# Patient Record
Sex: Female | Born: 1940
Health system: Southern US, Community
[De-identification: ages and names within clinical notes are randomized; demographics above are authoritative.]

## PROBLEM LIST (undated history)

## (undated) DIAGNOSIS — I499 Cardiac arrhythmia, unspecified: Secondary | ICD-10-CM

## (undated) DIAGNOSIS — I5043 Acute on chronic combined systolic (congestive) and diastolic (congestive) heart failure: Secondary | ICD-10-CM

## (undated) DIAGNOSIS — I420 Dilated cardiomyopathy: Secondary | ICD-10-CM

## (undated) DIAGNOSIS — I4891 Unspecified atrial fibrillation: Secondary | ICD-10-CM

## (undated) DIAGNOSIS — I6529 Occlusion and stenosis of unspecified carotid artery: Secondary | ICD-10-CM

## (undated) DIAGNOSIS — E785 Hyperlipidemia, unspecified: Secondary | ICD-10-CM

## (undated) HISTORY — DX: Cardiac arrhythmia, unspecified: I49.9

## (undated) HISTORY — PX: ABDOMINAL HERNIA REPAIR: SHX539

## (undated) HISTORY — DX: Unspecified atrial fibrillation: I48.91

## (undated) HISTORY — PX: CATARACT EXTRACTION: SUR2

## (undated) HISTORY — DX: Dilated cardiomyopathy: I42.0

## (undated) HISTORY — DX: Acute on chronic combined systolic (congestive) and diastolic (congestive) heart failure: I50.43

## (undated) HISTORY — DX: Hyperlipidemia, unspecified: E78.5

## (undated) HISTORY — DX: Occlusion and stenosis of unspecified carotid artery: I65.29

---

## 1999-05-15 ENCOUNTER — Emergency Department (HOSPITAL_COMMUNITY): Admission: EM | Admit: 1999-05-15 | Discharge: 1999-05-16 | Payer: Self-pay

## 2006-03-23 ENCOUNTER — Encounter (INDEPENDENT_AMBULATORY_CARE_PROVIDER_SITE_OTHER): Payer: Self-pay | Admitting: Interventional Cardiology

## 2006-03-23 ENCOUNTER — Ambulatory Visit (HOSPITAL_COMMUNITY): Admission: RE | Admit: 2006-03-23 | Discharge: 2006-03-23 | Payer: Self-pay | Admitting: Interventional Cardiology

## 2006-05-31 ENCOUNTER — Encounter: Admission: RE | Admit: 2006-05-31 | Discharge: 2006-05-31 | Payer: Self-pay | Admitting: *Deleted

## 2007-03-07 ENCOUNTER — Ambulatory Visit: Payer: Self-pay | Admitting: *Deleted

## 2007-04-15 ENCOUNTER — Ambulatory Visit: Admission: RE | Admit: 2007-04-15 | Discharge: 2007-04-15 | Payer: Self-pay | Admitting: *Deleted

## 2007-05-08 ENCOUNTER — Inpatient Hospital Stay (HOSPITAL_COMMUNITY): Admission: RE | Admit: 2007-05-08 | Discharge: 2007-05-09 | Payer: Self-pay | Admitting: *Deleted

## 2007-05-08 ENCOUNTER — Encounter (INDEPENDENT_AMBULATORY_CARE_PROVIDER_SITE_OTHER): Payer: Self-pay | Admitting: *Deleted

## 2007-05-08 ENCOUNTER — Ambulatory Visit: Payer: Self-pay | Admitting: *Deleted

## 2007-05-08 HISTORY — PX: CAROTID ENDARTERECTOMY: SUR193

## 2007-05-23 ENCOUNTER — Ambulatory Visit: Payer: Self-pay | Admitting: *Deleted

## 2007-12-26 ENCOUNTER — Ambulatory Visit: Payer: Self-pay | Admitting: *Deleted

## 2008-06-11 ENCOUNTER — Ambulatory Visit: Payer: Self-pay | Admitting: *Deleted

## 2009-06-13 IMAGING — CR DG CHEST 2V
2 series · 2 of 2 positions shown · non-contrast
Comparison: No prior.

CLINICAL DATA: Preop for left carotid endarterectomy.  Hypertension.  Diabetes.
 CHEST ? 2 VIEW:

[view not recorded (1 of 2)]
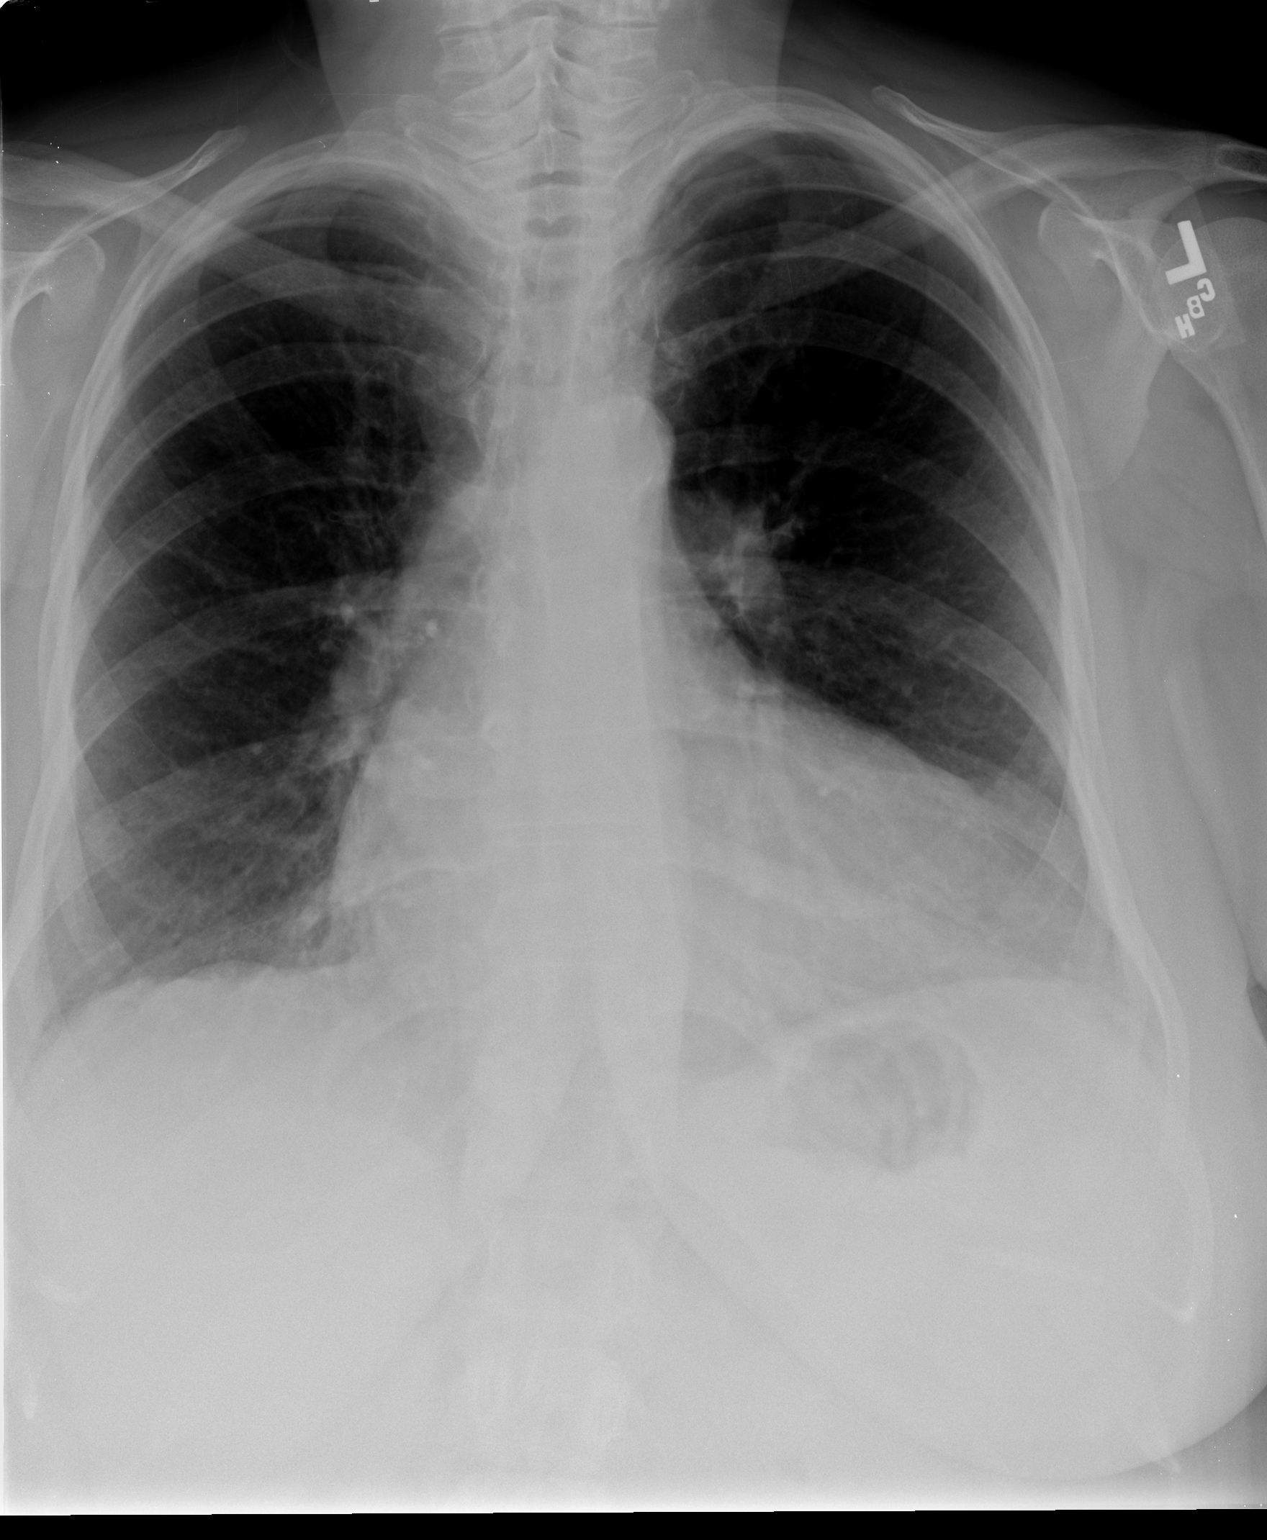

[view not recorded (2 of 2)]
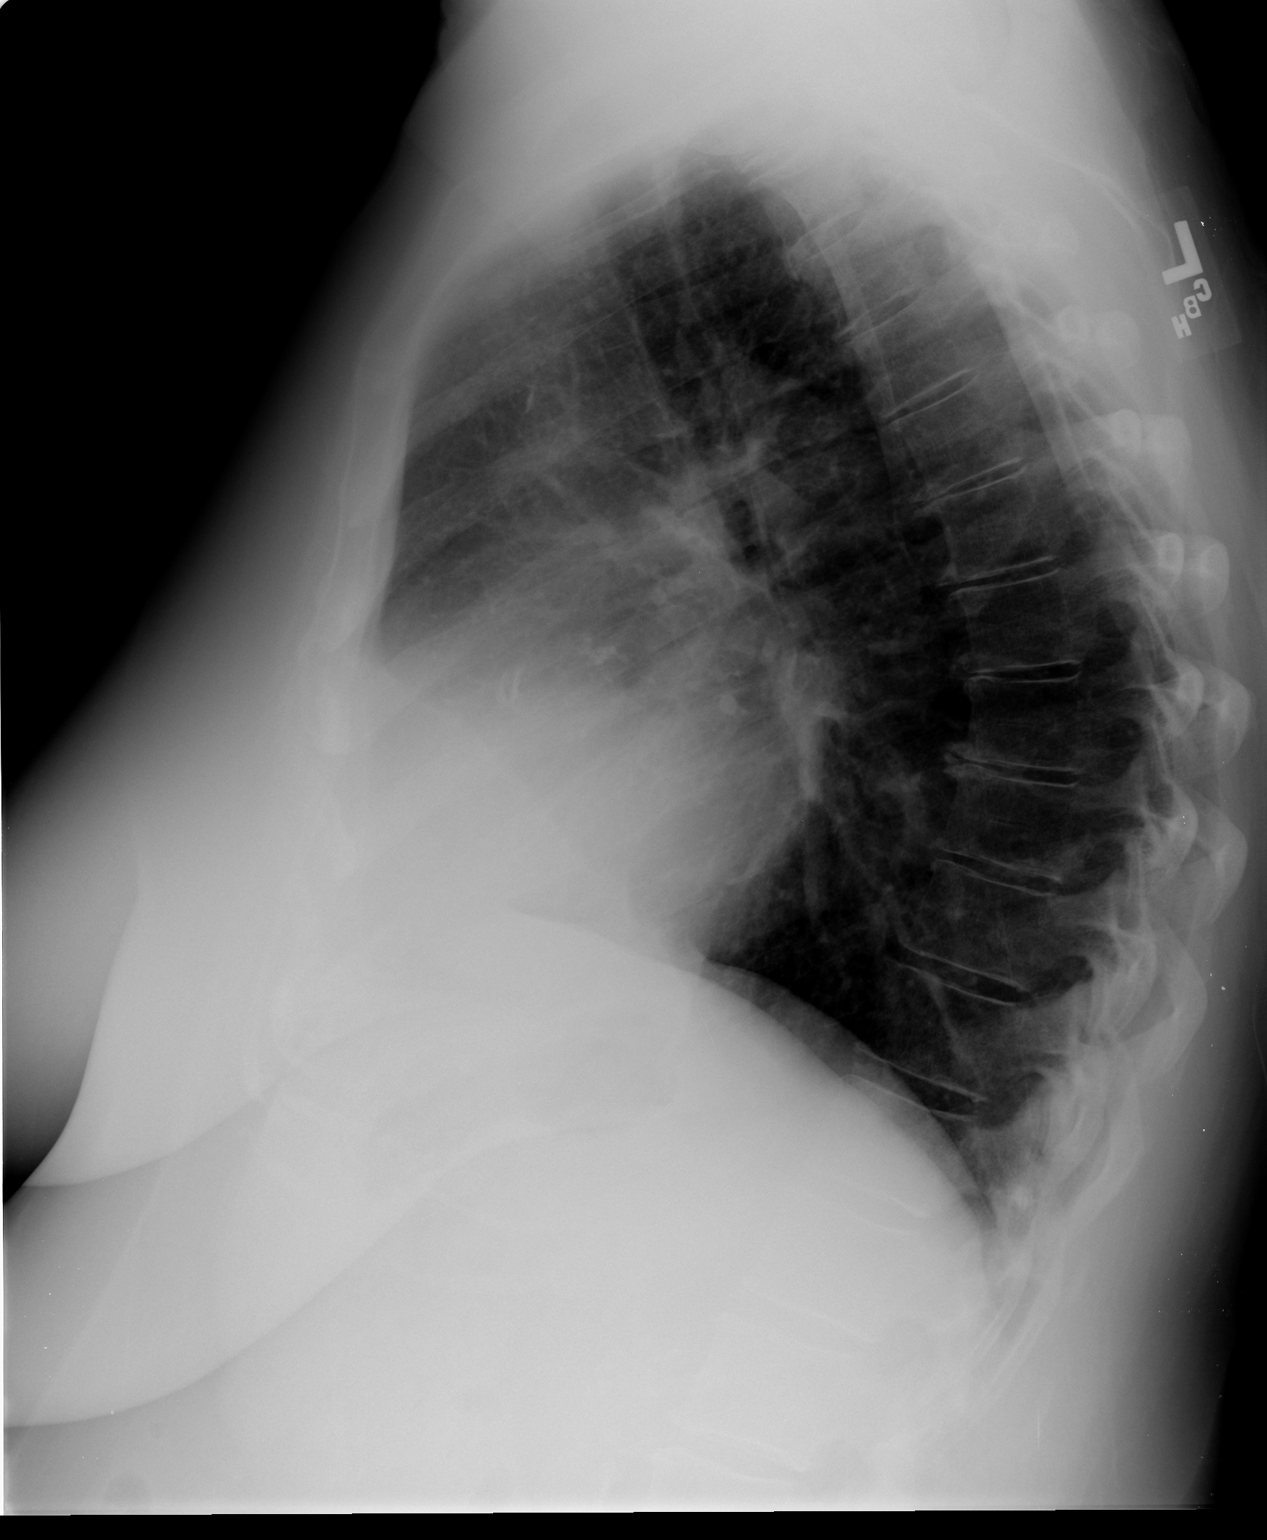

[2 of 2 positions shown; findings below may reference images not displayed]

FINDINGS: Two views of the chest show the lungs to be clear.  Moderate cardiomegaly is present.  No acute bony abnormality is seen.
IMPRESSION: 1. Moderate cardiomegaly.
 2. No active lung disease.

## 2009-07-02 ENCOUNTER — Ambulatory Visit: Payer: Self-pay | Admitting: Vascular Surgery

## 2009-12-26 ENCOUNTER — Emergency Department (HOSPITAL_COMMUNITY)
Admission: EM | Admit: 2009-12-26 | Discharge: 2009-12-26 | Payer: Self-pay | Source: Home / Self Care | Admitting: Emergency Medicine

## 2010-02-03 ENCOUNTER — Encounter: Admission: RE | Admit: 2010-02-03 | Discharge: 2010-02-03 | Payer: Self-pay | Admitting: Orthopedic Surgery

## 2010-07-01 ENCOUNTER — Ambulatory Visit: Admit: 2010-07-01 | Payer: Self-pay

## 2010-07-01 ENCOUNTER — Ambulatory Visit: Admit: 2010-07-01 | Payer: Self-pay | Admitting: Vascular Surgery

## 2010-07-28 ENCOUNTER — Other Ambulatory Visit (INDEPENDENT_AMBULATORY_CARE_PROVIDER_SITE_OTHER): Payer: PRIVATE HEALTH INSURANCE

## 2010-07-28 ENCOUNTER — Ambulatory Visit (INDEPENDENT_AMBULATORY_CARE_PROVIDER_SITE_OTHER): Payer: PRIVATE HEALTH INSURANCE

## 2010-07-28 DIAGNOSIS — I6529 Occlusion and stenosis of unspecified carotid artery: Secondary | ICD-10-CM

## 2010-07-28 DIAGNOSIS — Z48812 Encounter for surgical aftercare following surgery on the circulatory system: Secondary | ICD-10-CM
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
## 2010-08-02 NOTE — Procedures (Unsigned)
CAROTID DUPLEX EXAM  INDICATION:  Carotid disease.  HISTORY: Diabetes:  Yes. Cardiac:  No. Hypertension:  Yes. Smoking:  Yes. Previous Surgery:  Left carotid endarterectomy, 05/08/2007. CV History:  Asymptomatic. Amaurosis Fugax No, Paresthesias No, Hemiparesis No.                                      RIGHT             LEFT Brachial systolic pressure:         140               143 Brachial Doppler waveforms:         Normal            Normal Vertebral direction of flow:        Antegrade         Antegrade DUPLEX VELOCITIES (cm/sec) CCA peak systolic                   67                69 ECA peak systolic                   110               96 ICA peak systolic                   161               53 ICA end diastolic                   41                24 PLAQUE MORPHOLOGY:                  Calcific          Heterogenous PLAQUE AMOUNT:                      Moderate          Mild PLAQUE LOCATION:                    ICA, ECA, CCA     ICA, CCA  IMPRESSION: 1. Right internal carotid artery velocities suggest 40% to 59%     stenosis. 2. Patent left carotid endarterectomy site with no internal carotid     artery stenosis. 3. No significant change from the previous examination.  ___________________________________________ Rosetta Posner, M.D.  EM/MEDQ  D:  07/28/2010  T:  07/28/2010  Job:  [id number]

## 2010-08-20 LAB — APTT: aPTT: 42 seconds — ABNORMAL HIGH (ref 24–37)

## 2010-08-20 LAB — POCT I-STAT, CHEM 8
BUN: 9 mg/dL (ref 6–23)
Calcium, Ion: 1.12 mmol/L (ref 1.12–1.32)
TCO2: 28 mmol/L (ref 0–100)

## 2010-08-20 LAB — PROTIME-INR: INR: 1.94 — ABNORMAL HIGH (ref 0.00–1.49)

## 2010-10-18 NOTE — Assessment & Plan Note (Signed)
OFFICE VISIT   Cynthia Hansen, Cynthia Hansen  DOB:  1940/08/23                                       05/23/2007  [id number]   The patient is post-op left carotid endarterectomy carried out  05/08/2007 [facility].  Doing well without major complaints.  Mild numbness in the left side of her neck around her incision.  Occasional left-sided headache.  No visual disturbance.  No sensory  motor deficit.  Swallowing fine.   BP 139/72, pulse 91 per minute and irregular.   Cranial nerves intact.  Strength equal bilaterally.  No carotid bruits.   Moderate disease noted on the right side by Doppler.  Plan to return in  6 months for a repeat Doppler.   Dorothea Glassman, M.D.  Electronically Signed   PGH/MEDQ  D:  05/23/2007  T:  05/24/2007  Job:  [id number]   cc:   Jettie Booze, MD  Shaune Pollack, NP

## 2010-10-18 NOTE — Procedures (Signed)
CAROTID DUPLEX EXAM   INDICATION:  Followup evaluation of known carotid artery disease.   HISTORY:  Diabetes:  Yes.  Cardiac:  Yes.  Hypertension:  Yes.  Smoking:  The patient rarely smokes.  Previous Surgery:  Left carotid endarterectomy with Dacron patch  angioplasty on 05/08/2007 by Dr. Amedeo Plenty.  CV History:  The patient claims to have trigeminal neuralgia and also  reports some residual left facial numbness.  Amaurosis Fugax No, Paresthesias Yes, Hemiparesis No                                       RIGHT             LEFT  Brachial systolic pressure:         138               140  Brachial Doppler waveforms:         Triphasic         Triphasic  Vertebral direction of flow:        Antegrade         Inaudible  DUPLEX VELOCITIES (cm/sec)  CCA peak systolic                   88                69  ECA peak systolic                   121               [id number]  ICA peak systolic                   146               59  ICA end diastolic                   45                16  PLAQUE MORPHOLOGY:                  Calcified         None  PLAQUE AMOUNT:                      Mild to moderate  None  PLAQUE LOCATION:                    Proximal ICA      None   IMPRESSION:  1. 40-59% right ICA stenosis.  2. No left ICA stenosis status post endarterectomy.  3. Occluded left vertebral artery.   ___________________________________________  P. Drucie Opitz, M.D.   MC/MEDQ  D:  12/26/2007  T:  12/26/2007  Job:  [id number]

## 2010-10-18 NOTE — Op Note (Signed)
NAMEMELANIA, Hansen NO.:  [id number]   MEDICAL RECORD NO.:  [id number]          PATIENT TYPE:  INP   LOCATION:  3302                         FACILITY:  [place]   PHYSICIAN:  Dorothea Glassman, M.D.    DATE OF BIRTH:  14-Aug-1940   DATE OF PROCEDURE:  05/08/2007  DATE OF DISCHARGE:                               OPERATIVE REPORT   SURGEON:  Dorothea Glassman, MD   ASSISTANT:  Jacinta Shoe, P.A.   ANESTHETIC:  General endotracheal.   PREOPERATIVE DIAGNOSIS:  Severe left internal carotid artery stenosis.   POSTOPERATIVE DIAGNOSIS:  Severe left internal carotid artery stenosis.   PROCEDURE:  Left carotid endarterectomy and Dacron patch angioplasty.   CLINICAL NOTE:  Cynthia Hansen is a 70 year old female with a history of  type 2 diabetes and tobacco use.  Known left carotid disease followed in  the [facility] office.  A recent Doppler revealed marked elevation of  velocities, the circuit system with greater than 80% left ICA stenosis.  The patient seen and evaluated, consented to left carotid endarterectomy  for reduction of stroke risk.  Risks and benefits of the operative  procedure explained in detail with a major morbidity and mortality of 1-  2% to include but not limited MI, CVA, cranial nerve injury and death.   OPERATIVE PROCEDURE:  The patient brought to the operating room in  stable hemodynamic condition.  Placed supine position.  General  endotracheal anesthesia induced.  A Foley catheter and arterial line in  place.  Left neck prepped and draped in a sterile fashion.   A left neck incision was made along the anterior border of the  sternomastoid muscle.  Dissection carried down through subcutaneous  tissue.  Platysma divided with electrocautery.  Deep dissection carried  along the anterior border of the sternomastoid down to the carotid  sheath.  The facial vein ligated with 2-0 silk and divided.  The carotid  bifurcation exposed.  Common carotid artery  mobilized down to the  omohyoid muscle and encircled with a vessel loop.  The vagus nerve  reflected posteriorly and preserved.  The superior thyroid and external  carotid were freed and encircled with vessel loops.  The internal  carotid artery followed distally.  There was extensive plaque in the  internal carotid artery, this extended well up into the neck.  This  extended above the level of the posterior belly of the digastric muscle  and also above the hypoglossal nerve, which was preserved and mobilized.  The distal internal carotid artery encircled with a vessel loop.   The patient administered 7000 units of heparin intravenously.  Adequate  circulation time permitted.  The carotid vessels were controlled with  clamps.  A longitudinal arteriotomy made in the distal common carotid  artery.  The arteriotomy extended across the carotid bulb and up into  the internal carotid artery.  A long internal carotid artery arteriotomy  was required to extend beyond the plaque.  There was a long tongue of  plaque extending posteriorly in the internal carotid artery.  A shunt  was inserted.  An endarterectomy elevator used to raise the plaque and the plaque  raised down into the common carotid artery, where it was divided  transversely with Potts scissors.  The plaque then raised up into the  bulb and the superior thyroid and external carotid were  endarterectomized using an eversion technique.  The distal internal  carotid artery plaque feathered out well.  Fragments of plaque were  removed with fine forceps.  The site irrigated with heparin saline and  dextran solution.  A patch angioplasty of the endarterectomy site was  carried out with a Finesse Dacron patch using running 6-0 Prolene  suture.  At completion of the patch angioplasty the shunt was removed,  all vessels well-flushed.  Clamps were removed, directing the initial  antegrade flow up the external carotid artery, following this  the  internal carotid was released.   Excellent pulse and Doppler signal in the distal internal carotid  artery.  The patient administered 50 mg of protamine intravenously.  Adequate hemostasis obtained.  Sponges and instrument counts correct.   Sternomastoid fascia closed with a running 2-0 Vicryl suture.  Platysma  closed with running 3-0 Vicryl suture.  Skin closed with 4-0 Monocryl.  Dermabond applied.   Anesthesia reversed the operating room.  The patient awakened readily.  Moved all extremities to command.  No apparent complications.  Transferred to the recovery room in stable condition.      Dorothea Glassman, M.D.  Electronically Signed     PGH/MEDQ  D:  05/08/2007  T:  05/09/2007  Job:  [id number]   cc:   Jettie Booze, MD

## 2010-10-18 NOTE — H&P (Signed)
NAMEODELL, BROCCOLI NO.:  [id number]   MEDICAL RECORD NO.:  [id number]          PATIENT TYPE:  INP   LOCATION:  2899                         FACILITY:  [place]   PHYSICIAN:  Dorothea Glassman, M.D.    DATE OF BIRTH:  1940-10-01   DATE OF ADMISSION:  05/08/2007  DATE OF DISCHARGE:                              HISTORY & PHYSICAL   CARDIOLOGIST:  Jettie Booze, MD.   PRIMARY CARE:  Shaune Pollack, nurse practitioner at [facility].   ADMISSION DIAGNOSIS:  Severe left internal carotid artery stenosis.   HISTORY OF PRESENT ILLNESS:  Cynthia Hansen is a 70 year old female with  known carotid artery disease, initially seen in consultation  approximately one year ago.  She has been followed in the office on a  six-month basis with carotid Doppler evaluation.  Most recent visit  revealed severe elevation of left internal carotid artery velocities at  413/128 cm/sec.  These were consistent with a greater-than-80% left ICA  stenosis.  Right internal carotid artery reveals a 40% to 59% stenosis.   The patient denies any symptoms of neurologic origin.  No sensorimotor  or visual deficit.  No speech problems.  No gait abnormality.  No  syncope or presyncope.   PAST MEDICAL HISTORY:  1. Paroxysmal atrial fibrillation with recent recurrence following      cardioversion in October 2007.  2. Type 2 diabetes.  3. Hypertension.  4. Hyperlipidemia.  5. Tobacco abuse.   MEDICATIONS:  1. Coumadin 1.25 mg on Monday, Wednesday, Friday, Saturday; 2.5 mg on      Tuesday, Thursday, Sunday.  2. Metformin 1 g b.i.d.  3. Cartia XT 180 mg daily.  4. Zetia 10 mg daily.  5. Crestor 10 mg daily.   ALLERGIES:  NONE KNOWN   SOCIAL HISTORY:  The patient is married with one child.  She works as a  Research scientist (physical sciences).  She smokes one pack cigarettes daily.  Does not consume  alcohol on a regular basis.   FAMILY HISTORY:  Father died at age 18 of an MI with a history  of  hyperlipidemia.   REVIEW OF SYSTEMS:  The patient denies recent chest pain or shortness of  breath.  She has had palpitations and irregular heartbeat.  Notes no  cough or sputum production.  No recent weight loss.  No anorexia,  nausea, vomiting.  No diarrhea or constipation.  No dysuria or  frequency.  No joint abnormalities.  No skin rashes or ulcers.   PHYSICAL EXAMINATION:  GENERAL:  Moderately obese, 70 year old female.  Alert and oriented.  No acute distress.  VITAL SIGNS:  BP 150/80, pulse 70 per minute and irregular, respirations  rate 10 per minute, temperature 98.7.  HEENT:  Mouth and throat clear.  Normocephalic.  NECK:  Supple.  No thyromegaly or adenopathy.  CHEST:  Clear with equal air entry bilaterally.  CARDIOVASCULAR:  Left carotid bruit.  Normal heart sounds without  murmurs.  Irregular rhythm.  ABDOMEN:  Soft and nontender.  No mass or  organomegaly.  Normal bowel sounds, no bruits.  No tenderness.  NEUROLOGIC:  Cranial nerves intact.  Strength equal bilaterally.  There  are 2+ reflexes.  SKIN:  No rashes or ulceration.   IMPRESSION:  1. Severe asymptomatic left internal carotid artery stenosis with      recent progression.  2. Hypertension.  3. Hyperlipidemia  4. Type 2 diabetes.  5. Paroxysmal atrial fibrillation.  6. Tobacco abuse.   PLAN:  The patient will be admitted to the hospital electively on  May 08, 2007, for left carotid endarterectomy.  The risks and  benefits of the operative procedure were explained to the patient in  detail, as well as that major morbidity and mortality associated with  this procedure was 1% to 2%, to include but not limited to MI, CVA,  cranial nerve injury, and death.      Dorothea Glassman, M.D.  Electronically Signed     PGH/MEDQ  D:  05/08/2007  T:  05/08/2007  Job:  [id number]   cc:   Jettie Booze, MD  Shaune Pollack, nurse practitioner

## 2010-10-18 NOTE — Discharge Summary (Signed)
[name], [name]              ACCOUNT NO.:  [id number]   MEDICAL RECORD NO.:  [id number]          PATIENT TYPE:  INP   LOCATION:  3302                         FACILITY:  [facility]   PHYSICIAN:  Dorothea Glassman, M.D.    DATE OF BIRTH:  Mar 20, 1941   DATE OF ADMISSION:  05/08/2007  DATE OF DISCHARGE:  05/09/2007                               DISCHARGE SUMMARY   ADMISSION DIAGNOSIS:  Severe left internal carotid arterial stenosis.   DISCHARGE DIAGNOSIS:  1. Severe left internal carotid arterial stenosis, asymptomatic.  2. Paroxysmal atrial fibrillation with recent recurrence following      cardioversion in October 2007.  3. Type 2 diabetes.  4. Hypertension.  5. Hyperlipidemia.  6. Tobacco use.   CONSENT:  None.   PROCEDURES:  Left carotid endarterectomy with Dacron patch done by Dr.  Amedeo Plenty on May 08, 2007.   BRIEF HISTORY:  Cynthia Hansen is a 70 year old female with known  coronary artery disease, initially seen in consultation approximately 1  year ago.  She has been followed in the office on a 75-month basis with  carotid Doppler evaluation.  Most recent visit revealed severe elevation  of left internal carotid artery velocities of at 413/128 cm/sec.  These  were consistent with a greater than 80% left internal carotid stenosis.  Right internal carotid artery reveals a 40 to 59% stenosis.  The patient  denied any symptoms in neurological origin.  No sensory, motor or visual  defect.  No speech problems.  No gait abnormality.  No syncope or  presyncope. The plan was for the patient to be admitted to the hospital  electively on May 08, 2007, for left carotid endarterectomy. The  risks and benefits of the operative procedure was explained to the  patient in detail as well as the majority morbidity and mortality  associated with the procedure was 1 to 2% to include but not limited to  MI, CVA, cranial nerve injury and death.  The patient agreed and  understood these risks  and wanted to proceed with the procedure. The  patient signed consent and went through with the procedure.  The patient  tolerated the procedure.   HOSPITAL COURSE:  On May 08, 2007, the patient was taken to the  operating room to repair her left carotid stenosis.  The patient  tolerated the procedure well and was transferred to PACU in stable  condition.  After the patient remained in PACU for several hours, the  patient as transferred to 3300 where she continued to remain stable  overnight.  Postoperative day 2 on May 09, 2007, the patient  continued to remain stable.  Her vitals were 157/69, heart rate was 94,  respirations 17, temperature 98.3, all labs within normal range.  The  patient had no complaints.  On physical examination per Dr. Amedeo Plenty, the  patient was alert and oriented.  There was no left-sided weakness.  Neurologically was intact and the plan to have the patient discharged  home later today. The patient agreed with this plan.   DISCHARGE PLAN:  Plan is indicated for discharge of May 10, 2007, as  long as the patient continues to remain afebrile and currently vitals  stable.  The patient is in agreement with this plan to go home today.   DISCHARGE MEDICATIONS:  1. Coumadin as directed.  2. Aspirin 81 mg daily.  3. Zetia 10 mg daily.  4. Metformin 1000 mg daily times 2,  5. Actos 15 mg daily.  6. Metoprolol 50 mg daily.  7. Crestor 10 mg daily.  8. Pravastatin 20 mg daily.  9. Oxycodone 5 gm 1 to 2 tablets every 4 hours as needed for pain.   DISCHARGE FOLLOWUP:  The patient is to be discharged on May 09, 2007, and follow up in office with Dr. Amedeo Plenty in 2 weeks.  The patient is  to continue to follow a diabetic diet.  The patient is to clean  incisions gently with soap and water. The patient is instructed to  increase activity slowly.  May shower or bathe on May 10, 2007.  The  patient is also encouraged to do no lifting or driving for 2 weeks.   The  patient is to call office or ER if fever greater than 101 any redness,  drainage from incision site, severe headache, visual or speech change.  The patient has been instructed to continue medications per home  medication list including Coumadin.  The patient is also directed that  have oxycodone 5 mg 1 to 2 tablets every 4 hours as needed for pain.  The patient agreed to these follow-up care instructions and will be  discharged to home on May 10, 2007, as long as she continued to  remain stable.   Discharge on May 10, 2007, indicated as long as the patient  continues to remain stable and afebrile.      Darlin Coco, PA      P. Drucie Opitz, M.D.  Electronically Signed    ALW/MEDQ  D:  05/09/2007  T:  05/09/2007  Job:  [id number]

## 2010-10-18 NOTE — Procedures (Signed)
CAROTID DUPLEX EXAM   INDICATION:  Follow up known carotid artery disease, per CT.   HISTORY:  Diabetes:  Yes.  Cardiac:  Yes.  Hypertension:  Yes.  Smoking:  Yes.  Previous Surgery:  CV History:  Amaurosis Fugax Yes No, Paresthesias Yes No, Hemiparesis Yes No                                       RIGHT             LEFT  Brachial systolic pressure:         150               148  Brachial Doppler waveforms:         Biphasic          Biphasic  Vertebral direction of flow:        Antegrade         Not well  visualized  DUPLEX VELOCITIES (cm/sec)  CCA peak systolic                   114               58  ECA peak systolic                   131               [id number]  ICA peak systolic                   182               [id number]  ICA end diastolic                   49                128  PLAQUE MORPHOLOGY:                  Calcified         Calcified  PLAQUE AMOUNT:                      Moderate          Severe  PLAQUE LOCATION:                    ICA, ECA          ICA, ECA   IMPRESSION:  1. An 80-99% stenosis noted in the left internal carotid artery.  2. A 40-59% stenosis noted in the right internal carotid artery.  3. Antegrade right vertebral artery.   ___________________________________________  P. Drucie Opitz, M.D.   MG/MEDQ  D:  03/07/2007  T:  03/08/2007  Job:  [id number]

## 2010-10-18 NOTE — Procedures (Signed)
CAROTID DUPLEX EXAM   INDICATION:  Follow-up left carotid endarterectomy and known right  carotid disease.   HISTORY:  Diabetes:  Yes.  Cardiac:  Yes.  Hypertension:  Yes.  Smoking:  Yes.  Previous Surgery:  Left carotid endarterectomy in 05/08/2007.  CV History:  Amaurosis Fugax No, Paresthesias No, Hemiparesis No.                                       RIGHT             LEFT  Brachial systolic pressure:         140               140  Brachial Doppler waveforms:         Biphasic          Biphasic  Vertebral direction of flow:        Antegrade         Not well  visualized  DUPLEX VELOCITIES (cm/sec)  CCA peak systolic                   76                79  ECA peak systolic                   150               [id number]  ICA peak systolic                   146               65  ICA end diastolic                   27                15  PLAQUE MORPHOLOGY:                  Heterogeneous     None  PLAQUE AMOUNT:                      Moderate          None  PLAQUE LOCATION:                    ICA and ECA       ECA   IMPRESSION:  1. A 40-59% stenosis noted in the right ICA.  2. Normal carotid duplex noted in the left ICA.  Status post left      carotid endarterectomy.  3. Antegrade right vertebral artery.   ___________________________________________  P. Drucie Opitz, M.D.   MG/MEDQ  D:  06/11/2008  T:  06/11/2008  Job:  [id number]   cc:   Dorothea Glassman, M.D.

## 2010-10-18 NOTE — Procedures (Signed)
CAROTID DUPLEX EXAM   INDICATION:  Carotid disease.   HISTORY:  Diabetes:  Yes.  Cardiac:  Cath.  Hypertension:  Yes.  Smoking:  Yes.  Previous Surgery:  Left carotid endarterectomy on 05/08/07.  CV History:  Currently asymptomatic.  Amaurosis Fugax No, Paresthesias No, Hemiparesis No.                                       RIGHT             LEFT  Brachial systolic pressure:         150               148  Brachial Doppler waveforms:         Normal            Normal  Vertebral direction of flow:        Antegrade         Not visualized  DUPLEX VELOCITIES (cm/sec)  CCA peak systolic                   92                90  ECA peak systolic                   156               [id number]  ICA peak systolic                   173               98  ICA end diastolic                   45                26  PLAQUE MORPHOLOGY:                  Mixed             Heterogenous  PLAQUE AMOUNT:                      Moderate          Mild  PLAQUE LOCATION:                    ICA/ECA/CCA       ICA/CCA   IMPRESSION:  1. 40-59% stenosis of the right internal carotid artery.  2. Patent carotid endarterectomy site with no left internal carotid      artery stenosis.  3. No significant change noted when compared to the previous      examination on 06/11/08.   ___________________________________________  Rosetta Posner, M.D.   CH/MEDQ  D:  07/02/2009  T:  07/02/2009  Job:  [id number]

## 2010-10-18 NOTE — Assessment & Plan Note (Signed)
OFFICE VISIT   Cynthia Hansen, Cynthia Hansen  DOB:  [birth year]                                       03/07/2007  [id number]   The patient returned to the office today for surveillance of her carotid  artery disease.  Carotid Doppler evaluation carried out today.  This  reveals significant progression of her left internal carotid artery  stenosis.  Velocities are 413/128 cm per second, consistent with a  greater than 80% left ICA stenosis.  The right ICA reveals a 40-59%  stenosis.   The patient denies any symptoms.  No motor, visual, or sensory deficit.  No speech problems.   BP is 150/90 bilaterally, pulse 95 per minute and regular, temperature  97.9.   The patient appears well.  No cranial nerve abnormalities.  Strength  equal bilaterally.  2+ reflexes.   Will need a left carotid endarterectomy for reduction of stroke risk due  to progressive left internal carotid artery stenosis.  Recent recurrence  of atrial fibrillation with irregular left heartbeat evident.  I have  asked her to contact Dr. Irish Lack about this.   The patient would like to wait until the new year to have her surgery.  Provided she remains asymptomatic, I think this is reasonable.  I have  instructed her regarding the onset of any symptoms, including motor,  sensory, visual, or speech abnormalities, to contact me immediately.  Will plan on revisit to the office in January with planned potential  surgery, left carotid endarterectomy.   Dorothea Glassman, M.D.  Electronically Signed   PGH/MEDQ  D:  03/07/2007  T:  03/08/2007  Job:  [id number]   cc:   Jettie Booze, MD  Shaune Pollack, NP

## 2010-10-21 NOTE — Op Note (Signed)
NAMEKYLINN, DILULLO NO.:  [id number]   MEDICAL RECORD NO.:  [id number]          PATIENT TYPE:  AMB   LOCATION:  ENDO                         FACILITY:  [facility]   PHYSICIAN:  Jettie Booze, MDDATE OF BIRTH:  [birth year]   DATE OF PROCEDURE:  03/23/2006  DATE OF DISCHARGE:                                 OPERATIVE REPORT   REFERRING PHYSICIAN:  Dr. Shaune Pollack, [place], at [facility].   PROCEDURES PERFORMED:  1. Transesophageal echocardiogram.  2. Direct current cardioversion.   INDICATIONS FOR PROCEDURE:  Atrial fibrillation.   PROCEDURE NARRATIVE:  The risks and benefits of TEE and cardioversion were  explained to the patient and informed consent was obtained.  A  transesophageal echo revealed no left atrial thrombus.  Defibrillation pads  were then placed on her anterior chest wall and back.  Then 200 mg of  Pentothal was administered by Dr. Maryjean Morn of anesthesia.  A 100-joule biphasic  shock was administered without successful restoration of normal sinus  rhythm.  A 120-joule shock was then administered, which was successful at  restoring normal sinus rhythm.  The patient tolerated the procedure well.   ANESTHESIA:  Dr. Al Corpus, 200 mg of Pentothal.   RECOMMENDATIONS:  The patient is currently in normal sinus rhythm.  I would  continue Coumadin for anticoagulation.  Will also continue Cartia 180 mg XT.  I will follow up with the patient in the office.      Jettie Booze, MD  Electronically Signed     JSV/MEDQ  D:  03/23/2006  T:  03/24/2006  Job:  [id number]

## 2010-11-09 ENCOUNTER — Other Ambulatory Visit: Payer: Self-pay | Admitting: Oncology

## 2010-11-09 ENCOUNTER — Encounter (HOSPITAL_BASED_OUTPATIENT_CLINIC_OR_DEPARTMENT_OTHER): Payer: PRIVATE HEALTH INSURANCE | Admitting: Hematology and Oncology

## 2010-11-09 DIAGNOSIS — I4891 Unspecified atrial fibrillation: Secondary | ICD-10-CM

## 2010-11-09 DIAGNOSIS — E119 Type 2 diabetes mellitus without complications: Secondary | ICD-10-CM

## 2010-11-09 DIAGNOSIS — D509 Iron deficiency anemia, unspecified: Secondary | ICD-10-CM

## 2010-11-09 DIAGNOSIS — E785 Hyperlipidemia, unspecified: Secondary | ICD-10-CM

## 2010-11-09 LAB — CBC & DIFF AND RETIC
BASO%: 0.2 % (ref 0.0–2.0)
EOS%: 0.9 % (ref 0.0–7.0)
HCT: 29.4 % — ABNORMAL LOW (ref 34.8–46.6)
LYMPH%: 20.8 % (ref 14.0–49.7)
MCH: 24.6 pg — ABNORMAL LOW (ref 25.1–34.0)
MCHC: 28.9 g/dL — ABNORMAL LOW (ref 31.5–36.0)
MCV: 85 fL (ref 79.5–101.0)
MONO%: 8.9 % (ref 0.0–14.0)
NEUT%: 69.2 % (ref 38.4–76.8)
lymph#: 1.8 10*3/uL (ref 0.9–3.3)

## 2010-11-09 LAB — COMPREHENSIVE METABOLIC PANEL
ALT: 9 U/L (ref 0–35)
AST: 8 U/L (ref 0–37)
Albumin: 4.1 g/dL (ref 3.5–5.2)
Calcium: 9.2 mg/dL (ref 8.4–10.5)
Chloride: 104 mEq/L (ref 96–112)
Potassium: 3.4 mEq/L — ABNORMAL LOW (ref 3.5–5.3)
Sodium: 139 mEq/L (ref 135–145)
Total Protein: 6.5 g/dL (ref 6.0–8.3)

## 2010-11-09 LAB — RETICULOCYTES (CHCC)
ABS Retic: 78.7 10*3/uL (ref 19.0–186.0)
RBC.: 3.42 MIL/uL — ABNORMAL LOW (ref 3.87–5.11)

## 2010-11-09 LAB — MORPHOLOGY

## 2010-11-09 LAB — CHCC SMEAR

## 2010-11-11 ENCOUNTER — Encounter (HOSPITAL_BASED_OUTPATIENT_CLINIC_OR_DEPARTMENT_OTHER): Payer: PRIVATE HEALTH INSURANCE | Admitting: Oncology

## 2010-11-11 DIAGNOSIS — D509 Iron deficiency anemia, unspecified: Secondary | ICD-10-CM

## 2011-02-10 ENCOUNTER — Encounter (HOSPITAL_BASED_OUTPATIENT_CLINIC_OR_DEPARTMENT_OTHER): Payer: PRIVATE HEALTH INSURANCE | Admitting: Oncology

## 2011-02-10 DIAGNOSIS — D509 Iron deficiency anemia, unspecified: Secondary | ICD-10-CM

## 2011-02-10 DIAGNOSIS — I4891 Unspecified atrial fibrillation: Secondary | ICD-10-CM

## 2011-02-10 DIAGNOSIS — E785 Hyperlipidemia, unspecified: Secondary | ICD-10-CM

## 2011-02-10 DIAGNOSIS — E119 Type 2 diabetes mellitus without complications: Secondary | ICD-10-CM

## 2011-03-13 LAB — URINE MICROSCOPIC-ADD ON

## 2011-03-13 LAB — CBC
HCT: 43.4
Hemoglobin: 14.9
MCV: 89.2
Platelets: 227
RBC: 4.3
WBC: 11.3 — ABNORMAL HIGH
WBC: 7.5

## 2011-03-13 LAB — COMPREHENSIVE METABOLIC PANEL
ALT: 18
Albumin: 3.9
Alkaline Phosphatase: 70
BUN: 9
Chloride: 104
Glucose, Bld: 144 — ABNORMAL HIGH
Potassium: 4.2
Sodium: 137
Total Bilirubin: 0.8

## 2011-03-13 LAB — BASIC METABOLIC PANEL
BUN: 5 — ABNORMAL LOW
Calcium: 8.5
Chloride: 102
Creatinine, Ser: 0.74
GFR calc Af Amer: >60
GFR calc non Af Amer: >60

## 2011-03-13 LAB — URINALYSIS, ROUTINE W REFLEX MICROSCOPIC
Bilirubin Urine: NEGATIVE
Glucose, UA: NEGATIVE
Specific Gravity, Urine: 1.019
Urobilinogen, UA: 0.2

## 2011-03-13 LAB — TYPE AND SCREEN: Antibody Screen: NEGATIVE

## 2011-03-13 LAB — PROTIME-INR: INR: 1

## 2011-03-14 LAB — COMPREHENSIVE METABOLIC PANEL
ALT: 17
AST: 18
CO2: 30
Chloride: 100
Creatinine, Ser: 0.75
GFR calc Af Amer: >60
GFR calc non Af Amer: >60
Sodium: 137
Total Bilirubin: 0.4

## 2011-03-14 LAB — URINALYSIS, ROUTINE W REFLEX MICROSCOPIC
Bilirubin Urine: NEGATIVE
Glucose, UA: NEGATIVE
Ketones, ur: NEGATIVE
pH: 6

## 2011-03-14 LAB — CBC
Hemoglobin: 15
MCV: 88.4
RBC: 5.03
WBC: 9.5

## 2011-03-14 LAB — TYPE AND SCREEN: Antibody Screen: NEGATIVE

## 2011-03-14 LAB — URINE MICROSCOPIC-ADD ON

## 2011-05-11 ENCOUNTER — Encounter: Payer: Self-pay | Admitting: *Deleted

## 2011-05-12 ENCOUNTER — Encounter: Payer: Self-pay | Admitting: Oncology

## 2011-05-12 DIAGNOSIS — I4891 Unspecified atrial fibrillation: Secondary | ICD-10-CM | POA: Insufficient documentation

## 2011-05-12 DIAGNOSIS — E785 Hyperlipidemia, unspecified: Secondary | ICD-10-CM | POA: Insufficient documentation

## 2011-05-12 DIAGNOSIS — D509 Iron deficiency anemia, unspecified: Secondary | ICD-10-CM | POA: Insufficient documentation

## 2011-05-12 DIAGNOSIS — E559 Vitamin D deficiency, unspecified: Secondary | ICD-10-CM | POA: Insufficient documentation

## 2011-05-12 DIAGNOSIS — E119 Type 2 diabetes mellitus without complications: Secondary | ICD-10-CM | POA: Insufficient documentation

## 2011-05-18 ENCOUNTER — Other Ambulatory Visit: Payer: Self-pay | Admitting: Oncology

## 2011-05-19 ENCOUNTER — Ambulatory Visit (HOSPITAL_BASED_OUTPATIENT_CLINIC_OR_DEPARTMENT_OTHER): Payer: PRIVATE HEALTH INSURANCE | Admitting: Oncology

## 2011-05-19 DIAGNOSIS — E785 Hyperlipidemia, unspecified: Secondary | ICD-10-CM

## 2011-05-19 DIAGNOSIS — D509 Iron deficiency anemia, unspecified: Secondary | ICD-10-CM

## 2011-05-19 DIAGNOSIS — D649 Anemia, unspecified: Secondary | ICD-10-CM

## 2011-05-19 DIAGNOSIS — E119 Type 2 diabetes mellitus without complications: Secondary | ICD-10-CM

## 2011-05-19 DIAGNOSIS — I4891 Unspecified atrial fibrillation: Secondary | ICD-10-CM

## 2011-05-19 NOTE — Progress Notes (Signed)
[facility] NOTE  Gerrit Heck, MD  DIAGNOSIS: Iron deficiency anemia from unclear etiology; negative GI work up.   CURRENT THERAPY:  Oral iron and IV Feraheme as needed  INTERVAL HISTORY: Cynthia Hansen 70 y.o. female returns for regular follow up.  She reported feeling much better after the infusions of intravenous Feraheme infusion.  She denies any symptoms of anemia such as syncope, fatigue, vertigo, chest pain, dyspnea exertion. She denies all visible source of bleeding.  Patient denies fatigue, headache, visual changes, confusion, drenching night sweats, palpable lymph node swelling, mucositis, odynophagia, dysphagia, nausea vomiting, jaundice, chest pain, palpitation, shortness of breath, dyspnea on exertion, productive cough, gum bleeding, epistaxis, hematemesis, hemoptysis, abdominal pain, abdominal swelling, early satiety, melena, hematochezia, hematuria, skin rash, spontaneous bleeding, joint swelling, joint pain, heat or cold intolerance, bowel bladder incontinence, back pain, focal motor weakness, paresthesia, depression, suicidal or homocidal ideation, feeling hopelessness.   MEDICAL HISTORY: Past Medical History  Diagnosis Date  . Iron deficiency anemia   . Diabetes mellitus   . Hyperlipidemia   . A-fib   . Vitamin D deficiency     SURGICAL HISTORY: No past surgical history on file.  MEDICATIONS: Current Outpatient Prescriptions  Medication Sig Dispense Refill  . diltiazem (CARDIZEM CD) 180 MG 24 hr capsule Take 180 mg by mouth 2 (two) times daily.        . ferrous sulfate 325 (65 FE) MG tablet Take 325 mg by mouth 2 (two) times daily.        . furosemide (LASIX) 40 MG tablet Take 40 mg by mouth daily.        . MetFORMIN HCl (GLUCOPHAGE XR PO) Take 1,000 mg by mouth 2 (two) times daily.       . Misc Natural Products (OSTEO BI-FLEX ADV TRIPLE ST PO) Take 1 tablet by mouth 2 (two) times daily.        . Multiple Vitamin  (MULTIVITAMIN) tablet Take 1 tablet by mouth daily.        . potassium chloride (KLOR-CON) 20 MEQ packet Take 20 mEq by mouth every Monday, Wednesday, and Friday.        . pravastatin (PRAVACHOL) 40 MG tablet Take 20 mg by mouth daily.       Marland Kitchen warfarin (COUMADIN) 5 MG tablet Take 5 mg by mouth daily. As directed        . KLOR-CON M20 20 MEQ tablet         ALLERGIES:   has no known allergies.  REVIEW OF SYSTEMS:  The rest of the 14-point review of system was negative.   Filed Vitals:   05/19/11 1450  BP: 156/85  Pulse: 64  Temp: 98.1 F (36.7 C)   Wt Readings from Last 3 Encounters:  05/19/11 212 lb 12.8 oz (96.525 kg)  02/10/11 209 lb 14.4 oz (95.21 kg)   ECOG Performance status: 0  PHYSICAL EXAMINATION:   General:  well-nourished in no acute distress.  Eyes:  no scleral icterus.  ENT:  There were no oropharyngeal lesions.  Neck was without thyromegaly.  Lymphatics:  Negative cervical, supraclavicular or axillary adenopathy.  Respiratory: lungs were clear bilaterally without wheezing or crackles.  Cardiovascular:  Irregularly irregular, S1/S2, without murmur, rub or gallop.  There was no pedal edema.  GI:  abdomen was soft, flat, nontender, nondistended, without organomegaly.  Muscoloskeletal:  no spinal tenderness of palpation of vertebral spine.  Skin exam was without echymosis, petichae.  Neuro exam was nonfocal.  Patient was able to get on and off exam table without assistance.  Gait was normal.  Patient was alerted and oriented.  Attention was good.   Language was appropriate.  Mood was normal without depression.  Speech was not pressured.  Thought content was not tangential.     LABORATORY/RADIOLOGY DATA: from PCP office 05/16/2011 WBC 10.2; Hgb 15.6; Plt 309. Cr 0.68; LFT within normal limit. Iron 83; TIBC 431.     ASSESSMENT AND PLAN:   1. Chronic iron deficiency anemia since 2011 with unclear etiology:  This is now resolved. She has normal hemoglobin and she does not  tolerate oral iron very well I recommend that she goes on for at this time.   2. Atrial fibrillation.  She is on rate control diltiazem and stroke prophylaxis with Coumadin per cardiologist. 3. Hyperlipidemia.  She is on pravastatin per PCP. 4. Diabetes type 2.  She is on metformin.  5. Followup:  CBC in 3 and 6 months.  She prefers to get this drawn with her PCP.  I'll see her myself in 9 months.  6. Primary care: She has already had a flu shot this year. She had negative colonoscopy last year. Her last Pap smear has been years ago however it had been negative and she was told that she does not need Pap smear anymore. She has never had a routine screen mammogram. She acknowledges that she perform self breast exam routinely and hasn't found any other 90. Despite discussion of the pros and cons of routine screen mammogram she declined.

## 2011-05-22 ENCOUNTER — Telehealth: Payer: Self-pay | Admitting: Oncology

## 2011-05-22 NOTE — Telephone Encounter (Signed)
S/w the pt and she is aware of her sept 2013 appts

## 2011-06-19 ENCOUNTER — Telehealth: Payer: Self-pay | Admitting: Oncology

## 2011-06-19 NOTE — Telephone Encounter (Signed)
called pt with lab appts for3 and 6 of 2013,mailed to pt as well  aom (per sch flowsheet data)

## 2011-06-26 ENCOUNTER — Telehealth: Payer: Self-pay | Admitting: Oncology

## 2011-06-26 NOTE — Telephone Encounter (Signed)
pt called to cx 9/23 lab as dr ha had given her an order to have it drawn at [facility] in 02/2012 when she goes there for lab/aom

## 2011-08-02 ENCOUNTER — Other Ambulatory Visit (INDEPENDENT_AMBULATORY_CARE_PROVIDER_SITE_OTHER): Payer: PRIVATE HEALTH INSURANCE | Admitting: *Deleted

## 2011-08-02 DIAGNOSIS — I6529 Occlusion and stenosis of unspecified carotid artery: Secondary | ICD-10-CM

## 2011-08-02 DIAGNOSIS — Z48812 Encounter for surgical aftercare following surgery on the circulatory system: Secondary | ICD-10-CM

## 2011-08-03 ENCOUNTER — Other Ambulatory Visit: Payer: PRIVATE HEALTH INSURANCE

## 2011-08-14 ENCOUNTER — Encounter: Payer: Self-pay | Admitting: Vascular Surgery

## 2011-08-14 ENCOUNTER — Other Ambulatory Visit: Payer: Self-pay | Admitting: *Deleted

## 2011-08-14 DIAGNOSIS — I6529 Occlusion and stenosis of unspecified carotid artery: Secondary | ICD-10-CM

## 2011-08-14 DIAGNOSIS — Z48812 Encounter for surgical aftercare following surgery on the circulatory system: Secondary | ICD-10-CM

## 2011-08-14 NOTE — Procedures (Unsigned)
CAROTID DUPLEX EXAM  INDICATION:  Carotid disease.  HISTORY: Diabetes:  Yes. Cardiac:  No. Hypertension:  Yes. Smoking:  Yes. Previous Surgery:  Left carotid endarterectomy on 05/08/2007. CV History:  Currently asymptomatic. Amaurosis Fugax No, Paresthesias No, Hemiparesis No                                      RIGHT             LEFT Brachial systolic pressure:         138               142 Brachial Doppler waveforms:         Normal            Normal Vertebral direction of flow:        Antegrade         Antegrade DUPLEX VELOCITIES (cm/sec) CCA peak systolic                   74                72 ECA peak systolic                   157               [id number] ICA peak systolic                   173               63 ICA end diastolic                   44                17 PLAQUE MORPHOLOGY:                  Heterogeneous     Heterogeneous PLAQUE AMOUNT:                      Moderate          Mild PLAQUE LOCATION:                    ICA/ECA/CCA       CCA/ECA  IMPRESSION: 1. Doppler velocities suggest a 40-59% stenosis of the right internal     carotid artery. 2. Patent left carotid endarterectomy site with no left internal     carotid artery stenosis. 3. No significant change noted when compared to previous exam on     07/28/2010.  ___________________________________________ Jessy Oto. Fields, MD  CH/MEDQ  D:  08/04/2011  T:  08/04/2011  Job:  [id number]

## 2011-08-16 ENCOUNTER — Telehealth: Payer: Self-pay | Admitting: *Deleted

## 2011-08-16 ENCOUNTER — Other Ambulatory Visit: Payer: Self-pay | Admitting: *Deleted

## 2011-08-16 ENCOUNTER — Telehealth: Payer: Self-pay | Admitting: Oncology

## 2011-08-16 NOTE — Telephone Encounter (Signed)
She will need CBC, iron, ferritin.  Thanks.

## 2011-08-16 NOTE — Telephone Encounter (Signed)
Faxed Rx for CBC, Iron and Ferritin to Dr. Irish Lack at [facility] fax [phone].  Attempted to notify pt of Rx for labwork sent, but unable to reach pt at either number.

## 2011-08-16 NOTE — Telephone Encounter (Signed)
Pt left message to cancel her lab appt here this week.  She is having labs done at [facility] next Tuesday and would like [place] to send them Rx for whatever labs Dr. Lamonte Sakai ordered.

## 2011-08-16 NOTE — Telephone Encounter (Signed)
cancelled per pt.  Pt will have labs drawn @ [facility] with Dr. Lacie Draft on 03/19. gv info to [place] to call office to added lab orders

## 2011-08-17 ENCOUNTER — Other Ambulatory Visit: Payer: PRIVATE HEALTH INSURANCE | Admitting: Lab

## 2011-08-25 ENCOUNTER — Encounter: Payer: Self-pay | Admitting: *Deleted

## 2011-08-25 NOTE — Progress Notes (Signed)
Received CBC results from [facility] and forwarded to Dr. Lamonte Sakai for review.

## 2011-11-16 ENCOUNTER — Other Ambulatory Visit: Payer: PRIVATE HEALTH INSURANCE | Admitting: Lab

## 2011-11-20 ENCOUNTER — Encounter: Payer: Self-pay | Admitting: *Deleted

## 2011-11-20 NOTE — Progress Notes (Signed)
Faxed Rx for CBC to [facility] at fax [phone],   Per pt's request to have labs drawn during her coumadin clinic appt on 11/28/11.

## 2012-02-21 ENCOUNTER — Telehealth: Payer: Self-pay | Admitting: Oncology

## 2012-02-21 NOTE — Telephone Encounter (Signed)
pt called to cx appt as she has a meeting and states that she will c/b to r/s

## 2012-02-26 ENCOUNTER — Ambulatory Visit: Payer: PRIVATE HEALTH INSURANCE | Admitting: Oncology

## 2012-02-26 ENCOUNTER — Other Ambulatory Visit: Payer: PRIVATE HEALTH INSURANCE | Admitting: Lab

## 2012-07-31 ENCOUNTER — Encounter: Payer: Self-pay | Admitting: Neurosurgery

## 2012-08-01 ENCOUNTER — Other Ambulatory Visit: Payer: PRIVATE HEALTH INSURANCE

## 2012-08-01 ENCOUNTER — Ambulatory Visit: Payer: PRIVATE HEALTH INSURANCE | Admitting: Neurosurgery

## 2012-08-01 ENCOUNTER — Encounter: Payer: Self-pay | Admitting: Neurosurgery

## 2012-08-01 ENCOUNTER — Other Ambulatory Visit (INDEPENDENT_AMBULATORY_CARE_PROVIDER_SITE_OTHER): Payer: PRIVATE HEALTH INSURANCE | Admitting: *Deleted

## 2012-08-01 ENCOUNTER — Ambulatory Visit (INDEPENDENT_AMBULATORY_CARE_PROVIDER_SITE_OTHER): Payer: PRIVATE HEALTH INSURANCE | Admitting: Neurosurgery

## 2012-08-01 DIAGNOSIS — I6529 Occlusion and stenosis of unspecified carotid artery: Secondary | ICD-10-CM | POA: Insufficient documentation

## 2012-08-01 DIAGNOSIS — Z48812 Encounter for surgical aftercare following surgery on the circulatory system: Secondary | ICD-10-CM

## 2012-08-01 NOTE — Progress Notes (Signed)
[facility] [place] Carotid Office Note  CC: Carotid surveillance Referring Physician: Fields  History of Present Illness: 72 year old female patient of Dr. Oneida Alar status post left CEA in 2008. The patient denies any signs or symptoms of CVA, TIA, amaurosis fugax. The patient denies any new medical diagnoses or recent surgery.  Past Medical History  Diagnosis Date  . Iron deficiency anemia   . Diabetes mellitus   . Hyperlipidemia   . A-fib   . Vitamin D deficiency   . Carotid artery occlusion   . Irregular heart beat     ROS: [x]  Positive   [ ]  Denies    General: [ ]  Weight loss, [ ]  Fever, [ ]  chills Neurologic: [ ]  Dizziness, [ ]  Blackouts, [ ]  Seizure [ ]  Stroke, [ ]  "Mini stroke", [ ]  Slurred speech, [ ]  Temporary blindness; [ ]  weakness in arms or legs, [ ]  Hoarseness Cardiac: [ ]  Chest pain/pressure, [ ]  Shortness of breath at rest [ ]  Shortness of breath with exertion, [ ]  Atrial fibrillation or irregular heartbeat Vascular: [ ]  Pain in legs with walking, [ ]  Pain in legs at rest, [ ]  Pain in legs at night,  [ ]  Non-healing ulcer, [ ]  Blood clot in vein/DVT,   Pulmonary: [ ]  Home oxygen, [ ]  Productive cough, [ ]  Coughing up blood, [ ]  Asthma,  [ ]  Wheezing Musculoskeletal:  [ ]  Arthritis, [ ]  Low back pain, [ ]  Joint pain Hematologic: [ ]  Easy Bruising, [ ]  Anemia; [ ]  Hepatitis Gastrointestinal: [ ]  Blood in stool, [ ]  Gastroesophageal Reflux/heartburn, [ ]  Trouble swallowing Urinary: [ ]  chronic Kidney disease, [ ]  on HD - [ ]  MWF or [ ]  TTHS, [ ]  Burning with urination, [ ]  Difficulty urinating Skin: [ ]  Rashes, [ ]  Wounds Psychological: [ ]  Anxiety, [ ]  Depression   Social History History  Substance Use Topics  . Smoking status: Current Every Day Smoker  . Smokeless tobacco: Never Used  . Alcohol Use: Yes    Family History Family History  Problem Relation Age of Onset  . Cancer Mother     Uterin  . Heart attack Father   .  Hypertension Sister     No Known Allergies  Current Outpatient Prescriptions  Medication Sig Dispense Refill  . diltiazem (CARDIZEM CD) 180 MG 24 hr capsule Take 180 mg by mouth 2 (two) times daily.        . ferrous sulfate 325 (65 FE) MG tablet Take 325 mg by mouth 2 (two) times daily.        . furosemide (LASIX) 40 MG tablet Take 40 mg by mouth daily.        Marland Kitchen KLOR-CON M20 20 MEQ tablet       . lisinopril (PRINIVIL,ZESTRIL) 5 MG tablet       . Misc Natural Products (OSTEO BI-FLEX ADV TRIPLE ST PO) Take 1 tablet by mouth 2 (two) times daily.        . Multiple Vitamin (MULTIVITAMIN) tablet Take 1 tablet by mouth daily.        . potassium chloride (KLOR-CON) 20 MEQ packet Take 20 mEq by mouth every Monday, Wednesday, and Friday.        . pravastatin (PRAVACHOL) 40 MG tablet Take 20 mg by mouth daily.       Alveda Reasons 20 MG TABS       . MetFORMIN HCl (GLUCOPHAGE XR PO) Take 1,000 mg by mouth 2 (  two) times daily.       Marland Kitchen warfarin (COUMADIN) 5 MG tablet Take 5 mg by mouth daily. As directed         No current facility-administered medications for this visit.    Physical Examination  Filed Vitals:   08/01/12 1559  BP: 129/67  Pulse: 72  Resp:     Body mass index is 28.99 kg/(m^2).  General:  WDWN in NAD Gait: Normal HEENT: WNL Eyes: Pupils equal Pulmonary: normal non-labored breathing , without Rales, rhonchi,  wheezing Cardiac: RRR, without  Murmurs, rubs or gallops; Abdomen: soft, NT, no masses Skin: no rashes, ulcers noted  Vascular Exam Pulses: 3+ radial pulses bilaterally Carotid bruits: carotid pulses heard bilaterally no bruits are heard Extremities without ischemic changes, no Gangrene , no cellulitis; no open wounds;  Musculoskeletal: no muscle wasting or atrophy   Neurologic: A&O X 3; Appropriate Affect ; SENSATION: normal; MOTOR FUNCTION:  moving all extremities equally. Speech is fluent/normal  Non-Invasive Vascular Imaging CAROTID DUPLEX 08/01/2012  Right  ICA 60 - 79 % stenosis Left ICA 0 - 19% stenosis   ASSESSMENT/PLAN: Asymptomatic patient with a slight increase in right ICA stenosis. The patient will followup in 6 months with repeat carotid duplex. The patient knows the signs and symptoms of CVA and knows to call 911 should that occur. The patient's questions were encouraged and answered, she is in agreement with this plan.  Beatris Ship ANP   Clinic MD: Oneida Alar

## 2012-08-02 ENCOUNTER — Other Ambulatory Visit: Payer: PRIVATE HEALTH INSURANCE

## 2012-08-02 NOTE — Addendum Note (Signed)
Addended by: Mena Goes on: 08/02/2012 08:09 AM   Modules accepted: Orders

## 2013-01-30 ENCOUNTER — Ambulatory Visit: Payer: PRIVATE HEALTH INSURANCE | Admitting: Neurosurgery

## 2013-01-30 ENCOUNTER — Other Ambulatory Visit (INDEPENDENT_AMBULATORY_CARE_PROVIDER_SITE_OTHER): Payer: PRIVATE HEALTH INSURANCE | Admitting: Vascular Surgery

## 2013-01-30 DIAGNOSIS — Z48812 Encounter for surgical aftercare following surgery on the circulatory system: Secondary | ICD-10-CM

## 2013-01-30 DIAGNOSIS — I6529 Occlusion and stenosis of unspecified carotid artery: Secondary | ICD-10-CM

## 2013-01-31 ENCOUNTER — Other Ambulatory Visit: Payer: Self-pay | Admitting: *Deleted

## 2013-01-31 DIAGNOSIS — Z48812 Encounter for surgical aftercare following surgery on the circulatory system: Secondary | ICD-10-CM

## 2013-02-06 ENCOUNTER — Encounter: Payer: Self-pay | Admitting: Vascular Surgery

## 2013-03-10 ENCOUNTER — Other Ambulatory Visit: Payer: Self-pay | Admitting: Interventional Cardiology

## 2013-03-19 ENCOUNTER — Telehealth: Payer: Self-pay | Admitting: Interventional Cardiology

## 2013-03-19 NOTE — Telephone Encounter (Signed)
New Problem  Pt wants to know how many times a day she is supposed to take new Blood thinner medication... Please advise

## 2013-03-19 NOTE — Telephone Encounter (Signed)
Spoke with pt.  She was just switched from Xarelto to Eliquis and wanted to verify how many times a day she should be taking it.  She has only been taking once daily.  Informed pt that Eliquis should be taken twice a day.  She will make the change today.

## 2013-04-28 ENCOUNTER — Telehealth: Payer: Self-pay | Admitting: Interventional Cardiology

## 2013-04-28 MED ORDER — APIXABAN 5 MG PO TABS: 5.0000 mg | ORAL_TABLET | Freq: Two times a day (BID) | ORAL | Status: DC

## 2013-04-28 NOTE — Telephone Encounter (Signed)
New problem    Pt would like to know if she can have some  Samples of Eliquis and a script she needs it to [place] her 30 sample card?   Please give her a call back.

## 2013-04-28 NOTE — Telephone Encounter (Signed)
Eliquis 5 MG #28 samples up front for pt to pick up. Also, two discount cards given to pt as well. Pt is aware to pick up.

## 2013-04-28 NOTE — Telephone Encounter (Deleted)
#  28 Elqi

## 2013-05-08 ENCOUNTER — Other Ambulatory Visit: Payer: Self-pay | Admitting: Interventional Cardiology

## 2013-05-11 ENCOUNTER — Other Ambulatory Visit: Payer: Self-pay | Admitting: Interventional Cardiology

## 2013-06-11 ENCOUNTER — Other Ambulatory Visit: Payer: Self-pay

## 2013-06-11 MED ORDER — POTASSIUM CHLORIDE CRYS ER 20 MEQ PO TBCR: EXTENDED_RELEASE_TABLET | ORAL | Status: DC

## 2013-06-13 ENCOUNTER — Other Ambulatory Visit: Payer: Self-pay

## 2013-06-24 ENCOUNTER — Telehealth: Payer: Self-pay | Admitting: Interventional Cardiology

## 2013-06-24 DIAGNOSIS — I4891 Unspecified atrial fibrillation: Secondary | ICD-10-CM

## 2013-06-24 MED ORDER — RIVAROXABAN 20 MG PO TABS: 20.0000 mg | ORAL_TABLET | Freq: Every day | ORAL | Status: DC

## 2013-06-24 NOTE — Telephone Encounter (Signed)
Spoke with pt and starting this month Eliquis is costing her $161.00 a month. Coumadin and Xarelto are other options. Pt tried Xarelto in the past and she switched to Eliquis because of all the stuff she read about it. Is there any other options other that warfarin?

## 2013-06-24 NOTE — Telephone Encounter (Signed)
New message   patient calling would like to change eliquis  To Different blood thinner .

## 2013-06-24 NOTE — Telephone Encounter (Signed)
Spoke with patient at length about her options.  Her insurance changed as of 06/05/13. The price of Eliquis is too much for her.  She tolerated Xarelto 20 mg qd very well historically, and we talked about her concerns about what she's read / heard in the media.  After this discussion patient would like to change back to Xarelto.  If the cost of Xarelto is too much then she is agreeable to change back to warfarin and f/u with [place] in clinic.  Patient instructed to start Xarelto 20 mg qd in the evening the night after she finishes her Eliquis.  Patient agreeable to this and repeats instruction to confirm understanding.  She will f/u with Dr. Irish Lack in 08/2013 as scheduled.  Will need to get BMET and CBC at that time.  Amy - please schedule a BMET and CBC for patient in 08/2013 for day she sees Dr. Irish Lack.  Then forward this to Dr. Irish Lack as Juluis Rainier.  Thanks.

## 2013-06-25 NOTE — Telephone Encounter (Signed)
Thanks for letting me know.  Yes, I would like a BMET and CBC for sometime in March.  Please notify patient and set up labs. Thanks.

## 2013-06-25 NOTE — Telephone Encounter (Signed)
Cynthia Hansen, 3 /15 appt is with Dr. Ruta Hinds. Do you want pt to come in [facility] sometime here for labs?

## 2013-06-27 NOTE — Telephone Encounter (Signed)
lmon secure vm with next appt for lab on 08/18/13.

## 2013-06-30 ENCOUNTER — Telehealth: Payer: Self-pay | Admitting: Interventional Cardiology

## 2013-06-30 DIAGNOSIS — I4891 Unspecified atrial fibrillation: Secondary | ICD-10-CM

## 2013-06-30 DIAGNOSIS — Z79899 Other long term (current) drug therapy: Secondary | ICD-10-CM

## 2013-06-30 NOTE — Telephone Encounter (Signed)
New problem   Pt need to speak to Ysidro Evert to see if she need a coumadin appt. Please call pt. If after 4pm please call [phone]

## 2013-06-30 NOTE — Telephone Encounter (Signed)
Patient instructed that 08/18/13 will be for blood work (BMET and CBC) given changing over to [place].  Won't need appointment with me.  Patient shows verbal understanding.  Labs put in system.

## 2013-07-04 ENCOUNTER — Other Ambulatory Visit: Payer: Self-pay | Admitting: Vascular Surgery

## 2013-07-04 DIAGNOSIS — Z48812 Encounter for surgical aftercare following surgery on the circulatory system: Secondary | ICD-10-CM

## 2013-07-04 DIAGNOSIS — I6529 Occlusion and stenosis of unspecified carotid artery: Secondary | ICD-10-CM

## 2013-07-08 ENCOUNTER — Encounter: Payer: Self-pay | Admitting: Cardiology

## 2013-07-08 DIAGNOSIS — E782 Mixed hyperlipidemia: Secondary | ICD-10-CM

## 2013-07-08 DIAGNOSIS — F17200 Nicotine dependence, unspecified, uncomplicated: Secondary | ICD-10-CM

## 2013-07-08 DIAGNOSIS — I10 Essential (primary) hypertension: Secondary | ICD-10-CM

## 2013-08-06 ENCOUNTER — Encounter: Payer: Self-pay | Admitting: Vascular Surgery

## 2013-08-07 ENCOUNTER — Encounter: Payer: Self-pay | Admitting: Vascular Surgery

## 2013-08-07 ENCOUNTER — Ambulatory Visit (INDEPENDENT_AMBULATORY_CARE_PROVIDER_SITE_OTHER): Payer: Medicare HMO | Admitting: Vascular Surgery

## 2013-08-07 ENCOUNTER — Ambulatory Visit (HOSPITAL_COMMUNITY)
Admission: RE | Admit: 2013-08-07 | Discharge: 2013-08-07 | Disposition: A | Discharge status: Home / Self Care | Payer: Medicare HMO | Source: Ambulatory Visit | Attending: Vascular Surgery | Admitting: Vascular Surgery

## 2013-08-07 VITALS — BP 150/62 | HR 77 | Ht 69.0 in | Wt 200.0 lb

## 2013-08-07 DIAGNOSIS — I6529 Occlusion and stenosis of unspecified carotid artery: Secondary | ICD-10-CM

## 2013-08-07 DIAGNOSIS — Z48812 Encounter for surgical aftercare following surgery on the circulatory system: Secondary | ICD-10-CM

## 2013-08-07 DIAGNOSIS — Z4889 Encounter for other specified surgical aftercare: Secondary | ICD-10-CM | POA: Insufficient documentation

## 2013-08-07 NOTE — Progress Notes (Signed)
[facility] [place] Carotid Office Note  History of Present Illness: 73 year old female patient of Dr. Oneida Alar status post left CEA in 2008. The patient denies any signs or symptoms of CVA, TIA, amaurosis fugax. The patient denies any new medical diagnoses or recent surgery. She has atrial fibrillation which is rate controlled. She is on Xarelto for this.    Past Medical History   Diagnosis  Date   .  Iron deficiency anemia     .  Diabetes mellitus     .  Hyperlipidemia     .  A-fib     .  Vitamin D deficiency     .  Carotid artery occlusion     .  Irregular heart beat       ROS: [x]  Positive   [ ]  Denies     General: [ ]  Weight loss, [ ]  Fever, [ ]  chills Neurologic: [ ]  Dizziness, [ ]  Blackouts, [ ]  Seizure [ ]  Stroke, [ ]  "Mini stroke", [ ]  Slurred speech, [ ]  Temporary blindness; [ ]  weakness in arms or legs, [ ]  Hoarseness Cardiac: [ ]  Chest pain/pressure, [ ]  Shortness of breath at rest [ ]  Shortness of breath with exertion, [ ]  Atrial fibrillation or irregular heartbeat Vascular: [ ]  Pain in legs with walking, [ ]  Pain in legs at rest, [ ]  Pain in legs at night,  [ ]  Non-healing ulcer, [ ]  Blood clot in vein/DVT,    Pulmonary: [ ]  Home oxygen, [ ]  Productive cough, [ ]  Coughing up blood, [ ]  Asthma,  [ ]  Wheezing Musculoskeletal:  [ ]  Arthritis, [ ]  Low back pain, [ ]  Joint pain Hematologic: [ ]  Easy Bruising, [ ]  Anemia; [ ]  Hepatitis Gastrointestinal: [ ]  Blood in stool, [ ]  Gastroesophageal Reflux/heartburn, [ ]  Trouble swallowing Urinary: [ ]  chronic Kidney disease, [ ]  on HD - [ ]  MWF or [ ]  TTHS, [ ]  Burning with urination, [ ]  Difficulty urinating Skin: [ ]  Rashes, [ ]  Wounds Psychological: [ ]  Anxiety, [ ]  Depression   Social History History   Substance Use Topics   .  Smoking status:  Current Every Day Smoker   .  Smokeless tobacco:  Never Used   .  Alcohol Use:  Yes     Family History Family History   Problem  Relation  Age of Onset    .  Cancer  Mother         Uterin   .  Heart attack  Father     .  Hypertension  Sister       No Known Allergies    Current Outpatient Prescriptions   Medication  Sig  Dispense  Refill   .  diltiazem (CARDIZEM CD) 180 MG 24 hr capsule  Take 180 mg by mouth 2 (two) times daily.           .  ferrous sulfate 325 (65 FE) MG tablet  Take 325 mg by mouth 2 (two) times daily.           .  furosemide (LASIX) 40 MG tablet  Take 40 mg by mouth daily.           Marland Kitchen  KLOR-CON M20 20 MEQ tablet           .  lisinopril (PRINIVIL,ZESTRIL) 5 MG tablet           .  Misc Natural Products (OSTEO BI-FLEX ADV TRIPLE ST PO)  Take 1 tablet by mouth 2 (two) times daily.           .  Multiple Vitamin (MULTIVITAMIN) tablet  Take 1 tablet by mouth daily.           .  potassium chloride (KLOR-CON) 20 MEQ packet  Take 20 mEq by mouth every Monday, Wednesday, and Friday.           .  pravastatin (PRAVACHOL) 40 MG tablet  Take 20 mg by mouth daily.          Alveda Reasons 20 MG TABS           .  MetFORMIN HCl (GLUCOPHAGE XR PO)  Take 1,000 mg by mouth 2 (two) times daily.          Marland Kitchen  warfarin (COUMADIN) 5 MG tablet  Take 5 mg by mouth daily. As directed              No current facility-administered medications for this visit.     Physical Examination    Filed Vitals:   08/07/13 1411 08/07/13 1412  BP: 139/61 150/62  Pulse: 77   Height: 5\' 9"  (1.753 m)   Weight: 200 lb (90.719 kg)   SpO2: 100%     General:  WDWN in NAD Gait: Normal HEENT: WNL Pulmonary: normal non-labored breathing , without Rales, rhonchi,  wheezing Cardiac: RRR, without  Murmurs, rubs or gallops; Neuro: Symmetric upper and lower extremity motor exam 5 her for               Non-Invasive Vascular Imaging CAROTID DUPLEX reviewed and interpreted this study. 60-80% right internal carotid artery stenosis no significant stenosis left carotid endarterectomy  ASSESSMENT/PLAN: Asymptomatic patient moderate right internal carotid artery stenosis.  The patient will followup in 6 months with repeat carotid duplex. The patient knows the signs and symptoms of CVA and knows to call 911 should that occur. The patient's questions were encouraged and answered, she is in agreement with this plan.  Ruta Hinds, MD [facility] [place] Office: [phone] Pager: [phone]

## 2013-08-18 ENCOUNTER — Other Ambulatory Visit (INDEPENDENT_AMBULATORY_CARE_PROVIDER_SITE_OTHER): Payer: Medicare HMO

## 2013-08-18 DIAGNOSIS — I4891 Unspecified atrial fibrillation: Secondary | ICD-10-CM

## 2013-08-19 LAB — CBC
HCT: 43 % (ref 36.0–46.0)
Hemoglobin: 14.2 g/dL (ref 12.0–15.0)
MCHC: 33 g/dL (ref 30.0–36.0)
MCV: 91.1 fl (ref 78.0–100.0)
Platelets: 317 10*3/uL (ref 150.0–400.0)
RBC: 4.73 Mil/uL (ref 3.87–5.11)
RDW: 14.5 % (ref 11.5–14.6)
WBC: 10.8 10*3/uL — ABNORMAL HIGH (ref 4.5–10.5)

## 2013-08-19 LAB — BASIC METABOLIC PANEL
BUN: 13 mg/dL (ref 6–23)
CO2: 28 mEq/L (ref 19–32)
Calcium: 9 mg/dL (ref 8.4–10.5)
Chloride: 99 mEq/L (ref 96–112)
Creatinine, Ser: 0.9 mg/dL (ref 0.4–1.2)
GFR: 67.22 mL/min (ref >60.00)
Glucose, Bld: 183 mg/dL — ABNORMAL HIGH (ref 70–99)
POTASSIUM: 3.8 meq/L (ref 3.5–5.1)
SODIUM: 136 meq/L (ref 135–145)

## 2013-08-27 ENCOUNTER — Other Ambulatory Visit: Payer: Self-pay | Admitting: Cardiology

## 2013-08-27 DIAGNOSIS — I4891 Unspecified atrial fibrillation: Secondary | ICD-10-CM

## 2013-09-17 ENCOUNTER — Encounter: Payer: Self-pay | Admitting: Interventional Cardiology

## 2013-09-17 ENCOUNTER — Ambulatory Visit (INDEPENDENT_AMBULATORY_CARE_PROVIDER_SITE_OTHER): Payer: Medicare HMO | Admitting: Interventional Cardiology

## 2013-09-17 VITALS — BP 144/74 | HR 57 | Ht 69.0 in | Wt 201.1 lb

## 2013-09-17 DIAGNOSIS — I10 Essential (primary) hypertension: Secondary | ICD-10-CM

## 2013-09-17 DIAGNOSIS — IMO0001 Reserved for inherently not codable concepts without codable children: Secondary | ICD-10-CM

## 2013-09-17 DIAGNOSIS — E785 Hyperlipidemia, unspecified: Secondary | ICD-10-CM

## 2013-09-17 DIAGNOSIS — F17200 Nicotine dependence, unspecified, uncomplicated: Secondary | ICD-10-CM

## 2013-09-17 DIAGNOSIS — M791 Myalgia, unspecified site: Secondary | ICD-10-CM

## 2013-09-17 DIAGNOSIS — I4891 Unspecified atrial fibrillation: Secondary | ICD-10-CM

## 2013-09-17 NOTE — Progress Notes (Signed)
Patient ID: Cynthia Hansen, female   DOB: 1940-07-04, 73 y.o.   MRN: [id number]    [address] [place], [place]  [address] Phone: [phone] Fax:  [phone]  Date:  09/17/2013   ID:  Cynthia Hansen, Shames 1941-03-21, MRN [id number]  PCP:  Gerrit Heck, MD      History of Present Illness: Cynthia Hansen is a 73 y.o. female who has had PVD. She was anemic and has improved with one dose IV iron. BP has been checked at the fitness center. After exercise, BP is normal. At work, [id number] systolic. BMI has decreased. Weight has decreased. Atrial Fibrillation F/U:  c/o Leg edema right calf worse, chronic. Disoloration. Denies : Chest pain.  Dizziness.  Orthopnea.  Palpitations.  Shortness of breath.  Syncope.    She reports diffuse muscle weakness.  She needs support to get up stairs and of of the toilet.  She has had wrist pain as well.  She feel sher strength is decreased.  No bleeding problems.     Wt Readings from Last 3 Encounters:  09/17/13 201 lb 1.9 oz (91.227 kg)  08/07/13 200 lb (90.719 kg)  08/01/12 196 lb 6.4 oz (89.086 kg)     Past Medical History  Diagnosis Date  . Iron deficiency anemia   . Diabetes mellitus   . Hyperlipidemia   . A-fib   . Vitamin D deficiency   . Carotid artery occlusion   . Irregular heart beat     Current Outpatient Prescriptions  Medication Sig Dispense Refill  . diltiazem (CARDIZEM CD) 180 MG 24 hr capsule TAKE 1 CAPSULE BY MOUTH TWICE A DAY BEFORE MEALS  180 capsule  2  . furosemide (LASIX) 40 MG tablet Take 40 mg by mouth daily.        Marland Kitchen lisinopril (PRINIVIL,ZESTRIL) 5 MG tablet Take 5 mg by mouth daily.       . MetFORMIN HCl (GLUCOPHAGE XR PO) Take 1,000 mg by mouth. 1.5 tablet in am and 1 tablet in pm      . potassium chloride SA (KLOR-CON M20) 20 MEQ tablet TAKE 1 TABLET BY MOUTH THREE DAYS PER WEEK  50 tablet  0  . pravastatin (PRAVACHOL) 40 MG tablet Take 20 mg by mouth daily.       . Rivaroxaban  (XARELTO) 20 MG TABS tablet Take 1 tablet (20 mg total) by mouth daily with supper.  30 tablet  5   No current facility-administered medications for this visit.    Allergies:    Allergies  Allergen Reactions  . Avandia [Rosiglitazone]     Irregular heart beats    Social History:  The patient  reports that she has been smoking.  She has never used smokeless tobacco. She reports that she drinks alcohol. She reports that she does not use illicit drugs.   Family History:  The patient's family history includes Cancer in her mother; Heart attack in her father; Hypertension in her sister.   ROS:  Please see the history of present illness.  No nausea, vomiting.  No fevers, chills.  Generalized weakness.  No dysuria.    All other systems reviewed and negative.   PHYSICAL EXAM: VS:  BP 144/74  Pulse 57  Ht 5\' 9"  (1.753 m)  Wt 201 lb 1.9 oz (91.227 kg)  BMI 29.69 kg/m2 Well nourished, well developed, in no acute distress HEENT: normal Neck: no JVD, no carotid bruits Cardiac:  normal S1, S2; iregularly  iregular Lungs:  clear to auscultation bilaterally, no wheezing, rhonchi or rales Abd: soft, nontender, no hepatomegaly Ext: tr edema, discoloration c/w venous insufficiency Skin: warm and dry Neuro:   no focal abnormalities noted  EKG:       ASSESSMENT AND PLAN:  Atrial fibrillation  Restarted Xarelto 20 mg tablet, 20 mg, 1 tablet, orally, once a day Stopped Eliquis 5 mg Tablet, 5 mg, 1 tablet, orally, twice a day, 30 days, 60, Refills 3 IMAGING: EKG    Overton,Shana 02/25/2013 04:10:17 PM > Ermel Verne,JAY 02/25/2013 04:31:58 PM > AFib, rate controlled   Notes: Rate controlled. No significant bleeding on Xarelto.    2. Essential hypertension, benign  Continue Diltiazem HCl Capsule Extended Release 24 Hour, 180 MG, TAKE 1 TABLET BY MOUTH TWICE A DAY BEFORE MEALS, Orally Continue Furosemide Tablet, 40 MG, 1 tablet, by mouth, Once a day Continue Lisinopril Tablet, 5 MG, 1 tablet,  Orally, Once a day Notes: Well controlled. Better with weight loss. Continue current lifestyle modifications. Controlled at home.    3. Mixed hyperlipidemia  Continue Fish Oil Capsule, 1200 MG, 4 capsule w/ omega 3, Orally, daily Continue Pravastatin. Check Vit D level. If low, will supplement.  If Vit D, will switch to Crestor 10 mg once a week.  Checking due to myalgia. In 7/14, Vit D 25., LDL 94 at that time.  Notes: Lipids well controlled in 7/14. LDL less than 100.    4. Smoker  Notes: She should stop smoking.  She is unable to cut back as well.   5. Venous insuffuciency: Could try leg elevation at night. Consider stronger compression stockings.  Consider referral to[facility] if she gets more pain. Preventive Medicine  Adult topics discussed:  Smoking cessation:  Patient advised to quit smoking      Signed, Mina Marble, MD, [facility] 09/17/2013 9:02 AM

## 2013-09-17 NOTE — Patient Instructions (Signed)
Your physician recommends that you return for lab work today for Vitamin D check.  Your physician recommends that you schedule a follow-up appointment as scheduled. You will receive a letter in the mail about 2 months before you are due and it will tell you to call and make an appt.

## 2013-09-18 LAB — VITAMIN D 25 HYDROXY (VIT D DEFICIENCY, FRACTURES): Vit D, 25-Hydroxy: 31 ng/mL (ref 30–89)

## 2013-09-19 ENCOUNTER — Other Ambulatory Visit: Payer: Self-pay

## 2013-09-19 MED ORDER — POTASSIUM CHLORIDE CRYS ER 20 MEQ PO TBCR: EXTENDED_RELEASE_TABLET | ORAL | Status: DC

## 2013-09-26 ENCOUNTER — Other Ambulatory Visit: Payer: Self-pay | Admitting: Cardiology

## 2013-09-26 MED ORDER — VITAMIN D 1000 UNITS PO CAPS: 1000.0000 [IU] | ORAL_CAPSULE | Freq: Every day | ORAL | Status: DC

## 2013-11-04 ENCOUNTER — Other Ambulatory Visit: Payer: Self-pay

## 2013-11-04 MED ORDER — PRAVASTATIN SODIUM 40 MG PO TABS: 20.0000 mg | ORAL_TABLET | Freq: Every day | ORAL | Status: DC

## 2014-01-13 ENCOUNTER — Other Ambulatory Visit: Payer: Self-pay | Admitting: Interventional Cardiology

## 2014-01-19 ENCOUNTER — Other Ambulatory Visit: Payer: Self-pay | Admitting: Interventional Cardiology

## 2014-01-20 ENCOUNTER — Telehealth: Payer: Self-pay | Admitting: Interventional Cardiology

## 2014-01-20 NOTE — Telephone Encounter (Signed)
Received request from Nurse fax box, documents faxed for surgical clearance. To: [facility] Fax number: [phone] Attention: [phone]/km

## 2014-02-02 ENCOUNTER — Other Ambulatory Visit: Payer: Self-pay | Admitting: Gastroenterology

## 2014-02-05 ENCOUNTER — Other Ambulatory Visit (HOSPITAL_COMMUNITY): Payer: Medicare HMO

## 2014-02-05 ENCOUNTER — Ambulatory Visit: Payer: Medicare HMO | Admitting: Family

## 2014-02-19 ENCOUNTER — Other Ambulatory Visit (INDEPENDENT_AMBULATORY_CARE_PROVIDER_SITE_OTHER): Payer: Medicare HMO

## 2014-02-19 DIAGNOSIS — I4891 Unspecified atrial fibrillation: Secondary | ICD-10-CM

## 2014-02-20 LAB — CBC
HEMATOCRIT: 40.8 % (ref 36.0–46.0)
Hemoglobin: 13.5 g/dL (ref 12.0–15.0)
MCHC: 33.2 g/dL (ref 30.0–36.0)
MCV: 91.4 fl (ref 78.0–100.0)
Platelets: 360 10*3/uL (ref 150.0–400.0)
RBC: 4.47 Mil/uL (ref 3.87–5.11)
RDW: 15.2 % (ref 11.5–15.5)
WBC: 11.4 10*3/uL — AB (ref 4.0–10.5)

## 2014-02-20 LAB — BASIC METABOLIC PANEL
BUN: 10 mg/dL (ref 6–23)
CALCIUM: 8.7 mg/dL (ref 8.4–10.5)
CO2: 26 mEq/L (ref 19–32)
Chloride: 103 mEq/L (ref 96–112)
Creatinine, Ser: 0.9 mg/dL (ref 0.4–1.2)
GFR: 64.58 mL/min (ref >60.00)
Glucose, Bld: 108 mg/dL — ABNORMAL HIGH (ref 70–99)
Potassium: 3.8 mEq/L (ref 3.5–5.1)
SODIUM: 138 meq/L (ref 135–145)

## 2014-03-04 ENCOUNTER — Other Ambulatory Visit: Payer: Self-pay | Admitting: Cardiology

## 2014-03-04 DIAGNOSIS — Z7901 Long term (current) use of anticoagulants: Secondary | ICD-10-CM

## 2014-03-19 ENCOUNTER — Ambulatory Visit: Payer: Medicare HMO | Admitting: Family

## 2014-03-19 ENCOUNTER — Other Ambulatory Visit (HOSPITAL_COMMUNITY): Payer: Medicare HMO

## 2014-03-23 ENCOUNTER — Other Ambulatory Visit: Payer: Self-pay | Admitting: *Deleted

## 2014-03-23 MED ORDER — DILTIAZEM HCL ER COATED BEADS 180 MG PO CP24: 180.0000 mg | ORAL_CAPSULE | Freq: Every day | ORAL | Status: DC

## 2014-04-09 ENCOUNTER — Encounter: Payer: Self-pay | Admitting: Family

## 2014-04-10 ENCOUNTER — Ambulatory Visit (INDEPENDENT_AMBULATORY_CARE_PROVIDER_SITE_OTHER): Payer: Medicare HMO | Admitting: Family

## 2014-04-10 ENCOUNTER — Encounter: Payer: Self-pay | Admitting: Family

## 2014-04-10 ENCOUNTER — Ambulatory Visit (HOSPITAL_COMMUNITY)
Admission: RE | Admit: 2014-04-10 | Discharge: 2014-04-10 | Disposition: A | Discharge status: Home / Self Care | Payer: Medicare HMO | Source: Ambulatory Visit | Attending: Family | Admitting: Family

## 2014-04-10 VITALS — BP 130/62 | HR 52 | Resp 16 | Ht 68.5 in | Wt 201.0 lb

## 2014-04-10 DIAGNOSIS — I6529 Occlusion and stenosis of unspecified carotid artery: Secondary | ICD-10-CM | POA: Insufficient documentation

## 2014-04-10 DIAGNOSIS — Z48812 Encounter for surgical aftercare following surgery on the circulatory system: Secondary | ICD-10-CM

## 2014-04-10 DIAGNOSIS — I739 Peripheral vascular disease, unspecified: Principal | ICD-10-CM

## 2014-04-10 DIAGNOSIS — I6523 Occlusion and stenosis of bilateral carotid arteries: Secondary | ICD-10-CM | POA: Insufficient documentation

## 2014-04-10 DIAGNOSIS — I779 Disorder of arteries and arterioles, unspecified: Secondary | ICD-10-CM

## 2014-04-10 NOTE — Patient Instructions (Addendum)
Stroke Prevention Some medical conditions and behaviors are associated with an increased chance of having a stroke. You may prevent a stroke by making healthy choices and managing medical conditions. HOW CAN I REDUCE MY RISK OF HAVING A STROKE?   Stay physically active. Get at least 30 minutes of activity on most or all days.  Do not smoke. It may also be helpful to avoid exposure to secondhand smoke.  Limit alcohol use. Moderate alcohol use is considered to be:  No more than 2 drinks per day for men.  No more than 1 drink per day for nonpregnant women.  Eat healthy foods. This involves:  Eating 5 or more servings of fruits and vegetables a day.  Making dietary changes that address high blood pressure (hypertension), high cholesterol, diabetes, or obesity.  Manage your cholesterol levels.  Making food choices that are high in fiber and low in saturated fat, trans fat, and cholesterol may control cholesterol levels.  Take any prescribed medicines to control cholesterol as directed by your health care provider.  Manage your diabetes.  Controlling your carbohydrate and sugar intake is recommended to manage diabetes.  Take any prescribed medicines to control diabetes as directed by your health care provider.  Control your hypertension.  Making food choices that are low in salt (sodium), saturated fat, trans fat, and cholesterol is recommended to manage hypertension.  Take any prescribed medicines to control hypertension as directed by your health care provider.  Maintain a healthy weight.  Reducing calorie intake and making food choices that are low in sodium, saturated fat, trans fat, and cholesterol are recommended to manage weight.  Stop drug abuse.  Avoid taking birth control pills.  Talk to your health care provider about the risks of taking birth control pills if you are over 35 years old, smoke, get migraines, or have ever had a blood clot.  Get evaluated for sleep  disorders (sleep apnea).  Talk to your health care provider about getting a sleep evaluation if you snore a lot or have excessive sleepiness.  Take medicines only as directed by your health care provider.  For some people, aspirin or blood thinners (anticoagulants) are helpful in reducing the risk of forming abnormal blood clots that can lead to stroke. If you have the irregular heart rhythm of atrial fibrillation, you should be on a blood thinner unless there is a good reason you cannot take them.  Understand all your medicine instructions.  Make sure that other conditions (such as anemia or atherosclerosis) are addressed. SEEK IMMEDIATE MEDICAL CARE IF:   You have sudden weakness or numbness of the face, arm, or leg, especially on one side of the body.  Your face or eyelid droops to one side.  You have sudden confusion.  You have trouble speaking (aphasia) or understanding.  You have sudden trouble seeing in one or both eyes.  You have sudden trouble walking.  You have dizziness.  You have a loss of balance or coordination.  You have a sudden, severe headache with no known cause.  You have new chest pain or an irregular heartbeat. Any of these symptoms may represent a serious problem that is an emergency. Do not wait to see if the symptoms will go away. Get medical help at once. Call your local emergency services (911 in [place] Do not drive yourself to the hospital. Document Released: 06/29/2004 Document Revised: 10/06/2013 Document Reviewed: 11/22/2012 Exit[employer] Patient Information 2015 [employer]. This information is not intended to replace advice given   to you by your health care provider. Make sure you discuss any questions you have with your health care provider.   Smoking Cessation Quitting smoking is important to your health and has many advantages. However, it is not always easy to quit since nicotine is a very addictive drug. Oftentimes, people try 3 times or more  before being able to quit. This document explains the best ways for you to prepare to quit smoking. Quitting takes hard work and a lot of effort, but you can do it. ADVANTAGES OF QUITTING SMOKING  You will live longer, feel better, and live better.  Your body will feel the impact of quitting smoking almost immediately.  Within 20 minutes, blood pressure decreases. Your pulse returns to its normal level.  After 8 hours, carbon monoxide levels in the blood return to normal. Your oxygen level increases.  After 24 hours, the chance of having a heart attack starts to decrease. Your breath, hair, and body stop smelling like smoke.  After 48 hours, damaged nerve endings begin to recover. Your sense of taste and smell improve.  After 72 hours, the body is virtually free of nicotine. Your bronchial tubes relax and breathing becomes easier.  After 2 to 12 weeks, lungs can hold more air. Exercise becomes easier and circulation improves.  The risk of having a heart attack, stroke, cancer, or lung disease is greatly reduced.  After 1 year, the risk of coronary heart disease is cut in half.  After 5 years, the risk of stroke falls to the same as a nonsmoker.  After 10 years, the risk of lung cancer is cut in half and the risk of other cancers decreases significantly.  After 15 years, the risk of coronary heart disease drops, usually to the level of a nonsmoker.  If you are pregnant, quitting smoking will improve your chances of having a healthy baby.  The people you live with, especially any children, will be healthier.  You will have extra money to spend on things other than cigarettes. QUESTIONS TO THINK ABOUT BEFORE ATTEMPTING TO QUIT You may want to talk about your answers with your health care provider.  Why do you want to quit?  If you tried to quit in the past, what helped and what did not?  What will be the most difficult situations for you after you quit? How will you plan to  handle them?  Who can help you through the tough times? Your family? Friends? A health care provider?  What pleasures do you get from smoking? What ways can you still get pleasure if you quit? Here are some questions to ask your health care provider:  How can you help me to be successful at quitting?  What medicine do you think would be best for me and how should I take it?  What should I do if I need more help?  What is smoking withdrawal like? How can I get information on withdrawal? GET READY  Set a quit date.  Change your environment by getting rid of all cigarettes, ashtrays, matches, and lighters in your home, car, or work. Do not let people smoke in your home.  Review your past attempts to quit. Think about what worked and what did not. GET SUPPORT AND ENCOURAGEMENT You have a better chance of being successful if you have help. You can get support in many ways.  Tell your family, friends, and coworkers that you are going to quit and need their support. Ask them not   to smoke around you.  Get individual, group, or telephone counseling and support. Programs are available at local hospitals and health centers. Call your local health department for information about programs in your area.  Spiritual beliefs and practices may help some smokers quit.  Download a "quit meter" on your computer to keep track of quit statistics, such as how long you have gone without smoking, cigarettes not smoked, and money saved.  Get a self-help book about quitting smoking and staying off tobacco. LEARN NEW SKILLS AND BEHAVIORS  Distract yourself from urges to smoke. Talk to someone, go for a walk, or occupy your time with a task.  Change your normal routine. Take a different route to work. Drink tea instead of coffee. Eat breakfast in a different place.  Reduce your stress. Take a hot bath, exercise, or read a book.  Plan something enjoyable to do every day. Reward yourself for not  smoking.  Explore interactive web-based programs that specialize in helping you quit. GET MEDICINE AND USE IT CORRECTLY Medicines can help you stop smoking and decrease the urge to smoke. Combining medicine with the above behavioral methods and support can greatly increase your chances of successfully quitting smoking.  Nicotine replacement therapy helps deliver nicotine to your body without the negative effects and risks of smoking. Nicotine replacement therapy includes nicotine gum, lozenges, inhalers, nasal sprays, and skin patches. Some may be available over-the-counter and others require a prescription.  Antidepressant medicine helps people abstain from smoking, but how this works is unknown. This medicine is available by prescription.  Nicotinic receptor partial agonist medicine simulates the effect of nicotine in your brain. This medicine is available by prescription. Ask your health care provider for advice about which medicines to use and how to use them based on your health history. Your health care provider will tell you what side effects to look out for if you choose to be on a medicine or therapy. Carefully read the information on the package. Do not use any other product containing nicotine while using a nicotine replacement product.  RELAPSE OR DIFFICULT SITUATIONS Most relapses occur within the first 3 months after quitting. Do not be discouraged if you start smoking again. Remember, most people try several times before finally quitting. You may have symptoms of withdrawal because your body is used to nicotine. You may crave cigarettes, be irritable, feel very hungry, cough often, get headaches, or have difficulty concentrating. The withdrawal symptoms are only temporary. They are strongest when you first quit, but they will go away within 10-14 days. To reduce the chances of relapse, try to:  Avoid drinking alcohol. Drinking lowers your chances of successfully quitting.  Reduce the  amount of caffeine you consume. Once you quit smoking, the amount of caffeine in your body increases and can give you symptoms, such as a rapid heartbeat, sweating, and anxiety.  Avoid smokers because they can make you want to smoke.  Do not let weight gain distract you. Many smokers will gain weight when they quit, usually less than 10 pounds. Eat a healthy diet and stay active. You can always lose the weight gained after you quit.  Find ways to improve your mood other than smoking. FOR MORE INFORMATION  www.smokefree.gov  Document Released: 05/16/2001 Document Revised: 10/06/2013 Document Reviewed: 08/31/2011 ExitCare Patient Information 2015 [employer]. This information is not intended to replace advice given to you by your health care provider. Make sure you discuss any questions you have with your health care   provider.   Smoking Cessation, Tips for Success If you are ready to quit smoking, congratulations! You have chosen to help yourself be healthier. Cigarettes bring nicotine, tar, carbon monoxide, and other irritants into your body. Your lungs, heart, and blood vessels will be able to work better without these poisons. There are many different ways to quit smoking. Nicotine gum, nicotine patches, a nicotine inhaler, or nicotine nasal spray can help with physical craving. Hypnosis, support groups, and medicines help break the habit of smoking. WHAT THINGS CAN I DO TO MAKE QUITTING EASIER?  Here are some tips to help you quit for good:  Pick a date when you will quit smoking completely. Tell all of your friends and family about your plan to quit on that date.  Do not try to slowly cut down on the number of cigarettes you are smoking. Pick a quit date and quit smoking completely starting on that day.  Throw away all cigarettes.   Clean and remove all ashtrays from your home, work, and car.  On a card, write down your reasons for quitting. Carry the card with you and read it when  you get the urge to smoke.  Cleanse your body of nicotine. Drink enough water and fluids to keep your urine clear or pale yellow. Do this after quitting to flush the nicotine from your body.  Learn to predict your moods. Do not let a bad situation be your excuse to have a cigarette. Some situations in your life might tempt you into wanting a cigarette.  Never have "just one" cigarette. It leads to wanting another and another. Remind yourself of your decision to quit.  Change habits associated with smoking. If you smoked while driving or when feeling stressed, try other activities to replace smoking. Stand up when drinking your coffee. Brush your teeth after eating. Sit in a different chair when you read the paper. Avoid alcohol while trying to quit, and try to drink fewer caffeinated beverages. Alcohol and caffeine may urge you to smoke.  Avoid foods and drinks that can trigger a desire to smoke, such as sugary or spicy foods and alcohol.  Ask people who smoke not to smoke around you.  Have something planned to do right after eating or having a cup of coffee. For example, plan to take a walk or exercise.  Try a relaxation exercise to calm you down and decrease your stress. Remember, you may be tense and nervous for the first 2 weeks after you quit, but this will pass.  Find new activities to keep your hands busy. Play with a pen, coin, or rubber band. Doodle or draw things on paper.  Brush your teeth right after eating. This will help cut down on the craving for the taste of tobacco after meals. You can also try mouthwash.   Use oral substitutes in place of cigarettes. Try using lemon drops, carrots, cinnamon sticks, or chewing gum. Keep them handy so they are available when you have the urge to smoke.  When you have the urge to smoke, try deep breathing.  Designate your home as a nonsmoking area.  If you are a heavy smoker, ask your health care provider about a prescription for nicotine  chewing gum. It can ease your withdrawal from nicotine.  Reward yourself. Set aside the cigarette money you save and buy yourself something nice.  Look for support from others. Join a support group or smoking cessation program. Ask someone at home or at work to help  you with your plan to quit smoking.  Always ask yourself, "Do I need this cigarette or is this just a reflex?" Tell yourself, "Today, I choose not to smoke," or "I do not want to smoke." You are reminding yourself of your decision to quit.  Do not replace cigarette smoking with electronic cigarettes (commonly called e-cigarettes). The safety of e-cigarettes is unknown, and some may contain harmful chemicals.  If you relapse, do not give up! Plan ahead and think about what you will do the next time you get the urge to smoke. HOW WILL I FEEL WHEN I QUIT SMOKING? You may have symptoms of withdrawal because your body is used to nicotine (the addictive substance in cigarettes). You may crave cigarettes, be irritable, feel very hungry, cough often, get headaches, or have difficulty concentrating. The withdrawal symptoms are only temporary. They are strongest when you first quit but will go away within 10-14 days. When withdrawal symptoms occur, stay in control. Think about your reasons for quitting. Remind yourself that these are signs that your body is healing and getting used to being without cigarettes. Remember that withdrawal symptoms are easier to treat than the major diseases that smoking can cause.  Even after the withdrawal is over, expect periodic urges to smoke. However, these cravings are generally short lived and will go away whether you smoke or not. Do not smoke! WHAT RESOURCES ARE AVAILABLE TO HELP ME QUIT SMOKING? Your health care provider can direct you to community resources or hospitals for support, which may include:  Group support.  Education.  Hypnosis.  Therapy.  1-800-QUITNOW Document Released: 02/18/2004  Document Revised: 10/06/2013 Document Reviewed: 11/07/2012 [facility] Patient Information 2015 [place], [place]. This information is not intended to replace advice given to you by your health care provider. Make sure you discuss any questions you have with your health care provider.

## 2014-04-10 NOTE — Addendum Note (Signed)
Addended by: Thresa Ross C on: 04/10/2014 02:57 PM   Modules accepted: Orders

## 2014-04-10 NOTE — Progress Notes (Signed)
Established Carotid Patient   History of Present Illness  Cynthia Hansen is a 73 y.o. female patient of Dr. Oneida Alar who is status post left CEA in 2008 and returns today for follow up. She has atrial fibrillation which is rate controlled. She is on Xarelto for this. who has known carotid stenosis.  The patient denies any history of TIA or stroke symptoms, specifically the patient denies a history of amaurosis fugax or monocular blindness, denies a history unilateral  of facial drooping, denies a history of hemiplegia, and denies a history of receptive or expressive aphasia.   Patient states she has a deteriorating disc in her lumbar spine. She denies claudication symptoms with walking, has low back pain and weakness in legs with climbing stairs, denies non healing wounds. She recently was treated for a UTI with Cipro. 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 States she will work on stopping smoking and start to attend a fitness center 3-4 days/year  Pt Diabetic: Yes, 120's glucose fasting Pt smoker: smoker  (1/2 ppd, started smoking at age 12-30 yrs)  Pt meds include: Statin : Yes ASA: No Other anticoagulants/antiplatelets: Xaralto for atrial fib   Past Medical History  Diagnosis Date  . Iron deficiency anemia   . Diabetes mellitus   . Hyperlipidemia   . A-fib   . Vitamin D deficiency   . Carotid artery occlusion   . Irregular heart beat     Social History History  Substance Use Topics  . Smoking status: Current Every Day Smoker  . Smokeless tobacco: Never Used  . Alcohol Use: Yes    Family History Family History  Problem Relation Age of Onset  . Cancer Mother     Uterin  . Heart attack Father   . Hypertension Sister     Surgical History Past Surgical History  Procedure Laterality Date  . Abdominal hernia repair    . Carotid endarterectomy      LEFT cea    Allergies  Allergen Reactions  . Avandia [Rosiglitazone]     Irregular heart beats     Current Outpatient Prescriptions  Medication Sig Dispense Refill  . diltiazem (CARDIZEM CD) 180 MG 24 hr capsule Take 1 capsule (180 mg total) by mouth daily. 180 capsule 0  . furosemide (LASIX) 40 MG tablet Take 40 mg by mouth daily.      Marland Kitchen glimepiride (AMARYL) 1 MG tablet   1  . KLOR-CON M20 20 MEQ tablet TAKE 1 TABLET BY MOUTH 3 DAYS A WEEK 50 tablet 0  . lisinopril (PRINIVIL,ZESTRIL) 5 MG tablet Take 5 mg by mouth daily.     . MetFORMIN HCl (GLUCOPHAGE XR PO) Take 1,000 mg by mouth. 1.5 tablet in am and 1 tablet in pm    . ONE TOUCH ULTRA TEST test strip   5  . pravastatin (PRAVACHOL) 40 MG tablet Take 0.5 tablets (20 mg total) by mouth daily. 45 tablet 3  . XARELTO 20 MG TABS tablet TAKE 1 TABLET (20 MG TOTAL) BY MOUTH DAILY WITH SUPPER. 30 tablet 5  . Cholecalciferol (VITAMIN D) 1000 UNITS capsule Take 1 capsule (1,000 Units total) by mouth daily.     No current facility-administered medications for this visit.    Review of Systems : See HPI for pertinent positives and negatives.  Physical Examination  Filed Vitals:   04/10/14 0916 04/10/14 0920  BP: 140/68 130/62  Pulse: 52 52  Resp:  16  Height:  5' 8.5" (1.74  m)  Weight:  201 lb (91.173 kg)  SpO2:  100%   Body mass index is 30.11 kg/(m^2).  General: WDWN female in NAD GAIT: normal Eyes: PERRLA Pulmonary:  Non-labored, CTAB, Negative  Rales, Negative rhonchi, & Negative wheezing.  Cardiac: irregular Rhythm, no detected murmur.  VASCULAR EXAM Carotid Bruits Right Left   Negative Negative    Aorta is not palpable. Radial pulses are 2+ palpable and equal.                                                                                                                            LE Pulses Right Left       POPLITEAL  not palpable   not palpable       POSTERIOR TIBIAL  not palpable   not palpable        DORSALIS PEDIS      ANTERIOR TIBIAL 1+ palpable  2+ palpable     Gastrointestinal: soft, nontender, BS  WNL, no r/g,  negative palpated masses. Musculoskeletal: Negative muscle atrophy/wasting. M/S 5/5 throughout, extremities without ischemic changes.  Neurologic: A&O X 3; Appropriate Affect ; SENSATION ;normal;  Speech is normal CN 2-12 intact except mild hearing loss, is wearing hearing aids, Pain and light touch intact in extremities, Motor exam as listed above.   Non-Invasive Vascular Imaging CAROTID DUPLEX 04/10/2014   CEREBROVASCULAR DUPLEX EVALUATION    INDICATION: Carotid artery disease    PREVIOUS INTERVENTION(S): Left carotid endarterectomy 05/08/2007    DUPLEX EXAM: Carotid duplex    RIGHT  LEFT  Peak Systolic Velocities (cm/s) End Diastolic Velocities (cm/s) Plaque LOCATION Peak Systolic Velocities (cm/s) End Diastolic Velocities (cm/s) Plaque  74 12 HT CCA PROXIMAL 91 22 HT  61 14 - CCA MID 74 25 HM  77 16 HT CCA DISTAL 59 21 HM  158 14 HT ECA 154 8 HT  259 51 HT ICA PROXIMAL 65 20 -  147 37 - ICA MID 78 25 -  104 32 - ICA DISTAL 83 21 -    4.2 ICA / CCA Ratio (PSV) 1.1  AntegrAntegradeade Vertebral Flow Antegrade  [id number] Brachial Systolic Pressure (mmHg) 123456  Triphasic Brachial Artery Waveforms Triphasic    Plaque Morphology:  HM = Homogeneous, HT = Heterogeneous, CP = Calcific Plaque, SP = Smooth Plaque, IP = Irregular Plaque  ADDITIONAL FINDINGS:     IMPRESSION: 1. 40 - 59% right internal carotid artery stenosis. 2. Patent left carotid endarterectomy site with no evidence for restenosis.    Compared to the previous exam:  No significant change      Assessment: [name] is a 73 y.o. female who presents with asymptomatic 40 - 59% (closer to 60-79%) right internal carotid artery stenosis and patent left carotid endarterectomy site with no evidence for restenosis. The  ICA stenosis is Unchanged from previous exam. Her primary atherosclerotic risk factors are continued smoking and DM, possibly uncontrolled. The patient was counseled re smoking cessation and  given several free resources re smoking cessation. The patient was also advised to work closely with her PCP to get her DM in as good control as possible.  Plan: Follow-up in 6 months with Carotid Duplex scan.   I discussed in depth with the patient the nature of atherosclerosis, and emphasized the importance of maximal medical management including strict control of blood pressure, blood glucose, and lipid levels, obtaining regular exercise, and cessation of smoking.  The patient is aware that without maximal medical management the underlying atherosclerotic disease process will progress, limiting the benefit of any interventions. The patient was given information about stroke prevention and what symptoms should prompt the patient to seek immediate medical care. Thank you for allowing [place] to participate in this patient's care.  Clemon Chambers, RN, MSN, FNP-C [facility] Office: [phone]  Clinic Physician: Early on call  04/10/2014 9:14 AM

## 2014-04-16 ENCOUNTER — Other Ambulatory Visit: Payer: Self-pay | Admitting: Interventional Cardiology

## 2014-05-11 ENCOUNTER — Other Ambulatory Visit: Payer: Self-pay | Admitting: *Deleted

## 2014-05-11 ENCOUNTER — Other Ambulatory Visit: Payer: Self-pay

## 2014-05-11 MED ORDER — DILTIAZEM HCL ER COATED BEADS 180 MG PO CP24: ORAL_CAPSULE | ORAL | Status: DC

## 2014-05-12 ENCOUNTER — Telehealth: Payer: Self-pay | Admitting: *Deleted

## 2014-05-12 NOTE — Telephone Encounter (Signed)
Xarelto samples placed at the front desk for patient. 

## 2014-06-29 ENCOUNTER — Ambulatory Visit: Payer: Medicare HMO | Admitting: Interventional Cardiology

## 2014-07-02 ENCOUNTER — Encounter: Payer: Self-pay | Admitting: Interventional Cardiology

## 2014-07-05 ENCOUNTER — Inpatient Hospital Stay (HOSPITAL_COMMUNITY)
Admission: AD | Admit: 2014-07-05 | Discharge: 2014-07-05 | Disposition: A | Discharge status: Home / Self Care | Payer: PPO | Source: Ambulatory Visit | Attending: Obstetrics & Gynecology | Admitting: Obstetrics & Gynecology

## 2014-07-05 ENCOUNTER — Encounter (HOSPITAL_COMMUNITY): Payer: Self-pay

## 2014-07-05 ENCOUNTER — Inpatient Hospital Stay (HOSPITAL_COMMUNITY): Payer: PPO

## 2014-07-05 DIAGNOSIS — N939 Abnormal uterine and vaginal bleeding, unspecified: Secondary | ICD-10-CM

## 2014-07-05 DIAGNOSIS — Z8049 Family history of malignant neoplasm of other genital organs: Secondary | ICD-10-CM | POA: Insufficient documentation

## 2014-07-05 DIAGNOSIS — E119 Type 2 diabetes mellitus without complications: Secondary | ICD-10-CM | POA: Insufficient documentation

## 2014-07-05 DIAGNOSIS — Z975 Presence of (intrauterine) contraceptive device: Secondary | ICD-10-CM | POA: Insufficient documentation

## 2014-07-05 DIAGNOSIS — F17210 Nicotine dependence, cigarettes, uncomplicated: Secondary | ICD-10-CM | POA: Diagnosis not present

## 2014-07-05 DIAGNOSIS — E559 Vitamin D deficiency, unspecified: Secondary | ICD-10-CM | POA: Diagnosis not present

## 2014-07-05 LAB — URINE MICROSCOPIC-ADD ON

## 2014-07-05 LAB — URINALYSIS, ROUTINE W REFLEX MICROSCOPIC
Bilirubin Urine: NEGATIVE
Glucose, UA: NEGATIVE mg/dL
KETONES UR: NEGATIVE mg/dL
LEUKOCYTES UA: NEGATIVE
Nitrite: NEGATIVE
PH: 5.5 (ref 5.0–8.0)
PROTEIN: NEGATIVE mg/dL
Specific Gravity, Urine: 1.02 (ref 1.005–1.030)
Urobilinogen, UA: 0.2 mg/dL (ref 0.0–1.0)

## 2014-07-05 LAB — WET PREP, GENITAL
Clue Cells Wet Prep HPF POC: NONE SEEN
Trich, Wet Prep: NONE SEEN
Yeast Wet Prep HPF POC: NONE SEEN

## 2014-07-05 LAB — CBC
HEMATOCRIT: 43.8 % (ref 36.0–46.0)
HEMOGLOBIN: 14.6 g/dL (ref 12.0–15.0)
MCH: 29.9 pg (ref 26.0–34.0)
MCHC: 33.3 g/dL (ref 30.0–36.0)
MCV: 89.6 fL (ref 78.0–100.0)
PLATELETS: 320 10*3/uL (ref 150–400)
RBC: 4.89 MIL/uL (ref 3.87–5.11)
RDW: 14.6 % (ref 11.5–15.5)
WBC: 12.1 10*3/uL — ABNORMAL HIGH (ref 4.0–10.5)

## 2014-07-05 NOTE — MAU Provider Note (Signed)
History     CSN: [id number]  Arrival date and time: 07/05/14 1051   First Provider Initiated Contact with Patient 07/05/14 1214      Chief Complaint  Patient presents with  . Vaginal Bleeding   HPI Cynthia Hansen 74 y.o. Comes to MAU with 2 episodes today of a large amount of painless red vaginal bleeding.  Has not had vaginal bleeding since menopause in her early 22's.  Had regular monthly periods which were not heavy and did not have cramping.  Did have one very heavy period in her early 57's and went to see the doctor.  Does not remember who that was.  Is concerned about the bleeding as her mother had uterine cancer.  Has not had a pelvic exam in years - no recent pap.  Takes blood thinners since her carotid endarterectomy.   OB History    No data available      Past Medical History  Diagnosis Date  . Diabetes mellitus   . Hyperlipidemia   . A-fib   . Vitamin D deficiency   . Carotid artery occlusion   . Irregular heart beat     Past Surgical History  Procedure Laterality Date  . Abdominal hernia repair    . Carotid endarterectomy Left Dec. 3, 2008     cea    Family History  Problem Relation Age of Onset  . Cancer Mother     Uterin  . Heart attack Father   . Hypertension Sister     History  Substance Use Topics  . Smoking status: Current Every Day Smoker    Types: Cigarettes  . Smokeless tobacco: Never Used  . Alcohol Use: Yes    Allergies:  Allergies  Allergen Reactions  . Avandia [Rosiglitazone]     Irregular heart beats    Prescriptions prior to admission  Medication Sig Dispense Refill Last Dose  . diltiazem (CARDIZEM CD) 180 MG 24 hr capsule TAKE 1 CAPSULE BY MOUTH TWICE A DAY BEFORE MEALS 180 capsule 0 07/05/2014 at Unknown time  . furosemide (LASIX) 40 MG tablet Take 40 mg by mouth daily.     07/05/2014 at Unknown time  . glimepiride (AMARYL) 1 MG tablet   1 07/05/2014 at Unknown time  . KLOR-CON M20 20 MEQ tablet TAKE 1 TABLET BY MOUTH 3  DAYS A WEEK 50 tablet 2 07/05/2014 at Unknown time  . lisinopril (PRINIVIL,ZESTRIL) 5 MG tablet Take 5 mg by mouth daily.    07/05/2014 at Unknown time  . metFORMIN (GLUCOPHAGE) 1000 MG tablet Take 1,000 mg by mouth 2 (two) times daily with a meal.   07/05/2014 at Unknown time  . pravastatin (PRAVACHOL) 40 MG tablet Take 0.5 tablets (20 mg total) by mouth daily. 45 tablet 3 07/04/2014 at Unknown time  . XARELTO 20 MG TABS tablet TAKE 1 TABLET (20 MG TOTAL) BY MOUTH DAILY WITH SUPPER. 30 tablet 5 07/04/2014 at Unknown time  . Cholecalciferol (VITAMIN D) 1000 UNITS capsule Take 1 capsule (1,000 Units total) by mouth daily. (Patient not taking: Reported on 07/05/2014)   Not Taking  . MetFORMIN HCl (GLUCOPHAGE XR PO) Take 1,000 mg by mouth. 1.5 tablet in am and 1 tablet in pm   Taking  . ONE TOUCH ULTRA TEST test strip   5 Taking    Review of Systems  Constitutional: Negative for fever.  Gastrointestinal: Negative for nausea, vomiting and abdominal pain.  Genitourinary:       Vaginal bleeding today.  Physical Exam   Blood pressure 138/70, pulse 101, temperature 98.4 F (36.9 C), temperature source Oral, resp. rate 18, height 5\' 8"  (1.727 m), weight 192 lb (87.091 kg).  Physical Exam  Nursing note and vitals reviewed. Constitutional: She is oriented to person, place, and time. She appears well-developed and well-nourished.  HENT:  Head: Normocephalic.  Eyes: EOM are normal.  Neck: Neck supple.  GI: Soft. There is no tenderness. There is no rebound and no guarding.  Genitourinary:  Speculum exam: Vagina - Small amount of dark blood seen, no active bleeding Cervix - Difficult to fully visualize. Bimanual exam: Cervix closed and IUD string palpated.  Client unaware she has an IUD.  Thought the IUD was removed years ago. Uterus non tender, normal size Adnexa non tender, no masses bilaterally GC/Chlam, wet prep done Speculum reinserted and IUD strings grasped with Kelly clamp.  Unable to  remove IUD - Strings slipped through the [place] and then one string broke.  IUD not removed. Chaperone present for exam.  Musculoskeletal: Normal range of motion.  Neurological: She is alert and oriented to person, place, and time.  Skin: Skin is warm and dry.  Psychiatric: She has a normal mood and affect.    MAU Course  Procedures Results for orders placed or performed during the hospital encounter of 07/05/14 (from the past 24 hour(s))  Urinalysis, Routine w reflex microscopic     Status: Abnormal   Collection Time: 07/05/14 11:10 AM  Result Value Ref Range   Color, Urine YELLOW YELLOW   APPearance CLEAR CLEAR   Specific Gravity, Urine 1.020 1.005 - 1.030   pH 5.5 5.0 - 8.0   Glucose, UA NEGATIVE NEGATIVE mg/dL   Hgb urine dipstick MODERATE (A) NEGATIVE   Bilirubin Urine NEGATIVE NEGATIVE   Ketones, ur NEGATIVE NEGATIVE mg/dL   Protein, ur NEGATIVE NEGATIVE mg/dL   Urobilinogen, UA 0.2 0.0 - 1.0 mg/dL   Nitrite NEGATIVE NEGATIVE   Leukocytes, UA NEGATIVE NEGATIVE  Urine microscopic-add on     Status: Abnormal   Collection Time: 07/05/14 11:10 AM  Result Value Ref Range   Squamous Epithelial / LPF FEW (A) RARE   WBC, UA 0-2 <3 WBC/hpf   RBC / HPF 0-2 <3 RBC/hpf  Wet prep, genital     Status: Abnormal   Collection Time: 07/05/14 12:30 PM  Result Value Ref Range   Yeast Wet Prep HPF POC NONE SEEN NONE SEEN   Trich, Wet Prep NONE SEEN NONE SEEN   Clue Cells Wet Prep HPF POC NONE SEEN NONE SEEN   WBC, Wet Prep HPF POC FEW (A) NONE SEEN    MDM CLINICAL DATA: Postmenopausal female with vaginal bleeding. History  of IUD in place for more than 20 years. The device could not be  removed.  EXAM:  TRANSABDOMINAL AND TRANSVAGINAL ULTRASOUND OF PELVIS  TECHNIQUE:  Both transabdominal and transvaginal ultrasound examinations of the  pelvis were performed. Transabdominal technique was performed for  global imaging of the pelvis including uterus, ovaries, adnexal  regions, and  pelvic cul-de-sac. It was necessary to proceed with  endovaginal exam following the transabdominal exam to visualize the  endometrium.  COMPARISON: None  FINDINGS:  Uterus  Measurements: 6.3 x 3.0 x 3.5 cm. No fibroids or other mass  visualized.  Endometrium  Thickness: 0.4 cm. IUD is in place. No focal abnormality.  Right ovary  Not visualized.  Left ovary  Not visualized.  Other findings  No free fluid.  IMPRESSION:  No  acute abnormality. IUD is in place.  Ovaries were not visualized.    Assessment and Plan  Abnormal Vaginal bleeding IUD in place  Plan Will send a message to the clinic to schedule an appointment for further evaluation and IUD removal. No further bleeding at this time.  Starla Deller 07/05/2014, 12:42 PM

## 2014-07-05 NOTE — MAU Note (Signed)
Pt presents to MAU with complaints of vaginal bleeding. Reports that she woke up and and thought she had voided on herself but when she went to the bathroom and noticed it was blood. She states she put in a tampon and then the bleeding happened again

## 2014-07-06 LAB — GC/CHLAMYDIA PROBE AMP (~~LOC~~) NOT AT ARMC
CHLAMYDIA, DNA PROBE: NEGATIVE
Neisseria Gonorrhea: NEGATIVE

## 2014-07-07 ENCOUNTER — Telehealth: Payer: Self-pay

## 2014-07-07 NOTE — Telephone Encounter (Signed)
Pt called the front desk and stated that she is having a lot of bleeding and clots.  She felt like her IUD has come out.  She wanted to be seen.  Looking at pt's chart pt went to MAU on 07/05/14 for the complaint that she has just informed me.  An [place] was done and it showed that her IUD was in place.  I advised pt that I would have to speak with our clinical manager to get her an appt and that I would call her back.  Pt agreed.   Called pt and informed her that her appt was scheduled for 07/13/14 @ 1445.  Pt stated understanding with no further questions.

## 2014-07-10 ENCOUNTER — Telehealth: Payer: Self-pay | Admitting: Interventional Cardiology

## 2014-07-10 DIAGNOSIS — I4891 Unspecified atrial fibrillation: Secondary | ICD-10-CM

## 2014-07-10 NOTE — Telephone Encounter (Signed)
It is up to the GYN if she can stay on the Xarelto or has to be off.  It takes 2 days off the medicine before the anticoagulant effect is gone.

## 2014-07-10 NOTE — Telephone Encounter (Signed)
Please see if this patient has had their NOAC labs checked with PMD.         Thanks        JV    ----- Message -----     From: SYSTEM     Sent: 07/05/2014 12:04 AM      To: Jettie Booze, MD     I called the patient to follow up and see if she has had a BMP recently. She had a CBC done on 07/05/14 in the ER. She was seen for vaginal bleeding that started abruptly. She states this was very heavy to start and she has been passing some clots, but it is improving some. She reports that she saw a GYN about 20 years ago that she states "hurt her and made her cry" with his exam. He reported to her at the time that she had an IUD that needed to come out. She thought he removed it at the time, but was told recently on evaluation that it is still in place. She is going to have this removed on Monday in the [facility] by Dr. Harolyn Rutherford. She is still currently on Xarelto and has not been told by GYN to stop this. Since she is still having some bleeding, I advised her I would review with Dr. Irish Lack and call her back later today. She is agreeable. Will place an order for a BMP to be drawn on 2/8.

## 2014-07-10 NOTE — Telephone Encounter (Signed)
The patient is aware of Dr. Hassell Done recommendations.

## 2014-07-13 ENCOUNTER — Encounter: Payer: Self-pay | Admitting: Obstetrics & Gynecology

## 2014-07-13 ENCOUNTER — Ambulatory Visit (INDEPENDENT_AMBULATORY_CARE_PROVIDER_SITE_OTHER): Payer: PPO | Admitting: Obstetrics & Gynecology

## 2014-07-13 VITALS — BP 140/52 | HR 86 | Temp 98.0°F | Wt 188.8 lb

## 2014-07-13 DIAGNOSIS — N950 Postmenopausal bleeding: Secondary | ICD-10-CM

## 2014-07-13 DIAGNOSIS — Z01818 Encounter for other preprocedural examination: Secondary | ICD-10-CM

## 2014-07-13 DIAGNOSIS — Z1231 Encounter for screening mammogram for malignant neoplasm of breast: Secondary | ICD-10-CM

## 2014-07-13 DIAGNOSIS — I4891 Unspecified atrial fibrillation: Secondary | ICD-10-CM

## 2014-07-13 DIAGNOSIS — Z975 Presence of (intrauterine) contraceptive device: Secondary | ICD-10-CM | POA: Insufficient documentation

## 2014-07-13 NOTE — Progress Notes (Signed)
CLINIC ENCOUNTER NOTE  History:  74 y.o. G1P1001 here today for discussion of evaluation and management of postmenopausal bleeding and retained IUD for over 20 years s/p unsuccessful removal attempt in MAU on 07/05/14.  As per Earlie Server, NP notes on 07/05/14: Cynthia Hansen 74 y.o. Comes to MAU with 2 episodes today of a large amount of painless red vaginal bleeding. Has not had vaginal bleeding since menopause in her early 39's. Had regular monthly periods which were not heavy and did not have cramping. Did have one very heavy period in her early 72's and went to see the doctor. Does not remember who that was. Is concerned about the bleeding as her mother had uterine cancer. Has not had a pelvic exam in years - no recent pap. Takes Xarelto since her carotid endarterectomy.  Patient Active Problem List   Diagnosis Date Noted  . Postmenopausal bleeding 07/13/2014  . IUD (intrauterine device) in place for over 20 years 07/13/2014  . Carotid stenosis 04/10/2014  . Aftercare following surgery of the circulatory system 04/10/2014  . Essential hypertension, benign 07/08/2013  . Mixed hyperlipidemia 07/08/2013  . Tobacco use disorder 07/08/2013  . Occlusion and stenosis of carotid artery without mention of cerebral infarction 08/01/2012  . Iron deficiency anemia   . Diabetes mellitus   . Hyperlipidemia   . A-fib   . Vitamin d deficiency    Past Medical History  Diagnosis Date  . Diabetes mellitus   . Hyperlipidemia   . A-fib   . Vitamin D deficiency   . Carotid artery occlusion   . Irregular heart beat    The following portions of the patient's history were reviewed and updated as appropriate: allergies, current medications, past family history, past medical history, past social history, past surgical history and problem list. No recent pap smear but had normal pap smears.  Never had a mammogram before.   Review of Systems:  Pertinent items are noted in HPI.  Objective:   Physical Exam   BP 140/52 mmHg  Pulse 86  Temp(Src) 98 F (36.7 C) (Oral)  Wt 188 lb 12.8 oz (85.639 kg) Gen: NAD Abd: Soft, nontender and nondistended Pelvic: Deferred today  Speculum exam (from 07/05/14): Vagina - Small amount of dark blood seen, no active bleeding Cervix - Difficult to fully visualize. Bimanual exam: Cervix closed and IUD string palpated. Client unaware she has an IUD. Thought the IUD was removed years ago. Uterus non tender, normal size Adnexa non tender, no masses bilaterally GC/Chlam, wet prep done Speculum reinserted and IUD strings grasped with Kelly clamp. Unable to remove IUD - Strings slipped through the [place] and then one string broke. IUD not removed.  Labs and Imaging 07/05/2014   CLINICAL DATA:  Postmenopausal female with vaginal bleeding. History of IUD in place for more than 20 years. The device could not be removed.  EXAM: TRANSABDOMINAL AND TRANSVAGINAL ULTRASOUND OF PELVIS  TECHNIQUE: Both transabdominal and transvaginal ultrasound examinations of the pelvis were performed. Transabdominal technique was performed for global imaging of the pelvis including uterus, ovaries, adnexal regions, and pelvic cul-de-sac. It was necessary to proceed with endovaginal exam following the transabdominal exam to visualize the endometrium.  COMPARISON:  None  FINDINGS: Uterus  Measurements: 6.3 x 3.0 x 3.5 cm. No fibroids or other mass visualized.  Endometrium  Thickness: 0.4 cm.  IUD is in place.  No focal abnormality.  Right ovary  Not visualized.  Left ovary  Not visualized.  Other findings  No free fluid.  IMPRESSION: No acute abnormality.  IUD is in place.  Ovaries were not visualized.   Electronically Signed   By: Inge Rise M.D.   On: 07/05/2014 13:41   Assessment & Plan:  Given that IUD has been in place for over 20 years and likely embedded/covered with fibrotic tissue within the endometrium, and failed removal attempt, I don't feel it is a good idea to  attempt in office removal.  Patient also needs further evaluation for her bleeding.  Recommended hysteroscopy, pap smear, D&C, IUD removal under anesthesia in the OR.  Patient is agreeable to this idea, does not want to undergo any painful in-office procedures.  The risks of surgery were discussed in detail with the patient including but not limited to: bleeding; infection; injury to uterus or other surrounding organs; need for additional procedures including laparoscopy;  and other postoperative or anesthesia complications.  Patient was told that the likelihood that her condition and symptoms will be treated effectively with this surgical management was very high; the postoperative expectations were also discussed in detail. The patient also understands the alternative treatment options which were discussed in full.  All questions were answered.  She was told that she will be contacted by our surgical scheduler regarding the time and date of her surgery; routine preoperative instructions of having nothing to eat or drink after midnight on the day prior to surgery and also coming to the hospital 1.5 hours prior to her time of surgery were also emphasized.  She was told she may be called for a preoperative appointment about a week prior to surgery and will be given further preoperative instructions at that visit. Printed patient education handouts about the procedure were given to the patient to review at home.  Given her cardiac history, she will need medical clearance. She will contact her cardiologist and set this up.  I am hesitant to stop anticoagulation at this point given her cardiac history. Once surgery is booked, will contact cardiologist about how to stop Xarelto around time of surgery; maybe cessation for two days +/- heparin bridge?  Routine preventative health maintenance measures emphasized. Will schedule mammogram for patient.    Cynthia Schneiders, MD, [place] Attending [place] for [employer], [place]

## 2014-07-13 NOTE — Patient Instructions (Signed)
Hysteroscopy Hysteroscopy is a procedure used for looking inside the womb (uterus). It may be done for various reasons, including:  To evaluate abnormal bleeding, fibroid (benign, noncancerous) tumors, polyps, scar tissue (adhesions), and possibly cancer of the uterus.  To look for lumps (tumors) and other uterine growths.  To look for causes of why a woman cannot get pregnant (infertility), causes of recurrent loss of pregnancy (miscarriages), or a lost intrauterine device (IUD).  To perform a sterilization by blocking the fallopian tubes from inside the uterus. In this procedure, a thin, flexible tube with a tiny light and camera on the end of it (hysteroscope) is used to look inside the uterus. A hysteroscopy should be done right after a menstrual period to be sure you are not pregnant. LET [facility] CARE PROVIDER KNOW ABOUT:   Any allergies you have.  All medicines you are taking, including vitamins, herbs, eye drops, creams, and over-the-counter medicines.  Previous problems you or members of your family have had with the use of anesthetics.  Any blood disorders you have.  Previous surgeries you have had.  Medical conditions you have. RISKS AND COMPLICATIONS  Generally, this is a safe procedure. However, as with any procedure, complications can occur. Possible complications include:  Putting a hole in the uterus.  Excessive bleeding.  Infection.  Damage to the cervix.  Injury to other organs.  Allergic reaction to medicines.  Too much fluid used in the uterus for the procedure. BEFORE THE PROCEDURE   Ask your health care provider about changing or stopping any regular medicines.  Do not take aspirin or blood thinners for 1 week before the procedure, or as directed by your health care provider. These can cause bleeding.  If you smoke, do not smoke for 2 weeks before the procedure.  In some cases, a medicine is placed in the cervix the day before the procedure.  This medicine makes the cervix have a larger opening (dilate). This makes it easier for the instrument to be inserted into the uterus during the procedure.  Do not eat or drink anything for at least 8 hours before the surgery.  Arrange for someone to take you home after the procedure. PROCEDURE   You may be given a medicine to relax you (sedative). You may also be given one of the following:  A medicine that numbs the area around the cervix (local anesthetic).  A medicine that makes you sleep through the procedure (general anesthetic).  The hysteroscope is inserted through the vagina into the uterus. The camera on the hysteroscope sends a picture to a TV screen. This gives the surgeon a good view inside the uterus.  During the procedure, air or a liquid is put into the uterus, which allows the surgeon to see better.  Sometimes, tissue is gently scraped from inside the uterus (D&C). These tissue samples are sent to a lab for testing.  IUD will also be removed at same time AFTER THE PROCEDURE   If you had a general anesthetic, you may be groggy for a couple hours after the procedure.  If you had a local anesthetic, you will be able to go home as soon as you are stable and feel ready.  You may have some cramping. This normally lasts for a couple days.  You may have bleeding, which varies from light spotting for a few days to menstrual-like bleeding for 3-7 days. This is normal.  If your test results are not back during the visit, make an  appointment with your health care provider to find out the results. Document Released: 08/28/2000 Document Revised: 03/12/2013 Document Reviewed: 12/19/2012 [facility] Patient Information 2015 [place], [place]. This information is not intended to replace advice given to you by your health care provider. Make sure you discuss any questions you have with your health care provider.

## 2014-07-14 LAB — COMPREHENSIVE METABOLIC PANEL
ALT: <8 U/L (ref 0–35)
AST: 10 U/L (ref 0–37)
Albumin: 4.3 g/dL (ref 3.5–5.2)
Alkaline Phosphatase: 66 U/L (ref 39–117)
BUN: 21 mg/dL (ref 6–23)
CHLORIDE: 101 meq/L (ref 96–112)
CO2: 24 meq/L (ref 19–32)
Calcium: 9.8 mg/dL (ref 8.4–10.5)
Creat: 1.46 mg/dL — ABNORMAL HIGH (ref 0.50–1.10)
Glucose, Bld: 131 mg/dL — ABNORMAL HIGH (ref 70–99)
Potassium: 5.8 mEq/L — ABNORMAL HIGH (ref 3.5–5.3)
SODIUM: 135 meq/L (ref 135–145)
TOTAL PROTEIN: 7 g/dL (ref 6.0–8.3)
Total Bilirubin: 0.3 mg/dL (ref 0.2–1.2)

## 2014-07-15 ENCOUNTER — Telehealth: Payer: Self-pay | Admitting: Interventional Cardiology

## 2014-07-15 ENCOUNTER — Telehealth: Payer: Self-pay | Admitting: General Practice

## 2014-07-15 NOTE — Telephone Encounter (Signed)
-----   Message from Osborne Oman, MD sent at 07/13/2014  5:25 PM EST ----- Regarding: Schedule mammogram Please schedule mammogram for patient. Thanks!  Laray Anger

## 2014-07-15 NOTE — Telephone Encounter (Signed)
Called patient and provided her with the number to call and schedule her mammogram. Patient verbalized understanding and states she appreciates that because she has a lot going on and that would be easy for her. Patient then asked when her surgery and pre op was. Told patient when her surgery was and that her surgery time and pre op information will be mailed to her as I do not have access to that information. Patient verbalized understanding and had no other questions

## 2014-07-15 NOTE — Telephone Encounter (Signed)
New message     Request for surgical clearance:  1. What type of surgery is being performed? Removal IUD/ DNC  2. When [facility] this [facility]    3. Are there any medications that need to be held prior to surgery and how long? xarelto   4. Name of physician performing surgery? Dr. Harolyn Rutherford   5. What is your office phone and fax number? [facility]

## 2014-07-16 NOTE — Telephone Encounter (Signed)
Called and informed pt that Dr. Irish Lack said it was ok to hold Xarelto 2 days prior to surgery. Informed pt that I would fax these orders to Dr. Arther Abbott office. Pt verbalized understanding and was in agreement with this plan.

## 2014-07-16 NOTE — Telephone Encounter (Signed)
OK to hold Xarelto prior to surgery.  No other cardiac testing required before this surgery.

## 2014-07-16 NOTE — Telephone Encounter (Signed)
Spoke with Dr. Irish Lack for verification on how long to hold. Dr. Irish Lack states that it is ok to hold Xarelto 2 days prior to surgery.

## 2014-07-24 ENCOUNTER — Encounter (HOSPITAL_COMMUNITY): Payer: Self-pay

## 2014-07-24 ENCOUNTER — Encounter (HOSPITAL_COMMUNITY)
Admission: RE | Admit: 2014-07-24 | Discharge: 2014-07-24 | Disposition: A | Discharge status: Home / Self Care | Payer: PPO | Source: Ambulatory Visit | Attending: Obstetrics & Gynecology | Admitting: Obstetrics & Gynecology

## 2014-07-24 ENCOUNTER — Other Ambulatory Visit: Payer: Self-pay

## 2014-07-24 ENCOUNTER — Ambulatory Visit (HOSPITAL_COMMUNITY)
Admission: RE | Admit: 2014-07-24 | Discharge: 2014-07-24 | Disposition: A | Discharge status: Home / Self Care | Payer: PPO | Source: Ambulatory Visit | Attending: Obstetrics & Gynecology | Admitting: Obstetrics & Gynecology

## 2014-07-24 DIAGNOSIS — I6529 Occlusion and stenosis of unspecified carotid artery: Secondary | ICD-10-CM | POA: Insufficient documentation

## 2014-07-24 DIAGNOSIS — E785 Hyperlipidemia, unspecified: Secondary | ICD-10-CM | POA: Insufficient documentation

## 2014-07-24 DIAGNOSIS — E119 Type 2 diabetes mellitus without complications: Secondary | ICD-10-CM | POA: Insufficient documentation

## 2014-07-24 DIAGNOSIS — N950 Postmenopausal bleeding: Secondary | ICD-10-CM | POA: Insufficient documentation

## 2014-07-24 DIAGNOSIS — I4891 Unspecified atrial fibrillation: Secondary | ICD-10-CM | POA: Diagnosis not present

## 2014-07-24 DIAGNOSIS — E559 Vitamin D deficiency, unspecified: Secondary | ICD-10-CM | POA: Insufficient documentation

## 2014-07-24 DIAGNOSIS — Z720 Tobacco use: Secondary | ICD-10-CM | POA: Diagnosis not present

## 2014-07-24 DIAGNOSIS — D509 Iron deficiency anemia, unspecified: Secondary | ICD-10-CM | POA: Diagnosis not present

## 2014-07-24 DIAGNOSIS — Z1231 Encounter for screening mammogram for malignant neoplasm of breast: Secondary | ICD-10-CM

## 2014-07-24 LAB — CBC
HCT: 36.9 % (ref 36.0–46.0)
Hemoglobin: 12 g/dL (ref 12.0–15.0)
MCH: 29.8 pg (ref 26.0–34.0)
MCHC: 32.5 g/dL (ref 30.0–36.0)
MCV: 91.6 fL (ref 78.0–100.0)
PLATELETS: 323 10*3/uL (ref 150–400)
RBC: 4.03 MIL/uL (ref 3.87–5.11)
RDW: 15.3 % (ref 11.5–15.5)
WBC: 9.8 10*3/uL (ref 4.0–10.5)

## 2014-07-24 LAB — BASIC METABOLIC PANEL
ANION GAP: 11 (ref 5–15)
BUN: 14 mg/dL (ref 6–23)
CO2: 26 mmol/L (ref 19–32)
CREATININE: 1.18 mg/dL — AB (ref 0.50–1.10)
Calcium: 9.4 mg/dL (ref 8.4–10.5)
Chloride: 105 mmol/L (ref 96–112)
GFR calc Af Amer: 52 mL/min — ABNORMAL LOW (ref >90)
GFR calc non Af Amer: 45 mL/min — ABNORMAL LOW (ref >90)
Glucose, Bld: 133 mg/dL — ABNORMAL HIGH (ref 70–99)
Potassium: 5 mmol/L (ref 3.5–5.1)
Sodium: 142 mmol/L (ref 135–145)

## 2014-07-24 NOTE — Patient Instructions (Signed)
   Your procedure is scheduled on: FEB 25 AT 730AM  Enter through the Main Entrance of [facility] at: [place] up the phone at the desk and dial [phone] and inform us of your arrival.  Please call this number if you have any problems the morning of surgery: [phone]  Remember: Do not eat food after midnight: FEB 24 Do not drink clear liquids after: FEB 24 Take these medicines the morning of surgery with a SIP OF WATER: PT HAS LIST OF MEDS TO TAKE AND NOT TAKE  Do not wear jewelry, make-up, or FINGER nail polish No metal in your hair or on your body. Do not wear lotions, powders, perfumes.  You may wear deodorant.  Do not bring valuables to the hospital. Contacts, dentures or bridgework may not be worn into surgery.      Patients discharged on the day of surgery will not be allowed to drive home.

## 2014-07-27 ENCOUNTER — Other Ambulatory Visit: Payer: Self-pay | Admitting: Obstetrics & Gynecology

## 2014-07-27 ENCOUNTER — Telehealth: Payer: Self-pay

## 2014-07-27 ENCOUNTER — Encounter: Payer: Self-pay | Admitting: Obstetrics & Gynecology

## 2014-07-27 DIAGNOSIS — R928 Other abnormal and inconclusive findings on diagnostic imaging of breast: Secondary | ICD-10-CM | POA: Insufficient documentation

## 2014-07-27 NOTE — Telephone Encounter (Signed)
-----   Message from Osborne Oman, MD sent at 07/27/2014  2:25 PM EST ----- Please ensure patient is scheduled for left diagnostic mammogram given abnormal screening mammogram

## 2014-07-27 NOTE — Progress Notes (Signed)
K+ elevated 3 weeks ago- repeat BMP done 3 days ago- K+ 5.0.

## 2014-07-27 NOTE — Progress Notes (Signed)
Did she have the BMet repeated anywhere after the high potassium?

## 2014-07-27 NOTE — Telephone Encounter (Signed)
Called patient to ensure she is scheduled for left diagnostic mammo-- patient states she is and the appointment is 08/03/14. No further questions or concerns.

## 2014-07-29 NOTE — Progress Notes (Signed)
Patient had a repeat BMP on 2/19 K+/ BUN/ creatinine- 5.0/ 14.1.18.

## 2014-07-30 ENCOUNTER — Encounter (HOSPITAL_COMMUNITY): Payer: Self-pay | Admitting: Anesthesiology

## 2014-07-30 ENCOUNTER — Ambulatory Visit (HOSPITAL_COMMUNITY): Payer: PPO | Admitting: Certified Registered Nurse Anesthetist

## 2014-07-30 ENCOUNTER — Ambulatory Visit (HOSPITAL_COMMUNITY)
Admission: RE | Admit: 2014-07-30 | Discharge: 2014-07-30 | Disposition: A | Discharge status: Home / Self Care | Payer: PPO | Source: Ambulatory Visit | Attending: Obstetrics & Gynecology | Admitting: Obstetrics & Gynecology

## 2014-07-30 ENCOUNTER — Encounter (HOSPITAL_COMMUNITY)
Admission: RE | Disposition: A | Discharge status: Home / Self Care | Payer: Self-pay | Source: Ambulatory Visit | Attending: Obstetrics & Gynecology

## 2014-07-30 DIAGNOSIS — F17210 Nicotine dependence, cigarettes, uncomplicated: Secondary | ICD-10-CM | POA: Insufficient documentation

## 2014-07-30 DIAGNOSIS — E559 Vitamin D deficiency, unspecified: Secondary | ICD-10-CM | POA: Insufficient documentation

## 2014-07-30 DIAGNOSIS — I6529 Occlusion and stenosis of unspecified carotid artery: Secondary | ICD-10-CM | POA: Insufficient documentation

## 2014-07-30 DIAGNOSIS — Z7901 Long term (current) use of anticoagulants: Secondary | ICD-10-CM | POA: Diagnosis not present

## 2014-07-30 DIAGNOSIS — E782 Mixed hyperlipidemia: Secondary | ICD-10-CM | POA: Diagnosis not present

## 2014-07-30 DIAGNOSIS — Z975 Presence of (intrauterine) contraceptive device: Secondary | ICD-10-CM | POA: Diagnosis not present

## 2014-07-30 DIAGNOSIS — I10 Essential (primary) hypertension: Secondary | ICD-10-CM | POA: Diagnosis not present

## 2014-07-30 DIAGNOSIS — I4891 Unspecified atrial fibrillation: Secondary | ICD-10-CM | POA: Diagnosis not present

## 2014-07-30 DIAGNOSIS — E119 Type 2 diabetes mellitus without complications: Secondary | ICD-10-CM | POA: Diagnosis not present

## 2014-07-30 DIAGNOSIS — Z30432 Encounter for removal of intrauterine contraceptive device: Secondary | ICD-10-CM | POA: Insufficient documentation

## 2014-07-30 DIAGNOSIS — D509 Iron deficiency anemia, unspecified: Secondary | ICD-10-CM | POA: Diagnosis not present

## 2014-07-30 DIAGNOSIS — N950 Postmenopausal bleeding: Secondary | ICD-10-CM | POA: Diagnosis not present

## 2014-07-30 HISTORY — PX: HYSTEROSCOPY W/D&C: SHX1775

## 2014-07-30 HISTORY — PX: IUD REMOVAL: SHX5392

## 2014-07-30 LAB — BASIC METABOLIC PANEL
Anion gap: 6 (ref 5–15)
BUN: 17 mg/dL (ref 6–23)
CO2: 22 mmol/L (ref 19–32)
Calcium: 9.3 mg/dL (ref 8.4–10.5)
Chloride: 109 mmol/L (ref 96–112)
Creatinine, Ser: 1.26 mg/dL — ABNORMAL HIGH (ref 0.50–1.10)
GFR calc Af Amer: 48 mL/min — ABNORMAL LOW (ref >90)
GFR, EST NON AFRICAN AMERICAN: 42 mL/min — AB (ref >90)
Glucose, Bld: 135 mg/dL — ABNORMAL HIGH (ref 70–99)
POTASSIUM: 4.9 mmol/L (ref 3.5–5.1)
Sodium: 137 mmol/L (ref 135–145)

## 2014-07-30 LAB — TYPE AND SCREEN
ABO/RH(D): O NEG
Antibody Screen: NEGATIVE

## 2014-07-30 LAB — ABO/RH: ABO/RH(D): O NEG

## 2014-07-30 LAB — GLUCOSE, CAPILLARY: GLUCOSE-CAPILLARY: 137 mg/dL — AB (ref 70–99)

## 2014-07-30 SURGERY — DILATATION AND CURETTAGE /HYSTEROSCOPY
Anesthesia: General | Site: Vagina

## 2014-07-30 MED ORDER — LACTATED RINGERS IV SOLN
INTRAVENOUS | Status: DC
  Administered 2014-07-30 (×2): via INTRAVENOUS

## 2014-07-30 MED ORDER — ONDANSETRON HCL 4 MG/2ML IJ SOLN
INTRAMUSCULAR | Status: AC
  Filled 2014-07-30: qty 2

## 2014-07-30 MED ORDER — EPHEDRINE SULFATE 50 MG/ML IJ SOLN
INTRAMUSCULAR | Status: DC | PRN
  Administered 2014-07-30 (×3): 5 mg via INTRAVENOUS

## 2014-07-30 MED ORDER — MIDAZOLAM HCL 2 MG/2ML IJ SOLN
INTRAMUSCULAR | Status: AC
  Filled 2014-07-30: qty 2

## 2014-07-30 MED ORDER — PROPOFOL 10 MG/ML IV BOLUS
INTRAVENOUS | Status: AC
  Filled 2014-07-30: qty 20

## 2014-07-30 MED ORDER — BUPIVACAINE HCL 0.5 % IJ SOLN
INTRAMUSCULAR | Status: DC | PRN
  Administered 2014-07-30: 30 mL

## 2014-07-30 MED ORDER — HYDROCODONE-ACETAMINOPHEN 5-325 MG PO TABS: 1.0000 | ORAL_TABLET | Freq: Four times a day (QID) | ORAL | Status: DC | PRN

## 2014-07-30 MED ORDER — MIDAZOLAM HCL 2 MG/2ML IJ SOLN
INTRAMUSCULAR | Status: DC | PRN
  Administered 2014-07-30: 1 mg via INTRAVENOUS

## 2014-07-30 MED ORDER — ONDANSETRON HCL 4 MG/2ML IJ SOLN
INTRAMUSCULAR | Status: DC | PRN
  Administered 2014-07-30: 4 mg via INTRAVENOUS

## 2014-07-30 MED ORDER — OXYCODONE HCL 5 MG/5ML PO SOLN: 5.0000 mg | Freq: Once | ORAL | Status: DC | PRN

## 2014-07-30 MED ORDER — FENTANYL CITRATE 0.05 MG/ML IJ SOLN: 25.0000 ug | INTRAMUSCULAR | Status: DC | PRN

## 2014-07-30 MED ORDER — LIDOCAINE HCL (CARDIAC) 20 MG/ML IV SOLN
INTRAVENOUS | Status: AC
  Filled 2014-07-30: qty 5

## 2014-07-30 MED ORDER — MEPERIDINE HCL 25 MG/ML IJ SOLN: 6.2500 mg | INTRAMUSCULAR | Status: DC | PRN

## 2014-07-30 MED ORDER — DOCUSATE SODIUM 100 MG PO CAPS: 100.0000 mg | ORAL_CAPSULE | Freq: Two times a day (BID) | ORAL | Status: DC | PRN

## 2014-07-30 MED ORDER — EPHEDRINE 5 MG/ML INJ
INTRAVENOUS | Status: AC
  Filled 2014-07-30: qty 10

## 2014-07-30 MED ORDER — OXYCODONE HCL 5 MG PO TABS: 5.0000 mg | ORAL_TABLET | Freq: Once | ORAL | Status: DC | PRN

## 2014-07-30 MED ORDER — HYDROCODONE-ACETAMINOPHEN 5-325 MG PO TABS
1.0000 | ORAL_TABLET | Freq: Once | ORAL | Status: AC
  Administered 2014-07-30: 1 via ORAL

## 2014-07-30 MED ORDER — LACTATED RINGERS IV SOLN: INTRAVENOUS | Status: DC

## 2014-07-30 MED ORDER — FENTANYL CITRATE 0.05 MG/ML IJ SOLN
INTRAMUSCULAR | Status: DC | PRN
  Administered 2014-07-30: 25 ug via INTRAVENOUS

## 2014-07-30 MED ORDER — BUPIVACAINE HCL (PF) 0.5 % IJ SOLN
INTRAMUSCULAR | Status: AC
  Filled 2014-07-30: qty 30

## 2014-07-30 MED ORDER — LIDOCAINE HCL (CARDIAC) 20 MG/ML IV SOLN
INTRAVENOUS | Status: DC | PRN
  Administered 2014-07-30: 40 mg via INTRAVENOUS

## 2014-07-30 MED ORDER — ONDANSETRON HCL 4 MG/2ML IJ SOLN: 4.0000 mg | Freq: Once | INTRAMUSCULAR | Status: DC | PRN

## 2014-07-30 MED ORDER — PROPOFOL 10 MG/ML IV BOLUS
INTRAVENOUS | Status: DC | PRN
  Administered 2014-07-30: 120 mg via INTRAVENOUS

## 2014-07-30 MED ORDER — HYDROCODONE-ACETAMINOPHEN 5-325 MG PO TABS
ORAL_TABLET | ORAL | Status: AC
  Administered 2014-07-30: 1 via ORAL
  Filled 2014-07-30: qty 1

## 2014-07-30 MED ORDER — FENTANYL CITRATE 0.05 MG/ML IJ SOLN
INTRAMUSCULAR | Status: AC
  Filled 2014-07-30: qty 2

## 2014-07-30 SURGICAL SUPPLY — 19 items
CANISTER SUCT 3000ML (MISCELLANEOUS) ×3 IMPLANT
CATH ROBINSON RED A/P 16FR (CATHETERS) IMPLANT
CLOTH BEACON ORANGE TIMEOUT ST (SAFETY) ×3 IMPLANT
CONTAINER PREFILL 10% NBF 60ML (FORM) ×3 IMPLANT
DRAPE SHEET LG 3/4 BI-LAMINATE (DRAPES) IMPLANT
GLOVE BIO SURGEON STRL SZ 6.5 (GLOVE) ×2 IMPLANT
GLOVE BIO SURGEONS STRL SZ 6.5 (GLOVE) ×1
GLOVE BIOGEL PI IND STRL 7.0 (GLOVE) ×2 IMPLANT
GLOVE BIOGEL PI INDICATOR 7.0 (GLOVE) ×4
GLOVE ECLIPSE 7.0 STRL STRAW (GLOVE) ×12 IMPLANT
GOWN STRL REUS W/TWL LRG LVL3 (GOWN DISPOSABLE) ×9 IMPLANT
LOOP ANGLED CUTTING 22FR (CUTTING LOOP) IMPLANT
NEEDLE SPNL 22GX3.5 QUINCKE BK (NEEDLE) IMPLANT
PACK VAGINAL MINOR WOMEN LF (CUSTOM PROCEDURE TRAY) ×3 IMPLANT
PAD OB MATERNITY 4.3X12.25 (PERSONAL CARE ITEMS) ×3 IMPLANT
TOWEL OR 17X24 6PK STRL BLUE (TOWEL DISPOSABLE) ×6 IMPLANT
TUBING AQUILEX INFLOW (TUBING) ×3 IMPLANT
TUBING AQUILEX OUTFLOW (TUBING) ×3 IMPLANT
WATER STERILE IRR 1000ML POUR (IV SOLUTION) ×3 IMPLANT

## 2014-07-30 NOTE — Anesthesia Preprocedure Evaluation (Signed)
Anesthesia Evaluation    Reviewed: Allergy & Precautions, H&P , NPO status , Patient's Chart, lab work & pertinent test results, reviewed documented beta blocker date and time   Airway   TM Distance: >3 FB Neck ROM: full    Dental no notable dental hx. (+) Teeth Intact   Pulmonary neg pulmonary ROS, Current Smoker,    Pulmonary exam normal       Cardiovascular     Neuro/Psych negative neurological ROS     GI/Hepatic negative GI ROS, Neg liver ROS,   Endo/Other  diabetes  Renal/GU negative Renal ROS     Musculoskeletal   Abdominal Normal abdominal exam  (+)   Peds  Hematology negative hematology ROS (+)   Anesthesia Other Findings   Reproductive/Obstetrics negative OB ROS                             Anesthesia Physical Anesthesia Plan  ASA: II  Anesthesia Plan: General   Post-op Pain Management:    Induction: Intravenous  Airway Management Planned: LMA  Additional Equipment:   Intra-op Plan:   Post-operative Plan:   Informed Consent: I have reviewed the patients History and Physical, chart, labs and discussed the procedure including the risks, benefits and alternatives for the proposed anesthesia with the patient or authorized representative who has indicated his/her understanding and acceptance.   Dental Advisory Given  Plan Discussed with: CRNA and Surgeon  Anesthesia Plan Comments:         Anesthesia Quick Evaluation

## 2014-07-30 NOTE — Progress Notes (Signed)
ekg ok for surgery per dr foster

## 2014-07-30 NOTE — Discharge Instructions (Signed)
You can restart Xarelto at 8 pm tonight  Hysteroscopy, Care After Refer to this sheet in the next few weeks. These instructions provide you with information on caring for yourself after your procedure. Your health care provider may also give you more specific instructions. Your treatment has been planned according to current medical practices, but problems sometimes occur. Call your health care provider if you have any problems or questions after your procedure.  WHAT TO EXPECT AFTER THE PROCEDURE After your procedure, it is typical to have the following:  You may have some cramping. This normally lasts for a couple days.  You may have bleeding. This can vary from light spotting for a few days to menstrual-like bleeding for 3-7 days. HOME CARE INSTRUCTIONS  Rest for the first 1-2 days after the procedure.  Only take over-the-counter or prescription medicines as directed by your health care provider. Do not take aspirin. It can increase the chances of bleeding.  Take showers instead of baths for 2 weeks or as directed by your health care provider.  Do not drive for 24 hours or as directed.  Do not drink alcohol while taking pain medicine.  Do not use tampons, douche, or have sexual intercourse for 2 weeks or until your health care provider says it is okay.  Take your temperature twice a day for 4-5 days. Write it down each time.  Follow your health care provider's advice about diet, exercise, and lifting.  If you develop constipation, you may:  Take a mild laxative if your health care provider approves.  Add bran foods to your diet.  Drink enough fluids to keep your urine clear or pale yellow.  Try to have someone with you or available to you for the first 24-48 hours, especially if you were given a general anesthetic.  Follow up with your health care provider as directed. SEEK MEDICAL CARE IF:  You feel dizzy or lightheaded.  You feel sick to your stomach (nauseous).  You  have abnormal vaginal discharge.  You have a rash.  You have pain that is not controlled with medicine. SEEK IMMEDIATE MEDICAL CARE IF:  You have bleeding that is heavier than a normal menstrual period.  You have a fever.  You have increasing cramps or pain, not controlled with medicine.  You have new belly (abdominal) pain.  You pass out.  You have pain in the tops of your shoulders (shoulder strap areas).  You have shortness of breath. Document Released: 03/12/2013 Document Reviewed: 03/12/2013 [facility] Patient Information 2015 [place], [place]. This information is not intended to replace advice given to you by your health care provider. Make sure you discuss any questions you have with your health care provider. DISCHARGE INSTRUCTIONS: D&C / D&E The following instructions have been prepared to help you care for yourself upon your return home.   Personal hygiene:  Use sanitary pads for vaginal drainage, not tampons.  Shower the day after your procedure.  NO tub baths, pools or Jacuzzis for 2-3 weeks.  Wipe front to back after using the bathroom.  Activity and limitations:  Do NOT drive or operate any equipment for 24 hours. The effects of anesthesia are still present and drowsiness may result.  Do NOT rest in bed all day.  Walking is encouraged.  Walk up and down stairs slowly.  You may resume your normal activity in one to two days or as indicated by your physician.  Sexual activity: NO intercourse for at least 2 weeks after the procedure,  or as indicated by your physician.  Diet: Eat a light meal as desired this evening. You may resume your usual diet tomorrow.  Return to work: You may resume your work activities in one to two days or as indicated by your doctor.  What to expect after your surgery: Expect to have vaginal bleeding/discharge for 2-3 days and spotting for up to 10 days. It is not unusual to have soreness for up to 1-2 weeks. You may have a slight  burning sensation when you urinate for the first day. Mild cramps may continue for a couple of days. You may have a regular period in 2-6 weeks.  Call your doctor for any of the following:  Excessive vaginal bleeding, saturating and changing one pad every hour.  Inability to urinate 6 hours after discharge from hospital.  Pain not relieved by pain medication.  Fever of 100.4 F or greater.  Unusual vaginal discharge or odor.   Call for an appointment:    Patients signature: ______________________  Nurses signature ________________________  Support person's signature_______________________

## 2014-07-30 NOTE — H&P (View-Only) (Signed)
CLINIC ENCOUNTER NOTE  History:  74 y.o. G1P1001 here today for discussion of evaluation and management of postmenopausal bleeding and retained IUD for over 20 years s/p unsuccessful removal attempt in [facility] on 07/05/14.  As per Earlie Server, NP notes on 07/05/14: Baldemar Lenis 75 y.o. Comes to [facility]U with 2 episodes today of a large amount of painless red vaginal bleeding. Has not had vaginal bleeding since menopause in her early 54's. Had regular monthly periods which were not heavy and did not have cramping. Did have one very heavy period in her early 86's and went to see the doctor. Does not remember who that was. Is concerned about the bleeding as her mother had uterine cancer. Has not had a pelvic exam in years - no recent pap. Takes Xarelto since her carotid endarterectomy.  Patient Active Problem List   Diagnosis Date Noted  . Postmenopausal bleeding 07/13/2014  . IUD (intrauterine device) in place for over 20 years 07/13/2014  . Carotid stenosis 04/10/2014  . Aftercare following surgery of the circulatory system 04/10/2014  . Essential hypertension, benign 07/08/2013  . Mixed hyperlipidemia 07/08/2013  . Tobacco use disorder 07/08/2013  . Occlusion and stenosis of carotid artery without mention of cerebral infarction 08/01/2012  . Iron deficiency anemia   . Diabetes mellitus   . Hyperlipidemia   . A-fib   . Vitamin d deficiency    Past Medical History  Diagnosis Date  . Diabetes mellitus   . Hyperlipidemia   . A-fib   . Vitamin D deficiency   . Carotid artery occlusion   . Irregular heart beat    The following portions of the patient's history were reviewed and updated as appropriate: allergies, current medications, past family history, past medical history, past social history, past surgical history and problem list. No recent pap smear but had normal pap smears.  Never had a mammogram before.   Review of Systems:  Pertinent items are noted in HPI.  Objective:   Physical Exam   BP 140/52 mmHg  Pulse 86  Temp(Src) 98 F (36.7 C) (Oral)  Wt 188 lb 12.8 oz (85.639 kg) Gen: NAD Abd: Soft, nontender and nondistended Pelvic: Deferred today  Speculum exam (from 07/05/14): Vagina - Small amount of dark blood seen, no active bleeding Cervix - Difficult to fully visualize. Bimanual exam: Cervix closed and IUD string palpated. Client unaware she has an IUD. Thought the IUD was removed years ago. Uterus non tender, normal size Adnexa non tender, no masses bilaterally GC/Chlam, wet prep done Speculum reinserted and IUD strings grasped with Kelly clamp. Unable to remove IUD - Strings slipped through the [place] and then one string broke. IUD not removed.  Labs and Imaging 07/05/2014   CLINICAL DATA:  Postmenopausal female with vaginal bleeding. History of IUD in place for more than 20 years. The device could not be removed.  EXAM: TRANSABDOMINAL AND TRANSVAGINAL ULTRASOUND OF PELVIS  TECHNIQUE: Both transabdominal and transvaginal ultrasound examinations of the pelvis were performed. Transabdominal technique was performed for global imaging of the pelvis including uterus, ovaries, adnexal regions, and pelvic cul-de-sac. It was necessary to proceed with endovaginal exam following the transabdominal exam to visualize the endometrium.  COMPARISON:  None  FINDINGS: Uterus  Measurements: 6.3 x 3.0 x 3.5 cm. No fibroids or other mass visualized.  Endometrium  Thickness: 0.4 cm.  IUD is in place.  No focal abnormality.  Right ovary  Not visualized.  Left ovary  Not visualized.  Other findings  No free fluid.  IMPRESSION: No acute abnormality.  IUD is in place.  Ovaries were not visualized.   Electronically Signed   By: Inge Rise M.D.   On: 07/05/2014 13:41   Assessment & Plan:  Given that IUD has been in place for over 20 years and likely embedded/covered with fibrotic tissue within the endometrium, and failed removal attempt, I don't feel it is a good idea to  attempt in office removal.  Patient also needs further evaluation for her bleeding.  Recommended hysteroscopy, pap smear, D&C, IUD removal under anesthesia in the OR.  Patient is agreeable to this idea, does not want to undergo any painful in-office procedures.  The risks of surgery were discussed in detail with the patient including but not limited to: bleeding; infection; injury to uterus or other surrounding organs; need for additional procedures including laparoscopy;  and other postoperative or anesthesia complications.  Patient was told that the likelihood that her condition and symptoms will be treated effectively with this surgical management was very high; the postoperative expectations were also discussed in detail. The patient also understands the alternative treatment options which were discussed in full.  All questions were answered.  She was told that she will be contacted by our surgical scheduler regarding the time and date of her surgery; routine preoperative instructions of having nothing to eat or drink after midnight on the day prior to surgery and also coming to the hospital 1.5 hours prior to her time of surgery were also emphasized.  She was told she may be called for a preoperative appointment about a week prior to surgery and will be given further preoperative instructions at that visit. Printed patient education handouts about the procedure were given to the patient to review at home.  Given her cardiac history, she will need medical clearance. She will contact her cardiologist and set this up.  I am hesitant to stop anticoagulation at this point given her cardiac history. Once surgery is booked, will contact cardiologist about how to stop Xarelto around time of surgery; maybe cessation for two days +/- heparin bridge?  Routine preventative health maintenance measures emphasized. Will schedule mammogram for patient.    Verita Schneiders, MD, [place] Attending [place] for [employer], [place]

## 2014-07-30 NOTE — Anesthesia Procedure Notes (Addendum)
Procedure Name: LMA Insertion Date/Time: 07/30/2014 7:26 AM Performed by: France Ravens HRISTOVA Pre-anesthesia Checklist: Patient identified, Emergency Drugs available, Suction available, Patient being monitored and Timeout performed Patient Re-evaluated:Patient Re-evaluated prior to inductionOxygen Delivery Method: Circle system utilized Preoxygenation: Pre-oxygenation with 100% oxygen Intubation Type: IV induction LMA: LMA inserted LMA Size: 4.0 Number of attempts: 1 Placement Confirmation: positive ETCO2,  breath sounds checked- equal and bilateral and CO2 detector Tube secured with: Tape Dental Injury: Teeth and Oropharynx as per pre-operative assessment

## 2014-07-30 NOTE — Transfer of Care (Signed)
Immediate Anesthesia Transfer of Care Note  Patient: Cynthia Hansen  Procedure(s) Performed: Procedure(s): DILATATION AND CURETTAGE /HYSTEROSCOPY (N/A) INTRAUTERINE DEVICE (IUD) REMOVAL (N/A)  Patient Location: PACU  Anesthesia Type:General  Level of Consciousness: awake, alert , oriented and patient cooperative  Airway & Oxygen Therapy: Patient Spontanous Breathing and Patient connected to nasal cannula oxygen  Post-op Assessment: Report given to RN, Post -op Vital signs reviewed and stable and Patient moving all extremities  Post vital signs: Reviewed and stable  Last Vitals:  Filed Vitals:   07/30/14 0610  BP: 138/63  Pulse:   Temp:   Resp:     Complications: No apparent anesthesia complications

## 2014-07-30 NOTE — Op Note (Addendum)
PREOPERATIVE DIAGNOSIS:  Postmenopausal bleeding, retained IUD for over 20 years POSTOPERATIVE DIAGNOSIS: The same PROCEDURE: Hysteroscopy, Dilation and Curettage, IUD removal, pap smear. SURGEON:  Dr. Verita Schneiders ANESTHESIOLOGIST: Dr. Lyn Hollingshead   INDICATIONS: 74 y.o. G1P1001 here for scheduled surgery for the aforementioned diagnoses.   The risks of surgery were discussed in detail with the patient including but not limited to: bleeding; infection; injury to uterus or other surrounding organs; need for additional procedures including laparoscopy;and other postoperative or anesthesia complications.  Written informed consent was obtained.    FINDINGS:  A 6 week size uterus.  L shaped plastic IUD visualized and removed in its entirety. Atrophic endometrium. Pap smear done.  ANESTHESIA:   General, paracervical block with 30 ml of 0.5% Marcaine INTRAVENOUS FLUIDS:  1000 ml of LR FLUID DEFICITS:  75 ml of NS ESTIMATED BLOOD LOSS:  Less than 20 ml SPECIMENS: Endometrial curettings sent to pathology COMPLICATIONS:  None immediate.  PROCEDURE DETAILS:  The patient received intravenous antibiotics while in the preoperative area.  She was then taken to the operating room where general anesthesia was administered and was found to be adequate.  After an adequate timeout was performed, she was placed in the dorsal lithotomy position and examined; then prepped and draped in the sterile manner.   Her bladder was catheterized for an unmeasured amount of clear, yellow urine. A speculum was then placed in the patient's vagina and a single tooth tenaculum was applied to the anterior lip of the cervix.   Pap smear sample was obtained.  A paracervical block using 30 ml of 0.5% Marcaine was administered.  The cervix was sounded to 6 cm and dilated manually with metal dilators to accommodate the 2.5 mm diagnostic hysteroscope.  Once the cervix was dilated, the hysteroscope was inserted under direct  visualization using NS as a suspension medium.  The uterine cavity was carefully examined with the findings as noted above.  The IUD was grasped and removed intact from the uterus.  After further careful visualization of the uterine cavity, the hysteroscope was removed under direct visualization.  A sharp curettage was then performed to obtain a small amount of endometrial curettings.  The tenaculum was removed from the anterior lip of the cervix and the vaginal speculum was removed after noting good hemostasis.  The patient tolerated the procedure well and was taken to the recovery area awake, extubated and in stable condition.  The patient will be discharged to home as per PACU criteria.  Routine postoperative instructions given.  She was prescribed Vicodin and and Colace; NSAIDs avoided given preoperative Cr of 1.26.  She will follow up in the clinic on 08/21/14 for postoperative evaluation.   Verita Schneiders, MD, [place] Attending [place], [facility]

## 2014-07-30 NOTE — Interval H&P Note (Signed)
History and Physical Interval Note  07/30/2014 6:58 AM  Cynthia Hansen has presented today for surgery, with the diagnosis of Postmenopausal bleeding and retained IUD for over 20 years  The various methods of treatment have been discussed with the patient and family. After consideration of risks, benefits and other options for treatment, the patient has consented to [place] (IUD) REMOVAL as a surgical intervention .  The patient's history has been reviewed, patient examined, no change in status, stable for surgery.  I have reviewed the patient's chart and labs.  Questions were answered to the patient's satisfaction.  To OR when ready.   Verita Schneiders, MD, [place] Attending [place], [facility]

## 2014-07-30 NOTE — Anesthesia Postprocedure Evaluation (Signed)
Anesthesia Post Note  Patient: Cynthia Hansen  Procedure(s) Performed: Procedure(s) (LRB): DILATATION AND CURETTAGE /HYSTEROSCOPY (N/A) INTRAUTERINE DEVICE (IUD) REMOVAL (N/A)  Anesthesia type: General  Patient location: PACU  Post pain: Pain level controlled  Post assessment: Post-op Vital signs reviewed  Last Vitals:  Filed Vitals:   07/30/14 0922  BP:   Pulse: 72  Temp: 36.6 C  Resp:     Post vital signs: Reviewed  Level of consciousness: sedated  Complications: No apparent anesthesia complications

## 2014-07-31 ENCOUNTER — Encounter (HOSPITAL_COMMUNITY): Payer: Self-pay | Admitting: Obstetrics & Gynecology

## 2014-07-31 LAB — CYTOLOGY - PAP

## 2014-08-03 ENCOUNTER — Ambulatory Visit
Admission: RE | Admit: 2014-08-03 | Discharge: 2014-08-03 | Disposition: A | Discharge status: Home / Self Care | Payer: PPO | Source: Ambulatory Visit | Attending: Obstetrics & Gynecology | Admitting: Obstetrics & Gynecology

## 2014-08-03 ENCOUNTER — Telehealth: Payer: Self-pay | Admitting: *Deleted

## 2014-08-03 ENCOUNTER — Other Ambulatory Visit: Payer: Self-pay | Admitting: Obstetrics & Gynecology

## 2014-08-03 DIAGNOSIS — R928 Other abnormal and inconclusive findings on diagnostic imaging of breast: Secondary | ICD-10-CM

## 2014-08-03 NOTE — Telephone Encounter (Signed)
-----   Message from Osborne Oman, MD sent at 08/03/2014 12:40 PM EST ----- Benign endometrial curettings and normal pap smear.  No further action needed at this point.  Patient should call clinic if bleeding continues or worsens. Please call to inform patient of results and recommendations.

## 2014-08-03 NOTE — Telephone Encounter (Signed)
Contacted patient, results given, pt verbalizes understanding, no further questions.

## 2014-08-16 ENCOUNTER — Other Ambulatory Visit: Payer: Self-pay | Admitting: Interventional Cardiology

## 2014-08-20 ENCOUNTER — Other Ambulatory Visit: Payer: Medicare HMO

## 2014-08-20 NOTE — Progress Notes (Signed)
Did she have bmet repeated?

## 2014-08-21 ENCOUNTER — Ambulatory Visit: Payer: PPO | Admitting: Obstetrics & Gynecology

## 2014-08-24 ENCOUNTER — Telehealth: Payer: Self-pay | Admitting: *Deleted

## 2014-08-24 DIAGNOSIS — I4891 Unspecified atrial fibrillation: Secondary | ICD-10-CM

## 2014-08-24 NOTE — Progress Notes (Signed)
BMET scheduled for 08/26/14

## 2014-08-24 NOTE — Telephone Encounter (Signed)
Spoke with pt and made her aware that Dr. Irish Lack would like for her to have follow up labs. Made appt for 08/26/14. Pt verbalized understanding and was in agreement with this plan.

## 2014-08-24 NOTE — Telephone Encounter (Signed)
-----   Message from Jettie Booze, MD sent at 08/20/2014  9:06 PM EDT ----- Did bmet get repeated?

## 2014-08-26 ENCOUNTER — Other Ambulatory Visit (INDEPENDENT_AMBULATORY_CARE_PROVIDER_SITE_OTHER): Payer: PPO | Admitting: *Deleted

## 2014-08-26 DIAGNOSIS — I4891 Unspecified atrial fibrillation: Secondary | ICD-10-CM

## 2014-08-26 LAB — BASIC METABOLIC PANEL
BUN: 16 mg/dL (ref 6–23)
CO2: 29 meq/L (ref 19–32)
CREATININE: 1.18 mg/dL (ref 0.40–1.20)
Calcium: 9.7 mg/dL (ref 8.4–10.5)
Chloride: 102 mEq/L (ref 96–112)
GFR: 47.78 mL/min — ABNORMAL LOW (ref >60.00)
Glucose, Bld: 120 mg/dL — ABNORMAL HIGH (ref 70–99)
Potassium: 4.7 mEq/L (ref 3.5–5.1)
Sodium: 137 mEq/L (ref 135–145)

## 2014-08-26 NOTE — Addendum Note (Signed)
Addended by: Eulis Foster on: 08/26/2014 08:49 AM   Modules accepted: Orders

## 2014-08-27 ENCOUNTER — Other Ambulatory Visit: Payer: Self-pay | Admitting: Interventional Cardiology

## 2014-09-02 ENCOUNTER — Telehealth: Payer: Self-pay | Admitting: Interventional Cardiology

## 2014-09-02 ENCOUNTER — Ambulatory Visit (INDEPENDENT_AMBULATORY_CARE_PROVIDER_SITE_OTHER): Payer: PPO | Admitting: Obstetrics & Gynecology

## 2014-09-02 ENCOUNTER — Encounter: Payer: Self-pay | Admitting: Obstetrics & Gynecology

## 2014-09-02 VITALS — BP 154/61 | HR 71 | Temp 98.2°F | Wt 187.5 lb

## 2014-09-02 DIAGNOSIS — Z9889 Other specified postprocedural states: Secondary | ICD-10-CM

## 2014-09-02 DIAGNOSIS — Z09 Encounter for follow-up examination after completed treatment for conditions other than malignant neoplasm: Secondary | ICD-10-CM

## 2014-09-02 NOTE — Telephone Encounter (Signed)
PT WANTS TO GO OFF THE STATIN MED, GOING OFF ANYWAY BUT SAYS IF WE CAN CHANGE IT TO SOMETHING ELSE SHE WILL TRY IT, GOING TO AN APPT AT 1245 IF CAN CALL BEFORE 12 OR AFTER 2

## 2014-09-02 NOTE — Patient Instructions (Signed)
Return to clinic for any scheduled appointments or for any gynecologic concerns as needed.   

## 2014-09-02 NOTE — Progress Notes (Signed)
   CLINIC ENCOUNTER NOTE  History:  74 y.o. G1P1001 here today for follow up after Hysteroscopy, Dilation and Curettage, IUD removal, pap smear on 07/30/14. She had no bleeding or cramping after procedure, no problems.  Very happy today!  The following portions of the patient's history were reviewed and updated as appropriate: allergies, current medications, past family history, past medical history, past social history, past surgical history and problem list. Normal pap and negative HRHPV on 07/30/14.  Normal mammogram on 08/03/14.  Normal colonoscopy in 06/2013.   Review of Systems:  Comprehensive review of systems was negative.  Objective:  Physical Exam BP 154/61 mmHg  Pulse 71  Temp(Src) 98.2 F (36.8 C) (Oral)  Wt 187 lb 8 oz (85.049 kg) Constitutional: Well-developed female in noo acute distress.  HEENT: Normocephalic, atraumatic, sclerae anicteric.  Mental: A&O x 3, stable mood Cardiovascular: Normal heart rate noted Respiratory: No problems with respiration noted, normal respiratory effort Abdomen: Soft, nontender Pelvic: Deferred  Labs and Imaging 07/30/14 Benign endometrial curettings and normal pap smear.  Assessment & Plan:  Surgical pathology reviewed, patient reassured Postmenopausal bleeding precautions reviewed Routine preventative health maintenance measures emphasized.  Verita Schneiders, MD, [place] Attending [place] for [employer], [place]

## 2014-09-03 NOTE — Telephone Encounter (Signed)
Any ideas if she is not tolerating pravastatin. She has PAD, prior carotid surgery.

## 2014-09-03 NOTE — Telephone Encounter (Signed)
Informed pt that she can take plain Mucinex but not the Mucinex D. Pt currently on Pravastatin 40mg - Take 1/2 tab daily. Pt states that medication is causing her muscle aches that she just can't stand anymore. Pt states that back hurts and it hurts to walk. Pt would like to be switched to a different medication if possible. Informed pt that Dr. Irish Lack was out of the office today but that I would route this information to him for review and advisement. Pt verbalized understanding and was in agreement with this plan.

## 2014-09-03 NOTE — Telephone Encounter (Signed)
Informed pt of recommendation to try Crestor 5mg  three days a week. Informed pt to hold pravastatin x 1 week before starting the Crestor. Pt agreeable to plan. Pt states that she will stop Pravastatin today and call me next week with how she is feeling and let me know whether or not to send in prescription for Crestor. Informed pt that would be fine and to call me Wednesday or Thursday with an update. Pt verbalized understanding and was in agreement with this plan.

## 2014-09-03 NOTE — Telephone Encounter (Signed)
Please follow Sally's recs

## 2014-09-03 NOTE — Telephone Encounter (Signed)
I do not know what she has tried in the past, but would try Crestor 5 three days of the week and see if she is able to tolerate. Would hold pravastatin for ~1 week to se if muscle aches improve before starting Crestor.

## 2014-09-03 NOTE — Telephone Encounter (Signed)
Follow up      Pt is calling to see what the doctor said about stopping statin drug.  Also, can she take mucinex-D?

## 2014-09-09 MED ORDER — ROSUVASTATIN CALCIUM 5 MG PO TABS: ORAL_TABLET | ORAL | Status: DC

## 2014-09-09 NOTE — Telephone Encounter (Signed)
Pt calling re samples up front--not there-pls call on cell

## 2014-09-09 NOTE — Telephone Encounter (Signed)
Informed pt that we will not be getting anymore samples of Crestor because it will be going to generic in May. Informed pt that I would send prescription over to verified pharmacy. Pt verbalized understanding and was in agreement with this plan.

## 2014-09-11 ENCOUNTER — Telehealth: Payer: Self-pay | Admitting: Interventional Cardiology

## 2014-09-11 ENCOUNTER — Telehealth: Payer: Self-pay | Admitting: Physician Assistant

## 2014-09-11 NOTE — Telephone Encounter (Signed)
New Message  Pt wanted to speak w/ RN about 5mg  Crestor. Please call back and discuss.

## 2014-09-11 NOTE — Telephone Encounter (Signed)
I spoke with the pt and she picked up her Crestor prescription and it was $45 for 15 pills.  The pt is very upset about the cost of this medication. The pt states that she contacted her insurance company and after being transferred 3 times they advised her that the pharmacy could refund her money if they run her Rx as a trial.  The pt requested that I contact her pharmacy so that she could obtain a refund.  I spoke with Ray the pharmacist at [facility] and he said this information is incorrect and they cannot run a Rx as a trial.  I made the pt aware of this information and she will take her supply of Crestor and determine if she can tolerate this medication.

## 2014-09-13 ENCOUNTER — Other Ambulatory Visit: Payer: Self-pay | Admitting: Interventional Cardiology

## 2014-10-04 ENCOUNTER — Other Ambulatory Visit: Payer: Self-pay | Admitting: Interventional Cardiology

## 2014-10-20 ENCOUNTER — Encounter: Payer: Self-pay | Admitting: Family

## 2014-10-22 ENCOUNTER — Encounter: Payer: Self-pay | Admitting: Family

## 2014-10-22 ENCOUNTER — Other Ambulatory Visit: Payer: Self-pay | Admitting: Family

## 2014-10-22 ENCOUNTER — Telehealth: Payer: Self-pay | Admitting: Interventional Cardiology

## 2014-10-22 ENCOUNTER — Ambulatory Visit (HOSPITAL_COMMUNITY)
Admission: RE | Admit: 2014-10-22 | Discharge: 2014-10-22 | Disposition: A | Discharge status: Home / Self Care | Payer: PPO | Source: Ambulatory Visit | Attending: Family | Admitting: Family

## 2014-10-22 ENCOUNTER — Ambulatory Visit (INDEPENDENT_AMBULATORY_CARE_PROVIDER_SITE_OTHER): Payer: PPO | Admitting: Family

## 2014-10-22 VITALS — BP 112/59 | HR 75 | Resp 16 | Ht 69.0 in | Wt 186.0 lb

## 2014-10-22 DIAGNOSIS — Z720 Tobacco use: Secondary | ICD-10-CM

## 2014-10-22 DIAGNOSIS — Z48812 Encounter for surgical aftercare following surgery on the circulatory system: Secondary | ICD-10-CM | POA: Insufficient documentation

## 2014-10-22 DIAGNOSIS — R200 Anesthesia of skin: Secondary | ICD-10-CM | POA: Insufficient documentation

## 2014-10-22 DIAGNOSIS — Z9889 Other specified postprocedural states: Secondary | ICD-10-CM | POA: Diagnosis not present

## 2014-10-22 DIAGNOSIS — I6521 Occlusion and stenosis of right carotid artery: Secondary | ICD-10-CM | POA: Insufficient documentation

## 2014-10-22 DIAGNOSIS — E785 Hyperlipidemia, unspecified: Secondary | ICD-10-CM

## 2014-10-22 DIAGNOSIS — I779 Disorder of arteries and arterioles, unspecified: Secondary | ICD-10-CM

## 2014-10-22 DIAGNOSIS — I739 Peripheral vascular disease, unspecified: Secondary | ICD-10-CM

## 2014-10-22 DIAGNOSIS — M542 Cervicalgia: Secondary | ICD-10-CM | POA: Insufficient documentation

## 2014-10-22 DIAGNOSIS — F17200 Nicotine dependence, unspecified, uncomplicated: Secondary | ICD-10-CM

## 2014-10-22 DIAGNOSIS — I10 Essential (primary) hypertension: Secondary | ICD-10-CM

## 2014-10-22 NOTE — Telephone Encounter (Signed)
CMet an lipids

## 2014-10-22 NOTE — Telephone Encounter (Signed)
Pt calling- has appt to see Estella Husk 10/26/14.  Pt wants to have labs drawn while she is here. Pt said lipid panel for sure. Will forward to Dr. Irish Lack for review on which labs to draw.

## 2014-10-22 NOTE — Progress Notes (Signed)
Established Carotid Patient   History of Present Illness  Cynthia Hansen is a 74 y.o. female patient of Dr. Oneida Alar who is status post left CEA in 2008 and returns today for follow up. She has atrial fibrillation which is rate controlled. She is on Xarelto for this.   The patient denies any history of TIA or stroke symptoms, specifically the patient denies a history of amaurosis fugax or monocular blindness, denies a history unilateral of facial drooping, denies a history of hemiplegia, and denies a history of receptive or expressive aphasia.  Patient states she has a deteriorating disc in her lumbar spine. She denies claudication symptoms with walking, has low back pain and weakness in legs with climbing stairs, denies non healing wounds.  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 States she will work on stopping smoking and start to attend a fitness center 3 days/week; states she lost 12 pounds since she retired. She feels like the statin is making her muscles weak, denies myalgias; she will discuss with her cardiologist, Dr. Irish Lack, next week. She had a D&C February 2016. Dr. Leighton Ruff is her PCP.  She reports right foot drop started about 4 weeks ago, is not getting worse. Pt Diabetic: Yes, 120's glucose fasting Pt smoker: smoker (1/2 ppd, started smoking at age [age] yrs)  Pt meds include: Statin : Yes, she ran out of Crestor 2-3 weeks ago as she was taking it daily instead of 3 days/wek as per week. She is able to pull and push more weights at the gym ASA: No Other anticoagulants/antiplatelets: Jennye Moccasin for atrial fib    Past Medical History  Diagnosis Date  . Diabetes mellitus   . Hyperlipidemia   . A-fib   . Vitamin D deficiency   . Carotid artery occlusion   . Irregular heart beat     Social History History  Substance Use Topics  . Smoking status: Current Every Day Smoker    Types: Cigarettes  . Smokeless tobacco: Never Used  . Alcohol Use:  Yes    Family History Family History  Problem Relation Age of Onset  . Cancer Mother     Uterin  . Heart attack Father   . Hypertension Sister     Surgical History Past Surgical History  Procedure Laterality Date  . Abdominal hernia repair    . Carotid endarterectomy Left Dec. 3, 2008     cea  . Hysteroscopy w/d&c N/A 07/30/2014    Procedure: DILATATION AND CURETTAGE /HYSTEROSCOPY;  Surgeon: Osborne Oman, MD;  Location: [facility];  Service: Gynecology;  Laterality: N/A;  . Iud removal N/A 07/30/2014    Procedure: INTRAUTERINE DEVICE (IUD) REMOVAL;  Surgeon: Osborne Oman, MD;  Location: [facility];  Service: Gynecology;  Laterality: N/A;    Allergies  Allergen Reactions  . Avandia [Rosiglitazone] Other (See Comments)    Causes muscle weakness.    Current Outpatient Prescriptions  Medication Sig Dispense Refill  . acetaminophen (TYLENOL) 500 MG tablet Take 1,000 mg by mouth every 6 (six) hours as needed for mild pain (For back pain.).    Marland Kitchen Cholecalciferol (VITAMIN D) 1000 UNITS capsule Take 1 capsule (1,000 Units total) by mouth daily. (Patient not taking: Reported on 10/22/2014)    . diltiazem (CARDIZEM CD) 180 MG 24 hr capsule TAKE 1 CAPSULE BY MOUTH TWICE A DAY BEFORE MEALS 60 capsule 0  . diphenhydrAMINE (SOMINEX) 25 MG tablet Take 25-50 mg by mouth at bedtime as needed for sleep.    Marland Kitchen  docusate sodium (COLACE) 100 MG capsule Take 1 capsule (100 mg total) by mouth 2 (two) times daily as needed. 30 capsule 2  . furosemide (LASIX) 40 MG tablet Take 40 mg by mouth daily.      Marland Kitchen glimepiride (AMARYL) 1 MG tablet Take 1 mg by mouth daily with breakfast.   1  . HYDROcodone-acetaminophen (NORCO/VICODIN) 5-325 MG per tablet Take 1 tablet by mouth every 6 (six) hours as needed. (Patient not taking: Reported on 09/02/2014) 30 tablet 0  . KLOR-CON M20 20 MEQ tablet TAKE 1 TABLET BY MOUTH 3 DAYS A WEEK 50 tablet 2  . lisinopril (PRINIVIL,ZESTRIL) 5 MG tablet Take 5 mg by mouth daily.     .  metFORMIN (GLUCOPHAGE) 1000 MG tablet Take 1,000 mg by mouth 2 (two) times daily with a meal.    . ONE TOUCH ULTRA TEST test strip   5  . potassium chloride SA (K-DUR,KLOR-CON) 20 MEQ tablet Take 20 mEq by mouth 3 (three) times a week. Patient takes on Monday, Wednesday and Friday.    . rosuvastatin (CRESTOR) 5 MG tablet Take 5 mg three days a week (Patient not taking: Reported on 10/22/2014) 15 tablet 11  . Sodium Bicarbonate-Citric Acid (ALKA-SELTZER HEARTBURN PO) Take 1 each by mouth daily as needed (For heartburn.).    Marland Kitchen XARELTO 20 MG TABS tablet TAKE 1 TABLET (20 MG TOTAL) BY MOUTH DAILY WITH SUPPER. 30 tablet 5   No current facility-administered medications for this visit.    Review of Systems : See HPI for pertinent positives and negatives.  Physical Examination  Filed Vitals:   10/22/14 0958 10/22/14 1001  BP: 112/68 112/59  Pulse: 75 75  Resp:  16  Height:  5\' 9"  (1.753 m)  Weight:  186 lb (84.369 kg)  SpO2:  98%   Body mass index is 27.45 kg/(m^2).  General: WDWN female in NAD GAIT: normal Eyes: PERRLA Pulmonary: Non-labored, CTAB, Negative Rales, Negative rhonchi, & Negative wheezing.  Cardiac: irregular Rhythm, no detected murmur.  VASCULAR EXAM Carotid Bruits Right Left   Negative Negative   Aorta is not palpable. Radial pulses are 2+ palpable and equal.      LE Pulses Right Left        FEMORAL 2+ palpable 2+ palpable   POPLITEAL not palpable  not palpable   POSTERIOR TIBIAL not palpable  not palpable    DORSALIS PEDIS  ANTERIOR TIBIAL 1+ palpable  1+ palpable     Gastrointestinal: soft, nontender, BS WNL, no r/g, no palpable masses. Musculoskeletal: Generalized mild muscle atrophy/wasting. M/S 5/5 throughout, extremities without ischemic changes.  Neurologic: A&O X 3; Appropriate  Affect ; SENSATION ;normal;  Speech is normal CN 2-12 intact except mild hearing loss, is wearing hearing aids, Pain and light touch intact in extremities, Motor exam as listed above.         Non-Invasive Vascular Imaging CAROTID DUPLEX 10/22/2014   CEREBROVASCULAR DUPLEX EVALUATION    INDICATION: Carotid stenosis    PREVIOUS INTERVENTION(S): Left carotid endarterectomy 05/08/2007.    DUPLEX EXAM:     RIGHT  LEFT  Peak Systolic Velocities (cm/s) End Diastolic Velocities (cm/s) Plaque LOCATION Peak Systolic Velocities (cm/s) End Diastolic Velocities (cm/s) Plaque  80 11  CCA PROXIMAL 71 25   69 11  CCA MID 94 35   120 23 HT CCA DISTAL 87 20 HM  241 17 HT ECA 130 12   247 67 HT ICA PROXIMAL 80 29 HM  187 60  ICA  MID 105 34   116 30  ICA DISTAL 97 31     3.60 ICA / CCA Ratio (PSV) carotid endarterectomy  Antegrade Vertebral Flow Antegrade  [id number] Brachial Systolic Pressure (mmHg) 0000000  Triphasic Brachial Artery Waveforms Triphasic    Plaque Morphology:  HM = Homogeneous, HT = Heterogeneous, CP = Calcific Plaque, SP = Smooth Plaque, IP = Irregular Plaque  ADDITIONAL FINDINGS: Right subclavian PSV 156cm/sec and left subclavian PSV 159cm/sec.    IMPRESSION: Right internal carotid artery stenosis present in the 50%-69% range. Left internal carotid artery is patent with history of carotid endarterectomy, mild hyperplasia present at the proximal patch without hemodynamically significant stenosis present.    Compared to the previous exam:  Essentially unchanged since previous study on 04/10/2014.      Assessment: BETSUA SILLIMAN is a 74 y.o. female who is status post left CEA in 2008. She has atrial fibrillation which is rate controlled. She is on Xarelto for this.  The patient denies any history of TIA or stroke symptoms. Today's carotid Duplex suggests right internal carotid artery stenosis in the 50%-69% range. Left internal carotid artery is patent with history of carotid  endarterectomy, mild hyperplasia present at the proximal patch without hemodynamically significant stenosis present. Essentially unchanged since previous study on 04/10/2014.  She reports right foot drop started about 4 weeks ago, is not getting worse; I advised her to speak with her PCP ASAP re this.  Her atherosclerotic risk factors include DM and continued smoking.   Plan:  The patient was counseled re smoking cessation and given several free resources re smoking cessation.  Follow-up in 6 months with Carotid Duplex.   I discussed in depth with the patient the nature of atherosclerosis, and emphasized the importance of maximal medical management including strict control of blood pressure, blood glucose, and lipid levels, obtaining regular exercise, and cessation of smoking.  The patient is aware that without maximal medical management the underlying atherosclerotic disease process will progress, limiting the benefit of any interventions. The patient was given information about stroke prevention and what symptoms should prompt the patient to seek immediate medical care. Thank you for allowing [place] to participate in this patient's care.  Clemon Chambers, RN, MSN, FNP-C [facility] Office: [facility] Physician: Oneida Alar  10/22/2014 10:16 AM

## 2014-10-22 NOTE — Patient Instructions (Signed)
Stroke Prevention Some medical conditions and behaviors are associated with an increased chance of having a stroke. You may prevent a stroke by making healthy choices and managing medical conditions. HOW CAN I REDUCE MY RISK OF HAVING A STROKE?   Stay physically active. Get at least 30 minutes of activity on most or all days.  Do not smoke. It may also be helpful to avoid exposure to secondhand smoke.  Limit alcohol use. Moderate alcohol use is considered to be:  No more than 2 drinks per day for men.  No more than 1 drink per day for nonpregnant women.  Eat healthy foods. This involves:  Eating 5 or more servings of fruits and vegetables a day.  Making dietary changes that address high blood pressure (hypertension), high cholesterol, diabetes, or obesity.  Manage your cholesterol levels.  Making food choices that are high in fiber and low in saturated fat, trans fat, and cholesterol may control cholesterol levels.  Take any prescribed medicines to control cholesterol as directed by your health care provider.  Manage your diabetes.  Controlling your carbohydrate and sugar intake is recommended to manage diabetes.  Take any prescribed medicines to control diabetes as directed by your health care provider.  Control your hypertension.  Making food choices that are low in salt (sodium), saturated fat, trans fat, and cholesterol is recommended to manage hypertension.  Take any prescribed medicines to control hypertension as directed by your health care provider.  Maintain a healthy weight.  Reducing calorie intake and making food choices that are low in sodium, saturated fat, trans fat, and cholesterol are recommended to manage weight.  Stop drug abuse.  Avoid taking birth control pills.  Talk to your health care provider about the risks of taking birth control pills if you are over 35 years old, smoke, get migraines, or have ever had a blood clot.  Get evaluated for sleep  disorders (sleep apnea).  Talk to your health care provider about getting a sleep evaluation if you snore a lot or have excessive sleepiness.  Take medicines only as directed by your health care provider.  For some people, aspirin or blood thinners (anticoagulants) are helpful in reducing the risk of forming abnormal blood clots that can lead to stroke. If you have the irregular heart rhythm of atrial fibrillation, you should be on a blood thinner unless there is a good reason you cannot take them.  Understand all your medicine instructions.  Make sure that other conditions (such as anemia or atherosclerosis) are addressed. SEEK IMMEDIATE MEDICAL CARE IF:   You have sudden weakness or numbness of the face, arm, or leg, especially on one side of the body.  Your face or eyelid droops to one side.  You have sudden confusion.  You have trouble speaking (aphasia) or understanding.  You have sudden trouble seeing in one or both eyes.  You have sudden trouble walking.  You have dizziness.  You have a loss of balance or coordination.  You have a sudden, severe headache with no known cause.  You have new chest pain or an irregular heartbeat. Any of these symptoms may represent a serious problem that is an emergency. Do not wait to see if the symptoms will go away. Get medical help at once. Call your local emergency services (911 in [place] Do not drive yourself to the hospital. Document Released: 06/29/2004 Document Revised: 10/06/2013 Document Reviewed: 11/22/2012 [employer] Patient Information 2015 [employer]. This information is not intended to replace advice given   to you by your health care provider. Make sure you discuss any questions you have with your health care provider.    Smoking Cessation Quitting smoking is important to your health and has many advantages. However, it is not always easy to quit since nicotine is a very addictive drug. Oftentimes, people try 3 times or  more before being able to quit. This document explains the best ways for you to prepare to quit smoking. Quitting takes hard work and a lot of effort, but you can do it. ADVANTAGES OF QUITTING SMOKING  You will live longer, feel better, and live better.  Your body will feel the impact of quitting smoking almost immediately.  Within 20 minutes, blood pressure decreases. Your pulse returns to its normal level.  After 8 hours, carbon monoxide levels in the blood return to normal. Your oxygen level increases.  After 24 hours, the chance of having a heart attack starts to decrease. Your breath, hair, and body stop smelling like smoke.  After 48 hours, damaged nerve endings begin to recover. Your sense of taste and smell improve.  After 72 hours, the body is virtually free of nicotine. Your bronchial tubes relax and breathing becomes easier.  After 2 to 12 weeks, lungs can hold more air. Exercise becomes easier and circulation improves.  The risk of having a heart attack, stroke, cancer, or lung disease is greatly reduced.  After 1 year, the risk of coronary heart disease is cut in half.  After 5 years, the risk of stroke falls to the same as a nonsmoker.  After 10 years, the risk of lung cancer is cut in half and the risk of other cancers decreases significantly.  After 15 years, the risk of coronary heart disease drops, usually to the level of a nonsmoker.  If you are pregnant, quitting smoking will improve your chances of having a healthy baby.  The people you live with, especially any children, will be healthier.  You will have extra money to spend on things other than cigarettes. QUESTIONS TO THINK ABOUT BEFORE ATTEMPTING TO QUIT You may want to talk about your answers with your health care provider.  Why do you want to quit?  If you tried to quit in the past, what helped and what did not?  What will be the most difficult situations for you after you quit? How will you plan to  handle them?  Who can help you through the tough times? Your family? Friends? A health care provider?  What pleasures do you get from smoking? What ways can you still get pleasure if you quit? Here are some questions to ask your health care provider:  How can you help me to be successful at quitting?  What medicine do you think would be best for me and how should I take it?  What should I do if I need more help?  What is smoking withdrawal like? How can I get information on withdrawal? GET READY  Set a quit date.  Change your environment by getting rid of all cigarettes, ashtrays, matches, and lighters in your home, car, or work. Do not let people smoke in your home.  Review your past attempts to quit. Think about what worked and what did not. GET SUPPORT AND ENCOURAGEMENT You have a better chance of being successful if you have help. You can get support in many ways.  Tell your family, friends, and coworkers that you are going to quit and need their support. Ask them   not to smoke around you.  Get individual, group, or telephone counseling and support. Programs are available at local hospitals and health centers. Call your local health department for information about programs in your area.  Spiritual beliefs and practices may help some smokers quit.  Download a "quit meter" on your computer to keep track of quit statistics, such as how long you have gone without smoking, cigarettes not smoked, and money saved.  Get a self-help book about quitting smoking and staying off tobacco. LEARN NEW SKILLS AND BEHAVIORS  Distract yourself from urges to smoke. Talk to someone, go for a walk, or occupy your time with a task.  Change your normal routine. Take a different route to work. Drink tea instead of coffee. Eat breakfast in a different place.  Reduce your stress. Take a hot bath, exercise, or read a book.  Plan something enjoyable to do every day. Reward yourself for not  smoking.  Explore interactive web-based programs that specialize in helping you quit. GET MEDICINE AND USE IT CORRECTLY Medicines can help you stop smoking and decrease the urge to smoke. Combining medicine with the above behavioral methods and support can greatly increase your chances of successfully quitting smoking.  Nicotine replacement therapy helps deliver nicotine to your body without the negative effects and risks of smoking. Nicotine replacement therapy includes nicotine gum, lozenges, inhalers, nasal sprays, and skin patches. Some may be available over-the-counter and others require a prescription.  Antidepressant medicine helps people abstain from smoking, but how this works is unknown. This medicine is available by prescription.  Nicotinic receptor partial agonist medicine simulates the effect of nicotine in your brain. This medicine is available by prescription. Ask your health care provider for advice about which medicines to use and how to use them based on your health history. Your health care provider will tell you what side effects to look out for if you choose to be on a medicine or therapy. Carefully read the information on the package. Do not use any other product containing nicotine while using a nicotine replacement product.  RELAPSE OR DIFFICULT SITUATIONS Most relapses occur within the first 3 months after quitting. Do not be discouraged if you start smoking again. Remember, most people try several times before finally quitting. You may have symptoms of withdrawal because your body is used to nicotine. You may crave cigarettes, be irritable, feel very hungry, cough often, get headaches, or have difficulty concentrating. The withdrawal symptoms are only temporary. They are strongest when you first quit, but they will go away within 10-14 days. To reduce the chances of relapse, try to:  Avoid drinking alcohol. Drinking lowers your chances of successfully quitting.  Reduce the  amount of caffeine you consume. Once you quit smoking, the amount of caffeine in your body increases and can give you symptoms, such as a rapid heartbeat, sweating, and anxiety.  Avoid smokers because they can make you want to smoke.  Do not let weight gain distract you. Many smokers will gain weight when they quit, usually less than 10 pounds. Eat a healthy diet and stay active. You can always lose the weight gained after you quit.  Find ways to improve your mood other than smoking. FOR MORE INFORMATION  www.smokefree.gov  Document Released: 05/16/2001 Document Revised: 10/06/2013 Document Reviewed: 08/31/2011 [employer] Patient Information 2015 [employer]. This information is not intended to replace advice given to you by your health care provider. Make sure you discuss any questions you have with your health   care provider.    Smoking Cessation, Tips for Success If you are ready to quit smoking, congratulations! You have chosen to help yourself be healthier. Cigarettes bring nicotine, tar, carbon monoxide, and other irritants into your body. Your lungs, heart, and blood vessels will be able to work better without these poisons. There are many different ways to quit smoking. Nicotine gum, nicotine patches, a nicotine inhaler, or nicotine nasal spray can help with physical craving. Hypnosis, support groups, and medicines help break the habit of smoking. WHAT THINGS CAN I DO TO MAKE QUITTING EASIER?  Here are some tips to help you quit for good:  Pick a date when you will quit smoking completely. Tell all of your friends and family about your plan to quit on that date.  Do not try to slowly cut down on the number of cigarettes you are smoking. Pick a quit date and quit smoking completely starting on that day.  Throw away all cigarettes.   Clean and remove all ashtrays from your home, work, and car.  On a card, write down your reasons for quitting. Carry the card with you and read it  when you get the urge to smoke.  Cleanse your body of nicotine. Drink enough water and fluids to keep your urine clear or pale yellow. Do this after quitting to flush the nicotine from your body.  Learn to predict your moods. Do not let a bad situation be your excuse to have a cigarette. Some situations in your life might tempt you into wanting a cigarette.  Never have "just one" cigarette. It leads to wanting another and another. Remind yourself of your decision to quit.  Change habits associated with smoking. If you smoked while driving or when feeling stressed, try other activities to replace smoking. Stand up when drinking your coffee. Brush your teeth after eating. Sit in a different chair when you read the paper. Avoid alcohol while trying to quit, and try to drink fewer caffeinated beverages. Alcohol and caffeine may urge you to smoke.  Avoid foods and drinks that can trigger a desire to smoke, such as sugary or spicy foods and alcohol.  Ask people who smoke not to smoke around you.  Have something planned to do right after eating or having a cup of coffee. For example, plan to take a walk or exercise.  Try a relaxation exercise to calm you down and decrease your stress. Remember, you may be tense and nervous for the first 2 weeks after you quit, but this will pass.  Find new activities to keep your hands busy. Play with a pen, coin, or rubber band. Doodle or draw things on paper.  Brush your teeth right after eating. This will help cut down on the craving for the taste of tobacco after meals. You can also try mouthwash.   Use oral substitutes in place of cigarettes. Try using lemon drops, carrots, cinnamon sticks, or chewing gum. Keep them handy so they are available when you have the urge to smoke.  When you have the urge to smoke, try deep breathing.  Designate your home as a nonsmoking area.  If you are a heavy smoker, ask your health care provider about a prescription for  nicotine chewing gum. It can ease your withdrawal from nicotine.  Reward yourself. Set aside the cigarette money you save and buy yourself something nice.  Look for support from others. Join a support group or smoking cessation program. Ask someone at home or at work   to help you with your plan to quit smoking.  Always ask yourself, "Do I need this cigarette or is this just a reflex?" Tell yourself, "Today, I choose not to smoke," or "I do not want to smoke." You are reminding yourself of your decision to quit.  Do not replace cigarette smoking with electronic cigarettes (commonly called e-cigarettes). The safety of e-cigarettes is unknown, and some may contain harmful chemicals.  If you relapse, do not give up! Plan ahead and think about what you will do the next time you get the urge to smoke. HOW WILL I FEEL WHEN I QUIT SMOKING? You may have symptoms of withdrawal because your body is used to nicotine (the addictive substance in cigarettes). You may crave cigarettes, be irritable, feel very hungry, cough often, get headaches, or have difficulty concentrating. The withdrawal symptoms are only temporary. They are strongest when you first quit but will go away within 10-14 days. When withdrawal symptoms occur, stay in control. Think about your reasons for quitting. Remind yourself that these are signs that your body is healing and getting used to being without cigarettes. Remember that withdrawal symptoms are easier to treat than the major diseases that smoking can cause.  Even after the withdrawal is over, expect periodic urges to smoke. However, these cravings are generally short lived and will go away whether you smoke or not. Do not smoke! WHAT RESOURCES ARE AVAILABLE TO HELP ME QUIT SMOKING? Your health care provider can direct you to community resources or hospitals for support, which may include:  Group support.  Education.  Hypnosis.  Therapy. Document Released: 02/18/2004 Document  Revised: 10/06/2013 Document Reviewed: 11/07/2012 [employer] Patient Information 2015 [employer]. This information is not intended to replace advice given to you by your health care provider. Make sure you discuss any questions you have with your health care provider.  

## 2014-10-22 NOTE — Addendum Note (Signed)
Addended by: Dorthula Rue L on: 10/22/2014 11:37 AM   Modules accepted: Orders

## 2014-10-23 ENCOUNTER — Other Ambulatory Visit (INDEPENDENT_AMBULATORY_CARE_PROVIDER_SITE_OTHER): Payer: PPO | Admitting: *Deleted

## 2014-10-23 DIAGNOSIS — E785 Hyperlipidemia, unspecified: Secondary | ICD-10-CM | POA: Diagnosis not present

## 2014-10-23 DIAGNOSIS — Z7901 Long term (current) use of anticoagulants: Secondary | ICD-10-CM | POA: Diagnosis not present

## 2014-10-23 LAB — CBC
HCT: 40.1 % (ref 36.0–46.0)
HEMOGLOBIN: 13.5 g/dL (ref 12.0–15.0)
MCHC: 33.7 g/dL (ref 30.0–36.0)
MCV: 91.4 fl (ref 78.0–100.0)
Platelets: 374 10*3/uL (ref 150.0–400.0)
RBC: 4.38 Mil/uL (ref 3.87–5.11)
RDW: 14.3 % (ref 11.5–15.5)
WBC: 9.2 10*3/uL (ref 4.0–10.5)

## 2014-10-23 LAB — BASIC METABOLIC PANEL
BUN: 14 mg/dL (ref 6–23)
CALCIUM: 9.7 mg/dL (ref 8.4–10.5)
CHLORIDE: 103 meq/L (ref 96–112)
CO2: 27 meq/L (ref 19–32)
Creatinine, Ser: 1.13 mg/dL (ref 0.40–1.20)
GFR: 50.2 mL/min — ABNORMAL LOW (ref >60.00)
Glucose, Bld: 130 mg/dL — ABNORMAL HIGH (ref 70–99)
POTASSIUM: 4.6 meq/L (ref 3.5–5.1)
SODIUM: 137 meq/L (ref 135–145)

## 2014-10-23 LAB — LIPID PANEL
Cholesterol: 191 mg/dL (ref 0–200)
HDL: 40.5 mg/dL (ref >39.00)
LDL Cholesterol: 117 mg/dL — ABNORMAL HIGH (ref 0–99)
NonHDL: 150.5
TRIGLYCERIDES: 168 mg/dL — AB (ref 0.0–149.0)
Total CHOL/HDL Ratio: 5
VLDL: 33.6 mg/dL (ref 0.0–40.0)

## 2014-10-23 NOTE — Addendum Note (Signed)
Addended by: Eulis Foster on: 10/23/2014 08:26 AM   Modules accepted: Orders

## 2014-10-23 NOTE — Addendum Note (Signed)
Addended by: Eulis Foster on: 10/23/2014 08:25 AM   Modules accepted: Orders

## 2014-10-26 ENCOUNTER — Ambulatory Visit (INDEPENDENT_AMBULATORY_CARE_PROVIDER_SITE_OTHER): Payer: PPO | Admitting: Physician Assistant

## 2014-10-26 ENCOUNTER — Encounter: Payer: Self-pay | Admitting: Physician Assistant

## 2014-10-26 ENCOUNTER — Other Ambulatory Visit: Payer: PPO

## 2014-10-26 VITALS — BP 110/50 | HR 68 | Ht 69.0 in | Wt 184.0 lb

## 2014-10-26 DIAGNOSIS — E782 Mixed hyperlipidemia: Secondary | ICD-10-CM

## 2014-10-26 DIAGNOSIS — F17200 Nicotine dependence, unspecified, uncomplicated: Secondary | ICD-10-CM

## 2014-10-26 DIAGNOSIS — I4891 Unspecified atrial fibrillation: Secondary | ICD-10-CM | POA: Diagnosis not present

## 2014-10-26 DIAGNOSIS — E1159 Type 2 diabetes mellitus with other circulatory complications: Secondary | ICD-10-CM

## 2014-10-26 DIAGNOSIS — I6523 Occlusion and stenosis of bilateral carotid arteries: Secondary | ICD-10-CM

## 2014-10-26 DIAGNOSIS — Z720 Tobacco use: Secondary | ICD-10-CM

## 2014-10-26 MED ORDER — ROSUVASTATIN CALCIUM 5 MG PO TABS: ORAL_TABLET | ORAL | Status: DC

## 2014-10-26 NOTE — Assessment & Plan Note (Addendum)
Lipids reviewed, Refill Crestor

## 2014-10-26 NOTE — Assessment & Plan Note (Signed)
Patient on metformin.

## 2014-10-26 NOTE — Assessment & Plan Note (Signed)
Patient continues to smoke a half a pack of cigarettes daily despite quitting for 3 years. I had along discussion with the patient concerning this and recommend smoking cessation. Her husband also smokes and she says she's not ready to quit.

## 2014-10-26 NOTE — Patient Instructions (Addendum)
Medication Instructions:   CRESTOR REFILL SENT TO  PHARMACY  [facility]  Labwork:   Testing/Procedures:   Follow-Up:  Your physician wants you to follow-up in [place] will receive a reminder letter in the mail two months in advance. If you don't receive a letter, please call our office to schedule the follow-up appointment.   Any Other Special Instructions Will Be Listed Below (If Applicable).Smoking Cessation Quitting smoking is important to your health and has many advantages. However, it is not always easy to quit since nicotine is a very addictive drug. Oftentimes, people try 3 times or more before being able to quit. This document explains the best ways for you to prepare to quit smoking. Quitting takes hard work and a lot of effort, but you can do it. ADVANTAGES OF QUITTING SMOKING  You will live longer, feel better, and live better.  Your body will feel the impact of quitting smoking almost immediately.  Within 20 minutes, blood pressure decreases. Your pulse returns to its normal level.  After 8 hours, carbon monoxide levels in the blood return to normal. Your oxygen level increases.  After 24 hours, the chance of having a heart attack starts to decrease. Your breath, hair, and body stop smelling like smoke.  After 48 hours, damaged nerve endings begin to recover. Your sense of taste and smell improve.  After 72 hours, the body is virtually free of nicotine. Your bronchial tubes relax and breathing becomes easier.  After 2 to 12 weeks, lungs can hold more air. Exercise becomes easier and circulation improves.  The risk of having a heart attack, stroke, cancer, or lung disease is greatly reduced.  After 1 year, the risk of coronary heart disease is cut in half.  After 5 years, the risk of stroke falls to the same as a nonsmoker.  After 10 years, the risk of lung cancer is cut in half and the risk of other cancers decreases  significantly.  After 15 years, the risk of coronary heart disease drops, usually to the level of a nonsmoker.  If you are pregnant, quitting smoking will improve your chances of having a healthy baby.  The people you live with, especially any children, will be healthier.  You will have extra money to spend on things other than cigarettes. QUESTIONS TO THINK ABOUT BEFORE ATTEMPTING TO QUIT You may want to talk about your answers with your health care provider.  Why do you want to quit?  If you tried to quit in the past, what helped and what did not?  What will be the most difficult situations for you after you quit? How will you plan to handle them?  Who can help you through the tough times? Your family? Friends? A health care provider?  What pleasures do you get from smoking? What ways can you still get pleasure if you quit? Here are some questions to ask your health care provider:  How can you help me to be successful at quitting?  What medicine do you think would be best for me and how should I take it?  What should I do if I need more help?  What is smoking withdrawal like? How can I get information on withdrawal? GET READY  Set a quit date.  Change your environment by getting rid of all cigarettes, ashtrays, matches, and lighters in your home, car, or work. Do not let people smoke in your home.  Review your past attempts to quit.  Think about what worked and what did not. GET SUPPORT AND ENCOURAGEMENT You have a better chance of being successful if you have help. You can get support in many ways.  Tell your family, friends, and coworkers that you are going to quit and need their support. Ask them not to smoke around you.  Get individual, group, or telephone counseling and support. Programs are available at [employer] and health centers. Call your local health department for information about programs in your area.  Spiritual beliefs and practices may help some  smokers quit.  Download a "quit meter" on your computer to keep track of quit statistics, such as how long you have gone without smoking, cigarettes not smoked, and money saved.  Get a self-help book about quitting smoking and staying off tobacco. Sacaton yourself from urges to smoke. Talk to someone, go for a walk, or occupy your time with a task.  Change your normal routine. Take a different route to work. Drink tea instead of coffee. Eat breakfast in a different place.  Reduce your stress. Take a hot bath, exercise, or read a book.  Plan something enjoyable to do every day. Reward yourself for not smoking.  Explore interactive web-based programs that specialize in helping you quit. GET MEDICINE AND USE IT CORRECTLY Medicines can help you stop smoking and decrease the urge to smoke. Combining medicine with the above behavioral methods and support can greatly increase your chances of successfully quitting smoking.  Nicotine replacement therapy helps deliver nicotine to your body without the negative effects and risks of smoking. Nicotine replacement therapy includes nicotine gum, lozenges, inhalers, nasal sprays, and skin patches. Some may be available over-the-counter and others require a prescription.  Antidepressant medicine helps people abstain from smoking, but how this works is unknown. This medicine is available by prescription.  Nicotinic receptor partial agonist medicine simulates the effect of nicotine in your brain. This medicine is available by prescription. Ask your health care provider for advice about which medicines to use and how to use them based on your health history. Your health care provider will tell you what side effects to look out for if you choose to be on a medicine or therapy. Carefully read the information on the package. Do not use any other product containing nicotine while using a nicotine replacement product.  RELAPSE OR  DIFFICULT SITUATIONS Most relapses occur within the first 3 months after quitting. Do not be discouraged if you start smoking again. Remember, most people try several times before finally quitting. You may have symptoms of withdrawal because your body is used to nicotine. You may crave cigarettes, be irritable, feel very hungry, cough often, get headaches, or have difficulty concentrating. The withdrawal symptoms are only temporary. They are strongest when you first quit, but they will go away within 10-14 days. To reduce the chances of relapse, try to:  Avoid drinking alcohol. Drinking lowers your chances of successfully quitting.  Reduce the amount of caffeine you consume. Once you quit smoking, the amount of caffeine in your body increases and can give you symptoms, such as a rapid heartbeat, sweating, and anxiety.  Avoid smokers because they can make you want to smoke.  Do not let weight gain distract you. Many smokers will gain weight when they quit, usually less than 10 pounds. Eat a healthy diet and stay active. You can always lose the weight gained after you quit.  Find ways to improve your mood other  than smoking. FOR MORE INFORMATION  www.smokefree.gov  Document Released: 05/16/2001 Document Revised: 10/06/2013 Document Reviewed: 08/31/2011 [facility] Patient Information 2015 [place], [place]. This information is not intended to replace advice given to you by your health care provider. Make sure you discuss any questions you have with your health care provider.       Exercise to Lose Weight Exercise and a healthy diet may help you lose weight. Your doctor may suggest specific exercises. EXERCISE IDEAS AND TIPS  Choose low-cost things you enjoy doing, such as walking, bicycling, or exercising to workout videos.  Take stairs instead of the elevator.  Walk during your lunch break.  Park your car further away from work or school.  Go to a gym or an exercise class.  Start with  5 to 10 minutes of exercise each day. Build up to 30 minutes of exercise 4 to 6 days a week.  Wear shoes with good support and comfortable clothes.  Stretch before and after working out.  Work out until you breathe harder and your heart beats faster.  Drink extra water when you exercise.  Do not do so much that you hurt yourself, feel dizzy, or get very short of breath. Exercises that burn about 150 calories:  Running 1  miles in 15 minutes.  Playing volleyball for 45 to 60 minutes.  Washing and waxing a car for 45 to 60 minutes.  Playing touch football for 45 minutes.  Walking 1  miles in 35 minutes.  Pushing a stroller 1  miles in 30 minutes.  Playing basketball for 30 minutes.  Raking leaves for 30 minutes.  Bicycling 5 miles in 30 minutes.  Walking 2 miles in 30 minutes.  Dancing for 30 minutes.  Shoveling snow for 15 minutes.  Swimming laps for 20 minutes.  Walking up stairs for 15 minutes.  Bicycling 4 miles in 15 minutes.  Gardening for 30 to 45 minutes.  Jumping rope for 15 minutes.  Washing windows or floors for 45 to 60 minutes. Document Released: 06/24/2010 Document Revised: 08/14/2011 Document Reviewed: 06/24/2010 [facility] Patient Information 2015 [place], [place]. This information is not intended to replace advice given to you by your health care provider. Make sure you discuss any questions you have with your health care provider.

## 2014-10-26 NOTE — Assessment & Plan Note (Signed)
Rate controlled on diltiazem, and Xarelto.

## 2014-10-26 NOTE — Progress Notes (Signed)
Cardiology Office Note   Date:  10/26/2014   ID:  Cynthia, Hansen Nov 02, 1940, MRN [id number]  PCP:  Gerrit Heck, MD  Cardiologist:  Dr. Irish Lack  Chief Complaint:    History of Present Illness: Cynthia Hansen is a 74 y.o. female who presents for  yearly check up. She has a history of atrial fibrillation on Xarelto. She also has hypertension, hyperlipidemia, DM, smoker(1/2 ppd), and PVD with 50-69% RICA and patent Left carotid endarterectomy 10/22/14 followed by Dr. Oneida Alar.  Patient comes in today for her yearly checkup. She has lost 50 pounds over the past 2 and half years, 77 of which she has lost since the first of the year. She is exercising 3 days a week and watching her diet. She had blood work last week that all looks good except for elevated triglycerides of 168 and LDL 117. She ran out of her Crestor in April. She denies chest pain, palpitations, dyspnea, dyspnea on exertion,  or presyncope. Unfortunately she continues to smoke half a pack of cigarettes daily. She had quit for 3 years but resumed. She also complains of neuropathy in her lower legs.  Past Medical History  Diagnosis Date  . Diabetes mellitus   . Hyperlipidemia   . A-fib   . Vitamin D deficiency   . Carotid artery occlusion   . Irregular heart beat     Past Surgical History  Procedure Laterality Date  . Abdominal hernia repair    . Carotid endarterectomy Left Dec. 3, 2008     cea  . Hysteroscopy w/d&c N/A 07/30/2014    Procedure: DILATATION AND CURETTAGE /HYSTEROSCOPY;  Surgeon: Osborne Oman, MD;  Location: [facility];  Service: Gynecology;  Laterality: N/A;  . Iud removal N/A 07/30/2014    Procedure: INTRAUTERINE DEVICE (IUD) REMOVAL;  Surgeon: Osborne Oman, MD;  Location: [facility];  Service: Gynecology;  Laterality: N/A;     Current Outpatient Prescriptions  Medication Sig Dispense Refill  . acetaminophen (TYLENOL) 500 MG tablet Take 1,000 mg by mouth every 6 (six) hours as needed for  mild pain (For back pain.).    Marland Kitchen Cholecalciferol (VITAMIN D) 1000 UNITS capsule Take 1 capsule (1,000 Units total) by mouth daily.    Marland Kitchen diltiazem (CARDIZEM CD) 180 MG 24 hr capsule TAKE 1 CAPSULE BY MOUTH TWICE A DAY BEFORE MEALS 60 capsule 0  . furosemide (LASIX) 40 MG tablet Take 40 mg by mouth daily.      Marland Kitchen glimepiride (AMARYL) 1 MG tablet Take 1 mg by mouth daily with breakfast.   1  . HYDROcodone-acetaminophen (NORCO/VICODIN) 5-325 MG per tablet Take 1 tablet by mouth every 6 (six) hours as needed. 30 tablet 0  . KLOR-CON M20 20 MEQ tablet TAKE 1 TABLET BY MOUTH 3 DAYS A WEEK 50 tablet 2  . lisinopril (PRINIVIL,ZESTRIL) 5 MG tablet Take 5 mg by mouth daily.     . metFORMIN (GLUCOPHAGE) 1000 MG tablet Take 1,000 mg by mouth 2 (two) times daily with a meal.    . ONE TOUCH ULTRA TEST test strip   5  . potassium chloride SA (K-DUR,KLOR-CON) 20 MEQ tablet Take 20 mEq by mouth 3 (three) times a week. Patient takes on Monday, Wednesday and Friday.    . rosuvastatin (CRESTOR) 5 MG tablet Take 5 mg three days a week 15 tablet 11  . Sodium Bicarbonate-Citric Acid (ALKA-SELTZER HEARTBURN PO) Take 1 each by mouth daily as needed (For heartburn.).    Marland Kitchen XARELTO 20  MG TABS tablet TAKE 1 TABLET (20 MG TOTAL) BY MOUTH DAILY WITH SUPPER. 30 tablet 5   No current facility-administered medications for this visit.    Allergies:   Avandia    Social History:  The patient  reports that she has been smoking Cigarettes.  She has never used smokeless tobacco. She reports that she drinks alcohol. She reports that she does not use illicit drugs.   Family History:  The patient's    family history includes Cancer in her mother; Heart attack in her father; Hypertension in her sister.    ROS:  Please see the history of present illness.   Otherwise, review of systems are positive for chronic cough, chronic back pain and muscle aches, occasional dizziness, IBS.   All other systems are reviewed and negative.     PHYSICAL EXAM: VS:  BP 110/50 mmHg  Pulse 68  Ht 5\' 9"  (1.753 m)  Wt 184 lb (83.462 kg)  BMI 27.16 kg/m2 , BMI Body mass index is 27.16 kg/(m^2). GEN: Well nourished, well developed, in no acute distress Neck: Right carotid bruit, no JVD, HJR,  or masses Cardiac:  Irregular irregular; no murmurs,gallop, rubs, thrill or heave,  Respiratory:  clear to auscultation bilaterally, normal work of breathing GI: soft, nontender, nondistended, + BS MS: no deformity or atrophy Extremities: without cyanosis, clubbing, edema, good distal pulses bilaterally.  Skin: warm and dry, no rash Neuro:  Strength and sensation are intact    EKG:  EKG is ordered today. The ekg ordered today demonstrates atrial fibrillation 68 bpm poor R wave progression anteriorly, no acute change   Recent Labs: 07/13/2014: ALT <8 10/23/2014: BUN 14; Creatinine 1.13; Hemoglobin 13.5; Platelets 374.0; Potassium 4.6; Sodium 137    Lipid Panel    Component Value Date/Time   CHOL 191 10/23/2014 0822   TRIG 168.0* 10/23/2014 0822   HDL 40.50 10/23/2014 0822   CHOLHDL 5 10/23/2014 0822   VLDL 33.6 10/23/2014 0822   LDLCALC 117* 10/23/2014 0822      Wt Readings from Last 3 Encounters:  10/26/14 184 lb (83.462 kg)  10/22/14 186 lb (84.369 kg)  09/02/14 187 lb 8 oz (85.049 kg)      Other studies Reviewed: Additional studies/ records that were reviewed today include and review of the records demonstrates:  Carotid Dopplers 10/22/14 50-69% RICA, no stenosis in the left endarterectomy All labs reviewed  ASSESSMENT AND PLAN: A-fib Rate controlled on diltiazem, and Xarelto.   Carotid stenosis Carotids reviewed from last week with 50-69% RICA, no significant change   Tobacco use disorder Patient continues to smoke a half a pack of cigarettes daily despite quitting for 3 years. I had along discussion with the patient concerning this and recommend smoking cessation. Her husband also smokes and she says she's not  ready to quit.   Mixed hyperlipidemia Lipids reviewed, Refill Crestor   Diabetes mellitus Patient on metformin.         Sumner Boast, PA-C  10/26/2014 9:16 AM    [facility] [address], [place], [place]  [address] Phone: [phone]; Fax: [phone]

## 2014-10-26 NOTE — Assessment & Plan Note (Signed)
Carotids reviewed from last week with 50-69% RICA, no significant change

## 2014-10-26 NOTE — Telephone Encounter (Signed)
Orders placed for CMET and lipids

## 2014-10-27 ENCOUNTER — Telehealth: Payer: Self-pay

## 2014-10-27 NOTE — Telephone Encounter (Signed)
Received phone call from [facility] to do a prior auth over the phone for Crestor. All questions answered. Patient has tried Pravachol in the past.

## 2014-10-28 ENCOUNTER — Telehealth: Payer: Self-pay

## 2014-10-28 NOTE — Telephone Encounter (Signed)
Fax from [employer] approving generic Crestor 5mg  . Approved through 06/05/2015.

## 2014-10-29 ENCOUNTER — Other Ambulatory Visit: Payer: Self-pay | Admitting: Interventional Cardiology

## 2014-10-29 NOTE — Telephone Encounter (Signed)
Per note 5.23.16

## 2014-11-05 ENCOUNTER — Other Ambulatory Visit: Payer: Self-pay | Admitting: *Deleted

## 2014-11-05 DIAGNOSIS — E782 Mixed hyperlipidemia: Secondary | ICD-10-CM

## 2014-11-05 DIAGNOSIS — E785 Hyperlipidemia, unspecified: Secondary | ICD-10-CM

## 2014-11-05 MED ORDER — ATORVASTATIN CALCIUM 10 MG PO TABS: 10.0000 mg | ORAL_TABLET | Freq: Every day | ORAL | Status: DC

## 2015-01-14 ENCOUNTER — Other Ambulatory Visit: Payer: Self-pay | Admitting: Interventional Cardiology

## 2015-02-16 ENCOUNTER — Other Ambulatory Visit (INDEPENDENT_AMBULATORY_CARE_PROVIDER_SITE_OTHER): Payer: PPO | Admitting: *Deleted

## 2015-02-16 DIAGNOSIS — E782 Mixed hyperlipidemia: Secondary | ICD-10-CM

## 2015-02-16 DIAGNOSIS — E785 Hyperlipidemia, unspecified: Secondary | ICD-10-CM

## 2015-02-16 LAB — LIPID PANEL
Cholesterol: 117 mg/dL (ref 0–200)
HDL: 41.2 mg/dL (ref >39.00)
LDL CALC: 58 mg/dL (ref 0–99)
NonHDL: 76.15
TRIGLYCERIDES: 91 mg/dL (ref 0.0–149.0)
Total CHOL/HDL Ratio: 3
VLDL: 18.2 mg/dL (ref 0.0–40.0)

## 2015-02-16 LAB — HEPATIC FUNCTION PANEL
ALK PHOS: 73 U/L (ref 39–117)
ALT: 5 U/L (ref 0–35)
AST: 8 U/L (ref 0–37)
Albumin: 3.9 g/dL (ref 3.5–5.2)
BILIRUBIN TOTAL: 0.3 mg/dL (ref 0.2–1.2)
Bilirubin, Direct: 0.1 mg/dL (ref 0.0–0.3)
TOTAL PROTEIN: 7 g/dL (ref 6.0–8.3)

## 2015-02-16 NOTE — Addendum Note (Signed)
Addended by: Eulis Foster on: 02/16/2015 08:50 AM   Modules accepted: Orders

## 2015-02-18 ENCOUNTER — Telehealth: Payer: Self-pay | Admitting: Interventional Cardiology

## 2015-02-18 MED ORDER — WARFARIN SODIUM 5 MG PO TABS: 5.0000 mg | ORAL_TABLET | Freq: Every day | ORAL | Status: DC

## 2015-02-18 NOTE — Telephone Encounter (Signed)
New Message  Pt  Calling to speak w/ RN concerning alternatives to Xarelto- pt stated she can not afford current Rx. Pt stated can use mobile #, but if not avail please use home # as well. Please call back and discuss.

## 2015-02-18 NOTE — Telephone Encounter (Signed)
Spoke with patient and Xarelto has gone up to $160 a month and would like to change to Warfarin Spoke with Valetta Fuller T PA and ok to change to Warfarin  Elita Boone D recommends patient starting Warfarin 5 mg daily and taking the Xarelto 20 mg daily both for 3 days  Then just Warfarin 5 mg daily and come to [facility] clinic on Wednesday 9/21 Patient aware of changes  Patient would like for Dr Irish Lack to review medications and recent lab work  She would like to get off some of medications  Will forward to Dr Irish Lack for review

## 2015-02-22 NOTE — Telephone Encounter (Signed)
I do not see any meds that she can stop at this time, especially if her BP is currently controlled.  OK to switch to Coumadin.

## 2015-02-23 ENCOUNTER — Ambulatory Visit (INDEPENDENT_AMBULATORY_CARE_PROVIDER_SITE_OTHER): Payer: PPO | Admitting: Pharmacist

## 2015-02-23 DIAGNOSIS — I4891 Unspecified atrial fibrillation: Secondary | ICD-10-CM | POA: Diagnosis not present

## 2015-02-23 DIAGNOSIS — Z5181 Encounter for therapeutic drug level monitoring: Secondary | ICD-10-CM | POA: Diagnosis not present

## 2015-02-23 LAB — POCT INR: INR: 4

## 2015-02-23 NOTE — Telephone Encounter (Signed)
Spoke with pt and advised her of information provided by Dr. Irish Lack. Pt agreeable with plan but would prefer a call when closer to time to schedule lab appt. Will place call in recall system.

## 2015-02-23 NOTE — Telephone Encounter (Signed)
OK.  Can recheck labs in a 3 months off of atorvastatin,

## 2015-02-23 NOTE — Telephone Encounter (Signed)
Spoke with pt and informed her of information provided by Dr. Irish Lack. Pt states that she would like to stop her Atorvastatin because she feels that it is making her weak and states that her legs have no muscle mass right now. Pt states that a few months ago she totally changed her diet and goes to the gym 3x/week. Pt states that she would like to come off of statin medication and recheck it in a few months to see if she is able to keep her cholesterol down on her own with lifestyle changes. Advised pt that I would route this information to Dr. Irish Lack for review and advisement.

## 2015-03-01 ENCOUNTER — Ambulatory Visit (INDEPENDENT_AMBULATORY_CARE_PROVIDER_SITE_OTHER): Payer: PPO | Admitting: *Deleted

## 2015-03-01 DIAGNOSIS — Z5181 Encounter for therapeutic drug level monitoring: Secondary | ICD-10-CM

## 2015-03-01 DIAGNOSIS — I4891 Unspecified atrial fibrillation: Secondary | ICD-10-CM

## 2015-03-01 LAB — PROTIME-INR

## 2015-03-01 LAB — POCT INR: INR: 6.2

## 2015-03-05 ENCOUNTER — Ambulatory Visit (INDEPENDENT_AMBULATORY_CARE_PROVIDER_SITE_OTHER): Payer: PPO

## 2015-03-05 DIAGNOSIS — I4891 Unspecified atrial fibrillation: Secondary | ICD-10-CM

## 2015-03-05 DIAGNOSIS — Z5181 Encounter for therapeutic drug level monitoring: Secondary | ICD-10-CM | POA: Diagnosis not present

## 2015-03-05 LAB — POCT INR: INR: 2.1

## 2015-03-17 ENCOUNTER — Ambulatory Visit (INDEPENDENT_AMBULATORY_CARE_PROVIDER_SITE_OTHER): Payer: PPO | Admitting: *Deleted

## 2015-03-17 DIAGNOSIS — Z5181 Encounter for therapeutic drug level monitoring: Secondary | ICD-10-CM

## 2015-03-17 DIAGNOSIS — I4891 Unspecified atrial fibrillation: Secondary | ICD-10-CM | POA: Diagnosis not present

## 2015-03-17 LAB — POCT INR: INR: 2.9

## 2015-03-23 ENCOUNTER — Ambulatory Visit (INDEPENDENT_AMBULATORY_CARE_PROVIDER_SITE_OTHER): Payer: PPO | Admitting: *Deleted

## 2015-03-23 DIAGNOSIS — I4891 Unspecified atrial fibrillation: Secondary | ICD-10-CM

## 2015-03-23 DIAGNOSIS — Z5181 Encounter for therapeutic drug level monitoring: Secondary | ICD-10-CM

## 2015-03-23 LAB — POCT INR: INR: 2.9

## 2015-04-06 ENCOUNTER — Ambulatory Visit (INDEPENDENT_AMBULATORY_CARE_PROVIDER_SITE_OTHER): Payer: PPO | Admitting: *Deleted

## 2015-04-06 DIAGNOSIS — Z5181 Encounter for therapeutic drug level monitoring: Secondary | ICD-10-CM

## 2015-04-06 DIAGNOSIS — I4891 Unspecified atrial fibrillation: Secondary | ICD-10-CM

## 2015-04-06 LAB — POCT INR: INR: 2.7

## 2015-04-06 MED ORDER — WARFARIN SODIUM 5 MG PO TABS: 5.0000 mg | ORAL_TABLET | Freq: Every day | ORAL | Status: DC

## 2015-04-13 ENCOUNTER — Telehealth: Payer: Self-pay | Admitting: Interventional Cardiology

## 2015-04-13 NOTE — Telephone Encounter (Signed)
Received clearance form from [facility]:  Pt is scheduled for cataract removal with intraocular lens placement on 04-20-15.  Letter states "we would appreciate your opinion regarding the stability of our pt for approximately 30 minutes under local anesthesia, consisting of topical eye drops or local injection of anesthesia, under monitored anesthesia care."  Letter also states that there is no need to discontinue any blood thinners.  They are requesting medical clearance.   Will forward to Dr. Irish Lack for review and advisement on clearance for procedure.

## 2015-04-14 NOTE — Telephone Encounter (Signed)
Left message for surgical coordinator at [facility] to call office.

## 2015-04-14 NOTE — Telephone Encounter (Signed)
No further testing needed before cataract surgery.

## 2015-04-14 NOTE — Telephone Encounter (Signed)
Spoke with surgical coordinator and gave her information from Dr. Irish Lack.  Will fax this note to office--Fax -[phone]

## 2015-04-27 ENCOUNTER — Ambulatory Visit (INDEPENDENT_AMBULATORY_CARE_PROVIDER_SITE_OTHER): Payer: PPO | Admitting: *Deleted

## 2015-04-27 DIAGNOSIS — I4891 Unspecified atrial fibrillation: Secondary | ICD-10-CM | POA: Diagnosis not present

## 2015-04-27 DIAGNOSIS — Z5181 Encounter for therapeutic drug level monitoring: Secondary | ICD-10-CM

## 2015-04-27 LAB — POCT INR: INR: 2.6

## 2015-05-04 ENCOUNTER — Encounter: Payer: Self-pay | Admitting: Family

## 2015-05-06 ENCOUNTER — Encounter (HOSPITAL_COMMUNITY): Payer: PPO

## 2015-05-06 ENCOUNTER — Ambulatory Visit: Payer: PPO | Admitting: Family

## 2015-05-07 ENCOUNTER — Encounter: Payer: Self-pay | Admitting: Family

## 2015-05-12 ENCOUNTER — Encounter: Payer: Self-pay | Admitting: Family

## 2015-05-12 ENCOUNTER — Ambulatory Visit (INDEPENDENT_AMBULATORY_CARE_PROVIDER_SITE_OTHER): Payer: PPO | Admitting: Family

## 2015-05-12 ENCOUNTER — Ambulatory Visit (HOSPITAL_COMMUNITY)
Admission: RE | Admit: 2015-05-12 | Discharge: 2015-05-12 | Disposition: A | Discharge status: Home / Self Care | Payer: PPO | Source: Ambulatory Visit | Attending: Family | Admitting: Family

## 2015-05-12 VITALS — BP 108/64 | HR 67 | Ht 69.0 in | Wt 177.8 lb

## 2015-05-12 DIAGNOSIS — E785 Hyperlipidemia, unspecified: Secondary | ICD-10-CM | POA: Diagnosis not present

## 2015-05-12 DIAGNOSIS — F17200 Nicotine dependence, unspecified, uncomplicated: Secondary | ICD-10-CM

## 2015-05-12 DIAGNOSIS — Z48812 Encounter for surgical aftercare following surgery on the circulatory system: Secondary | ICD-10-CM | POA: Diagnosis not present

## 2015-05-12 DIAGNOSIS — I6522 Occlusion and stenosis of left carotid artery: Secondary | ICD-10-CM

## 2015-05-12 DIAGNOSIS — E119 Type 2 diabetes mellitus without complications: Secondary | ICD-10-CM | POA: Diagnosis not present

## 2015-05-12 DIAGNOSIS — I6521 Occlusion and stenosis of right carotid artery: Secondary | ICD-10-CM | POA: Diagnosis not present

## 2015-05-12 DIAGNOSIS — Z720 Tobacco use: Secondary | ICD-10-CM | POA: Diagnosis not present

## 2015-05-12 DIAGNOSIS — Z9889 Other specified postprocedural states: Secondary | ICD-10-CM

## 2015-05-12 NOTE — Progress Notes (Signed)
Chief Complaint: Extracranial Carotid Artery Stenosis   History of Present Illness  Cynthia Hansen is a 74 y.o. female patient of Dr. Oneida Alar who is status post left CEA in 2008 by Dr. Amedeo Plenty and returns today for follow up. She has atrial fibrillation which is rate controlled. She is on warfarin for this.   The patient denies any history of TIA or stroke symptoms, specifically the patient denies a history of amaurosis fugax or monocular blindness, denies a history unilateral of facial drooping, denies a history of hemiplegia, and denies a history of receptive or expressive aphasia.  Patient states she has a deteriorating disc in her lumbar spine. She denies claudication symptoms with walking, has low back pain and weakness in legs with climbing stairs, denies non healing wounds.  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 She attends a fitness center 3 days/week. She has intentionally lost 66 pounds in 3 years.  She had a D&C February 2016. Dr. Leighton Ruff is her PCP.  Pt Diabetic: Yes, pt states it is in control Pt smoker: smoker (1/2 ppd, started smoking at age 29-30 yrs). She states she enjoys smoking and has no intention to quit.  Pt meds include: Statin : Yes, changed to pravastatin from Crestor. She is able to pull and push more weights at the gym ASA: No Other anticoagulants/antiplatelets: Coumadin for atrial fib    Past Medical History  Diagnosis Date  . Diabetes mellitus   . Hyperlipidemia   . A-fib ([place])   . Vitamin D deficiency   . Carotid artery occlusion   . Irregular heart beat     Social History Social History  Substance Use Topics  . Smoking status: Current Every Day Smoker -- 50 years    Types: Cigarettes  . Smokeless tobacco: Never Used  . Alcohol Use: 0.0 oz/week    0 Standard drinks or equivalent per week    Family History Family History  Problem Relation Age of Onset  . Cancer Mother     Uterin  . Heart attack Father   .  Hypertension Sister     Surgical History Past Surgical History  Procedure Laterality Date  . Abdominal hernia repair    . Carotid endarterectomy Left Dec. 3, 2008     cea  . Hysteroscopy w/d&c N/A 07/30/2014    Procedure: DILATATION AND CURETTAGE /HYSTEROSCOPY;  Surgeon: Osborne Oman, MD;  Location: [facility];  Service: Gynecology;  Laterality: N/A;  . Iud removal N/A 07/30/2014    Procedure: INTRAUTERINE DEVICE (IUD) REMOVAL;  Surgeon: Osborne Oman, MD;  Location: [facility];  Service: Gynecology;  Laterality: N/A;  . Cataract extraction Left     Allergies  Allergen Reactions  . Avandia [Rosiglitazone] Other (See Comments)    Causes muscle weakness.    Current Outpatient Prescriptions  Medication Sig Dispense Refill  . acetaminophen (TYLENOL) 500 MG tablet Take 1,000 mg by mouth every 6 (six) hours as needed for mild pain (For back pain.).    Marland Kitchen atorvastatin (LIPITOR) 10 MG tablet Take 1 tablet (10 mg total) by mouth daily. 90 tablet 3  . Cholecalciferol (VITAMIN D) 1000 UNITS capsule Take 1 capsule (1,000 Units total) by mouth daily.    Marland Kitchen diltiazem (CARDIZEM CD) 180 MG 24 hr capsule TAKE 1 CAPSULE BY MOUTH TWICE A DAY BEFORE MEALS 60 capsule 6  . furosemide (LASIX) 40 MG tablet Take 40 mg by mouth daily.      Marland Kitchen glimepiride (AMARYL) 1  MG tablet Take 1 mg by mouth daily with breakfast.   1  . HYDROcodone-acetaminophen (NORCO/VICODIN) 5-325 MG per tablet Take 1 tablet by mouth every 6 (six) hours as needed. 30 tablet 0  . KLOR-CON M20 20 MEQ tablet TAKE 1 TABLET BY MOUTH 3 DAYS A WEEK. 50 tablet 1  . lisinopril (PRINIVIL,ZESTRIL) 5 MG tablet Take 5 mg by mouth daily.     . metFORMIN (GLUCOPHAGE) 1000 MG tablet Take 1,000 mg by mouth 2 (two) times daily with a meal.    . ONE TOUCH ULTRA TEST test strip   5  . potassium chloride SA (K-DUR,KLOR-CON) 20 MEQ tablet Take 20 mEq by mouth 3 (three) times a week. Patient takes on Monday, Wednesday and Friday.    . Sodium Bicarbonate-Citric  Acid (ALKA-SELTZER HEARTBURN PO) Take 1 each by mouth daily as needed (For heartburn.).    Marland Kitchen warfarin (COUMADIN) 5 MG tablet Take 1 tablet (5 mg total) by mouth daily. Or as directed by Coumadin clinic (Patient taking differently: Take 2.5 mg by mouth daily. Or as directed by Coumadin clinic) 90 tablet 0   No current facility-administered medications for this visit.    Review of Systems : See HPI for pertinent positives and negatives.  Physical Examination  Filed Vitals:   05/12/15 1147 05/12/15 1149  BP: 122/73 108/64  Pulse: 67   Height: 5\' 9"  (1.753 m)   Weight: 177 lb 12.8 oz (80.65 kg)   SpO2: 99%    Body mass index is 26.24 kg/(m^2).  General: WDWN female in NAD GAIT: normal Eyes: PERRLA Pulmonary: Non-labored, limited air movement in all fields, no rales,  rhonchi, or wheezing.  Cardiac: Irregular rhythm, no detected murmur.  VASCULAR EXAM Carotid Bruits Right Left   Negative Negative   Aorta is not palpable. Radial pulses are 2+ palpable and equal.      LE Pulses Right Left   FEMORAL 2+ palpable 2+ palpable   POPLITEAL not palpable  not palpable   POSTERIOR TIBIAL not palpable  not palpable    DORSALIS PEDIS  ANTERIOR TIBIAL 1+ palpable  1+ palpable     Gastrointestinal: soft, nontender, BS WNL, no r/g, no palpable masses. Musculoskeletal: Generalized mild muscle atrophy/wasting. M/S 5/5 throughout, extremities without ischemic changes.  Neurologic: A&O X 3; Appropriate Affect, Speech is normal CN 2-12 intact except mild hearing loss, is wearing hearing aids, Pain and light touch intact in extremities, Motor exam as listed above.               Non-Invasive Vascular Imaging CAROTID DUPLEX 05/12/2015   CEREBROVASCULAR DUPLEX EVALUATION    INDICATION:  Carotid artery disease     PREVIOUS INTERVENTION(S): Left carotid endarterectomy 05/08/2007    DUPLEX EXAM:     RIGHT  LEFT  Peak Systolic Velocities (cm/s) End Diastolic Velocities (cm/s) Plaque LOCATION Peak Systolic Velocities (cm/s) End Diastolic Velocities (cm/s) Plaque  64 11  CCA PROXIMAL 77 15   67 15  CCA MID 62 19   57 11 HT CCA DISTAL 56 12 HM  218 18 HT ECA 97 14   207 67 HT ICA PROXIMAL 57 15 HM  140 26  ICA MID 45 11   96 27  ICA DISTAL 77 26     3.08 ICA / CCA Ratio (PSV) NA  Antegrade  Vertebral Flow Antegrade    Brachial Systolic Pressure (mmHg)   Multiphasic (Subclavian artery) Brachial Artery Waveforms Multiphasic (Subclavian artery)    Plaque Morphology:  HM =  Homogeneous, HT = Heterogeneous, CP = Calcific Plaque, SP = Smooth Plaque, IP = Irregular Plaque     ADDITIONAL FINDINGS:     IMPRESSION: Right internal carotid artery velocities suggest a 60-79% stenosis.  Patent left carotid endarterectomy site with evidence of mild hyperplasia at the proximal patch.     Compared to the previous exam:  No significant change in comparison to the last exam on 10/22/2014.      Assessment: Cynthia Hansen is a 74 y.o. female who is status post left CEA in 2008. She has no history of stroke or TIA. Today's carotid duplex suggests  60-79% right ICA stenosis and a patent left carotid endarterectomy site with evidence of mild hyperplasia at the proximal patch.   No significant change in comparison to the last exam on 10/22/2014.  She exercises regularly and has intentionally lost 66 pounds in 3 years. Her DM seems to be in control, but unfortunately she continues to smoke and indicates no intention to quit.  Plan:  The patient was counseled re smoking cessation and given several free resources re smoking cessation.  Follow-up in 6 months with Carotid Duplex scan.   I discussed in depth with the patient the nature of atherosclerosis, and emphasized the importance of  maximal medical management including strict control of blood pressure, blood glucose, and lipid levels, obtaining regular exercise, and cessation of smoking.  The patient is aware that without maximal medical management the underlying atherosclerotic disease process will progress, limiting the benefit of any interventions. The patient was given information about stroke prevention and what symptoms should prompt the patient to seek immediate medical care. Thank you for allowing [place] to participate in this patient's care.  Clemon Chambers, RN, MSN, FNP-C [facility] Office: [facility] Physician: Oneida Alar  05/12/2015 11:52 AM

## 2015-05-12 NOTE — Addendum Note (Signed)
Addended by: Thresa Ross C on: 05/12/2015 02:05 PM   Modules accepted: Orders

## 2015-05-12 NOTE — Patient Instructions (Addendum)
Stroke Prevention Some medical conditions and behaviors are associated with an increased chance of having a stroke. You may prevent a stroke by making healthy choices and managing medical conditions. HOW CAN I REDUCE MY RISK OF HAVING A STROKE?   Stay physically active. Get at least 30 minutes of activity on most or all days.  Do not smoke. It may also be helpful to avoid exposure to secondhand smoke.  Limit alcohol use. Moderate alcohol use is considered to be:  No more than 2 drinks per day for men.  No more than 1 drink per day for nonpregnant women.  Eat healthy foods. This involves:  Eating 5 or more servings of fruits and vegetables a day.  Making dietary changes that address high blood pressure (hypertension), high cholesterol, diabetes, or obesity.  Manage your cholesterol levels.  Making food choices that are high in fiber and low in saturated fat, trans fat, and cholesterol may control cholesterol levels.  Take any prescribed medicines to control cholesterol as directed by your health care provider.  Manage your diabetes.  Controlling your carbohydrate and sugar intake is recommended to manage diabetes.  Take any prescribed medicines to control diabetes as directed by your health care provider.  Control your hypertension.  Making food choices that are low in salt (sodium), saturated fat, trans fat, and cholesterol is recommended to manage hypertension.  Ask your health care provider if you need treatment to lower your blood pressure. Take any prescribed medicines to control hypertension as directed by your health care provider.  If you are 18-39 years of age, have your blood pressure checked every 3-5 years. If you are 40 years of age or older, have your blood pressure checked every year.  Maintain a healthy weight.  Reducing calorie intake and making food choices that are low in sodium, saturated fat, trans fat, and cholesterol are recommended to manage  weight.  Stop drug abuse.  Avoid taking birth control pills.  Talk to your health care provider about the risks of taking birth control pills if you are over 35 years old, smoke, get migraines, or have ever had a blood clot.  Get evaluated for sleep disorders (sleep apnea).  Talk to your health care provider about getting a sleep evaluation if you snore a lot or have excessive sleepiness.  Take medicines only as directed by your health care provider.  For some people, aspirin or blood thinners (anticoagulants) are helpful in reducing the risk of forming abnormal blood clots that can lead to stroke. If you have the irregular heart rhythm of atrial fibrillation, you should be on a blood thinner unless there is a good reason you cannot take them.  Understand all your medicine instructions.  Make sure that other conditions (such as anemia or atherosclerosis) are addressed. SEEK IMMEDIATE MEDICAL CARE IF:   You have sudden weakness or numbness of the face, arm, or leg, especially on one side of the body.  Your face or eyelid droops to one side.  You have sudden confusion.  You have trouble speaking (aphasia) or understanding.  You have sudden trouble seeing in one or both eyes.  You have sudden trouble walking.  You have dizziness.  You have a loss of balance or coordination.  You have a sudden, severe headache with no known cause.  You have new chest pain or an irregular heartbeat. Any of these symptoms may represent a serious problem that is an emergency. Do not wait to see if the symptoms will   go away. Get medical help at once. Call your local emergency services (911 in [place] Do not drive yourself to the hospital.   This information is not intended to replace advice given to you by your health care provider. Make sure you discuss any questions you have with your health care provider.   Document Released: 06/29/2004 Document Revised: 06/12/2014 Document Reviewed:  11/22/2012 Elsevier Interactive Patient Education 2016 Elsevier Inc.    Smoking Cessation, Tips for Success If you are ready to quit smoking, congratulations! You have chosen to help yourself be healthier. Cigarettes bring nicotine, tar, carbon monoxide, and other irritants into your body. Your lungs, heart, and blood vessels will be able to work better without these poisons. There are many different ways to quit smoking. Nicotine gum, nicotine patches, a nicotine inhaler, or nicotine nasal spray can help with physical craving. Hypnosis, support groups, and medicines help break the habit of smoking. WHAT THINGS CAN I DO TO MAKE QUITTING EASIER?  Here are some tips to help you quit for good:  Pick a date when you will quit smoking completely. Tell all of your friends and family about your plan to quit on that date.  Do not try to slowly cut down on the number of cigarettes you are smoking. Pick a quit date and quit smoking completely starting on that day.  Throw away all cigarettes.   Clean and remove all ashtrays from your home, work, and car.  On a card, write down your reasons for quitting. Carry the card with you and read it when you get the urge to smoke.  Cleanse your body of nicotine. Drink enough water and fluids to keep your urine clear or pale yellow. Do this after quitting to flush the nicotine from your body.  Learn to predict your moods. Do not let a bad situation be your excuse to have a cigarette. Some situations in your life might tempt you into wanting a cigarette.  Never have "just one" cigarette. It leads to wanting another and another. Remind yourself of your decision to quit.  Change habits associated with smoking. If you smoked while driving or when feeling stressed, try other activities to replace smoking. Stand up when drinking your coffee. Brush your teeth after eating. Sit in a different chair when you read the paper. Avoid alcohol while trying to quit, and try to  drink fewer caffeinated beverages. Alcohol and caffeine may urge you to smoke.  Avoid foods and drinks that can trigger a desire to smoke, such as sugary or spicy foods and alcohol.  Ask people who smoke not to smoke around you.  Have something planned to do right after eating or having a cup of coffee. For example, plan to take a walk or exercise.  Try a relaxation exercise to calm you down and decrease your stress. Remember, you may be tense and nervous for the first 2 weeks after you quit, but this will pass.  Find new activities to keep your hands busy. Play with a pen, coin, or rubber band. Doodle or draw things on paper.  Brush your teeth right after eating. This will help cut down on the craving for the taste of tobacco after meals. You can also try mouthwash.   Use oral substitutes in place of cigarettes. Try using lemon drops, carrots, cinnamon sticks, or chewing gum. Keep them handy so they are available when you have the urge to smoke.  When you have the urge to smoke, try deep breathing.    Designate your home as a nonsmoking area.  If you are a heavy smoker, ask your health care provider about a prescription for nicotine chewing gum. It can ease your withdrawal from nicotine.  Reward yourself. Set aside the cigarette money you save and buy yourself something nice.  Look for support from others. Join a support group or smoking cessation program. Ask someone at home or at work to help you with your plan to quit smoking.  Always ask yourself, "Do I need this cigarette or is this just a reflex?" Tell yourself, "Today, I choose not to smoke," or "I do not want to smoke." You are reminding yourself of your decision to quit.  Do not replace cigarette smoking with electronic cigarettes (commonly called e-cigarettes). The safety of e-cigarettes is unknown, and some may contain harmful chemicals.  If you relapse, do not give up! Plan ahead and think about what you will do the next  time you get the urge to smoke. HOW WILL I FEEL WHEN I QUIT SMOKING? You may have symptoms of withdrawal because your body is used to nicotine (the addictive substance in cigarettes). You may crave cigarettes, be irritable, feel very hungry, cough often, get headaches, or have difficulty concentrating. The withdrawal symptoms are only temporary. They are strongest when you first quit but will go away within 10-14 days. When withdrawal symptoms occur, stay in control. Think about your reasons for quitting. Remind yourself that these are signs that your body is healing and getting used to being without cigarettes. Remember that withdrawal symptoms are easier to treat than the major diseases that smoking can cause.  Even after the withdrawal is over, expect periodic urges to smoke. However, these cravings are generally short lived and will go away whether you smoke or not. Do not smoke! WHAT RESOURCES ARE AVAILABLE TO HELP ME QUIT SMOKING? Your health care provider can direct you to community resources or hospitals for support, which may include:  Group support.  Education.  Hypnosis.  Therapy.   This information is not intended to replace advice given to you by your health care provider. Make sure you discuss any questions you have with your health care provider.   Document Released: 02/18/2004 Document Revised: 06/12/2014 Document Reviewed: 11/07/2012 Elsevier Interactive Patient Education 2016 Elsevier Inc.   Steps to Quit Smoking  Smoking tobacco can be harmful to your health and can affect almost every organ in your body. Smoking puts you, and those around you, at risk for developing many serious chronic diseases. Quitting smoking is difficult, but it is one of the best things that you can do for your health. It is never too late to quit. WHAT ARE THE BENEFITS OF QUITTING SMOKING? When you quit smoking, you lower your risk of developing serious diseases and conditions, such as:  Lung  cancer or lung disease, such as COPD.  Heart disease.  Stroke.  Heart attack.  Infertility.  Osteoporosis and bone fractures. Additionally, symptoms such as coughing, wheezing, and shortness of breath may get better when you quit. You may also find that you get sick less often because your body is stronger at fighting off colds and infections. If you are pregnant, quitting smoking can help to reduce your chances of having a baby of low birth weight. HOW DO I GET READY TO QUIT? When you decide to quit smoking, create a plan to make sure that you are successful. Before you quit:  Pick a date to quit. Set a date within the   next two weeks to give you time to prepare.  Write down the reasons why you are quitting. Keep this list in places where you will see it often, such as on your bathroom mirror or in your car or wallet.  Identify the people, places, things, and activities that make you want to smoke (triggers) and avoid them. Make sure to take these actions:  Throw away all cigarettes at home, at work, and in your car.  Throw away smoking accessories, such as ashtrays and lighters.  Clean your car and make sure to empty the ashtray.  Clean your home, including curtains and carpets.  Tell your family, friends, and coworkers that you are quitting. Support from your loved ones can make quitting easier.  Talk with your health care provider about your options for quitting smoking.  Find out what treatment options are covered by your health insurance. WHAT STRATEGIES CAN I USE TO QUIT SMOKING?  Talk with your healthcare provider about different strategies to quit smoking. Some strategies include:  Quitting smoking altogether instead of gradually lessening how much you smoke over a period of time. Research shows that quitting "cold turkey" is more successful than gradually quitting.  Attending in-person counseling to help you build problem-solving skills. You are more likely to have  success in quitting if you attend several counseling sessions. Even short sessions of 10 minutes can be effective.  Finding resources and support systems that can help you to quit smoking and remain smoke-free after you quit. These resources are most helpful when you use them often. They can include:  Online chats with a counselor.  Telephone quitlines.  Printed self-help materials.  Support groups or group counseling.  Text messaging programs.  Mobile phone applications.  Taking medicines to help you quit smoking. (If you are pregnant or breastfeeding, talk with your health care provider first.) Some medicines contain nicotine and some do not. Both types of medicines help with cravings, but the medicines that include nicotine help to relieve withdrawal symptoms. Your health care provider may recommend:  Nicotine patches, gum, or lozenges.  Nicotine inhalers or sprays.  Non-nicotine medicine that is taken by mouth. Talk with your health care provider about combining strategies, such as taking medicines while you are also receiving in-person counseling. Using these two strategies together makes you more likely to succeed in quitting than if you used either strategy on its own. If you are pregnant or breastfeeding, talk with your health care provider about finding counseling or other support strategies to quit smoking. Do not take medicine to help you quit smoking unless told to do so by your health care provider. WHAT THINGS CAN I DO TO MAKE IT EASIER TO QUIT? Quitting smoking might feel overwhelming at first, but there is a lot that you can do to make it easier. Take these important actions:  Reach out to your family and friends and ask that they support and encourage you during this time. Call telephone quitlines, reach out to support groups, or work with a counselor for support.  Ask people who smoke to avoid smoking around you.  Avoid places that trigger you to smoke, such as bars,  parties, or smoke-break areas at work.  Spend time around people who do not smoke.  Lessen stress in your life, because stress can be a smoking trigger for some people. To lessen stress, try:  Exercising regularly.  Deep-breathing exercises.  Yoga.  Meditating.  Performing a body scan. This involves closing your eyes, scanning   your body from head to toe, and noticing which parts of your body are particularly tense. Purposefully relax the muscles in those areas.  Download or purchase mobile phone or tablet apps (applications) that can help you stick to your quit plan by providing reminders, tips, and encouragement. There are many free apps, such as QuitGuide from the CDC (Centers for Disease Control and Prevention). You can find other support for quitting smoking (smoking cessation) through smokefree.gov and other websites. HOW WILL I FEEL WHEN I QUIT SMOKING? Within the first 24 hours of quitting smoking, you may start to feel some withdrawal symptoms. These symptoms are usually most noticeable 2-3 days after quitting, but they usually do not last beyond 2-3 weeks. Changes or symptoms that you might experience include:  Mood swings.  Restlessness, anxiety, or irritation.  Difficulty concentrating.  Dizziness.  Strong cravings for sugary foods in addition to nicotine.  Mild weight gain.  Constipation.  Nausea.  Coughing or a sore throat.  Changes in how your medicines work in your body.  A depressed mood.  Difficulty sleeping (insomnia). After the first 2-3 weeks of quitting, you may start to notice more positive results, such as:  Improved sense of smell and taste.  Decreased coughing and sore throat.  Slower heart rate.  Lower blood pressure.  Clearer skin.  The ability to breathe more easily.  Fewer sick days. Quitting smoking is very challenging for most people. Do not get discouraged if you are not successful the first time. Some people need to make many  attempts to quit before they achieve long-term success. Do your best to stick to your quit plan, and talk with your health care provider if you have any questions or concerns.   This information is not intended to replace advice given to you by your health care provider. Make sure you discuss any questions you have with your health care provider.   Document Released: 05/16/2001 Document Revised: 10/06/2014 Document Reviewed: 10/06/2014 Elsevier Interactive Patient Education 2016 Elsevier Inc.   

## 2015-05-25 ENCOUNTER — Ambulatory Visit (INDEPENDENT_AMBULATORY_CARE_PROVIDER_SITE_OTHER): Payer: PPO | Admitting: *Deleted

## 2015-05-25 DIAGNOSIS — I4891 Unspecified atrial fibrillation: Secondary | ICD-10-CM | POA: Diagnosis not present

## 2015-05-25 DIAGNOSIS — Z5181 Encounter for therapeutic drug level monitoring: Secondary | ICD-10-CM | POA: Diagnosis not present

## 2015-05-25 LAB — POCT INR: INR: 2.8

## 2015-06-15 ENCOUNTER — Other Ambulatory Visit: Payer: Self-pay | Admitting: Interventional Cardiology

## 2015-06-29 ENCOUNTER — Ambulatory Visit (INDEPENDENT_AMBULATORY_CARE_PROVIDER_SITE_OTHER): Payer: PPO | Admitting: *Deleted

## 2015-06-29 DIAGNOSIS — Z5181 Encounter for therapeutic drug level monitoring: Secondary | ICD-10-CM | POA: Diagnosis not present

## 2015-06-29 DIAGNOSIS — I4891 Unspecified atrial fibrillation: Secondary | ICD-10-CM

## 2015-06-29 LAB — POCT INR: INR: 4.1

## 2015-07-13 ENCOUNTER — Ambulatory Visit (INDEPENDENT_AMBULATORY_CARE_PROVIDER_SITE_OTHER): Payer: PPO

## 2015-07-13 DIAGNOSIS — I4891 Unspecified atrial fibrillation: Secondary | ICD-10-CM | POA: Diagnosis not present

## 2015-07-13 DIAGNOSIS — Z5181 Encounter for therapeutic drug level monitoring: Secondary | ICD-10-CM | POA: Diagnosis not present

## 2015-07-13 LAB — POCT INR: INR: 2.7

## 2015-07-13 MED ORDER — WARFARIN SODIUM 5 MG PO TABS: ORAL_TABLET | ORAL | Status: DC

## 2015-07-31 ENCOUNTER — Other Ambulatory Visit: Payer: Self-pay | Admitting: Interventional Cardiology

## 2015-08-02 ENCOUNTER — Other Ambulatory Visit: Payer: Self-pay | Admitting: *Deleted

## 2015-08-10 ENCOUNTER — Ambulatory Visit (INDEPENDENT_AMBULATORY_CARE_PROVIDER_SITE_OTHER): Payer: PPO | Admitting: *Deleted

## 2015-08-10 DIAGNOSIS — I4891 Unspecified atrial fibrillation: Secondary | ICD-10-CM | POA: Diagnosis not present

## 2015-08-10 DIAGNOSIS — Z5181 Encounter for therapeutic drug level monitoring: Secondary | ICD-10-CM

## 2015-08-10 LAB — POCT INR: INR: 2.2

## 2015-09-07 ENCOUNTER — Ambulatory Visit (INDEPENDENT_AMBULATORY_CARE_PROVIDER_SITE_OTHER): Payer: PPO | Admitting: *Deleted

## 2015-09-07 DIAGNOSIS — I4891 Unspecified atrial fibrillation: Secondary | ICD-10-CM

## 2015-09-07 DIAGNOSIS — Z5181 Encounter for therapeutic drug level monitoring: Secondary | ICD-10-CM | POA: Diagnosis not present

## 2015-09-07 LAB — POCT INR: INR: 3.2

## 2015-09-10 DIAGNOSIS — L03115 Cellulitis of right lower limb: Secondary | ICD-10-CM | POA: Diagnosis not present

## 2015-09-23 DIAGNOSIS — E119 Type 2 diabetes mellitus without complications: Secondary | ICD-10-CM | POA: Diagnosis not present

## 2015-09-23 DIAGNOSIS — K219 Gastro-esophageal reflux disease without esophagitis: Secondary | ICD-10-CM | POA: Diagnosis not present

## 2015-09-23 DIAGNOSIS — Z7984 Long term (current) use of oral hypoglycemic drugs: Secondary | ICD-10-CM | POA: Diagnosis not present

## 2015-09-23 DIAGNOSIS — N183 Chronic kidney disease, stage 3 (moderate): Secondary | ICD-10-CM | POA: Diagnosis not present

## 2015-09-23 DIAGNOSIS — E782 Mixed hyperlipidemia: Secondary | ICD-10-CM | POA: Diagnosis not present

## 2015-09-23 DIAGNOSIS — D509 Iron deficiency anemia, unspecified: Secondary | ICD-10-CM | POA: Diagnosis not present

## 2015-09-23 DIAGNOSIS — I10 Essential (primary) hypertension: Secondary | ICD-10-CM | POA: Diagnosis not present

## 2015-09-23 DIAGNOSIS — E559 Vitamin D deficiency, unspecified: Secondary | ICD-10-CM | POA: Diagnosis not present

## 2015-09-23 DIAGNOSIS — I4891 Unspecified atrial fibrillation: Secondary | ICD-10-CM | POA: Diagnosis not present

## 2015-09-23 DIAGNOSIS — Z0000 Encounter for general adult medical examination without abnormal findings: Secondary | ICD-10-CM | POA: Diagnosis not present

## 2015-09-23 DIAGNOSIS — Z1389 Encounter for screening for other disorder: Secondary | ICD-10-CM | POA: Diagnosis not present

## 2015-09-23 DIAGNOSIS — Z23 Encounter for immunization: Secondary | ICD-10-CM | POA: Diagnosis not present

## 2015-09-23 DIAGNOSIS — E2839 Other primary ovarian failure: Secondary | ICD-10-CM | POA: Diagnosis not present

## 2015-09-24 ENCOUNTER — Other Ambulatory Visit: Payer: Self-pay

## 2015-09-24 DIAGNOSIS — Z1231 Encounter for screening mammogram for malignant neoplasm of breast: Secondary | ICD-10-CM

## 2015-10-05 ENCOUNTER — Ambulatory Visit (INDEPENDENT_AMBULATORY_CARE_PROVIDER_SITE_OTHER): Payer: PPO | Admitting: *Deleted

## 2015-10-05 DIAGNOSIS — I4891 Unspecified atrial fibrillation: Secondary | ICD-10-CM | POA: Diagnosis not present

## 2015-10-05 DIAGNOSIS — Z5181 Encounter for therapeutic drug level monitoring: Secondary | ICD-10-CM

## 2015-10-05 LAB — POCT INR: INR: 3.2

## 2015-10-21 ENCOUNTER — Ambulatory Visit
Admission: RE | Admit: 2015-10-21 | Discharge: 2015-10-21 | Disposition: A | Discharge status: Home / Self Care | Payer: PPO | Source: Ambulatory Visit

## 2015-10-21 DIAGNOSIS — Z1231 Encounter for screening mammogram for malignant neoplasm of breast: Secondary | ICD-10-CM

## 2015-10-21 DIAGNOSIS — Z780 Asymptomatic menopausal state: Secondary | ICD-10-CM | POA: Diagnosis not present

## 2015-10-27 ENCOUNTER — Ambulatory Visit (INDEPENDENT_AMBULATORY_CARE_PROVIDER_SITE_OTHER): Payer: PPO | Admitting: Surgery

## 2015-10-27 ENCOUNTER — Ambulatory Visit (INDEPENDENT_AMBULATORY_CARE_PROVIDER_SITE_OTHER): Payer: PPO | Admitting: Interventional Cardiology

## 2015-10-27 ENCOUNTER — Encounter: Payer: Self-pay | Admitting: Interventional Cardiology

## 2015-10-27 VITALS — BP 110/60 | HR 55 | Ht 68.0 in | Wt 181.8 lb

## 2015-10-27 DIAGNOSIS — I10 Essential (primary) hypertension: Secondary | ICD-10-CM

## 2015-10-27 DIAGNOSIS — E785 Hyperlipidemia, unspecified: Secondary | ICD-10-CM | POA: Diagnosis not present

## 2015-10-27 DIAGNOSIS — Z5181 Encounter for therapeutic drug level monitoring: Secondary | ICD-10-CM

## 2015-10-27 DIAGNOSIS — I779 Disorder of arteries and arterioles, unspecified: Secondary | ICD-10-CM

## 2015-10-27 DIAGNOSIS — I739 Peripheral vascular disease, unspecified: Secondary | ICD-10-CM

## 2015-10-27 DIAGNOSIS — I4891 Unspecified atrial fibrillation: Secondary | ICD-10-CM

## 2015-10-27 DIAGNOSIS — E1159 Type 2 diabetes mellitus with other circulatory complications: Secondary | ICD-10-CM

## 2015-10-27 DIAGNOSIS — I6523 Occlusion and stenosis of bilateral carotid arteries: Secondary | ICD-10-CM | POA: Diagnosis not present

## 2015-10-27 LAB — POCT INR: INR: 2.2

## 2015-10-27 NOTE — Progress Notes (Signed)
Cardiology Office Note    Date:  10/27/2015   ID:  Cynthia Hansen, DOB 07/28/1940, MRN [id number]  PCP:  Gerrit Heck, MD  Cardiologist:   Larae Grooms, MD   No chief complaint on file. Follow-up atrial fibrillation  History of Present Illness:  Cynthia Hansen is a 75 y.o. female who presents for yearly check up. She has a history of atrial fibrillation on warfarin. She underwent cardioversion back in 2007. This lasted for about a year but she reverted back to atrial fibrillation. She has been rate controlled since that time. She also has hypertension, hyperlipidemia, DM, smoker(1/2 ppd), and PVD with 50-69% RICA and patent Left carotid endarterectomy 10/22/14 followed by Dr. Oneida Alar.  Patient comes in today for her yearly checkup. She has lost 50 pounds over the past 2 and half years, and maintained the wieght loss. She is exercising 3 days a week at [facility] cardiac rehabilitation and watching her diet. Recent lipid check in September 2016 showed well-controlled lipids on atorvastatin.   She denies chest pain, palpitations, dyspnea, dyspnea on exertion, or presyncope. She continued to smoke half a pack of cigarettes daily until 10/06/2015 when she quit.. She had quit in the past for 3 years but resumed. She wants to continue living healthy lifestyle and continue losing weight.    Past Medical History  Diagnosis Date  . Diabetes mellitus   . Hyperlipidemia   . A-fib ([place])   . Vitamin D deficiency   . Carotid artery occlusion   . Irregular heart beat     Past Surgical History  Procedure Laterality Date  . Abdominal hernia repair    . Carotid endarterectomy Left Dec. 3, 2008     cea  . Hysteroscopy w/d&c N/A 07/30/2014    Procedure: DILATATION AND CURETTAGE /HYSTEROSCOPY;  Surgeon: Osborne Oman, MD;  Location: [facility];  Service: Gynecology;  Laterality: N/A;  . Iud removal N/A 07/30/2014    Procedure: INTRAUTERINE DEVICE (IUD) REMOVAL;  Surgeon: Osborne Oman, MD;  Location: [facility];  Service: Gynecology;  Laterality: N/A;  . Cataract extraction Left     Current Medications: Outpatient Prescriptions Prior to Visit  Medication Sig Dispense Refill  . acetaminophen (TYLENOL) 500 MG tablet Take 1,000 mg by mouth every 6 (six) hours as needed for mild pain (For back pain.).    Marland Kitchen atorvastatin (LIPITOR) 10 MG tablet Take 1 tablet (10 mg total) by mouth daily. 90 tablet 3  . Cholecalciferol (VITAMIN D) 1000 UNITS capsule Take 1 capsule (1,000 Units total) by mouth daily.    Marland Kitchen diltiazem (CARDIZEM CD) 180 MG 24 hr capsule TAKE 1 CAPSULE BY MOUTH TWICE A DAY BEFORE MEALS 60 capsule 5  . furosemide (LASIX) 40 MG tablet Take 40 mg by mouth daily.      Marland Kitchen glimepiride (AMARYL) 1 MG tablet Take 1 mg by mouth daily with breakfast.   1  . KLOR-CON M20 20 MEQ tablet TAKE 1 TABLET BY MOUTH 3 DAYS A WEEK. 50 tablet 1  . lisinopril (PRINIVIL,ZESTRIL) 5 MG tablet Take 5 mg by mouth daily.     . metFORMIN (GLUCOPHAGE) 1000 MG tablet Take 1,000 mg by mouth 2 (two) times daily with a meal.    . ONE TOUCH ULTRA TEST test strip 1 each by Other route as needed.   5  . potassium chloride SA (K-DUR,KLOR-CON) 20 MEQ tablet Take 20 mEq by mouth 3 (three) times a week. Patient takes on Monday, Wednesday and  Friday.    . Sodium Bicarbonate-Citric Acid (ALKA-SELTZER HEARTBURN PO) Take 1 each by mouth daily as needed (For heartburn.).    Marland Kitchen warfarin (COUMADIN) 5 MG tablet Take as directed by coumadin clinic 90 tablet 0  . HYDROcodone-acetaminophen (NORCO/VICODIN) 5-325 MG per tablet Take 1 tablet by mouth every 6 (six) hours as needed. (Patient not taking: Reported on 10/27/2015) 30 tablet 0   No facility-administered medications prior to visit.     Allergies:   Avandia   Social History   Social History  . Marital Status: Married    Spouse Name: N/A  . Number of Children: N/A  . Years of Education: N/A   Social History Main Topics  . Smoking status: Former Smoker --  50 years    Types: Cigarettes    Quit date: 10/06/2015  . Smokeless tobacco: Never Used  . Alcohol Use: 0.0 oz/week    0 Standard drinks or equivalent per week  . Drug Use: No  . Sexual Activity: Not Asked   Other Topics Concern  . None   Social History Narrative     Family History:  The patient's family history includes Cancer in her mother; Heart attack in her father; Hypertension in her sister.   ROS:   Please see the history of present illness.    ROS All other systems reviewed and are negative.   PHYSICAL EXAM:   VS:  BP 110/60 mmHg  Pulse 55  Ht 5\' 8"  (1.727 m)  Wt 181 lb 12.8 oz (82.464 kg)  BMI 27.65 kg/m2   GEN: Well nourished, well developed, in no acute distress HEENT: normal Neck: no JVD, carotid bruits, or masses Cardiac: Irregularly irregular; no murmurs, rubs, or gallops,no edema  Respiratory:  clear to auscultation bilaterally, normal work of breathing GI: soft, nontender, nondistended, + BS MS: no deformity or atrophy Skin: warm and dry, no rash Neuro:  Alert and Oriented x 3, Strength and sensation are intact Psych: euthymic mood, full affect  Wt Readings from Last 3 Encounters:  10/27/15 181 lb 12.8 oz (82.464 kg)  05/12/15 177 lb 12.8 oz (80.65 kg)  10/26/14 184 lb (83.462 kg)      Studies/Labs Reviewed:   EKG:  EKG is  ordered today.  The ekg ordered today demonstrates Atrial fibrillation, rate controlled  Recent Labs: 02/16/2015: ALT 5   Lipid Panel    Component Value Date/Time   CHOL 117 02/16/2015 0850   TRIG 91.0 02/16/2015 0850   HDL 41.20 02/16/2015 0850   CHOLHDL 3 02/16/2015 0850   VLDL 18.2 02/16/2015 0850   LDLCALC 58 02/16/2015 0850    Additional studies/ records that were reviewed today include:  2007 echo: normal LV function    ASSESSMENT:    1. Essential hypertension, benign   2. Atrial fibrillation, unspecified type ([place])   3. Bilateral carotid artery disease ([place])      PLAN:  In order of problems listed  above:  1. Atrial fibrillation: Rate controlled. Coumadin for stroke prevention.    Check echo if sx return or if there is a change in exercise tolerance. No palpitations at this time.   2. HTN: Well controlled. Continue current medicines 3. Carotid disease: Followed by the vascular surgeons. 4. Hyperlipidemia: Continue atorvastatin. We discussed red yeast Rice and I think she is stable on the atorvastatin with normal liver function. I would not add any supplement at this time.  Continue regular exercise program trying to get 150 minutes a week of aerobic  exercise. 5. Diabetes: Managed by primary care physician. Continue healthy lifestyle.    Medication Adjustments/Labs and Tests Ordered: Current medicines are reviewed at length with the patient today.  Concerns regarding medicines are outlined above.  Medication changes, Labs and Tests ordered today are listed in the Patient Instructions below. There are no Patient Instructions on file for this visit.   Signed, Larae Grooms, MD  10/27/2015 9:57 AM    [facility] [address], [place], [place]  [address] Phone: [phone]; Fax: [phone]

## 2015-10-27 NOTE — Patient Instructions (Signed)
Medication Instructions:  Same-no changes  Labwork: CMET and Lipids on February 14, 2016. Please do not eat or drink after midnight the night before labs are drawn.  Testing/Procedures: None  Follow-Up: Your physician wants you to follow-up in: 1 year. You will receive a reminder letter in the mail two months in advance. If you don't receive a letter, please call our office to schedule the follow-up appointment.      If you need a refill on your cardiac medications before your next appointment, please call your pharmacy.

## 2015-10-29 ENCOUNTER — Other Ambulatory Visit: Payer: Self-pay | Admitting: Interventional Cardiology

## 2015-11-11 ENCOUNTER — Encounter: Payer: Self-pay | Admitting: Family

## 2015-11-18 ENCOUNTER — Ambulatory Visit (INDEPENDENT_AMBULATORY_CARE_PROVIDER_SITE_OTHER): Payer: PPO | Admitting: Family

## 2015-11-18 ENCOUNTER — Ambulatory Visit (HOSPITAL_COMMUNITY)
Admission: RE | Admit: 2015-11-18 | Discharge: 2015-11-18 | Disposition: A | Discharge status: Home / Self Care | Payer: PPO | Source: Ambulatory Visit | Attending: Family | Admitting: Family

## 2015-11-18 ENCOUNTER — Encounter: Payer: Self-pay | Admitting: Family

## 2015-11-18 VITALS — BP 122/73 | HR 67 | Temp 98.0°F | Resp 16 | Ht 68.5 in | Wt 177.0 lb

## 2015-11-18 DIAGNOSIS — E119 Type 2 diabetes mellitus without complications: Secondary | ICD-10-CM | POA: Insufficient documentation

## 2015-11-18 DIAGNOSIS — I6523 Occlusion and stenosis of bilateral carotid arteries: Secondary | ICD-10-CM

## 2015-11-18 DIAGNOSIS — Z9889 Other specified postprocedural states: Secondary | ICD-10-CM

## 2015-11-18 DIAGNOSIS — Z48812 Encounter for surgical aftercare following surgery on the circulatory system: Secondary | ICD-10-CM

## 2015-11-18 DIAGNOSIS — Z720 Tobacco use: Secondary | ICD-10-CM | POA: Diagnosis not present

## 2015-11-18 DIAGNOSIS — I6521 Occlusion and stenosis of right carotid artery: Secondary | ICD-10-CM | POA: Insufficient documentation

## 2015-11-18 DIAGNOSIS — E785 Hyperlipidemia, unspecified: Secondary | ICD-10-CM | POA: Diagnosis not present

## 2015-11-18 DIAGNOSIS — F17200 Nicotine dependence, unspecified, uncomplicated: Secondary | ICD-10-CM

## 2015-11-18 NOTE — Patient Instructions (Signed)
Stroke Prevention Some medical conditions and behaviors are associated with an increased chance of having a stroke. You may prevent a stroke by making healthy choices and managing medical conditions. HOW CAN I REDUCE MY RISK OF HAVING A STROKE?   Stay physically active. Get at least 30 minutes of activity on most or all days.  Do not smoke. It may also be helpful to avoid exposure to secondhand smoke.  Limit alcohol use. Moderate alcohol use is considered to be:  No more than 2 drinks per day for men.  No more than 1 drink per day for nonpregnant women.  Eat healthy foods. This involves:  Eating 5 or more servings of fruits and vegetables a day.  Making dietary changes that address high blood pressure (hypertension), high cholesterol, diabetes, or obesity.  Manage your cholesterol levels.  Making food choices that are high in fiber and low in saturated fat, trans fat, and cholesterol may control cholesterol levels.  Take any prescribed medicines to control cholesterol as directed by your health care provider.  Manage your diabetes.  Controlling your carbohydrate and sugar intake is recommended to manage diabetes.  Take any prescribed medicines to control diabetes as directed by your health care provider.  Control your hypertension.  Making food choices that are low in salt (sodium), saturated fat, trans fat, and cholesterol is recommended to manage hypertension.  Ask your health care provider if you need treatment to lower your blood pressure. Take any prescribed medicines to control hypertension as directed by your health care provider.  If you are 18-39 years of age, have your blood pressure checked every 3-5 years. If you are 40 years of age or older, have your blood pressure checked every year.  Maintain a healthy weight.  Reducing calorie intake and making food choices that are low in sodium, saturated fat, trans fat, and cholesterol are recommended to manage  weight.  Stop drug abuse.  Avoid taking birth control pills.  Talk to your health care provider about the risks of taking birth control pills if you are over 35 years old, smoke, get migraines, or have ever had a blood clot.  Get evaluated for sleep disorders (sleep apnea).  Talk to your health care provider about getting a sleep evaluation if you snore a lot or have excessive sleepiness.  Take medicines only as directed by your health care provider.  For some people, aspirin or blood thinners (anticoagulants) are helpful in reducing the risk of forming abnormal blood clots that can lead to stroke. If you have the irregular heart rhythm of atrial fibrillation, you should be on a blood thinner unless there is a good reason you cannot take them.  Understand all your medicine instructions.  Make sure that other conditions (such as anemia or atherosclerosis) are addressed. SEEK IMMEDIATE MEDICAL CARE IF:   You have sudden weakness or numbness of the face, arm, or leg, especially on one side of the body.  Your face or eyelid droops to one side.  You have sudden confusion.  You have trouble speaking (aphasia) or understanding.  You have sudden trouble seeing in one or both eyes.  You have sudden trouble walking.  You have dizziness.  You have a loss of balance or coordination.  You have a sudden, severe headache with no known cause.  You have new chest pain or an irregular heartbeat. Any of these symptoms may represent a serious problem that is an emergency. Do not wait to see if the symptoms will   go away. Get medical help at once. Call your local emergency services (911 in [place] Do not drive yourself to the hospital.   This information is not intended to replace advice given to you by your health care provider. Make sure you discuss any questions you have with your health care provider.   Document Released: 06/29/2004 Document Revised: 06/12/2014 Document Reviewed:  11/22/2012 Elsevier Interactive Patient Education 2016 Elsevier Inc.     Steps to Quit Smoking  Smoking tobacco can be harmful to your health and can affect almost every organ in your body. Smoking puts you, and those around you, at risk for developing many serious chronic diseases. Quitting smoking is difficult, but it is one of the best things that you can do for your health. It is never too late to quit. WHAT ARE THE BENEFITS OF QUITTING SMOKING? When you quit smoking, you lower your risk of developing serious diseases and conditions, such as:  Lung cancer or lung disease, such as COPD.  Heart disease.  Stroke.  Heart attack.  Infertility.  Osteoporosis and bone fractures. Additionally, symptoms such as coughing, wheezing, and shortness of breath may get better when you quit. You may also find that you get sick less often because your body is stronger at fighting off colds and infections. If you are pregnant, quitting smoking can help to reduce your chances of having a baby of low birth weight. HOW DO I GET READY TO QUIT? When you decide to quit smoking, create a plan to make sure that you are successful. Before you quit:  Pick a date to quit. Set a date within the next two weeks to give you time to prepare.  Write down the reasons why you are quitting. Keep this list in places where you will see it often, such as on your bathroom mirror or in your car or wallet.  Identify the people, places, things, and activities that make you want to smoke (triggers) and avoid them. Make sure to take these actions:  Throw away all cigarettes at home, at work, and in your car.  Throw away smoking accessories, such as ashtrays and lighters.  Clean your car and make sure to empty the ashtray.  Clean your home, including curtains and carpets.  Tell your family, friends, and coworkers that you are quitting. Support from your loved ones can make quitting easier.  Talk with your health care  provider about your options for quitting smoking.  Find out what treatment options are covered by your health insurance. WHAT STRATEGIES CAN I USE TO QUIT SMOKING?  Talk with your healthcare provider about different strategies to quit smoking. Some strategies include:  Quitting smoking altogether instead of gradually lessening how much you smoke over a period of time. Research shows that quitting "cold turkey" is more successful than gradually quitting.  Attending in-person counseling to help you build problem-solving skills. You are more likely to have success in quitting if you attend several counseling sessions. Even short sessions of 10 minutes can be effective.  Finding resources and support systems that can help you to quit smoking and remain smoke-free after you quit. These resources are most helpful when you use them often. They can include:  Online chats with a counselor.  Telephone quitlines.  Printed self-help materials.  Support groups or group counseling.  Text messaging programs.  Mobile phone applications.  Taking medicines to help you quit smoking. (If you are pregnant or breastfeeding, talk with your health care provider first.) Some   medicines contain nicotine and some do not. Both types of medicines help with cravings, but the medicines that include nicotine help to relieve withdrawal symptoms. Your health care provider may recommend:  Nicotine patches, gum, or lozenges.  Nicotine inhalers or sprays.  Non-nicotine medicine that is taken by mouth. Talk with your health care provider about combining strategies, such as taking medicines while you are also receiving in-person counseling. Using these two strategies together makes you more likely to succeed in quitting than if you used either strategy on its own. If you are pregnant or breastfeeding, talk with your health care provider about finding counseling or other support strategies to quit smoking. Do not take  medicine to help you quit smoking unless told to do so by your health care provider. WHAT THINGS CAN I DO TO MAKE IT EASIER TO QUIT? Quitting smoking might feel overwhelming at first, but there is a lot that you can do to make it easier. Take these important actions:  Reach out to your family and friends and ask that they support and encourage you during this time. Call telephone quitlines, reach out to support groups, or work with a counselor for support.  Ask people who smoke to avoid smoking around you.  Avoid places that trigger you to smoke, such as bars, parties, or smoke-break areas at work.  Spend time around people who do not smoke.  Lessen stress in your life, because stress can be a smoking trigger for some people. To lessen stress, try:  Exercising regularly.  Deep-breathing exercises.  Yoga.  Meditating.  Performing a body scan. This involves closing your eyes, scanning your body from head to toe, and noticing which parts of your body are particularly tense. Purposefully relax the muscles in those areas.  Download or purchase mobile phone or tablet apps (applications) that can help you stick to your quit plan by providing reminders, tips, and encouragement. There are many free apps, such as QuitGuide from the CDC (Centers for Disease Control and Prevention). You can find other support for quitting smoking (smoking cessation) through smokefree.gov and other websites. HOW WILL I FEEL WHEN I QUIT SMOKING? Within the first 24 hours of quitting smoking, you may start to feel some withdrawal symptoms. These symptoms are usually most noticeable 2-3 days after quitting, but they usually do not last beyond 2-3 weeks. Changes or symptoms that you might experience include:  Mood swings.  Restlessness, anxiety, or irritation.  Difficulty concentrating.  Dizziness.  Strong cravings for sugary foods in addition to nicotine.  Mild weight  gain.  Constipation.  Nausea.  Coughing or a sore throat.  Changes in how your medicines work in your body.  A depressed mood.  Difficulty sleeping (insomnia). After the first 2-3 weeks of quitting, you may start to notice more positive results, such as:  Improved sense of smell and taste.  Decreased coughing and sore throat.  Slower heart rate.  Lower blood pressure.  Clearer skin.  The ability to breathe more easily.  Fewer sick days. Quitting smoking is very challenging for most people. Do not get discouraged if you are not successful the first time. Some people need to make many attempts to quit before they achieve long-term success. Do your best to stick to your quit plan, and talk with your health care provider if you have any questions or concerns.   This information is not intended to replace advice given to you by your health care provider. Make sure you discuss any questions   you have with your health care provider.   Document Released: 05/16/2001 Document Revised: 10/06/2014 Document Reviewed: 10/06/2014 Elsevier Interactive Patient Education 2016 Elsevier Inc.  

## 2015-11-18 NOTE — Progress Notes (Signed)
Chief Complaint: Follow up Extracranial Carotid Artery Stenosis   History of Present Illness  Cynthia Hansen is a 75 y.o. female patient of Dr. Oneida Alar who is status post left CEA in 2008 by Dr. Amedeo Plenty and returns today for follow up. She has atrial fibrillation which is rate controlled. She is on warfarin for this.   The patient denies any history of TIA or stroke symptoms, specifically the patient denies a history of amaurosis fugax or monocular blindness, denies a history unilateral of facial drooping, denies a history of hemiplegia, and denies a history of receptive or expressive aphasia.  Patient states she has a deteriorating disc in her lumbar spine. She denies claudication symptoms with walking, has low back pain and weakness in legs with climbing stairs, denies non healing wounds.  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 She attends a fitness center 3 days/week. She has intentionally lost 66 pounds in 3 years.  She had a D&C February 2016. Dr. Leighton Ruff is her PCP.  Pt Diabetic: Yes, pt states it is in control Pt smoker: smoker (1/2 ppd, started smoking at age [age] yrs). She states she enjoys smoking and has no intention to quit.  Pt meds include: Statin : Yes, changed to pravastatin from Crestor. She is able to pull and push more weights at the gym ASA: No Other anticoagulants/antiplatelets: Coumadin for atrial fib    Past Medical History  Diagnosis Date  . Diabetes mellitus   . Hyperlipidemia   . A-fib ([place])   . Vitamin D deficiency   . Carotid artery occlusion   . Irregular heart beat     Social History Social History  Substance Use Topics  . Smoking status: Former Smoker -- 50 years    Types: Cigarettes    Quit date: 10/06/2015  . Smokeless tobacco: Never Used  . Alcohol Use: 0.0 oz/week    0 Standard drinks or equivalent per week    Family History Family History  Problem Relation Age of Onset  . Cancer Mother     Uterin  .  Heart attack Father   . Hypertension Sister     Surgical History Past Surgical History  Procedure Laterality Date  . Abdominal hernia repair    . Carotid endarterectomy Left Dec. 3, 2008     cea  . Hysteroscopy w/d&c N/A 07/30/2014    Procedure: DILATATION AND CURETTAGE /HYSTEROSCOPY;  Surgeon: Osborne Oman, MD;  Location: [facility];  Service: Gynecology;  Laterality: N/A;  . Iud removal N/A 07/30/2014    Procedure: INTRAUTERINE DEVICE (IUD) REMOVAL;  Surgeon: Osborne Oman, MD;  Location: [facility];  Service: Gynecology;  Laterality: N/A;  . Cataract extraction Left     Allergies  Allergen Reactions  . Avandia [Rosiglitazone] Other (See Comments)    Causes muscle weakness.    Current Outpatient Prescriptions  Medication Sig Dispense Refill  . acetaminophen (TYLENOL) 500 MG tablet Take 1,000 mg by mouth every 6 (six) hours as needed for mild pain (For back pain.).    Marland Kitchen atorvastatin (LIPITOR) 10 MG tablet TAKE 1 TABLET (10 MG TOTAL) BY MOUTH DAILY. 90 tablet 3  . Cholecalciferol (VITAMIN D) 1000 UNITS capsule Take 1 capsule (1,000 Units total) by mouth daily.    Marland Kitchen diltiazem (CARDIZEM CD) 180 MG 24 hr capsule TAKE 1 CAPSULE BY MOUTH TWICE A DAY BEFORE MEALS 60 capsule 5  . furosemide (LASIX) 40 MG tablet Take 40 mg by mouth daily.      Marland Kitchen  glimepiride (AMARYL) 1 MG tablet Take 1 mg by mouth daily with breakfast.   1  . KLOR-CON M20 20 MEQ tablet TAKE 1 TABLET BY MOUTH 3 DAYS A WEEK. 50 tablet 1  . lisinopril (PRINIVIL,ZESTRIL) 5 MG tablet Take 5 mg by mouth daily.     . metFORMIN (GLUCOPHAGE) 1000 MG tablet Take 1,000 mg by mouth 2 (two) times daily with a meal.    . ONE TOUCH ULTRA TEST test strip 1 each by Other route as needed.   5  . potassium chloride SA (K-DUR,KLOR-CON) 20 MEQ tablet Take 20 mEq by mouth 3 (three) times a week. Patient takes on Monday, Wednesday and Friday.    . Sodium Bicarbonate-Citric Acid (ALKA-SELTZER HEARTBURN PO) Take 1 each by mouth daily as needed (For  heartburn.).    Marland Kitchen warfarin (COUMADIN) 5 MG tablet Take as directed by coumadin clinic 90 tablet 0  . HYDROcodone-acetaminophen (NORCO/VICODIN) 5-325 MG tablet Take 1 tablet by mouth every 6 (six) hours as needed for moderate pain or severe pain. Reported on 11/18/2015     No current facility-administered medications for this visit.    Review of Systems : See HPI for pertinent positives and negatives.  Physical Examination  Filed Vitals:   11/18/15 1035 11/18/15 1038  BP: 129/62 122/73  Pulse: 70 67  Temp: 98 F (36.7 C)   Resp: 16   Height: 5' 8.5" (1.74 m)   Weight: 177 lb (80.287 kg)   SpO2: 98%    Body mass index is 26.52 kg/(m^2).  General: WDWN female in NAD GAIT: normal Eyes: PERRLA Pulmonary: Respirations are non-labored, limited air movement in all fields, no rales, rhonchi, or wheezing.  Cardiac: Irregular rhythm, no detected murmur.  VASCULAR EXAM Carotid Bruits Right Left   Negative Negative   Aorta is not palpable. Radial pulses are 2+ palpable and equal.      LE Pulses Right Left   FEMORAL 2+ palpable 2+ palpable   POPLITEAL not palpable  not palpable   POSTERIOR TIBIAL not palpable  not palpable    DORSALIS PEDIS  ANTERIOR TIBIAL 1+ palpable  1+ palpable     Gastrointestinal: soft, nontender, BS WNL, no r/g, no palpable masses. Musculoskeletal: Generalized mild muscle atrophy/wasting. M/S 5/5 throughout, extremities without ischemic changes.  Neurologic: A&O X 3; Appropriate Affect, Speech is normal CN 2-12 intact except mild hearing loss, is wearing hearing aids, Pain and light touch intact in extremities, Motor exam as listed above.                     Non-Invasive Vascular Imaging CAROTID DUPLEX 11/18/2015    CEREBROVASCULAR DUPLEX EVALUATION    INDICATION: Carotid artery disease    PREVIOUS INTERVENTION(S): Left carotid endarterectomy 05/08/2007    DUPLEX EXAM: Carotid duplex    RIGHT  LEFT  Peak Systolic Velocities (cm/s) End Diastolic Velocities (cm/s) Plaque LOCATION Peak Systolic Velocities (cm/s) End Diastolic Velocities (cm/s) Plaque  101 11 HT CCA PROXIMAL 86 16 HT  79 13 HT CCA MID 85 20 HT  118 15 HT CCA DISTAL 84 15   165 11 HT ECA 120 8 HT  284 64 CP ICA PROXIMAL 42 9 HT  109 18 HT ICA MID 80 19   101 21  ICA DISTAL 76 20     3.5 ICA / CCA Ratio (PSV) NA  Antegrade Vertebral Flow See notes  - Brachial Systolic Pressure (mmHg) -  Triphasic Brachial Artery Waveforms Triphasic  Plaque Morphology:  HM = Homogeneous, HT = Heterogeneous, CP = Calcific Plaque, SP = Smooth Plaque, IP = Irregular Plaque     ADDITIONAL FINDINGS: Multiphasic subclavian arteries. The left vertebral not visualize  mid segment but is antegrade  distally, history of possible dissection per CT 05/31/2006    IMPRESSION: 1. 60 - 79% right internal carotid artery stenosis. 2. Patent left carotid endarterectomy site with mild hyperplasia at the proximal patch    Compared to the previous exam:  No change since exam of 05/12/2015      Assessment: Cynthia Hansen is a 75 y.o. female who is status post left CEA in 2008. She has no history of stroke or TIA. Today's carotid duplex suggests 60-79% right ICA stenosis and a patent left carotid endarterectomy site with evidence of mild hyperplasia at the proximal patch.  No significant change in comparison to the last exam on 10/22/2014 and 05/12/2015.  She exercises regularly and has intentionally lost 66 pounds in 3 years. Her DM seems to be in control, but unfortunately she continues to smoke.   Plan: Follow-up in 6 months with Carotid Duplex scan.  The patient was counseled re smoking cessation and given several free resources re smoking  cessation.   I discussed in depth with the patient the nature of atherosclerosis, and emphasized the importance of maximal medical management including strict control of blood pressure, blood glucose, and lipid levels, obtaining regular exercise, and cessation of smoking.  The patient is aware that without maximal medical management the underlying atherosclerotic disease process will progress, limiting the benefit of any interventions. The patient was given information about stroke prevention and what symptoms should prompt the patient to seek immediate medical care. Thank you for allowing [place] to participate in this patient's care.  Clemon Chambers, RN, MSN, FNP-C [facility] Office: [phone]  Clinic Physician: Early  11/18/2015 10:52 AM

## 2015-11-23 ENCOUNTER — Ambulatory Visit (INDEPENDENT_AMBULATORY_CARE_PROVIDER_SITE_OTHER): Payer: PPO | Admitting: *Deleted

## 2015-11-23 DIAGNOSIS — I4891 Unspecified atrial fibrillation: Secondary | ICD-10-CM

## 2015-11-23 DIAGNOSIS — Z5181 Encounter for therapeutic drug level monitoring: Secondary | ICD-10-CM | POA: Diagnosis not present

## 2015-11-23 LAB — POCT INR: INR: 2.3

## 2015-12-20 ENCOUNTER — Telehealth: Payer: Self-pay | Admitting: Interventional Cardiology

## 2015-12-20 DIAGNOSIS — D509 Iron deficiency anemia, unspecified: Secondary | ICD-10-CM

## 2015-12-20 NOTE — Telephone Encounter (Signed)
Patient called to report that the last few times she has gone to the gym, her BP has been a little low. Today, her BP was 113/53 after working out, and she "didn't even work out too hard." She rode the stationary bike.  She also c/o intermittent fatigue and dizziness. She has no other symptoms or complaints. She is concerned she is taking to much medication or that her iron might be low again. Scheduled patient 7/19 in the [facility] to discuss medications and symptoms.  Patient would like to know if Dr. Irish Lack would like to check any additional labwork (iron) while she is in the office for Coumadin/BP check.   To Dr. Irish Lack.

## 2015-12-20 NOTE — Telephone Encounter (Signed)
New message     Pt is having PT/INR tomorrow.  She is tired and dizzy.  While here, she want to know if Dr Irish Lack will order blood work.  Please call

## 2015-12-21 NOTE — Telephone Encounter (Signed)
OK to check iron panel with blood work at visit. I would be more likely to decrease meds if she had systolics below [id number].

## 2015-12-22 ENCOUNTER — Ambulatory Visit (INDEPENDENT_AMBULATORY_CARE_PROVIDER_SITE_OTHER): Payer: PPO | Admitting: Pharmacist

## 2015-12-22 VITALS — BP 112/64 | HR 63 | Wt 179.2 lb

## 2015-12-22 DIAGNOSIS — I4891 Unspecified atrial fibrillation: Secondary | ICD-10-CM

## 2015-12-22 DIAGNOSIS — Z5181 Encounter for therapeutic drug level monitoring: Secondary | ICD-10-CM | POA: Diagnosis not present

## 2015-12-22 DIAGNOSIS — D509 Iron deficiency anemia, unspecified: Secondary | ICD-10-CM | POA: Diagnosis not present

## 2015-12-22 DIAGNOSIS — I10 Essential (primary) hypertension: Secondary | ICD-10-CM | POA: Diagnosis not present

## 2015-12-22 LAB — IRON,TIBC AND FERRITIN PANEL
%SAT: 16 % (ref 11–50)
FERRITIN: 15 ng/mL — AB (ref 20–288)
Iron: 61 ug/dL (ref 45–160)
TIBC: 393 ug/dL (ref 250–450)

## 2015-12-22 LAB — POCT INR: INR: 2.8

## 2015-12-22 NOTE — Telephone Encounter (Signed)
Iron panel linked to lab appointment.

## 2015-12-22 NOTE — Progress Notes (Signed)
Patient ID: Cynthia Hansen                 DOB: [birth year]                      MRN: [id number]     HPI: Cynthia Hansen is a 75 y.o. female referred by Dr. Irish Lack to HTN clinic. PMH is significant for atrial fibrillation, HTN, HLD, DM, and PVD. Pt's BP has historically been well controlled. She called complaining of dizziness and fatigue over the past few days as well as a few low BP readings after working out at [employer].   Pt reports that she felt dizzy and run down for about 5 consecutive days recently. Denies illness. Reports feeling better yesterday and today. Sx were worse upon standing. Reports that she has been staying well hydrated. Denies blurred vision or headaches. Is concerned about iron deficiency - reports that she has previously required IV iron infusions to correct anemia. Checking iron panel and CBC today per Dr. Irish Lack.  Brings in a log of BP readings from [facility] where she works out. Pre work out BP ranges 110-130s/60-70s. Post workout readings are little lower: 103-130/60-80s. Multiple post-work out systolic readings of [id number].   Current HTN meds: lisinopril 5mg  daily, diltiazem 180mg  BID, furosemide 40mg  daily BP goal: <140/76mmHg  Family History: Cancer in her mother, MI in her father, HTN in her sister.  Social History: Former smoker for 50 years - quit 10/06/15. Reports that she does not drink alcohol or use illicit drugs.   Wt Readings from Last 3 Encounters:  11/18/15 177 lb (80.287 kg)  10/27/15 181 lb 12.8 oz (82.464 kg)  05/12/15 177 lb 12.8 oz (80.65 kg)   BP Readings from Last 3 Encounters:  11/18/15 122/73  10/27/15 110/60  05/12/15 108/64   Pulse Readings from Last 3 Encounters:  11/18/15 67  10/27/15 55  05/12/15 67    Renal function: CrCl cannot be calculated (Unknown ideal weight.).  Past Medical History  Diagnosis Date  . Diabetes mellitus   . Hyperlipidemia   . A-fib ([place])   . Vitamin D deficiency   .  Carotid artery occlusion   . Irregular heart beat     Current Outpatient Prescriptions on File Prior to Visit  Medication Sig Dispense Refill  . acetaminophen (TYLENOL) 500 MG tablet Take 1,000 mg by mouth every 6 (six) hours as needed for mild pain (For back pain.).    Marland Kitchen atorvastatin (LIPITOR) 10 MG tablet TAKE 1 TABLET (10 MG TOTAL) BY MOUTH DAILY. 90 tablet 3  . Cholecalciferol (VITAMIN D) 1000 UNITS capsule Take 1 capsule (1,000 Units total) by mouth daily.    Marland Kitchen diltiazem (CARDIZEM CD) 180 MG 24 hr capsule TAKE 1 CAPSULE BY MOUTH TWICE A DAY BEFORE MEALS 60 capsule 5  . furosemide (LASIX) 40 MG tablet Take 40 mg by mouth daily.      Marland Kitchen glimepiride (AMARYL) 1 MG tablet Take 1 mg by mouth daily with breakfast.   1  . HYDROcodone-acetaminophen (NORCO/VICODIN) 5-325 MG tablet Take 1 tablet by mouth every 6 (six) hours as needed for moderate pain or severe pain. Reported on 11/18/2015    . KLOR-CON M20 20 MEQ tablet TAKE 1 TABLET BY MOUTH 3 DAYS A WEEK. 50 tablet 1  . lisinopril (PRINIVIL,ZESTRIL) 5 MG tablet Take 5 mg by mouth daily.     . metFORMIN (GLUCOPHAGE) 1000 MG tablet Take 1,000 mg  by mouth 2 (two) times daily with a meal.    . ONE TOUCH ULTRA TEST test strip 1 each by Other route as needed.   5  . potassium chloride SA (K-DUR,KLOR-CON) 20 MEQ tablet Take 20 mEq by mouth 3 (three) times a week. Patient takes on Monday, Wednesday and Friday.    . Sodium Bicarbonate-Citric Acid (ALKA-SELTZER HEARTBURN PO) Take 1 each by mouth daily as needed (For heartburn.).    Marland Kitchen warfarin (COUMADIN) 5 MG tablet Take as directed by coumadin clinic 90 tablet 0   No current facility-administered medications on file prior to visit.    Allergies  Allergen Reactions  . Avandia [Rosiglitazone] Other (See Comments)    Causes muscle weakness.     Assessment/Plan:  1. HTN - BP historically well controlled and at goal < 140/64mmHg. Post-workout BP has been dropping to systolic in the low [id number] and pt has  been more dizzy. Will reduce lisinopril to 2.5mg  daily. Advised pt to stay well hydrated and continue checking her BP at cardiac rehab. Can check BP at next Coumadin check in 6 weeks.   Briya Lookabaugh E. Robben Jagiello, PharmD, [place] [id number] [address], [place], [place] [address] Phone: [phone]; Fax: [phone] 12/22/2015 9:31 AM

## 2015-12-31 ENCOUNTER — Other Ambulatory Visit: Payer: Self-pay | Admitting: Interventional Cardiology

## 2016-01-03 ENCOUNTER — Other Ambulatory Visit: Payer: Self-pay | Admitting: Interventional Cardiology

## 2016-01-04 DIAGNOSIS — Z7984 Long term (current) use of oral hypoglycemic drugs: Secondary | ICD-10-CM | POA: Diagnosis not present

## 2016-01-04 DIAGNOSIS — I4891 Unspecified atrial fibrillation: Secondary | ICD-10-CM | POA: Diagnosis not present

## 2016-01-04 DIAGNOSIS — E119 Type 2 diabetes mellitus without complications: Secondary | ICD-10-CM | POA: Diagnosis not present

## 2016-01-04 DIAGNOSIS — R5383 Other fatigue: Secondary | ICD-10-CM | POA: Diagnosis not present

## 2016-01-05 DIAGNOSIS — E119 Type 2 diabetes mellitus without complications: Secondary | ICD-10-CM | POA: Diagnosis not present

## 2016-01-05 DIAGNOSIS — H26493 Other secondary cataract, bilateral: Secondary | ICD-10-CM | POA: Diagnosis not present

## 2016-01-05 DIAGNOSIS — Z961 Presence of intraocular lens: Secondary | ICD-10-CM | POA: Diagnosis not present

## 2016-01-05 DIAGNOSIS — H35032 Hypertensive retinopathy, left eye: Secondary | ICD-10-CM | POA: Diagnosis not present

## 2016-01-05 DIAGNOSIS — H35373 Puckering of macula, bilateral: Secondary | ICD-10-CM | POA: Diagnosis not present

## 2016-01-05 DIAGNOSIS — E538 Deficiency of other specified B group vitamins: Secondary | ICD-10-CM | POA: Diagnosis not present

## 2016-01-05 DIAGNOSIS — H35033 Hypertensive retinopathy, bilateral: Secondary | ICD-10-CM | POA: Diagnosis not present

## 2016-01-05 DIAGNOSIS — H35031 Hypertensive retinopathy, right eye: Secondary | ICD-10-CM | POA: Diagnosis not present

## 2016-01-11 ENCOUNTER — Other Ambulatory Visit: Payer: Self-pay | Admitting: Interventional Cardiology

## 2016-01-12 DIAGNOSIS — E538 Deficiency of other specified B group vitamins: Secondary | ICD-10-CM | POA: Diagnosis not present

## 2016-01-13 ENCOUNTER — Other Ambulatory Visit: Payer: Self-pay | Admitting: *Deleted

## 2016-01-13 DIAGNOSIS — I6523 Occlusion and stenosis of bilateral carotid arteries: Secondary | ICD-10-CM

## 2016-01-19 DIAGNOSIS — E538 Deficiency of other specified B group vitamins: Secondary | ICD-10-CM | POA: Diagnosis not present

## 2016-01-26 DIAGNOSIS — E538 Deficiency of other specified B group vitamins: Secondary | ICD-10-CM | POA: Diagnosis not present

## 2016-02-01 ENCOUNTER — Encounter (INDEPENDENT_AMBULATORY_CARE_PROVIDER_SITE_OTHER): Payer: Self-pay

## 2016-02-01 ENCOUNTER — Ambulatory Visit (INDEPENDENT_AMBULATORY_CARE_PROVIDER_SITE_OTHER): Payer: PPO | Admitting: *Deleted

## 2016-02-01 DIAGNOSIS — Z5181 Encounter for therapeutic drug level monitoring: Secondary | ICD-10-CM | POA: Diagnosis not present

## 2016-02-01 DIAGNOSIS — I4891 Unspecified atrial fibrillation: Secondary | ICD-10-CM

## 2016-02-01 LAB — POCT INR: INR: 3.6

## 2016-02-14 ENCOUNTER — Other Ambulatory Visit: Payer: PPO

## 2016-02-23 ENCOUNTER — Other Ambulatory Visit: Payer: PPO | Admitting: *Deleted

## 2016-02-23 ENCOUNTER — Ambulatory Visit (INDEPENDENT_AMBULATORY_CARE_PROVIDER_SITE_OTHER): Payer: PPO | Admitting: *Deleted

## 2016-02-23 DIAGNOSIS — E538 Deficiency of other specified B group vitamins: Secondary | ICD-10-CM | POA: Diagnosis not present

## 2016-02-23 DIAGNOSIS — I4891 Unspecified atrial fibrillation: Secondary | ICD-10-CM | POA: Diagnosis not present

## 2016-02-23 DIAGNOSIS — Z5181 Encounter for therapeutic drug level monitoring: Secondary | ICD-10-CM | POA: Diagnosis not present

## 2016-02-23 DIAGNOSIS — E785 Hyperlipidemia, unspecified: Secondary | ICD-10-CM | POA: Diagnosis not present

## 2016-02-23 LAB — COMPREHENSIVE METABOLIC PANEL
ALBUMIN: 4.1 g/dL (ref 3.6–5.1)
ALT: 7 U/L (ref 6–29)
AST: 10 U/L (ref 10–35)
Alkaline Phosphatase: 73 U/L (ref 33–130)
BUN: 25 mg/dL (ref 7–25)
CHLORIDE: 103 mmol/L (ref 98–110)
CO2: 23 mmol/L (ref 20–31)
Calcium: 9.2 mg/dL (ref 8.6–10.4)
Creat: 1.22 mg/dL — ABNORMAL HIGH (ref 0.60–0.93)
Glucose, Bld: 204 mg/dL — ABNORMAL HIGH (ref 65–99)
POTASSIUM: 4.5 mmol/L (ref 3.5–5.3)
Sodium: 137 mmol/L (ref 135–146)
TOTAL PROTEIN: 7.2 g/dL (ref 6.1–8.1)
Total Bilirubin: 0.2 mg/dL (ref 0.2–1.2)

## 2016-02-23 LAB — POCT INR: INR: 4.8

## 2016-02-23 LAB — LIPID PANEL
CHOL/HDL RATIO: 2.7 ratio (ref <=5.0)
CHOLESTEROL: 139 mg/dL (ref 125–200)
HDL: 51 mg/dL (ref >=46)
LDL Cholesterol: 64 mg/dL (ref <130)
TRIGLYCERIDES: 121 mg/dL (ref <150)
VLDL: 24 mg/dL (ref <30)

## 2016-03-08 ENCOUNTER — Ambulatory Visit (INDEPENDENT_AMBULATORY_CARE_PROVIDER_SITE_OTHER): Payer: PPO | Admitting: *Deleted

## 2016-03-08 DIAGNOSIS — Z5181 Encounter for therapeutic drug level monitoring: Secondary | ICD-10-CM | POA: Diagnosis not present

## 2016-03-08 DIAGNOSIS — I4891 Unspecified atrial fibrillation: Secondary | ICD-10-CM

## 2016-03-08 LAB — POCT INR: INR: 2.8

## 2016-03-23 DIAGNOSIS — E538 Deficiency of other specified B group vitamins: Secondary | ICD-10-CM | POA: Diagnosis not present

## 2016-04-04 ENCOUNTER — Encounter: Payer: Self-pay | Admitting: *Deleted

## 2016-04-04 ENCOUNTER — Ambulatory Visit (INDEPENDENT_AMBULATORY_CARE_PROVIDER_SITE_OTHER): Payer: PPO | Admitting: *Deleted

## 2016-04-04 DIAGNOSIS — Z5181 Encounter for therapeutic drug level monitoring: Secondary | ICD-10-CM | POA: Diagnosis not present

## 2016-04-04 DIAGNOSIS — I4891 Unspecified atrial fibrillation: Secondary | ICD-10-CM

## 2016-04-04 LAB — POCT INR: INR: 3.2

## 2016-04-25 DIAGNOSIS — D509 Iron deficiency anemia, unspecified: Secondary | ICD-10-CM | POA: Diagnosis not present

## 2016-05-09 ENCOUNTER — Ambulatory Visit (INDEPENDENT_AMBULATORY_CARE_PROVIDER_SITE_OTHER): Payer: PPO | Admitting: *Deleted

## 2016-05-09 DIAGNOSIS — Z5181 Encounter for therapeutic drug level monitoring: Secondary | ICD-10-CM | POA: Diagnosis not present

## 2016-05-09 DIAGNOSIS — I4891 Unspecified atrial fibrillation: Secondary | ICD-10-CM | POA: Diagnosis not present

## 2016-05-09 LAB — POCT INR: INR: 2.8

## 2016-05-25 ENCOUNTER — Ambulatory Visit: Payer: PPO | Admitting: Family

## 2016-05-25 ENCOUNTER — Encounter (HOSPITAL_COMMUNITY): Payer: PPO

## 2016-05-26 ENCOUNTER — Ambulatory Visit (INDEPENDENT_AMBULATORY_CARE_PROVIDER_SITE_OTHER): Payer: PPO | Admitting: Pharmacist

## 2016-05-26 DIAGNOSIS — Z5181 Encounter for therapeutic drug level monitoring: Secondary | ICD-10-CM | POA: Diagnosis not present

## 2016-05-26 DIAGNOSIS — I4891 Unspecified atrial fibrillation: Secondary | ICD-10-CM

## 2016-05-26 LAB — POCT INR: INR: 2.6

## 2016-05-27 DIAGNOSIS — J069 Acute upper respiratory infection, unspecified: Secondary | ICD-10-CM | POA: Diagnosis not present

## 2016-06-15 DIAGNOSIS — K529 Noninfective gastroenteritis and colitis, unspecified: Secondary | ICD-10-CM | POA: Diagnosis not present

## 2016-06-15 DIAGNOSIS — Z86010 Personal history of colonic polyps: Secondary | ICD-10-CM | POA: Diagnosis not present

## 2016-06-16 ENCOUNTER — Telehealth: Payer: Self-pay

## 2016-06-16 NOTE — Telephone Encounter (Signed)
Per protocol ok to hold 5 days prior to procedure for Afib with CHADS <4 and no history of stroke. Resume at usual dose and recheck INR 7-10 days post procedure.

## 2016-06-16 NOTE — Telephone Encounter (Signed)
**Note De-Identified  Obfuscation** The pt is scheduled to have a endoscopy on 08/02/16 with [facility] (Dr Watt Climes). They are requesting that the pt hold her Coumadin prior to the procedure. When should Coumadin be stopped?  Please advise.

## 2016-06-18 NOTE — Telephone Encounter (Signed)
OK 

## 2016-06-19 NOTE — Telephone Encounter (Signed)
**Note De-Identified  Obfuscation** This message has been faxed to Dr Watt Climes at [facility] [phone]). I did receive confirmation that the fax was successful.

## 2016-06-29 ENCOUNTER — Ambulatory Visit (INDEPENDENT_AMBULATORY_CARE_PROVIDER_SITE_OTHER): Payer: PPO | Admitting: Pharmacist

## 2016-06-29 DIAGNOSIS — Z5181 Encounter for therapeutic drug level monitoring: Secondary | ICD-10-CM | POA: Diagnosis not present

## 2016-06-29 DIAGNOSIS — I4891 Unspecified atrial fibrillation: Secondary | ICD-10-CM

## 2016-06-29 LAB — POCT INR: INR: 2.7

## 2016-07-05 ENCOUNTER — Other Ambulatory Visit: Payer: Self-pay | Admitting: *Deleted

## 2016-07-05 MED ORDER — POTASSIUM CHLORIDE CRYS ER 20 MEQ PO TBCR: EXTENDED_RELEASE_TABLET | ORAL | 0 refills | Status: DC

## 2016-07-07 ENCOUNTER — Encounter: Payer: Self-pay | Admitting: Family

## 2016-07-13 ENCOUNTER — Encounter: Payer: Self-pay | Admitting: Family

## 2016-07-13 ENCOUNTER — Ambulatory Visit (INDEPENDENT_AMBULATORY_CARE_PROVIDER_SITE_OTHER): Payer: PPO | Admitting: Family

## 2016-07-13 ENCOUNTER — Ambulatory Visit (HOSPITAL_COMMUNITY)
Admission: RE | Admit: 2016-07-13 | Discharge: 2016-07-13 | Disposition: A | Discharge status: Home / Self Care | Payer: PPO | Source: Ambulatory Visit | Attending: Family | Admitting: Family

## 2016-07-13 VITALS — BP 121/69 | HR 77 | Temp 98.9°F | Resp 16 | Ht 69.0 in | Wt 174.0 lb

## 2016-07-13 DIAGNOSIS — I6523 Occlusion and stenosis of bilateral carotid arteries: Secondary | ICD-10-CM | POA: Insufficient documentation

## 2016-07-13 DIAGNOSIS — Z9889 Other specified postprocedural states: Secondary | ICD-10-CM | POA: Diagnosis not present

## 2016-07-13 DIAGNOSIS — R0989 Other specified symptoms and signs involving the circulatory and respiratory systems: Secondary | ICD-10-CM

## 2016-07-13 DIAGNOSIS — F17200 Nicotine dependence, unspecified, uncomplicated: Secondary | ICD-10-CM | POA: Diagnosis not present

## 2016-07-13 NOTE — Patient Instructions (Addendum)
Stroke Prevention Some medical conditions and behaviors are associated with an increased chance of having a stroke. You may prevent a stroke by making healthy choices and managing medical conditions. How can I reduce my risk of having a stroke?  Stay physically active. Get at least 30 minutes of activity on most or all days.  Do not smoke. It may also be helpful to avoid exposure to secondhand smoke.  Limit alcohol use. Moderate alcohol use is considered to be:  No more than 2 drinks per day for men.  No more than 1 drink per day for nonpregnant women.  Eat healthy foods. This involves:  Eating 5 or more servings of fruits and vegetables a day.  Making dietary changes that address high blood pressure (hypertension), high cholesterol, diabetes, or obesity.  Manage your cholesterol levels.  Making food choices that are high in fiber and low in saturated fat, trans fat, and cholesterol may control cholesterol levels.  Take any prescribed medicines to control cholesterol as directed by your health care provider.  Manage your diabetes.  Controlling your carbohydrate and sugar intake is recommended to manage diabetes.  Take any prescribed medicines to control diabetes as directed by your health care provider.  Control your hypertension.  Making food choices that are low in salt (sodium), saturated fat, trans fat, and cholesterol is recommended to manage hypertension.  Ask your health care provider if you need treatment to lower your blood pressure. Take any prescribed medicines to control hypertension as directed by your health care provider.  If you are 18-39 years of age, have your blood pressure checked every 3-5 years. If you are 40 years of age or older, have your blood pressure checked every year.  Maintain a healthy weight.  Reducing calorie intake and making food choices that are low in sodium, saturated fat, trans fat, and cholesterol are recommended to manage  weight.  Stop drug abuse.  Avoid taking birth control pills.  Talk to your health care provider about the risks of taking birth control pills if you are over 35 years old, smoke, get migraines, or have ever had a blood clot.  Get evaluated for sleep disorders (sleep apnea).  Talk to your health care provider about getting a sleep evaluation if you snore a lot or have excessive sleepiness.  Take medicines only as directed by your health care provider.  For some people, aspirin or blood thinners (anticoagulants) are helpful in reducing the risk of forming abnormal blood clots that can lead to stroke. If you have the irregular heart rhythm of atrial fibrillation, you should be on a blood thinner unless there is a good reason you cannot take them.  Understand all your medicine instructions.  Make sure that other conditions (such as anemia or atherosclerosis) are addressed. Get help right away if:  You have sudden weakness or numbness of the face, arm, or leg, especially on one side of the body.  Your face or eyelid droops to one side.  You have sudden confusion.  You have trouble speaking (aphasia) or understanding.  You have sudden trouble seeing in one or both eyes.  You have sudden trouble walking.  You have dizziness.  You have a loss of balance or coordination.  You have a sudden, severe headache with no known cause.  You have new chest pain or an irregular heartbeat. Any of these symptoms may represent a serious problem that is an emergency. Do not wait to see if the symptoms will go away.   Get medical help at once. Call your local emergency services (911 in [place] Do not drive yourself to the hospital.  This information is not intended to replace advice given to you by your health care provider. Make sure you discuss any questions you have with your health care provider. Document Released: 06/29/2004 Document Revised: 10/28/2015 Document Reviewed: 11/22/2012 Elsevier  Interactive Patient Education  2017 Elsevier Inc.     Steps to Quit Smoking Smoking tobacco can be bad for your health. It can also affect almost every organ in your body. Smoking puts you and people around you at risk for many serious long-lasting (chronic) diseases. Quitting smoking is hard, but it is one of the best things that you can do for your health. It is never too late to quit. What are the benefits of quitting smoking? When you quit smoking, you lower your risk for getting serious diseases and conditions. They can include:  Lung cancer or lung disease.  Heart disease.  Stroke.  Heart attack.  Not being able to have children (infertility).  Weak bones (osteoporosis) and broken bones (fractures). If you have coughing, wheezing, and shortness of breath, those symptoms may get better when you quit. You may also get sick less often. If you are pregnant, quitting smoking can help to lower your chances of having a baby of low birth weight. What can I do to help me quit smoking? Talk with your doctor about what can help you quit smoking. Some things you can do (strategies) include:  Quitting smoking totally, instead of slowly cutting back how much you smoke over a period of time.  Going to in-person counseling. You are more likely to quit if you go to many counseling sessions.  Using resources and support systems, such as:  Online chats with a counselor.  Phone quitlines.  Printed self-help materials.  Support groups or group counseling.  Text messaging programs.  Mobile phone apps or applications.  Taking medicines. Some of these medicines may have nicotine in them. If you are pregnant or breastfeeding, do not take any medicines to quit smoking unless your doctor says it is okay. Talk with your doctor about counseling or other things that can help you. Talk with your doctor about using more than one strategy at the same time, such as taking medicines while you are  also going to in-person counseling. This can help make quitting easier. What things can I do to make it easier to quit? Quitting smoking might feel very hard at first, but there is a lot that you can do to make it easier. Take these steps:  Talk to your family and friends. Ask them to support and encourage you.  Call phone quitlines, reach out to support groups, or work with a counselor.  Ask people who smoke to not smoke around you.  Avoid places that make you want (trigger) to smoke, such as:  Bars.  Parties.  Smoke-break areas at work.  Spend time with people who do not smoke.  Lower the stress in your life. Stress can make you want to smoke. Try these things to help your stress:  Getting regular exercise.  Deep-breathing exercises.  Yoga.  Meditating.  Doing a body scan. To do this, close your eyes, focus on one area of your body at a time from head to toe, and notice which parts of your body are tense. Try to relax the muscles in those areas.  Download or buy apps on your mobile phone or tablet   that can help you stick to your quit plan. There are many free apps, such as QuitGuide from the CDC (Centers for Disease Control and Prevention). You can find more support from smokefree.gov and other websites. This information is not intended to replace advice given to you by your health care provider. Make sure you discuss any questions you have with your health care provider. Document Released: 03/18/2009 Document Revised: 01/18/2016 Document Reviewed: 10/06/2014 Elsevier Interactive Patient Education  2017 Elsevier Inc.  

## 2016-07-13 NOTE — Progress Notes (Signed)
Chief Complaint: Follow up Extracranial Carotid Artery Stenosis   History of Present Illness  Cynthia Hansen is a 76 y.o. female patient of Dr. Oneida Alar who is status post left CEA in 2008 by Dr. Amedeo Plenty and returns today for follow up. She has atrial fibrillation which is rate controlled. She is on warfarin for this.   The patient denies any history of TIA or stroke symptoms, specifically the patient denies a history of amaurosis fugax or monocular blindness, unilateral facial drooping, hemiplegia, or receptive or expressive aphasia.   Patient states she has a deteriorating disc in her lumbar spine. She denies claudication symptoms with walking, has low back pain and weakness in legs with climbing stairs, denies non healing wounds.  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 She was attending a fitness center 3 days/week, has stopped due to trying to avoid flu exposure. Her walking is limited by back issues.  She has intentionally lost 60 pounds in 3 years.  She had a D&C February 2016. Dr. Leighton Ruff is her PCP.  Pt Diabetic: Yes, pt states it is in control Pt smoker:  smoker (1/3 ppd, started smoking at age [age] yrs).   Pt meds include: Statin : Yes, changed to pravastatin from Crestor.  ASA: No Other anticoagulants/antiplatelets: Coumadin for atrial fib    Past Medical History:  Diagnosis Date  . A-fib ([place])   . Carotid artery occlusion   . Diabetes mellitus   . Hyperlipidemia   . Irregular heart beat   . Vitamin D deficiency     Social History Social History  Substance Use Topics  . Smoking status: Former Smoker    Years: 50.00    Types: Cigarettes    Quit date: 10/06/2015  . Smokeless tobacco: Never Used  . Alcohol use 0.0 oz/week    Family History Family History  Problem Relation Age of Onset  . Cancer Mother     Uterin  . Heart attack Father   . Hypertension Sister     Surgical History Past Surgical History:  Procedure  Laterality Date  . ABDOMINAL HERNIA REPAIR    . CAROTID ENDARTERECTOMY Left Dec. 3, 2008    cea  . CATARACT EXTRACTION Left   . HYSTEROSCOPY W/D&C N/A 07/30/2014   Procedure: DILATATION AND CURETTAGE /HYSTEROSCOPY;  Surgeon: Osborne Oman, MD;  Location: [facility];  Service: Gynecology;  Laterality: N/A;  . IUD REMOVAL N/A 07/30/2014   Procedure: INTRAUTERINE DEVICE (IUD) REMOVAL;  Surgeon: Osborne Oman, MD;  Location: [facility];  Service: Gynecology;  Laterality: N/A;    Allergies  Allergen Reactions  . Avandia [Rosiglitazone] Other (See Comments)    Causes muscle weakness.    Current Outpatient Prescriptions  Medication Sig Dispense Refill  . acetaminophen (TYLENOL) 500 MG tablet Take 1,000 mg by mouth every 6 (six) hours as needed for mild pain (For back pain.).    Marland Kitchen atorvastatin (LIPITOR) 10 MG tablet TAKE 1 TABLET (10 MG TOTAL) BY MOUTH DAILY. 90 tablet 3  . Cholecalciferol (VITAMIN D) 1000 UNITS capsule Take 1 capsule (1,000 Units total) by mouth daily.    Marland Kitchen diltiazem (CARDIZEM CD) 180 MG 24 hr capsule TAKE 1 CAPSULE BY MOUTH TWICE A DAY BEFORE MEALS 180 capsule 2  . furosemide (LASIX) 40 MG tablet Take 40 mg by mouth daily.      Marland Kitchen glimepiride (AMARYL) 1 MG tablet Take 1 mg by mouth daily with breakfast.   1  . HYDROcodone-acetaminophen (NORCO/VICODIN) 5-325  MG tablet Take 1 tablet by mouth every 6 (six) hours as needed for moderate pain or severe pain. Reported on 11/18/2015    . lisinopril (PRINIVIL,ZESTRIL) 5 MG tablet Take 2.5 mg by mouth daily.    . metFORMIN (GLUCOPHAGE) 1000 MG tablet Take 1,000 mg by mouth 2 (two) times daily with a meal.    . ONE TOUCH ULTRA TEST test strip 1 each by Other route as needed.   5  . potassium chloride SA (KLOR-CON M20) 20 MEQ tablet TAKE 1 TABLET BY MOUTH 3 DAYS A WEEK. 36 tablet 0  . Sodium Bicarbonate-Citric Acid (ALKA-SELTZER HEARTBURN PO) Take 1 each by mouth daily as needed (For heartburn.).    Marland Kitchen warfarin (COUMADIN) 5 MG tablet TAKE AS  DIRECTED BY COUMADIN CLINIC (3 MONTH SUPPLY) 60 tablet 0   No current facility-administered medications for this visit.     Review of Systems : See HPI for pertinent positives and negatives.  Physical Examination  Vitals:   07/13/16 0901 07/13/16 0903  BP: 120/67 121/69  Pulse: 77 77  Resp: 16   Temp: 98.9 F (37.2 C)   SpO2: 99%   Weight: 174 lb (78.9 kg)   Height: 5\' 9"  (1.753 m)    Body mass index is 25.7 kg/m.  General: WDWN female in NAD GAIT: normal Eyes: PERRLA Pulmonary: Respirations are non-labored, adequate air movement in all fields, no rales, rhonchi, or wheezing.  Cardiac: Irregular rhythm, no detected murmur.  VASCULAR EXAM Carotid Bruits Right Left   Negative Negative   Aorta is not palpable. Radial pulses are 2+ palpable and equal.      LE Pulses Right Left   FEMORAL 2+ palpable 2+ palpable   POPLITEAL not palpable  not palpable   POSTERIOR TIBIAL 1+ palpable  not palpable    DORSALIS PEDIS  ANTERIOR TIBIAL not palpable  not palpable     Gastrointestinal: soft, nontender, BS WNL, no r/g, no palpable masses. Musculoskeletal: Generalized mild muscle atrophy/wasting. M/S 4/5 throughout, extremities without ischemic changes.  Neurologic: A&O X 3; Appropriate Affect, Speech is normal CN 2-12 intact except mild hearing loss, is wearing hearing aids, Pain and light touch intact in extremities, Motor exam as listed above.      Assessment: Cynthia Hansen is a 76 y.o. female  who is status post left CEA in 2008. She has no history of stroke or TIA.  She was exercising regularly and has intentionally lost 66 pounds in 3-4 years. Her DM seems to be in control, but unfortunately she continues to smoke.  She has back issues  which limit her walking, does not seem to have claudication, but pedal pulses are not present except right PT, see Plan.   DATA Today's carotid duplex suggests 60-79% right ICA stenosis and a patent left carotid endarterectomy site with evidence of mild hyperplasia at the proximal patch.  Right vertebral artery flow is antegrade, left is not visualized.  Bilateral subclavian artery waveforms are normal.   No significant change in comparison to the exams on 10/22/2014, 05/12/2015, and 11-18-15.   Plan: Follow-up in 6 months with Carotid Duplex scan and ABI's The patient was counseled re smoking cessation and given several free resources re smoking cessation.   I discussed in depth with the patient the nature of atherosclerosis, and emphasized the importance of maximal medical management including strict control of blood pressure, blood glucose, and lipid levels, obtaining regular exercise, and cessation of smoking.  The patient is aware that without  maximal medical management the underlying atherosclerotic disease process will progress, limiting the benefit of any interventions. The patient was given information about stroke prevention and what symptoms should prompt the patient to seek immediate medical care. Thank you for allowing [place] to participate in this patient's care.  Clemon Chambers, RN, MSN, FNP-C [facility] Office: [phone]  Clinic Physician: Bridgett Larsson on call  07/13/16 9:10 AM

## 2016-07-14 ENCOUNTER — Other Ambulatory Visit: Payer: Self-pay | Admitting: Interventional Cardiology

## 2016-07-14 NOTE — Addendum Note (Signed)
Addended by: Lianne Cure A on: 07/14/2016 04:41 PM   Modules accepted: Orders

## 2016-08-02 DIAGNOSIS — D126 Benign neoplasm of colon, unspecified: Secondary | ICD-10-CM | POA: Diagnosis not present

## 2016-08-02 DIAGNOSIS — K5730 Diverticulosis of large intestine without perforation or abscess without bleeding: Secondary | ICD-10-CM | POA: Diagnosis not present

## 2016-08-02 DIAGNOSIS — R197 Diarrhea, unspecified: Secondary | ICD-10-CM | POA: Diagnosis not present

## 2016-08-02 DIAGNOSIS — Z86010 Personal history of colonic polyps: Secondary | ICD-10-CM | POA: Diagnosis not present

## 2016-08-08 ENCOUNTER — Other Ambulatory Visit: Payer: Self-pay | Admitting: *Deleted

## 2016-08-08 DIAGNOSIS — D126 Benign neoplasm of colon, unspecified: Secondary | ICD-10-CM | POA: Diagnosis not present

## 2016-08-08 MED ORDER — WARFARIN SODIUM 5 MG PO TABS: ORAL_TABLET | ORAL | 0 refills | Status: DC

## 2016-08-10 ENCOUNTER — Ambulatory Visit (INDEPENDENT_AMBULATORY_CARE_PROVIDER_SITE_OTHER): Payer: PPO | Admitting: Interventional Cardiology

## 2016-08-10 ENCOUNTER — Ambulatory Visit (INDEPENDENT_AMBULATORY_CARE_PROVIDER_SITE_OTHER): Payer: PPO | Admitting: *Deleted

## 2016-08-10 ENCOUNTER — Encounter: Payer: Self-pay | Admitting: Interventional Cardiology

## 2016-08-10 VITALS — BP 124/66 | HR 64 | Wt 176.2 lb

## 2016-08-10 DIAGNOSIS — Z5181 Encounter for therapeutic drug level monitoring: Secondary | ICD-10-CM

## 2016-08-10 DIAGNOSIS — I779 Disorder of arteries and arterioles, unspecified: Secondary | ICD-10-CM

## 2016-08-10 DIAGNOSIS — E1159 Type 2 diabetes mellitus with other circulatory complications: Secondary | ICD-10-CM

## 2016-08-10 DIAGNOSIS — R0789 Other chest pain: Secondary | ICD-10-CM | POA: Diagnosis not present

## 2016-08-10 DIAGNOSIS — I739 Peripheral vascular disease, unspecified: Secondary | ICD-10-CM

## 2016-08-10 DIAGNOSIS — F17200 Nicotine dependence, unspecified, uncomplicated: Secondary | ICD-10-CM | POA: Diagnosis not present

## 2016-08-10 DIAGNOSIS — I482 Chronic atrial fibrillation, unspecified: Secondary | ICD-10-CM

## 2016-08-10 DIAGNOSIS — I4891 Unspecified atrial fibrillation: Secondary | ICD-10-CM | POA: Diagnosis not present

## 2016-08-10 LAB — BASIC METABOLIC PANEL
BUN/Creatinine Ratio: 22 (ref 12–28)
BUN: 27 mg/dL (ref 8–27)
CALCIUM: 10 mg/dL (ref 8.7–10.3)
CHLORIDE: 98 mmol/L (ref 96–106)
CO2: 21 mmol/L (ref 18–29)
Creatinine, Ser: 1.21 mg/dL — ABNORMAL HIGH (ref 0.57–1.00)
GFR calc non Af Amer: 44 mL/min/{1.73_m2} — ABNORMAL LOW (ref >59)
GFR, EST AFRICAN AMERICAN: 51 mL/min/{1.73_m2} — AB (ref >59)
Glucose: 75 mg/dL (ref 65–99)
Potassium: 4.9 mmol/L (ref 3.5–5.2)
Sodium: 137 mmol/L (ref 134–144)

## 2016-08-10 LAB — POCT INR: INR: 2.5

## 2016-08-10 LAB — TROPONIN T: Troponin T TROPT: <0.011 ng/mL (ref <0.011)

## 2016-08-10 NOTE — Patient Instructions (Signed)
Medication Instructions:  Your physician recommends that you continue on your current medications as directed. Please refer to the Current Medication list given to you today.   Labwork: LABS TODAY:  BMET, STAT TROPONIN  We will call you with your results.  Testing/Procedures: None ordered  Follow-Up: Keep Appointment in May.  Any Other Special Instructions Will Be Listed Below (If Applicable).     If you need a refill on your cardiac medications before your next appointment, please call your pharmacy.

## 2016-08-10 NOTE — Progress Notes (Signed)
Cardiology Office Note    Date:  08/10/2016   ID:  ANNER BAITY, DOB 1940/11/30, MRN [id number]  PCP:  Gerrit Heck, MD  Cardiologist:   Larae Grooms, MD   No chief complaint on file. Follow-up atrial fibrillation  History of Present Illness:  Cynthia Hansen is a 76 y.o. female who presents due to chest pain.  She has a history of atrial fibrillation on warfarin. She underwent cardioversion back in 2007. This lasted for about a year but she reverted back to atrial fibrillation. She has been rate controlled since that time. She also has hypertension, hyperlipidemia, DM, smoker(1/2 ppd), and PVD with 50-69% RICA and patent Left carotid endarterectomy 10/22/14 followed by Dr. Oneida Alar.   She had lost 50 pounds over a 2 year period and maintained the wieght loss. She was exercising 3 days a week at [facility] cardiac rehabilitation and watching her diet. Recent lipid check in September 2016 showed well-controlled lipids on atorvastatin.  Exercise has decreased of late due to her husband having lung cancer.   She continued to smoke half a pack of cigarettes daily until 10/06/2015 when she quit.  She restarted smoking with the stress of her husbands illness.   She woke up this morning at about 3 AM with severe heartburn. She had eaten pizza for dinner last night. She took some Alka-Seltzer with some mild relief. It was better with sitting up. She was concerned because her father had heartburn, went to bed and never woke up. She came for a Coumadin check and mentioned her symptoms. She had some mild dizziness as well. She has not passed out or fallen. No bleeding problems. INR was checked and was 2.5. We'll see her today daily due to this chest discomfort.  She now reports that after belching several times, there is some improvement in her symptoms.      Past Medical History:  Diagnosis Date  . A-fib ([place])   . Carotid artery occlusion   . Diabetes mellitus   .  Hyperlipidemia   . Irregular heart beat   . Vitamin D deficiency     Past Surgical History:  Procedure Laterality Date  . ABDOMINAL HERNIA REPAIR    . CAROTID ENDARTERECTOMY Left Dec. 3, 2008    cea  . CATARACT EXTRACTION Left   . HYSTEROSCOPY W/D&C N/A 07/30/2014   Procedure: DILATATION AND CURETTAGE /HYSTEROSCOPY;  Surgeon: Osborne Oman, MD;  Location: [facility];  Service: Gynecology;  Laterality: N/A;  . IUD REMOVAL N/A 07/30/2014   Procedure: INTRAUTERINE DEVICE (IUD) REMOVAL;  Surgeon: Osborne Oman, MD;  Location: [facility];  Service: Gynecology;  Laterality: N/A;    Current Medications: Outpatient Medications Prior to Visit  Medication Sig Dispense Refill  . acetaminophen (TYLENOL) 500 MG tablet Take 1,000 mg by mouth every 6 (six) hours as needed for mild pain (For back pain.).    Marland Kitchen atorvastatin (LIPITOR) 10 MG tablet TAKE 1 TABLET BY MOUTH ONCE DAILY 90 tablet 1  . Cholecalciferol (VITAMIN D) 1000 UNITS capsule Take 1 capsule (1,000 Units total) by mouth daily.    Marland Kitchen diltiazem (CARDIZEM CD) 180 MG 24 hr capsule TAKE 1 CAPSULE BY MOUTH TWICE A DAY BEFORE MEALS 180 capsule 2  . furosemide (LASIX) 40 MG tablet Take 40 mg by mouth daily.      Marland Kitchen glimepiride (AMARYL) 1 MG tablet Take 1 mg by mouth daily with breakfast.   1  . HYDROcodone-acetaminophen (NORCO/VICODIN) 5-325 MG tablet Take 1  tablet by mouth every 6 (six) hours as needed for moderate pain or severe pain. Reported on 11/18/2015    . lisinopril (PRINIVIL,ZESTRIL) 5 MG tablet Take 2.5 mg by mouth daily.    . metFORMIN (GLUCOPHAGE) 1000 MG tablet Take 1,000 mg by mouth 2 (two) times daily with a meal.    . ONE TOUCH ULTRA TEST test strip 1 each by Other route as needed.   5  . potassium chloride SA (KLOR-CON M20) 20 MEQ tablet TAKE 1 TABLET BY MOUTH 3 DAYS A WEEK. 36 tablet 0  . Sodium Bicarbonate-Citric Acid (ALKA-SELTZER HEARTBURN PO) Take 1 each by mouth daily as needed (For heartburn.).    Marland Kitchen warfarin (COUMADIN) 5 MG  tablet TAKE AS DIRECTED BY COUMADIN CLINIC (3 MONTH SUPPLY) 60 tablet 0   No facility-administered medications prior to visit.      Allergies:   Avandia [rosiglitazone]   Social History   Social History  . Marital status: Married    Spouse name: N/A  . Number of children: N/A  . Years of education: N/A   Social History Main Topics  . Smoking status: Former Smoker    Years: 50.00    Types: Cigarettes    Quit date: 10/06/2015  . Smokeless tobacco: Never Used  . Alcohol use 0.0 oz/week  . Drug use: No  . Sexual activity: Not Asked   Other Topics Concern  . None   Social History Narrative  . None     Family History:  The patient's family history includes Cancer in her mother; Heart attack in her father; Hypertension in her sister.   ROS:   Please see the history of present illness.    ROS All other systems reviewed and are negative.   PHYSICAL EXAM:   VS:  BP 124/66   Pulse 64   Wt 176 lb 3.2 oz (79.9 kg)   BMI 26.02 kg/m    GEN: Well nourished, well developed, in no acute distress  HEENT: normal  Neck: no JVD, carotid bruits, or masses Cardiac: Irregularly irregular; no murmurs, rubs, or gallops,no edema  Respiratory:  clear to auscultation bilaterally, normal work of breathing GI: soft, nontender, nondistended, + BS MS: no deformity or atrophy  Skin: warm and dry, no rash Neuro:  Alert and Oriented x 3, Strength and sensation are intact Psych: euthymic mood, full affect  Wt Readings from Last 3 Encounters:  08/10/16 176 lb 3.2 oz (79.9 kg)  07/13/16 174 lb (78.9 kg)  12/22/15 179 lb 3.2 oz (81.3 kg)      Studies/Labs Reviewed:   EKG:  EKG is  ordered today.  The ekg ordered today demonstrates Atrial fibrillation, rate controlled.  No change compared to May 2017 ECG.  Recent Labs: 02/23/2016: ALT 7; BUN 25; Creat 1.22; Potassium 4.5; Sodium 137   Lipid Panel    Component Value Date/Time   CHOL 139 02/23/2016 0749   TRIG 121 02/23/2016 0749   HDL 51  02/23/2016 0749   CHOLHDL 2.7 02/23/2016 0749   VLDL 24 02/23/2016 0749   LDLCALC 64 02/23/2016 0749    Additional studies/ records that were reviewed today include:  2007 echo: normal LV function    ASSESSMENT:    1. Chest discomfort   2. Carotid artery disease, unspecified laterality ([place])   3. Chronic atrial fibrillation ([place])   4. Type 2 diabetes mellitus with other circulatory complication, unspecified long term insulin use status ([place])      PLAN:  1. Chest discomfort.  Sounds more GI related. Improved. She can try over-the-counter acid reducing medicine. Will check a troponin to ensure there is nothing going on acutely.  I suspect troponin will be negative but if it is abnormal, explained to her that we would have to send her to the hospital.  Symptoms have been going on for 6 hours at this point, but are significantly better. Mild dizziness on occasion. Blood pressure check shows that she is not orthostatic today. She needs to stop smoking. 2. Atrial fibrillation: Rate controlled. Coumadin for stroke prevention.    Check echo if sx return or if there is a change in exercise tolerance. No palpitations at this time.   3. HTN: Well controlled. Continue current medicines 4. Carotid disease: Followed by the vascular surgeons. S/p CEA. 5. Hyperlipidemia: Continue atorvastatin. After this episode is completed, continue regular exercise program trying to get 150 minutes a week of aerobic exercise. 6. Diabetes: Managed by primary care physician. Continue healthy lifestyle.  Try to find ways to deal with stress.      Medication Adjustments/Labs and Tests Ordered: Current medicines are reviewed at length with the patient today.  Concerns regarding medicines are outlined above.  Medication changes, Labs and Tests ordered today are listed in the Patient Instructions below. Patient Instructions  Medication Instructions:  Your physician recommends that you continue on your current  medications as directed. Please refer to the Current Medication list given to you today.   Labwork: LABS TODAY:  BMET, STAT TROPONIN  We will call you with your results.  Testing/Procedures: None ordered  Follow-Up: Keep Appointment in May.  Any Other Special Instructions Will Be Listed Below (If Applicable).     If you need a refill on your cardiac medications before your next appointment, please call your pharmacy.     Signed, Larae Grooms, MD  08/10/2016 10:35 AM    [facility] [place], [place], [place]  [address] Phone: [phone]; Fax: [phone]

## 2016-08-18 DIAGNOSIS — M542 Cervicalgia: Secondary | ICD-10-CM | POA: Diagnosis not present

## 2016-08-18 DIAGNOSIS — M545 Low back pain: Secondary | ICD-10-CM | POA: Diagnosis not present

## 2016-08-18 DIAGNOSIS — M9903 Segmental and somatic dysfunction of lumbar region: Secondary | ICD-10-CM | POA: Diagnosis not present

## 2016-08-18 DIAGNOSIS — M5431 Sciatica, right side: Secondary | ICD-10-CM | POA: Diagnosis not present

## 2016-08-18 DIAGNOSIS — M9901 Segmental and somatic dysfunction of cervical region: Secondary | ICD-10-CM | POA: Diagnosis not present

## 2016-08-22 DIAGNOSIS — E538 Deficiency of other specified B group vitamins: Secondary | ICD-10-CM | POA: Diagnosis not present

## 2016-08-22 DIAGNOSIS — E782 Mixed hyperlipidemia: Secondary | ICD-10-CM | POA: Diagnosis not present

## 2016-08-22 DIAGNOSIS — Z720 Tobacco use: Secondary | ICD-10-CM | POA: Diagnosis not present

## 2016-08-22 DIAGNOSIS — N183 Chronic kidney disease, stage 3 (moderate): Secondary | ICD-10-CM | POA: Diagnosis not present

## 2016-08-22 DIAGNOSIS — I4891 Unspecified atrial fibrillation: Secondary | ICD-10-CM | POA: Diagnosis not present

## 2016-08-22 DIAGNOSIS — Z7901 Long term (current) use of anticoagulants: Secondary | ICD-10-CM | POA: Diagnosis not present

## 2016-08-22 DIAGNOSIS — D509 Iron deficiency anemia, unspecified: Secondary | ICD-10-CM | POA: Diagnosis not present

## 2016-08-22 DIAGNOSIS — I10 Essential (primary) hypertension: Secondary | ICD-10-CM | POA: Diagnosis not present

## 2016-08-22 DIAGNOSIS — Z7984 Long term (current) use of oral hypoglycemic drugs: Secondary | ICD-10-CM | POA: Diagnosis not present

## 2016-08-22 DIAGNOSIS — E559 Vitamin D deficiency, unspecified: Secondary | ICD-10-CM | POA: Diagnosis not present

## 2016-08-22 DIAGNOSIS — E119 Type 2 diabetes mellitus without complications: Secondary | ICD-10-CM | POA: Diagnosis not present

## 2016-09-11 DIAGNOSIS — K529 Noninfective gastroenteritis and colitis, unspecified: Secondary | ICD-10-CM | POA: Diagnosis not present

## 2016-09-20 ENCOUNTER — Ambulatory Visit (INDEPENDENT_AMBULATORY_CARE_PROVIDER_SITE_OTHER): Payer: PPO | Admitting: *Deleted

## 2016-09-20 DIAGNOSIS — I482 Chronic atrial fibrillation, unspecified: Secondary | ICD-10-CM

## 2016-09-20 DIAGNOSIS — I4891 Unspecified atrial fibrillation: Secondary | ICD-10-CM

## 2016-09-20 DIAGNOSIS — Z5181 Encounter for therapeutic drug level monitoring: Secondary | ICD-10-CM | POA: Diagnosis not present

## 2016-09-20 LAB — POCT INR: INR: 3.4

## 2016-09-23 ENCOUNTER — Other Ambulatory Visit: Payer: Self-pay | Admitting: Interventional Cardiology

## 2016-10-10 ENCOUNTER — Encounter: Payer: Self-pay | Admitting: Interventional Cardiology

## 2016-10-11 DIAGNOSIS — H2000 Unspecified acute and subacute iridocyclitis: Secondary | ICD-10-CM | POA: Diagnosis not present

## 2016-10-22 NOTE — Progress Notes (Signed)
Cardiology Office Note    Date:  10/26/2016   ID:  Cynthia, Hansen [birth year], MRN [id number]  PCP:  Leighton Ruff, MD  Cardiologist:   Larae Grooms, MD   Chief Complaint  Patient presents with  . Follow-up  Follow-up atrial fibrillation  History of Present Illness:  Cynthia Hansen is a 76 y.o. female who has a history of atrial fibrillation on warfarin. She underwent cardioversion back in 2007. This lasted for about a year but she reverted back to atrial fibrillation. She has been rate controlled since that time. She also has hypertension, hyperlipidemia, DM, smoker(1/2 ppd), and PVD with 50-69% RICA and patent Left carotid endarterectomy 10/22/14 followed by Dr. Oneida Alar.   She had lost 50 pounds over a 2 year period and maintained the wieght loss. She was exercising 3 days a week at [facility] cardiac rehabilitation and watching her diet.   Dropped to 1-2 x/week.   She continued to smoke half a pack of cigarettes daily until 10/06/2015 when she quit.  She restarted smoking with the stress of her husbands illness.   She had some chest discomfort in 3/18.  She had  anegative troponin and it resolved with belching.  Since the last visit, she was diagnosed with ulcerative colitis.  THis has improved with the medications started, mesalamine and colestipol.  No blood in her stool.  SHe feels much better.    No further chest pain.     Exercise has decreased of late due to her husband having lung cancer.  He is getting chemotherapy/immunotherapy.  She is hoping to exercise more if her husband feels better.       Past Medical History:  Diagnosis Date  . A-fib ([place])   . Carotid artery occlusion   . Diabetes mellitus   . Hyperlipidemia   . Irregular heart beat   . Vitamin D deficiency     Past Surgical History:  Procedure Laterality Date  . ABDOMINAL HERNIA REPAIR    . CAROTID ENDARTERECTOMY Left Dec. 3, 2008    cea  . CATARACT EXTRACTION Left   .  HYSTEROSCOPY W/D&C N/A 07/30/2014   Procedure: DILATATION AND CURETTAGE /HYSTEROSCOPY;  Surgeon: Osborne Oman, MD;  Location: [facility];  Service: Gynecology;  Laterality: N/A;  . IUD REMOVAL N/A 07/30/2014   Procedure: INTRAUTERINE DEVICE (IUD) REMOVAL;  Surgeon: Osborne Oman, MD;  Location: [facility];  Service: Gynecology;  Laterality: N/A;    Current Medications: Outpatient Medications Prior to Visit  Medication Sig Dispense Refill  . acetaminophen (TYLENOL) 500 MG tablet Take 1,000 mg by mouth every 6 (six) hours as needed for mild pain (For back pain.).    Marland Kitchen atorvastatin (LIPITOR) 10 MG tablet TAKE 1 TABLET BY MOUTH ONCE DAILY 90 tablet 1  . Cholecalciferol (VITAMIN D) 1000 UNITS capsule Take 1 capsule (1,000 Units total) by mouth daily.    Marland Kitchen diltiazem (CARDIZEM CD) 180 MG 24 hr capsule TAKE 1 CAPSULE BY MOUTH TWICE A DAY BEFORE MEALS 180 capsule 2  . furosemide (LASIX) 40 MG tablet Take 40 mg by mouth daily.      Marland Kitchen glimepiride (AMARYL) 1 MG tablet Take 1 mg by mouth daily with breakfast.   1  . HYDROcodone-acetaminophen (NORCO/VICODIN) 5-325 MG tablet Take 1 tablet by mouth every 6 (six) hours as needed for moderate pain or severe pain. Reported on 11/18/2015    . lisinopril (PRINIVIL,ZESTRIL) 5 MG tablet Take 2.5 mg by mouth daily.    Marland Kitchen  metFORMIN (GLUCOPHAGE) 1000 MG tablet Take 1,000 mg by mouth 2 (two) times daily with a meal.    . ONE TOUCH ULTRA TEST test strip 1 each by Other route as needed (for BG testing).   5  . potassium chloride SA (K-DUR,KLOR-CON) 20 MEQ tablet TAKE 1 TABLET BY MOUTH 3 DAYS A WEEK 36 tablet 3  . Sodium Bicarbonate-Citric Acid (ALKA-SELTZER HEARTBURN PO) Take 1 each by mouth daily as needed (For heartburn.).    Marland Kitchen warfarin (COUMADIN) 5 MG tablet TAKE AS DIRECTED BY COUMADIN CLINIC (3 MONTH SUPPLY) 60 tablet 0   No facility-administered medications prior to visit.      Allergies:   Avandia [rosiglitazone]   Social History   Social History  . Marital  status: Married    Spouse name: N/A  . Number of children: N/A  . Years of education: N/A   Social History Main Topics  . Smoking status: Former Smoker    Years: 50.00    Types: Cigarettes    Quit date: 10/06/2015  . Smokeless tobacco: Never Used  . Alcohol use 0.0 oz/week  . Drug use: No  . Sexual activity: Not Asked   Other Topics Concern  . None   Social History Narrative  . None     Family History:  The patient's family history includes Cancer in her mother; Heart attack in her father; Hypertension in her sister.   ROS:   PRecent diarrhea, improved ROS All other systems reviewed and are negative.   PHYSICAL EXAM:   VS:  BP 106/60   Pulse 70   Ht 5\' 9"  (1.753 m)   Wt 178 lb (80.7 kg)   BMI 26.29 kg/m    GEN: Well nourished, well developed, in no acute distress  HEENT: normal  Neck: no JVD, carotid bruits, or masses Cardiac: irregularly irregular; no murmurs, rubs, or gallops,no edema  Respiratory:  clear to auscultation bilaterally, normal work of breathing GI: soft, nontender, nondistended,  MS: no deformity or atrophy  Skin: warm and dry, no rash; hand bruising Neuro:  Strength and sensation are intact Psych: euthymic mood, full affect   Wt Readings from Last 3 Encounters:  10/26/16 178 lb (80.7 kg)  08/10/16 176 lb 3.2 oz (79.9 kg)  07/13/16 174 lb (78.9 kg)      Studies/Labs Reviewed:   EKG:  EKG is  ordered today.  The ekg ordered today demonstrates Atrial fibrillation, rate controlled.  No change compared to May 2017 ECG.  Recent Labs: 02/23/2016: ALT 7 08/10/2016: BUN 27; Creatinine, Ser 1.21; Potassium 4.9; Sodium 137   Lipid Panel    Component Value Date/Time   CHOL 139 02/23/2016 0749   TRIG 121 02/23/2016 0749   HDL 51 02/23/2016 0749   CHOLHDL 2.7 02/23/2016 0749   VLDL 24 02/23/2016 0749   LDLCALC 64 02/23/2016 0749    Additional studies/ records that were reviewed today include:  2007 echo: normal LV function    ASSESSMENT:      1. Chronic atrial fibrillation ([facility])   2. Carotid artery disease, unspecified laterality ([place])   3. Tobacco use disorder   4. Mixed hyperlipidemia   5. Hypertensive heart disease without heart failure      PLAN:     1. Atrial fibrillation: Rate controlled. Coumadin for stroke prevention.  No palpitations. Only mild bruising noted. No GI bleeding with U.C. Diagnosis.  2. Hypertensive heart disease: Well controlled. Continue current medicines.  If BP drops, would stop lisinopril. 3. Carotid  disease: Followed by the vascular surgeons. S/p CEA. Stable.  4. Hyperlipidemia: Continue atorvastatin. Continue regular exercise program trying to get 150 minutes a week of aerobic exercise.  Recheck lipids in September 2018. 5. Diabetes: Managed by primary care physician. Continue healthy lifestyle.  Try to find ways to deal with stress.   6. Tobacco abuse:  She needs to stop smoking. 7. OK to continue meds for ulcerative colitis.  Watch for GI bleeding.     Medication Adjustments/Labs and Tests Ordered: Current medicines are reviewed at length with the patient today.  Concerns regarding medicines are outlined above.  Medication changes, Labs and Tests ordered today are listed in the Patient Instructions below. Patient Instructions  Medication Instructions:  Your physician recommends that you continue on your current medications as directed. Please refer to the Current Medication list given to you today.   Labwork: Your physician recommends that you return for lab work in: September for CMET and FASTING LIPIDS   Testing/Procedures: None ordered  Follow-Up: Your physician wants you to follow-up in: 1 year with Dr. Irish Lack. You will receive a reminder letter in the mail two months in advance. If you don't receive a letter, please call our office to schedule the follow-up appointment.   Any Other Special Instructions Will Be Listed Below (If Applicable).     If you need a refill on  your cardiac medications before your next appointment, please call your pharmacy.      Signed, Larae Grooms, MD  10/26/2016 8:31 AM    [facility] [place], [place], [place]  [address] Phone: [phone]; Fax: [phone]

## 2016-10-26 ENCOUNTER — Encounter (INDEPENDENT_AMBULATORY_CARE_PROVIDER_SITE_OTHER): Payer: Self-pay

## 2016-10-26 ENCOUNTER — Ambulatory Visit (INDEPENDENT_AMBULATORY_CARE_PROVIDER_SITE_OTHER): Payer: PPO | Admitting: *Deleted

## 2016-10-26 ENCOUNTER — Ambulatory Visit (INDEPENDENT_AMBULATORY_CARE_PROVIDER_SITE_OTHER): Payer: PPO | Admitting: Interventional Cardiology

## 2016-10-26 ENCOUNTER — Encounter: Payer: Self-pay | Admitting: Interventional Cardiology

## 2016-10-26 VITALS — BP 106/60 | HR 70 | Ht 69.0 in | Wt 178.0 lb

## 2016-10-26 DIAGNOSIS — I482 Chronic atrial fibrillation, unspecified: Secondary | ICD-10-CM

## 2016-10-26 DIAGNOSIS — Z5181 Encounter for therapeutic drug level monitoring: Secondary | ICD-10-CM | POA: Diagnosis not present

## 2016-10-26 DIAGNOSIS — E782 Mixed hyperlipidemia: Secondary | ICD-10-CM | POA: Diagnosis not present

## 2016-10-26 DIAGNOSIS — I119 Hypertensive heart disease without heart failure: Secondary | ICD-10-CM

## 2016-10-26 DIAGNOSIS — I739 Peripheral vascular disease, unspecified: Secondary | ICD-10-CM

## 2016-10-26 DIAGNOSIS — F17200 Nicotine dependence, unspecified, uncomplicated: Secondary | ICD-10-CM

## 2016-10-26 DIAGNOSIS — I779 Disorder of arteries and arterioles, unspecified: Secondary | ICD-10-CM

## 2016-10-26 DIAGNOSIS — I4891 Unspecified atrial fibrillation: Secondary | ICD-10-CM

## 2016-10-26 LAB — POCT INR: INR: 2.6

## 2016-10-26 NOTE — Patient Instructions (Addendum)
Medication Instructions:  Your physician recommends that you continue on your current medications as directed. Please refer to the Current Medication list given to you today.   Labwork: Your physician recommends that you return for lab work on: February 26, 2017 for CMET and FASTING LIPIDS   Testing/Procedures: None ordered  Follow-Up: Your physician wants you to follow-up in: 1 year with Dr. Irish Lack. You will receive a reminder letter in the mail two months in advance. If you don't receive a letter, please call our office to schedule the follow-up appointment.   Any Other Special Instructions Will Be Listed Below (If Applicable).     If you need a refill on your cardiac medications before your next appointment, please call your pharmacy.

## 2016-11-02 ENCOUNTER — Other Ambulatory Visit: Payer: Self-pay | Admitting: Interventional Cardiology

## 2016-11-02 DIAGNOSIS — I4891 Unspecified atrial fibrillation: Secondary | ICD-10-CM | POA: Diagnosis not present

## 2016-11-02 DIAGNOSIS — J209 Acute bronchitis, unspecified: Secondary | ICD-10-CM | POA: Diagnosis not present

## 2016-11-03 ENCOUNTER — Other Ambulatory Visit: Payer: Self-pay | Admitting: *Deleted

## 2016-11-03 MED ORDER — DILTIAZEM HCL ER COATED BEADS 180 MG PO CP24: ORAL_CAPSULE | ORAL | 3 refills | Status: DC

## 2016-11-07 ENCOUNTER — Ambulatory Visit (INDEPENDENT_AMBULATORY_CARE_PROVIDER_SITE_OTHER): Payer: PPO | Admitting: *Deleted

## 2016-11-07 DIAGNOSIS — I482 Chronic atrial fibrillation, unspecified: Secondary | ICD-10-CM

## 2016-11-07 DIAGNOSIS — I4891 Unspecified atrial fibrillation: Secondary | ICD-10-CM | POA: Diagnosis not present

## 2016-11-07 DIAGNOSIS — Z5181 Encounter for therapeutic drug level monitoring: Secondary | ICD-10-CM

## 2016-11-07 LAB — POCT INR: INR: 3.6

## 2016-11-15 ENCOUNTER — Ambulatory Visit (INDEPENDENT_AMBULATORY_CARE_PROVIDER_SITE_OTHER): Payer: PPO | Admitting: *Deleted

## 2016-11-15 DIAGNOSIS — Z5181 Encounter for therapeutic drug level monitoring: Secondary | ICD-10-CM

## 2016-11-15 DIAGNOSIS — I4891 Unspecified atrial fibrillation: Secondary | ICD-10-CM

## 2016-11-15 DIAGNOSIS — I482 Chronic atrial fibrillation, unspecified: Secondary | ICD-10-CM

## 2016-11-15 LAB — POCT INR: INR: 4.2

## 2016-11-23 ENCOUNTER — Ambulatory Visit (INDEPENDENT_AMBULATORY_CARE_PROVIDER_SITE_OTHER): Payer: PPO | Admitting: *Deleted

## 2016-11-23 DIAGNOSIS — I482 Chronic atrial fibrillation, unspecified: Secondary | ICD-10-CM

## 2016-11-23 DIAGNOSIS — I4891 Unspecified atrial fibrillation: Secondary | ICD-10-CM

## 2016-11-23 DIAGNOSIS — Z5181 Encounter for therapeutic drug level monitoring: Secondary | ICD-10-CM

## 2016-11-23 LAB — POCT INR: INR: 2.5

## 2016-12-01 DIAGNOSIS — M9903 Segmental and somatic dysfunction of lumbar region: Secondary | ICD-10-CM | POA: Diagnosis not present

## 2016-12-01 DIAGNOSIS — M545 Low back pain: Secondary | ICD-10-CM | POA: Diagnosis not present

## 2016-12-01 DIAGNOSIS — M531 Cervicobrachial syndrome: Secondary | ICD-10-CM | POA: Diagnosis not present

## 2016-12-01 DIAGNOSIS — M9901 Segmental and somatic dysfunction of cervical region: Secondary | ICD-10-CM | POA: Diagnosis not present

## 2016-12-15 ENCOUNTER — Ambulatory Visit (INDEPENDENT_AMBULATORY_CARE_PROVIDER_SITE_OTHER): Payer: PPO | Admitting: *Deleted

## 2016-12-15 DIAGNOSIS — I482 Chronic atrial fibrillation, unspecified: Secondary | ICD-10-CM

## 2016-12-15 DIAGNOSIS — I4891 Unspecified atrial fibrillation: Secondary | ICD-10-CM | POA: Diagnosis not present

## 2016-12-15 DIAGNOSIS — Z5181 Encounter for therapeutic drug level monitoring: Secondary | ICD-10-CM | POA: Diagnosis not present

## 2016-12-15 LAB — POCT INR: INR: 2.5

## 2016-12-26 ENCOUNTER — Encounter: Payer: Self-pay | Admitting: Family

## 2017-01-10 ENCOUNTER — Ambulatory Visit (INDEPENDENT_AMBULATORY_CARE_PROVIDER_SITE_OTHER): Payer: PPO | Admitting: *Deleted

## 2017-01-10 DIAGNOSIS — I482 Chronic atrial fibrillation, unspecified: Secondary | ICD-10-CM

## 2017-01-10 DIAGNOSIS — Z5181 Encounter for therapeutic drug level monitoring: Secondary | ICD-10-CM

## 2017-01-10 DIAGNOSIS — I4891 Unspecified atrial fibrillation: Secondary | ICD-10-CM

## 2017-01-10 LAB — POCT INR: INR: 2.9

## 2017-01-11 ENCOUNTER — Ambulatory Visit (INDEPENDENT_AMBULATORY_CARE_PROVIDER_SITE_OTHER)
Admission: RE | Admit: 2017-01-11 | Discharge: 2017-01-11 | Disposition: A | Discharge status: Home / Self Care | Payer: PPO | Source: Ambulatory Visit | Attending: Vascular Surgery | Admitting: Vascular Surgery

## 2017-01-11 ENCOUNTER — Encounter: Payer: Self-pay | Admitting: Family

## 2017-01-11 ENCOUNTER — Ambulatory Visit (HOSPITAL_COMMUNITY)
Admission: RE | Admit: 2017-01-11 | Discharge: 2017-01-11 | Disposition: A | Discharge status: Home / Self Care | Payer: PPO | Source: Ambulatory Visit | Attending: Vascular Surgery | Admitting: Vascular Surgery

## 2017-01-11 ENCOUNTER — Ambulatory Visit (INDEPENDENT_AMBULATORY_CARE_PROVIDER_SITE_OTHER): Payer: PPO | Admitting: Family

## 2017-01-11 VITALS — BP 124/80 | HR 90 | Temp 97.9°F | Resp 14 | Ht 69.0 in | Wt 177.0 lb

## 2017-01-11 DIAGNOSIS — Z9889 Other specified postprocedural states: Secondary | ICD-10-CM | POA: Diagnosis not present

## 2017-01-11 DIAGNOSIS — R9439 Abnormal result of other cardiovascular function study: Secondary | ICD-10-CM | POA: Insufficient documentation

## 2017-01-11 DIAGNOSIS — I739 Peripheral vascular disease, unspecified: Secondary | ICD-10-CM | POA: Diagnosis not present

## 2017-01-11 DIAGNOSIS — I6523 Occlusion and stenosis of bilateral carotid arteries: Secondary | ICD-10-CM | POA: Diagnosis not present

## 2017-01-11 DIAGNOSIS — F17200 Nicotine dependence, unspecified, uncomplicated: Secondary | ICD-10-CM

## 2017-01-11 DIAGNOSIS — I6521 Occlusion and stenosis of right carotid artery: Secondary | ICD-10-CM | POA: Diagnosis not present

## 2017-01-11 DIAGNOSIS — R0989 Other specified symptoms and signs involving the circulatory and respiratory systems: Secondary | ICD-10-CM

## 2017-01-11 LAB — VAS US CAROTID
LCCADDIAS: -25 cm/s
LEFT ECA DIAS: -14 cm/s
LEFT VERTEBRAL DIAS: 0 cm/s
LICADDIAS: -31 cm/s
LICAPSYS: -66 cm/s
Left CCA dist sys: -67 cm/s
Left CCA prox dias: 21 cm/s
Left CCA prox sys: 66 cm/s
Left ICA dist sys: -86 cm/s
Left ICA prox dias: -20 cm/s
RIGHT CCA MID DIAS: -11 cm/s
RIGHT ECA DIAS: -8 cm/s
RIGHT VERTEBRAL DIAS: -11 cm/s
Right CCA prox dias: 12 cm/s
Right CCA prox sys: 53 cm/s
Right cca dist sys: -89 cm/s

## 2017-01-11 NOTE — Patient Instructions (Addendum)
Steps to Quit Smoking Smoking tobacco can be bad for your health. It can also affect almost every organ in your body. Smoking puts you and people around you at risk for many serious long-lasting (chronic) diseases. Quitting smoking is hard, but it is one of the best things that you can do for your health. It is never too late to quit. What are the benefits of quitting smoking? When you quit smoking, you lower your risk for getting serious diseases and conditions. They can include:  Lung cancer or lung disease.  Heart disease.  Stroke.  Heart attack.  Not being able to have children (infertility).  Weak bones (osteoporosis) and broken bones (fractures).  If you have coughing, wheezing, and shortness of breath, those symptoms may get better when you quit. You may also get sick less often. If you are pregnant, quitting smoking can help to lower your chances of having a baby of low birth weight. What can I do to help me quit smoking? Talk with your doctor about what can help you quit smoking. Some things you can do (strategies) include:  Quitting smoking totally, instead of slowly cutting back how much you smoke over a period of time.  Going to in-person counseling. You are more likely to quit if you go to many counseling sessions.  Using resources and support systems, such as: ? Online chats with a counselor. ? Phone quitlines. ? Printed self-help materials. ? Support groups or group counseling. ? Text messaging programs. ? Mobile phone apps or applications.  Taking medicines. Some of these medicines may have nicotine in them. If you are pregnant or breastfeeding, do not take any medicines to quit smoking unless your doctor says it is okay. Talk with your doctor about counseling or other things that can help you.  Talk with your doctor about using more than one strategy at the same time, such as taking medicines while you are also going to in-person counseling. This can help make  quitting easier. What things can I do to make it easier to quit? Quitting smoking might feel very hard at first, but there is a lot that you can do to make it easier. Take these steps:  Talk to your family and friends. Ask them to support and encourage you.  Call phone quitlines, reach out to support groups, or work with a counselor.  Ask people who smoke to not smoke around you.  Avoid places that make you want (trigger) to smoke, such as: ? Bars. ? Parties. ? Smoke-break areas at work.  Spend time with people who do not smoke.  Lower the stress in your life. Stress can make you want to smoke. Try these things to help your stress: ? Getting regular exercise. ? Deep-breathing exercises. ? Yoga. ? Meditating. ? Doing a body scan. To do this, close your eyes, focus on one area of your body at a time from head to toe, and notice which parts of your body are tense. Try to relax the muscles in those areas.  Download or buy apps on your mobile phone or tablet that can help you stick to your quit plan. There are many free apps, such as QuitGuide from the CDC (Centers for Disease Control and Prevention). You can find more support from smokefree.gov and other websites.  This information is not intended to replace advice given to you by your health care provider. Make sure you discuss any questions you have with your health care provider. Document Released: 03/18/2009 Document   Revised: 01/18/2016 Document Reviewed: 10/06/2014 Elsevier Interactive Patient Education  2018 Elsevier Inc.    Stroke Prevention Some medical conditions and behaviors are associated with an increased chance of having a stroke. You may prevent a stroke by making healthy choices and managing medical conditions. How can I reduce my risk of having a stroke?  Stay physically active. Get at least 30 minutes of activity on most or all days.  Do not smoke. It may also be helpful to avoid exposure to secondhand  smoke.  Limit alcohol use. Moderate alcohol use is considered to be: ? No more than 2 drinks per day for men. ? No more than 1 drink per day for nonpregnant women.  Eat healthy foods. This involves: ? Eating 5 or more servings of fruits and vegetables a day. ? Making dietary changes that address high blood pressure (hypertension), high cholesterol, diabetes, or obesity.  Manage your cholesterol levels. ? Making food choices that are high in fiber and low in saturated fat, trans fat, and cholesterol may control cholesterol levels. ? Take any prescribed medicines to control cholesterol as directed by your health care provider.  Manage your diabetes. ? Controlling your carbohydrate and sugar intake is recommended to manage diabetes. ? Take any prescribed medicines to control diabetes as directed by your health care provider.  Control your hypertension. ? Making food choices that are low in salt (sodium), saturated fat, trans fat, and cholesterol is recommended to manage hypertension. ? Ask your health care provider if you need treatment to lower your blood pressure. Take any prescribed medicines to control hypertension as directed by your health care provider. ? If you are 18-39 years of age, have your blood pressure checked every 3-5 years. If you are 40 years of age or older, have your blood pressure checked every year.  Maintain a healthy weight. ? Reducing calorie intake and making food choices that are low in sodium, saturated fat, trans fat, and cholesterol are recommended to manage weight.  Stop drug abuse.  Avoid taking birth control pills. ? Talk to your health care provider about the risks of taking birth control pills if you are over 35 years old, smoke, get migraines, or have ever had a blood clot.  Get evaluated for sleep disorders (sleep apnea). ? Talk to your health care provider about getting a sleep evaluation if you snore a lot or have excessive sleepiness.  Take  medicines only as directed by your health care provider. ? For some people, aspirin or blood thinners (anticoagulants) are helpful in reducing the risk of forming abnormal blood clots that can lead to stroke. If you have the irregular heart rhythm of atrial fibrillation, you should be on a blood thinner unless there is a good reason you cannot take them. ? Understand all your medicine instructions.  Make sure that other conditions (such as anemia or atherosclerosis) are addressed. Get help right away if:  You have sudden weakness or numbness of the face, arm, or leg, especially on one side of the body.  Your face or eyelid droops to one side.  You have sudden confusion.  You have trouble speaking (aphasia) or understanding.  You have sudden trouble seeing in one or both eyes.  You have sudden trouble walking.  You have dizziness.  You have a loss of balance or coordination.  You have a sudden, severe headache with no known cause.  You have new chest pain or an irregular heartbeat. Any of these symptoms may represent a   serious problem that is an emergency. Do not wait to see if the symptoms will go away. Get medical help at once. Call your local emergency services (911 in [place] Do not drive yourself to the hospital. This information is not intended to replace advice given to you by your health care provider. Make sure you discuss any questions you have with your health care provider. Document Released: 06/29/2004 Document Revised: 10/28/2015 Document Reviewed: 11/22/2012 Elsevier Interactive Patient Education  2017 Elsevier Inc.     Preventing Cerebrovascular Disease Arteries are blood vessels that carry blood that contains oxygen from the heart to all parts of the body. Cerebrovascular disease affects arteries that supply the brain. Any condition that blocks or disrupts blood flow to the brain can cause cerebrovascular disease. Brain cells that lose blood supply start to die  within minutes (stroke). Stroke is the main danger of cerebrovascular disease. Atherosclerosis and high blood pressure are common causes of cerebrovascular disease. Atherosclerosis is narrowing and hardening of an artery that results when fat, cholesterol, calcium, or other substances (plaque) build up inside an artery. Plaque reduces blood flow through the artery. High blood pressure increases the risk of bleeding inside the brain. Making diet and lifestyle changes to prevent atherosclerosis and high blood pressure lowers your risk of cerebrovascular disease. What nutrition changes can be made?  Eat more fruits, vegetables, and whole grains.  Reduce how much saturated fat you eat. To do this, eat less red meat and fewer full-fat dairy products.  Eat healthy proteins instead of red meat. Healthy proteins include: ? Fish. Eat fish that contains heart-healthy omega-3 fatty acids, twice a week. Examples include salmon, albacore tuna, mackerel, and herring. ? Chicken. ? Nuts. ? Low-fat or nonfat yogurt.  Avoid processed meats, like bacon and lunchmeat.  Avoid foods that contain: ? A lot of sugar, such as sweets and drinks with added sugar. ? A lot of salt (sodium). Avoid adding extra salt to your food, as told by your health care provider. ? Trans fats, such as margarine and baked goods. Trans fats may be listed as "partially hydrogenated oils" on food labels.  Check food labels to see how much sodium, sugar, and trans fats are in foods.  Use vegetable oils that contain low amounts of saturated fat, such as olive oil or canola oil. What lifestyle changes can be made?  Drink alcohol in moderation. This means no more than 1 drink a day for nonpregnant women and 2 drinks a day for men. One drink equals 12 oz of beer, 5 oz of wine, or 1 oz of hard liquor.  If you are overweight, ask your health care provider to recommend a weight-loss plan for you. Losing 5-10 lb (2.2-4.5 kg) can reduce your  risk of diabetes, atherosclerosis, and high blood pressure.  Exercise for 30?60 minutes on most days, or as much as told by your health care provider. ? Do moderate-intensity exercise, such as brisk walking, bicycling, and water aerobics. Ask your health care provider which activities are safe for you.  Do not use any products that contain nicotine or tobacco, such as cigarettes and e-cigarettes. If you need help quitting, ask your health care provider. Why are these changes important? Making these changes lowers your risk of many diseases that can cause cerebrovascular disease and stroke. Stroke is a leading cause of death and disability. Making these changes also improves your overall health and quality of life. What can I do to lower my risk? The following factors   make you more likely to develop cerebrovascular disease:  Being overweight.  Smoking.  Being physically inactive.  Eating a high-fat diet.  Having certain health conditions, such as: ? Diabetes. ? High blood pressure. ? Heart disease. ? Atherosclerosis. ? High cholesterol. ? Sickle cell disease.  Talk with your health care provider about your risk for cerebrovascular disease. Work with your health care provider to control diseases that you have that may contribute to cerebrovascular disease. Your health care provider may prescribe medicines to help prevent major causes of cerebrovascular disease. Where to find more information: Learn more about preventing cerebrovascular disease from:  National Heart, Lung, and Blood Institute: www.nhlbi.nih.gov/health/health-topics/topics/stroke  Centers for Disease Control and Prevention: cdc.gov/stroke/about.htm  Summary  Cerebrovascular disease can lead to a stroke.  Atherosclerosis and high blood pressure are major causes of cerebrovascular disease.  Making diet and lifestyle changes can reduce your risk of cerebrovascular disease.  Work with your health care provider to  get your risk factors under control to reduce your risk of cerebrovascular disease. This information is not intended to replace advice given to you by your health care provider. Make sure you discuss any questions you have with your health care provider. Document Released: 06/06/2015 Document Revised: 12/10/2015 Document Reviewed: 06/06/2015 Elsevier Interactive Patient Education  2018 Elsevier Inc.  

## 2017-01-11 NOTE — Progress Notes (Signed)
Chief Complaint: Follow up Extracranial Carotid Artery Stenosis   History of Present Illness  Cynthia Hansen is a 76 y.o. female patient of Dr. Oneida Alar who is status post left CEA in 2008 by Dr. Amedeo Plenty and returns today for follow up.  The patient denies any history of TIA or stroke symptoms, specifically the patient denies a history of amaurosis fugax or monocular blindness, unilateral facial drooping, hemiplegia, or receptive or expressive aphasia.   Patient states she has a deteriorating disc in her lumbar spine. She denies claudication symptoms with walking, has low back pain and weakness in legs with climbing stairs, denies non healing wounds.  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 She is attending a fitness center 3 days/week. Her walking is limited by back issues.  She has intentionally lost 60 pounds in 3+ years. She reports advancing abnormal sensation, like compression hose are on lower legs when they are not, over the last 10 years from her feet up to her knees.  She had a D&C February 2016. Dr. Leighton Ruff is her PCP.  She has ulcerative colitis, her only sx was diarrhea, now managed by prn medication. Her husband is undergoing treatments for non small cell lung cancer; pt states she cannot think about stopping smoking now.    Pt Diabetic: Yes, pt states it is in control Pt smoker:  smoker (1/2 ppd, started smoking at age 70-30 yrs).   Pt meds include: Statin : Yes, changed to pravastatin from Crestor. She states muscle weakness in her legs since starting a statin. ASA: No Other anticoagulants/antiplatelets: Coumadin for atrial fib     Past Medical History:  Diagnosis Date  . A-fib ([place])   . Carotid artery occlusion   . Diabetes mellitus   . Hyperlipidemia   . Irregular heart beat   . Vitamin D deficiency     Social History Social History  Substance Use Topics  . Smoking status: Former Smoker    Years: 50.00    Types: Cigarettes     Quit date: 10/06/2015  . Smokeless tobacco: Never Used  . Alcohol use 0.0 oz/week    Family History Family History  Problem Relation Age of Onset  . Cancer Mother        Uterin  . Heart attack Father   . Hypertension Sister     Surgical History Past Surgical History:  Procedure Laterality Date  . ABDOMINAL HERNIA REPAIR    . CAROTID ENDARTERECTOMY Left Dec. 3, 2008    cea  . CATARACT EXTRACTION Left   . HYSTEROSCOPY W/D&C N/A 07/30/2014   Procedure: DILATATION AND CURETTAGE /HYSTEROSCOPY;  Surgeon: Osborne Oman, MD;  Location: [facility];  Service: Gynecology;  Laterality: N/A;  . IUD REMOVAL N/A 07/30/2014   Procedure: INTRAUTERINE DEVICE (IUD) REMOVAL;  Surgeon: Osborne Oman, MD;  Location: [facility];  Service: Gynecology;  Laterality: N/A;    Allergies  Allergen Reactions  . Avandia [Rosiglitazone] Other (See Comments)    Causes muscle weakness.    Current Outpatient Prescriptions  Medication Sig Dispense Refill  . acetaminophen (TYLENOL) 500 MG tablet Take 1,000 mg by mouth every 6 (six) hours as needed for mild pain (For back pain.).    Marland Kitchen atorvastatin (LIPITOR) 10 MG tablet TAKE 1 TABLET BY MOUTH ONCE DAILY 90 tablet 1  . Cholecalciferol (VITAMIN D) 1000 UNITS capsule Take 1 capsule (1,000 Units total) by mouth daily.    . colestipol (COLESTID) 1 g tablet Take 1 g  by mouth 2 (two) times daily.    Marland Kitchen diltiazem (CARDIZEM CD) 180 MG 24 hr capsule TAKE 1 CAPSULE BY MOUTH TWICE A DAY BEFORE MEALS 180 capsule 3  . furosemide (LASIX) 40 MG tablet Take 40 mg by mouth daily.      Marland Kitchen glimepiride (AMARYL) 1 MG tablet Take 1 mg by mouth daily with breakfast.   1  . HYDROcodone-acetaminophen (NORCO/VICODIN) 5-325 MG tablet Take 1 tablet by mouth every 6 (six) hours as needed for moderate pain or severe pain. Reported on 11/18/2015    . lisinopril (PRINIVIL,ZESTRIL) 5 MG tablet Take 2.5 mg by mouth daily.    . mesalamine (LIALDA) 1.2 g EC tablet Take 4.8 g by mouth daily with  breakfast.    . metFORMIN (GLUCOPHAGE) 1000 MG tablet Take 1,000 mg by mouth 2 (two) times daily with a meal.    . ONE TOUCH ULTRA TEST test strip 1 each by Other route as needed (for BG testing).   5  . potassium chloride SA (K-DUR,KLOR-CON) 20 MEQ tablet TAKE 1 TABLET BY MOUTH 3 DAYS A WEEK 36 tablet 3  . Sodium Bicarbonate-Citric Acid (ALKA-SELTZER HEARTBURN PO) Take 1 each by mouth daily as needed (For heartburn.).    Marland Kitchen warfarin (COUMADIN) 5 MG tablet TAKE AS DIRECTED BY COUMADIN CLINIC 60 tablet 1  . doxycycline (VIBRA-TABS) 100 MG tablet TK 1 T PO Q 12 H FOR 10 DAYS  0   No current facility-administered medications for this visit.     Review of Systems : See HPI for pertinent positives and negatives.  Physical Examination  Vitals:   01/11/17 1115 01/11/17 1130  BP: 123/67 124/80  Pulse: 91 90  Resp: 14   Temp: 97.9 F (36.6 C)   SpO2: 97%   Weight: 177 lb (80.3 kg)   Height: 5\' 9"  (1.753 m)    Body mass index is 26.14 kg/m.  General: WDWN female in NAD GAIT:normal Eyes: PERRLA Pulmonary: Respirations are non-labored, adequate air movement in all fields, no rales,rhonchi, or wheezing.  Cardiac: Irregular rhythm, controlled rate, no detected murmur.  VASCULAR EXAM Carotid Bruits Right Left   Negative Negative    Abdominal aortic pulse is not palpable. Radial pulses are 2+ palpable and equal.      LE Pulses Right Left   FEMORAL 2+ palpable 2+ palpable   POPLITEAL not palpable  not palpable   POSTERIOR TIBIAL 1+ palpable  not palpable    DORSALIS PEDIS  ANTERIOR TIBIAL not palpable  not palpable     Gastrointestinal: soft, nontender, BS WNL, no r/g, no palpable masses.  Musculoskeletal: Generalized mild muscle atrophy/wasting. M/S 4/5  throughout, extremities without ischemic changes.  Neurologic: A&O X 3; appropriate affect, speech is normal, CN 2-12 intact except mild hearing loss, is wearing hearing aids, Pain and light touch intact in extremities, Motor exam as listed above.    Assessment: Cynthia Hansen is a 76 y.o. female whois status post left CEA in 2008. She has no history of stroke or TIA.  She is exercising regularly and has intentionally lost 66 pounds in 3-4 years. Her DM seems to be in control, but unfortunately she continues to smoke.  She has back issues which limit her walking, does not seem to have claudication, but pedal pulses are not present except right PT, see Plan.  A right lobe thyroid nodule noted on today's carotid duplex; I advised pt to discuss this with her PCP to monitor.    DATA Today's  carotid duplex suggests 60-79% right ICA stenosis. Patent left carotid endarterectomy site with evidence of mild hyperplasia at the proximal patch.  Right vertebral artery flow is antegrade, left is not visualized.  Bilateral subclavian artery waveforms are normal.  Structure on right lobe of thyroid measures 1.2 cm x 1.1 cm x 1.3 cm. No significant change in comparison to the exams on 10-22-14, 05-12-15, 11-18-15, and 07-13-16.    Plan: Follow-up in 68monthswith Carotid Duplex. The patient was counseled re smoking cessation and given several free resources re smoking cessation   I discussed in depth with the patient the nature of atherosclerosis, and emphasized the importance of maximal medical management including strict control of blood pressure, blood glucose, and lipid levels, obtaining regular exercise, and cessation of smoking.  The patient is aware that without maximal medical management the underlying atherosclerotic disease process will progress, limiting the benefit of any interventions. The patient was given information about stroke prevention and what symptoms should prompt the  patient to seek immediate medical care. Thank you for allowing [place] to participate in this patient's care.  Clemon Chambers, RN, MSN, FNP-C [facility] Office: [phone]  Clinic Physician: Oneida Alar  01/11/17 11:53 AM

## 2017-01-18 DIAGNOSIS — H35033 Hypertensive retinopathy, bilateral: Secondary | ICD-10-CM | POA: Diagnosis not present

## 2017-01-18 DIAGNOSIS — H26491 Other secondary cataract, right eye: Secondary | ICD-10-CM | POA: Diagnosis not present

## 2017-01-18 DIAGNOSIS — H35373 Puckering of macula, bilateral: Secondary | ICD-10-CM | POA: Diagnosis not present

## 2017-01-18 DIAGNOSIS — H35362 Drusen (degenerative) of macula, left eye: Secondary | ICD-10-CM | POA: Diagnosis not present

## 2017-01-18 DIAGNOSIS — E119 Type 2 diabetes mellitus without complications: Secondary | ICD-10-CM | POA: Diagnosis not present

## 2017-01-19 NOTE — Addendum Note (Signed)
Addended by: Lianne Cure A on: 01/19/2017 01:14 PM   Modules accepted: Orders

## 2017-02-21 ENCOUNTER — Ambulatory Visit (INDEPENDENT_AMBULATORY_CARE_PROVIDER_SITE_OTHER): Payer: PPO | Admitting: *Deleted

## 2017-02-21 DIAGNOSIS — Z5181 Encounter for therapeutic drug level monitoring: Secondary | ICD-10-CM

## 2017-02-21 DIAGNOSIS — I4891 Unspecified atrial fibrillation: Secondary | ICD-10-CM

## 2017-02-21 DIAGNOSIS — I482 Chronic atrial fibrillation, unspecified: Secondary | ICD-10-CM

## 2017-02-21 LAB — POCT INR: INR: 2

## 2017-02-26 ENCOUNTER — Other Ambulatory Visit: Payer: PPO

## 2017-04-04 ENCOUNTER — Other Ambulatory Visit: Payer: PPO | Admitting: *Deleted

## 2017-04-04 ENCOUNTER — Ambulatory Visit (INDEPENDENT_AMBULATORY_CARE_PROVIDER_SITE_OTHER): Payer: PPO | Admitting: *Deleted

## 2017-04-04 DIAGNOSIS — I482 Chronic atrial fibrillation, unspecified: Secondary | ICD-10-CM

## 2017-04-04 DIAGNOSIS — Z5181 Encounter for therapeutic drug level monitoring: Secondary | ICD-10-CM | POA: Diagnosis not present

## 2017-04-04 DIAGNOSIS — I4891 Unspecified atrial fibrillation: Secondary | ICD-10-CM

## 2017-04-04 LAB — COMPREHENSIVE METABOLIC PANEL
A/G RATIO: 1.5 (ref 1.2–2.2)
ALK PHOS: 72 IU/L (ref 39–117)
ALT: 14 IU/L (ref 0–32)
AST: 13 IU/L (ref 0–40)
Albumin: 4 g/dL (ref 3.5–4.8)
BILIRUBIN TOTAL: 0.2 mg/dL (ref 0.0–1.2)
BUN/Creatinine Ratio: 17 (ref 12–28)
BUN: 23 mg/dL (ref 8–27)
CHLORIDE: 100 mmol/L (ref 96–106)
CO2: 23 mmol/L (ref 20–29)
Calcium: 8.9 mg/dL (ref 8.7–10.3)
Creatinine, Ser: 1.33 mg/dL — ABNORMAL HIGH (ref 0.57–1.00)
GFR calc non Af Amer: 39 mL/min/{1.73_m2} — ABNORMAL LOW (ref >59)
GFR, EST AFRICAN AMERICAN: 45 mL/min/{1.73_m2} — AB (ref >59)
Globulin, Total: 2.6 g/dL (ref 1.5–4.5)
Glucose: 108 mg/dL — ABNORMAL HIGH (ref 65–99)
POTASSIUM: 4.4 mmol/L (ref 3.5–5.2)
Sodium: 139 mmol/L (ref 134–144)
TOTAL PROTEIN: 6.6 g/dL (ref 6.0–8.5)

## 2017-04-04 LAB — LIPID PANEL
CHOLESTEROL TOTAL: 144 mg/dL (ref 100–199)
Chol/HDL Ratio: 3.3 ratio (ref 0.0–4.4)
HDL: 43 mg/dL (ref >39)
LDL Calculated: 77 mg/dL (ref 0–99)
Triglycerides: 122 mg/dL (ref 0–149)
VLDL Cholesterol Cal: 24 mg/dL (ref 5–40)

## 2017-04-04 LAB — POCT INR: INR: 1.9

## 2017-04-06 ENCOUNTER — Telehealth: Payer: Self-pay | Admitting: Interventional Cardiology

## 2017-04-06 NOTE — Telephone Encounter (Signed)
-----   Message from Jettie Booze, MD sent at 04/05/2017  4:49 PM EDT ----- Lipids, liver and electrolytes stable.

## 2017-04-06 NOTE — Telephone Encounter (Signed)
New message ° ° °Patient calling for lab results. Please call °

## 2017-04-06 NOTE — Telephone Encounter (Signed)
Informed patient of results and verbal understanding expressed.  Labs mailed per patient request.

## 2017-04-30 DIAGNOSIS — J209 Acute bronchitis, unspecified: Secondary | ICD-10-CM | POA: Diagnosis not present

## 2017-04-30 DIAGNOSIS — F17200 Nicotine dependence, unspecified, uncomplicated: Secondary | ICD-10-CM | POA: Diagnosis not present

## 2017-04-30 DIAGNOSIS — J9801 Acute bronchospasm: Secondary | ICD-10-CM | POA: Diagnosis not present

## 2017-05-02 DIAGNOSIS — Z7901 Long term (current) use of anticoagulants: Secondary | ICD-10-CM | POA: Diagnosis not present

## 2017-05-30 ENCOUNTER — Encounter (INDEPENDENT_AMBULATORY_CARE_PROVIDER_SITE_OTHER): Payer: Self-pay

## 2017-05-30 ENCOUNTER — Ambulatory Visit (INDEPENDENT_AMBULATORY_CARE_PROVIDER_SITE_OTHER): Payer: PPO

## 2017-05-30 DIAGNOSIS — I4891 Unspecified atrial fibrillation: Secondary | ICD-10-CM

## 2017-05-30 DIAGNOSIS — I482 Chronic atrial fibrillation, unspecified: Secondary | ICD-10-CM

## 2017-05-30 DIAGNOSIS — Z5181 Encounter for therapeutic drug level monitoring: Secondary | ICD-10-CM | POA: Diagnosis not present

## 2017-05-30 LAB — POCT INR: INR: 3.5

## 2017-05-30 NOTE — Patient Instructions (Signed)
Skip today's dosage of Coumadin, then resume same dosage 1/2 pill everyday except 0 pill on Mondays. Recheck INR in 4 weeks. Call us with new medications or bleeding concerns to [place] @ # [phone] [facility].

## 2017-06-27 ENCOUNTER — Ambulatory Visit (INDEPENDENT_AMBULATORY_CARE_PROVIDER_SITE_OTHER): Payer: PPO | Admitting: *Deleted

## 2017-06-27 DIAGNOSIS — I482 Chronic atrial fibrillation, unspecified: Secondary | ICD-10-CM

## 2017-06-27 DIAGNOSIS — I4891 Unspecified atrial fibrillation: Secondary | ICD-10-CM

## 2017-06-27 DIAGNOSIS — Z5181 Encounter for therapeutic drug level monitoring: Secondary | ICD-10-CM | POA: Diagnosis not present

## 2017-06-27 LAB — POCT INR: INR: 2.3

## 2017-06-27 NOTE — Patient Instructions (Signed)
Description   Continue taking the same dosage 1/2 pill everyday except 0 pill on Mondays. Recheck INR in 3 weeks. Call us with new medications or bleeding concerns to [place] @ # [phone] [facility].

## 2017-07-15 NOTE — Progress Notes (Signed)
Cardiology Office Note   Date:  07/17/2017   ID:  Cynthia, Hansen 1940-09-10, MRN [id number]  PCP:  Leighton Ruff, MD    No chief complaint on file.    Wt Readings from Last 3 Encounters:  07/17/17 179 lb 12.8 oz (81.6 kg)  01/11/17 177 lb (80.3 kg)  10/26/16 178 lb (80.7 kg)       History of Present Illness: Cynthia Hansen is a 76 y.o. female   who has a history of atrial fibrillation on warfarin. She underwent cardioversion back in 2007. This lasted for about a year but she reverted back to atrial fibrillation. She has been rate controlled since that time. She also has hypertension, hyperlipidemia, DM, smoker(1/2 ppd), and PVD with 50-69% RICA and patent Left carotid endarterectomy 10/22/14 followed by Dr. Oneida Alar.   She had lost 50 pounds over a 2 year period and maintained the wieght loss. She was exercising 3 days a week at [facility] cardiac rehabilitation and watching her diet.   Dropped to 1-2 x/week.   She continued to smoke half a pack of cigarettes daily until 10/06/2015 when she quit.  She restarted smoking with the stress of her husbands illness.   She had some chest discomfort in 3/18.  She had  anegative troponin and sx. resolved with belching.  Since the last visit, she was diagnosed with ulcerative colitis.  THis has improved with the medications started, mesalamine and colestipol.   Her husband has lung cancer.  He is getting immunotherapy, finished chemo.  Not exercising as much as before due to the weather and her husbands illness.  She has stopped smoking again.    She has maintained her weight loss.    Denies : Chest pain. Dizziness. Leg edema. Nitroglycerin use. Orthopnea. Palpitations. Paroxysmal nocturnal dyspnea. Shortness of breath. Syncope.   She eats greens with her Coumadin, in a steady amount.   Past Medical History:  Diagnosis Date  . A-fib ([place])   . Carotid artery occlusion   . Diabetes mellitus   . Hyperlipidemia   .  Irregular heart beat   . Vitamin D deficiency     Past Surgical History:  Procedure Laterality Date  . ABDOMINAL HERNIA REPAIR    . CAROTID ENDARTERECTOMY Left Dec. 3, 2008    cea  . CATARACT EXTRACTION Left   . HYSTEROSCOPY W/D&C N/A 07/30/2014   Procedure: DILATATION AND CURETTAGE /HYSTEROSCOPY;  Surgeon: Osborne Oman, MD;  Location: [facility];  Service: Gynecology;  Laterality: N/A;  . IUD REMOVAL N/A 07/30/2014   Procedure: INTRAUTERINE DEVICE (IUD) REMOVAL;  Surgeon: Osborne Oman, MD;  Location: [facility];  Service: Gynecology;  Laterality: N/A;     Current Outpatient Medications  Medication Sig Dispense Refill  . acetaminophen (TYLENOL) 500 MG tablet Take 1,000 mg by mouth every 6 (six) hours as needed for mild pain (For back pain.).    Marland Kitchen atorvastatin (LIPITOR) 10 MG tablet TAKE 1 TABLET BY MOUTH ONCE DAILY 90 tablet 1  . colestipol (COLESTID) 1 g tablet Take 1 g by mouth 2 (two) times daily.    Marland Kitchen diltiazem (CARDIZEM CD) 180 MG 24 hr capsule TAKE 1 CAPSULE BY MOUTH TWICE A DAY BEFORE MEALS 180 capsule 3  . furosemide (LASIX) 40 MG tablet Take 40 mg by mouth daily.      Marland Kitchen glimepiride (AMARYL) 1 MG tablet Take 1 mg by mouth daily with breakfast.   1  . HYDROcodone-acetaminophen (NORCO/VICODIN) 5-325 MG  tablet Take 1 tablet by mouth every 6 (six) hours as needed for moderate pain or severe pain. Reported on 11/18/2015    . lisinopril (PRINIVIL,ZESTRIL) 5 MG tablet Take 2.5 mg by mouth daily.    . mesalamine (LIALDA) 1.2 g EC tablet Take 4.8 g by mouth daily with breakfast.    . metFORMIN (GLUCOPHAGE) 1000 MG tablet Take 1,000 mg by mouth 2 (two) times daily with a meal.    . ONE TOUCH ULTRA TEST test strip 1 each by Other route as needed (for BG testing).   5  . potassium chloride SA (K-DUR,KLOR-CON) 20 MEQ tablet TAKE 1 TABLET BY MOUTH 3 DAYS A WEEK 36 tablet 3  . Sodium Bicarbonate-Citric Acid (ALKA-SELTZER HEARTBURN PO) Take 1 each by mouth daily as needed (For heartburn.).    Marland Kitchen  warfarin (COUMADIN) 5 MG tablet TAKE AS DIRECTED BY COUMADIN CLINIC 60 tablet 1   No current facility-administered medications for this visit.     Allergies:   Avandia [rosiglitazone]    Social History:  The patient  reports that she quit smoking about 21 months ago. Her smoking use included cigarettes. She quit after 50.00 years of use. she has never used smokeless tobacco. She reports that she drinks alcohol. She reports that she does not use drugs.   Family History:  The patient's family history includes Cancer in her mother; Heart attack in her father; Hypertension in her sister.    ROS:  Please see the history of present illness.   Otherwise, review of systems are positive for easy bruising.   All other systems are reviewed and negative.    PHYSICAL EXAM: VS:  BP 132/80   Pulse 91   Ht 5\' 9"  (1.753 m)   Wt 179 lb 12.8 oz (81.6 kg)   SpO2 98%   BMI 26.55 kg/m  , BMI Body mass index is 26.55 kg/m. GEN: Well nourished, well developed, in no acute distress  HEENT: normal  Neck: no JVD, carotid bruits, or masses Cardiac: irregularly irregular; no murmurs, rubs, or gallops,no edema  Respiratory:  clear to auscultation bilaterally, normal work of breathing GI: soft, nontender, nondistended, + BS MS: no deformity or atrophy  Skin: warm and dry, no rash Neuro:  Strength and sensation are intact Psych: euthymic mood, full affect   EKG:   The ekg ordered today demonstrates AFib, rate controlled   Recent Labs: 04/04/2017: ALT 14; BUN 23; Creatinine, Ser 1.33; Potassium 4.4; Sodium 139   Lipid Panel    Component Value Date/Time   CHOL 144 04/04/2017 0845   TRIG 122 04/04/2017 0845   HDL 43 04/04/2017 0845   CHOLHDL 3.3 04/04/2017 0845   CHOLHDL 2.7 02/23/2016 0749   VLDL 24 02/23/2016 0749   LDLCALC 77 04/04/2017 0845     Other studies Reviewed: Additional studies/ records that were reviewed today with results demonstrating: carotid Doppler reviewed.   ASSESSMENT  AND PLAN:  1. AFib:  Rate controlled. COumadin for stroke prevention.  Continue rate slowing therapy with anticoagulation.  INR to be checked today.  We discussed Elquis and Xarelto but she will stay with current therapy at this time.  2. Hypertensive heart disease: BP controlled.   3. Carotid disease: Moderate right ICA disease in 8/18.   Patent CEA site 4. DM: A1C 6.7 in 10/18.  5. Hyperlipidemia: Lipids well controlled in 10/18.  LDL 77 at that time.  6. Tobacco abuse: She has stopped smoking.     Current medicines are  reviewed at length with the patient today.  The patient concerns regarding her medicines were addressed.  The following changes have been made:  No change  Labs/ tests ordered today include:  No orders of the defined types were placed in this encounter.   Recommend 150 minutes/week of aerobic exercise Low fat, low carb, high fiber diet recommended  Disposition:   FU in 10 months   Signed, Larae Grooms, MD  07/17/2017 9:14 AM    [facility] [address], [place], [place]  [address] Phone: [phone]; Fax: [phone]

## 2017-07-17 ENCOUNTER — Ambulatory Visit: Payer: PPO | Admitting: Interventional Cardiology

## 2017-07-17 ENCOUNTER — Encounter: Payer: Self-pay | Admitting: Interventional Cardiology

## 2017-07-17 ENCOUNTER — Ambulatory Visit (INDEPENDENT_AMBULATORY_CARE_PROVIDER_SITE_OTHER): Payer: PPO | Admitting: *Deleted

## 2017-07-17 VITALS — BP 132/80 | HR 91 | Ht 69.0 in | Wt 179.8 lb

## 2017-07-17 DIAGNOSIS — I739 Peripheral vascular disease, unspecified: Secondary | ICD-10-CM

## 2017-07-17 DIAGNOSIS — I481 Persistent atrial fibrillation: Secondary | ICD-10-CM | POA: Diagnosis not present

## 2017-07-17 DIAGNOSIS — Z5181 Encounter for therapeutic drug level monitoring: Secondary | ICD-10-CM

## 2017-07-17 DIAGNOSIS — I482 Chronic atrial fibrillation, unspecified: Secondary | ICD-10-CM

## 2017-07-17 DIAGNOSIS — I119 Hypertensive heart disease without heart failure: Secondary | ICD-10-CM

## 2017-07-17 DIAGNOSIS — I779 Disorder of arteries and arterioles, unspecified: Secondary | ICD-10-CM | POA: Diagnosis not present

## 2017-07-17 DIAGNOSIS — F17200 Nicotine dependence, unspecified, uncomplicated: Secondary | ICD-10-CM | POA: Diagnosis not present

## 2017-07-17 DIAGNOSIS — I4819 Other persistent atrial fibrillation: Secondary | ICD-10-CM

## 2017-07-17 DIAGNOSIS — I4891 Unspecified atrial fibrillation: Secondary | ICD-10-CM | POA: Diagnosis not present

## 2017-07-17 DIAGNOSIS — E782 Mixed hyperlipidemia: Secondary | ICD-10-CM | POA: Diagnosis not present

## 2017-07-17 LAB — POCT INR: INR: 2.2

## 2017-07-17 NOTE — Patient Instructions (Signed)
Description   Continue taking the same dosage 1/2 pill everyday except 0 pill on Mondays. Recheck INR in 4 weeks. Call us with new medications or bleeding concerns to [place] @ # [phone] [facility].

## 2017-07-17 NOTE — Patient Instructions (Signed)
Medication Instructions:  Your physician recommends that you continue on your current medications as directed. Please refer to the Current Medication list given to you today.   Labwork: None ordered  Testing/Procedures: None ordered  Follow-Up: Your physician wants you to follow-up in: December with Dr. Irish Lack. You will receive a reminder letter in the mail two months in advance. If you don't receive a letter, please call our office to schedule the follow-up appointment.   Any Other Special Instructions Will Be Listed Below (If Applicable).     If you need a refill on your cardiac medications before your next appointment, please call your pharmacy.

## 2017-07-30 ENCOUNTER — Other Ambulatory Visit: Payer: Self-pay | Admitting: Interventional Cardiology

## 2017-07-30 MED ORDER — ATORVASTATIN CALCIUM 10 MG PO TABS: 10.0000 mg | ORAL_TABLET | Freq: Every day | ORAL | 3 refills | Status: DC

## 2017-07-31 ENCOUNTER — Ambulatory Visit (HOSPITAL_COMMUNITY)
Admission: RE | Admit: 2017-07-31 | Discharge: 2017-07-31 | Disposition: A | Discharge status: Home / Self Care | Payer: PPO | Source: Ambulatory Visit | Attending: Vascular Surgery | Admitting: Vascular Surgery

## 2017-07-31 ENCOUNTER — Ambulatory Visit: Payer: PPO | Admitting: Family

## 2017-07-31 ENCOUNTER — Other Ambulatory Visit: Payer: Self-pay

## 2017-07-31 ENCOUNTER — Encounter: Payer: Self-pay | Admitting: Family

## 2017-07-31 VITALS — BP 114/70 | HR 80 | Temp 97.5°F | Resp 16 | Wt 180.0 lb

## 2017-07-31 DIAGNOSIS — I6523 Occlusion and stenosis of bilateral carotid arteries: Secondary | ICD-10-CM

## 2017-07-31 DIAGNOSIS — Z9889 Other specified postprocedural states: Secondary | ICD-10-CM | POA: Diagnosis not present

## 2017-07-31 DIAGNOSIS — F17200 Nicotine dependence, unspecified, uncomplicated: Secondary | ICD-10-CM | POA: Diagnosis not present

## 2017-07-31 DIAGNOSIS — Z87891 Personal history of nicotine dependence: Secondary | ICD-10-CM | POA: Insufficient documentation

## 2017-07-31 DIAGNOSIS — I10 Essential (primary) hypertension: Secondary | ICD-10-CM | POA: Diagnosis not present

## 2017-07-31 DIAGNOSIS — E119 Type 2 diabetes mellitus without complications: Secondary | ICD-10-CM | POA: Diagnosis not present

## 2017-07-31 DIAGNOSIS — Z794 Long term (current) use of insulin: Secondary | ICD-10-CM | POA: Diagnosis not present

## 2017-07-31 DIAGNOSIS — E785 Hyperlipidemia, unspecified: Secondary | ICD-10-CM | POA: Insufficient documentation

## 2017-07-31 NOTE — Patient Instructions (Signed)

## 2017-07-31 NOTE — Progress Notes (Signed)
Chief Complaint: Follow up Extracranial Carotid Artery Stenosis   History of Present Illness  Cynthia Hansen is a 77 y.o. female patient of Dr. Oneida Alar who is status post left CEA in 2008 by Dr. Amedeo Plenty and returns today for follow up.  The patient denies any history of TIA or stroke symptoms, specifically the patient denies a history of amaurosis fugax or monocular blindness, unilateral facial drooping, hemiplegia, orreceptive or expressive aphasia.   Patient states she has a deteriorating disc in her lumbar spine. She denies claudication symptoms with walking, has low back pain and weakness in legs with climbing stairs, denies non healing wounds.  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 Her walking is limited by back issues.  She has intentionally lost 60pounds in 3+ years. She reports advancing abnormal sensation, like compression hose are on lower legs when they are not, over the last 10 years from her feet up to her knees.  She had a D&C February 2016. Dr. Leighton Ruff is her PCP.  She has ulcerative colitis, her only sx was diarrhea, now managed by prn medication. Her husband is undergoing treatments for non small cell lung cancer; pt states she cannot think about stopping smoking now.    Pt Diabetic: Yes, pt states it is in control Pt smoker: former smoker, quit May 2018, started smoking at age 49-31 yrs  Pt meds include: Statin : Yes, changed to pravastatin from Crestor. She states muscle weakness in her legs since starting a statin. ASA: No Other anticoagulants/antiplatelets: Coumadin for atrial fib    Past Medical History:  Diagnosis Date  . A-fib ([place])   . Carotid artery occlusion   . Diabetes mellitus   . Hyperlipidemia   . Irregular heart beat   . Vitamin D deficiency     Social History Social History   Tobacco Use  . Smoking status: Former Smoker    Years: 50.00    Types:  Cigarettes    Last attempt to quit: 10/05/2016    Years since quitting: 0.8  . Smokeless tobacco: Never Used  Substance Use Topics  . Alcohol use: Yes    Alcohol/week: 0.0 oz  . Drug use: No    Family History Family History  Problem Relation Age of Onset  . Cancer Mother        Uterin  . Heart attack Father   . Hypertension Sister     Surgical History Past Surgical History:  Procedure Laterality Date  . ABDOMINAL HERNIA REPAIR    . CAROTID ENDARTERECTOMY Left Dec. 3, 2008    cea  . CATARACT EXTRACTION Left   . HYSTEROSCOPY W/D&C N/A 07/30/2014   Procedure: DILATATION AND CURETTAGE /HYSTEROSCOPY;  Surgeon: Osborne Oman, MD;  Location: [facility];  Service: Gynecology;  Laterality: N/A;  . IUD REMOVAL N/A 07/30/2014   Procedure: INTRAUTERINE DEVICE (IUD) REMOVAL;  Surgeon: Osborne Oman, MD;  Location: [facility];  Service: Gynecology;  Laterality: N/A;    Allergies  Allergen Reactions  . Avandia [Rosiglitazone] Other (See Comments)    Causes muscle weakness.    Current Outpatient Medications  Medication Sig Dispense Refill  . acetaminophen (TYLENOL) 500 MG tablet Take 1,000 mg by mouth every 6 (six) hours as needed for mild pain (For back pain.).    Marland Kitchen atorvastatin (LIPITOR) 10 MG tablet Take 1 tablet (10 mg total) by mouth daily. 90 tablet 3  . colestipol (COLESTID) 1 g tablet Take 1  g by mouth 2 (two) times daily.    Marland Kitchen diltiazem (CARDIZEM CD) 180 MG 24 hr capsule TAKE 1 CAPSULE BY MOUTH TWICE A DAY BEFORE MEALS 180 capsule 3  . furosemide (LASIX) 40 MG tablet Take 40 mg by mouth daily.      Marland Kitchen glimepiride (AMARYL) 1 MG tablet Take 1 mg by mouth daily with breakfast.   1  . HYDROcodone-acetaminophen (NORCO/VICODIN) 5-325 MG tablet Take 1 tablet by mouth every 6 (six) hours as needed for moderate pain or severe pain. Reported on 11/18/2015    . lisinopril (PRINIVIL,ZESTRIL) 5 MG tablet Take 2.5 mg by mouth daily.    . mesalamine (LIALDA) 1.2 g EC tablet Take 4.8 g by mouth  daily with breakfast.    . metFORMIN (GLUCOPHAGE) 1000 MG tablet Take 1,000 mg by mouth 2 (two) times daily with a meal.    . ONE TOUCH ULTRA TEST test strip 1 each by Other route as needed (for BG testing).   5  . potassium chloride SA (K-DUR,KLOR-CON) 20 MEQ tablet TAKE 1 TABLET BY MOUTH 3 DAYS A WEEK 36 tablet 3  . Sodium Bicarbonate-Citric Acid (ALKA-SELTZER HEARTBURN PO) Take 1 each by mouth daily as needed (For heartburn.).    Marland Kitchen warfarin (COUMADIN) 5 MG tablet TAKE AS DIRECTED BY COUMADIN CLINIC 60 tablet 1   No current facility-administered medications for this visit.     Review of Systems : See HPI for pertinent positives and negatives.  Physical Examination  Vitals:   07/31/17 1031 07/31/17 1032 07/31/17 1033  BP: (!) 146/77 125/76 114/70  Pulse: 80    Resp: 16    Temp: (!) 97.5 F (36.4 C)    TempSrc: Oral    SpO2: 100%    Weight: 180 lb (81.6 kg)     Body mass index is 26.58 kg/m.  General: WDWN female in NAD GAIT:normal HENT: No gross abnormalities  Eyes: PERRLA Pulmonary: Respirations are non-labored, adequateair movement in all fields, no rales,rhonchi, or wheezing. Cardiac: Irregular rhythm, controlled rate, no detected murmur.  VASCULAR EXAM Carotid Bruits Right Left   Negative Negative    Abdominal aortic pulse is not palpable. Radial pulses are 2+ palpable and equal.      LE Pulses Right Left   FEMORAL 2+ palpable 2+ palpable   POPLITEAL not palpable  not palpable   POSTERIOR TIBIAL 1+palpable  not palpable    DORSALIS PEDIS  ANTERIOR TIBIAL notpalpable  notpalpable     Gastrointestinal: soft, nontender, BS WNL, no r/g, no palpable masses. Musculoskeletal: Generalized mild muscle atrophy/wasting. M/S 4/5 throughout,  extremities without ischemic changes. Skin: No rashes, no ulcers, no cellulitis.    Neurologic: A&O X 3; appropriate affect, speech is normal, CN 2-12 intact except mild hearing loss, is wearing hearing aids, Pain and light touch intact in extremities, Motor exam as listed above. Psychiatric: Normal thought content, mood appropriate to clinical situation.    Assessment: Cynthia Hansen is a 77 y.o. female whois status post left CEA in 2008. She has no history of stroke or TIA.  She is exercisingregularly and has intentionally lost 66pounds in 3-4years. Her DM seems to be in control, and she quit smoking in May 2018 (smoked x 45 years).   She has back issues which limit her walking, does not seem to have claudication, but pedal pulses are not present except right PT. ABI's were normal on 01-11-17.  I advised pt at her visit on 01-11-17 to follow up with her  PCP re thyroid nodules detected on duplex today and at that visit.    DATA Carotid Duplex (07/31/17): 60-79% right ICA stenosis. Left carotid endarterectomy site with 1-39% restenosis  Bilateral vertebral artery flow is antegrade.  Bilateral subclavian artery waveforms are normal.  Multiple small nodules seen in the thyroid. Mild increase in stenosis in the lft ICA, stable in the right, compared to the exam on 01-11-17.    ABI (Date: 01-11-17):  R:   ABI: 1.03 (no previous for comparison),   PT: tri  DP: tri  TBI:  0.60  L:   ABI: 0.99 (no previous),   PT: tri  DP: tri  TBI: 0.62  Normal bilateral ABI and TBI    Plan:  Follow-up in 7monthswith Carotid Duplex.  I discussed in depth with the patient the nature of atherosclerosis, and emphasized the importance of maximal medical management including strict control of blood pressure, blood glucose, and lipid levels, obtaining regular exercise, and continued cessation of smoking.  The patient is aware that without maximal medical management the underlying  atherosclerotic disease process will progress, limiting the benefit of any interventions. The patient was given information about stroke prevention and what symptoms should prompt the patient to seek immediate medical care. Thank you for allowing [place] to participate in this patient's care.  Clemon Chambers, RN, MSN, FNP-C [facility] Office: [phone]  Clinic Physician: Early  07/31/17 10:42 AM

## 2017-08-01 DIAGNOSIS — H903 Sensorineural hearing loss, bilateral: Secondary | ICD-10-CM | POA: Diagnosis not present

## 2017-08-01 LAB — VAS US CAROTID
LCCADDIAS: -14 cm/s
LCCADSYS: -65 cm/s
LCCAPDIAS: 17 cm/s
LEFT ECA DIAS: -16 cm/s
LEFT VERTEBRAL DIAS: -5 cm/s
LICADSYS: -81 cm/s
LICAPDIAS: -18 cm/s
Left CCA prox sys: 78 cm/s
Left ICA dist dias: -23 cm/s
Left ICA prox sys: -69 cm/s
RCCADSYS: -86 cm/s
RCCAPDIAS: 15 cm/s
RCCAPSYS: 80 cm/s
RIGHT ECA DIAS: -4 cm/s
RIGHT VERTEBRAL DIAS: 18 cm/s

## 2017-08-13 DIAGNOSIS — Q72892 Other reduction defects of left lower limb: Secondary | ICD-10-CM | POA: Diagnosis not present

## 2017-08-13 DIAGNOSIS — M5137 Other intervertebral disc degeneration, lumbosacral region: Secondary | ICD-10-CM | POA: Diagnosis not present

## 2017-08-13 DIAGNOSIS — M5416 Radiculopathy, lumbar region: Secondary | ICD-10-CM | POA: Diagnosis not present

## 2017-08-13 DIAGNOSIS — M9903 Segmental and somatic dysfunction of lumbar region: Secondary | ICD-10-CM | POA: Diagnosis not present

## 2017-08-13 DIAGNOSIS — M9904 Segmental and somatic dysfunction of sacral region: Secondary | ICD-10-CM | POA: Diagnosis not present

## 2017-08-13 DIAGNOSIS — M50321 Other cervical disc degeneration at C4-C5 level: Secondary | ICD-10-CM | POA: Diagnosis not present

## 2017-08-13 DIAGNOSIS — M9901 Segmental and somatic dysfunction of cervical region: Secondary | ICD-10-CM | POA: Diagnosis not present

## 2017-08-13 DIAGNOSIS — M9905 Segmental and somatic dysfunction of pelvic region: Secondary | ICD-10-CM | POA: Diagnosis not present

## 2017-08-14 ENCOUNTER — Ambulatory Visit (INDEPENDENT_AMBULATORY_CARE_PROVIDER_SITE_OTHER): Payer: PPO | Admitting: Pharmacist

## 2017-08-14 DIAGNOSIS — I4891 Unspecified atrial fibrillation: Secondary | ICD-10-CM

## 2017-08-14 DIAGNOSIS — Z5181 Encounter for therapeutic drug level monitoring: Secondary | ICD-10-CM | POA: Diagnosis not present

## 2017-08-14 LAB — POCT INR: INR: 3.6

## 2017-08-14 NOTE — Patient Instructions (Signed)
Description   Skip your Coumadin today, then continue taking the same dosage 1/2 pill everyday except 0 pill on Mondays. Recheck INR in 3 weeks. Call us with new medications or bleeding concerns to [place] @ # [phone] [facility].

## 2017-08-22 ENCOUNTER — Other Ambulatory Visit: Payer: Self-pay

## 2017-08-22 ENCOUNTER — Other Ambulatory Visit: Payer: Self-pay | Admitting: Interventional Cardiology

## 2017-08-22 DIAGNOSIS — I6523 Occlusion and stenosis of bilateral carotid arteries: Secondary | ICD-10-CM

## 2017-08-22 DIAGNOSIS — I6529 Occlusion and stenosis of unspecified carotid artery: Secondary | ICD-10-CM

## 2017-09-01 ENCOUNTER — Other Ambulatory Visit: Payer: Self-pay | Admitting: Interventional Cardiology

## 2017-09-04 ENCOUNTER — Ambulatory Visit (INDEPENDENT_AMBULATORY_CARE_PROVIDER_SITE_OTHER): Payer: PPO | Admitting: *Deleted

## 2017-09-04 DIAGNOSIS — I482 Chronic atrial fibrillation, unspecified: Secondary | ICD-10-CM

## 2017-09-04 DIAGNOSIS — I4891 Unspecified atrial fibrillation: Secondary | ICD-10-CM | POA: Diagnosis not present

## 2017-09-04 DIAGNOSIS — Z5181 Encounter for therapeutic drug level monitoring: Secondary | ICD-10-CM

## 2017-09-04 LAB — POCT INR: INR: 2.3

## 2017-09-04 NOTE — Patient Instructions (Signed)
Description   Continue taking the same dosage 1/2 pill everyday except 0 pill on Mondays. Recheck INR in 4 weeks. Call us with new medications or bleeding concerns to [place] @ # [phone] [facility].

## 2017-09-18 DIAGNOSIS — E782 Mixed hyperlipidemia: Secondary | ICD-10-CM | POA: Diagnosis not present

## 2017-09-18 DIAGNOSIS — E538 Deficiency of other specified B group vitamins: Secondary | ICD-10-CM | POA: Diagnosis not present

## 2017-09-18 DIAGNOSIS — E559 Vitamin D deficiency, unspecified: Secondary | ICD-10-CM | POA: Diagnosis not present

## 2017-09-18 DIAGNOSIS — Z7984 Long term (current) use of oral hypoglycemic drugs: Secondary | ICD-10-CM | POA: Diagnosis not present

## 2017-09-18 DIAGNOSIS — I4891 Unspecified atrial fibrillation: Secondary | ICD-10-CM | POA: Diagnosis not present

## 2017-09-18 DIAGNOSIS — I10 Essential (primary) hypertension: Secondary | ICD-10-CM | POA: Diagnosis not present

## 2017-09-18 DIAGNOSIS — Z7901 Long term (current) use of anticoagulants: Secondary | ICD-10-CM | POA: Diagnosis not present

## 2017-09-18 DIAGNOSIS — N183 Chronic kidney disease, stage 3 (moderate): Secondary | ICD-10-CM | POA: Diagnosis not present

## 2017-09-18 DIAGNOSIS — E119 Type 2 diabetes mellitus without complications: Secondary | ICD-10-CM | POA: Diagnosis not present

## 2017-09-21 ENCOUNTER — Telehealth: Payer: Self-pay

## 2017-09-21 DIAGNOSIS — E782 Mixed hyperlipidemia: Secondary | ICD-10-CM

## 2017-09-21 MED ORDER — ATORVASTATIN CALCIUM 20 MG PO TABS: 20.0000 mg | ORAL_TABLET | Freq: Every day | ORAL | 3 refills | Status: DC

## 2017-09-21 NOTE — Telephone Encounter (Signed)
-----   Message from Jettie Booze, MD sent at 09/21/2017 10:35 AM EDT ----- LDL above target.  Increase atorvastatin to 20 mg daily and recheck liver and lipids in 3 months

## 2017-09-21 NOTE — Telephone Encounter (Signed)
Called and made patient aware that her LDL was 126 and above target. Instructed patient to increase atorvastatin to 20 mg QD and repeat fasting labs in 3 months. Patient verbalized understanding and thanked me for the call. Rx sent to preferred pharmacy and lab appointment made for 7/23.

## 2017-10-02 ENCOUNTER — Encounter (INDEPENDENT_AMBULATORY_CARE_PROVIDER_SITE_OTHER): Payer: Self-pay

## 2017-10-02 ENCOUNTER — Ambulatory Visit: Payer: PPO | Admitting: Pharmacist

## 2017-10-02 DIAGNOSIS — I482 Chronic atrial fibrillation, unspecified: Secondary | ICD-10-CM

## 2017-10-02 DIAGNOSIS — I4891 Unspecified atrial fibrillation: Secondary | ICD-10-CM | POA: Diagnosis not present

## 2017-10-02 DIAGNOSIS — Z5181 Encounter for therapeutic drug level monitoring: Secondary | ICD-10-CM

## 2017-10-02 LAB — POCT INR: INR: 4

## 2017-10-02 NOTE — Patient Instructions (Signed)
Description   NO warfarin today then continue taking the same dosage 1/2 pill everyday except 0 pill on Mondays. We will consider decreasing the tablet strength at next visit if your supply is low.  Recheck INR in 3 weeks. Call us with new medications or bleeding concerns to [place] @ # [phone] [facility].

## 2017-10-22 DIAGNOSIS — H11151 Pinguecula, right eye: Secondary | ICD-10-CM | POA: Diagnosis not present

## 2017-10-22 DIAGNOSIS — H43391 Other vitreous opacities, right eye: Secondary | ICD-10-CM | POA: Diagnosis not present

## 2017-10-22 DIAGNOSIS — H5319 Other subjective visual disturbances: Secondary | ICD-10-CM | POA: Diagnosis not present

## 2017-10-22 DIAGNOSIS — H43811 Vitreous degeneration, right eye: Secondary | ICD-10-CM | POA: Diagnosis not present

## 2017-10-24 ENCOUNTER — Ambulatory Visit: Payer: PPO | Admitting: *Deleted

## 2017-10-24 DIAGNOSIS — Z5181 Encounter for therapeutic drug level monitoring: Secondary | ICD-10-CM

## 2017-10-24 DIAGNOSIS — I4891 Unspecified atrial fibrillation: Secondary | ICD-10-CM | POA: Diagnosis not present

## 2017-10-24 DIAGNOSIS — I482 Chronic atrial fibrillation, unspecified: Secondary | ICD-10-CM

## 2017-10-24 LAB — POCT INR: INR: 3.2 — AB (ref 2.0–3.0)

## 2017-10-24 NOTE — Patient Instructions (Signed)
Description   NO warfarin today then continue taking the same dosage 1/2 pill everyday except 0 pill on Mondays. We will consider decreasing the tablet strength at next visit if your supply is low.  Recheck INR in 2 weeks. Call us with new medications or bleeding concerns to [place] @ # [phone] [facility].

## 2017-10-28 DIAGNOSIS — S80812A Abrasion, left lower leg, initial encounter: Secondary | ICD-10-CM | POA: Diagnosis not present

## 2017-10-28 DIAGNOSIS — S90811A Abrasion, right foot, initial encounter: Secondary | ICD-10-CM | POA: Diagnosis not present

## 2017-10-28 DIAGNOSIS — W19XXXA Unspecified fall, initial encounter: Secondary | ICD-10-CM | POA: Diagnosis not present

## 2017-10-30 DIAGNOSIS — M79674 Pain in right toe(s): Secondary | ICD-10-CM | POA: Diagnosis not present

## 2017-10-30 DIAGNOSIS — S80812D Abrasion, left lower leg, subsequent encounter: Secondary | ICD-10-CM | POA: Diagnosis not present

## 2017-11-06 DIAGNOSIS — S81812D Laceration without foreign body, left lower leg, subsequent encounter: Secondary | ICD-10-CM | POA: Diagnosis not present

## 2017-11-06 DIAGNOSIS — M79675 Pain in left toe(s): Secondary | ICD-10-CM | POA: Diagnosis not present

## 2017-11-07 ENCOUNTER — Encounter (INDEPENDENT_AMBULATORY_CARE_PROVIDER_SITE_OTHER): Payer: Self-pay

## 2017-11-07 ENCOUNTER — Ambulatory Visit: Payer: PPO | Admitting: *Deleted

## 2017-11-07 DIAGNOSIS — I482 Chronic atrial fibrillation, unspecified: Secondary | ICD-10-CM

## 2017-11-07 DIAGNOSIS — Z5181 Encounter for therapeutic drug level monitoring: Secondary | ICD-10-CM

## 2017-11-07 DIAGNOSIS — I4891 Unspecified atrial fibrillation: Secondary | ICD-10-CM | POA: Diagnosis not present

## 2017-11-07 LAB — POCT INR: INR: 3.4 — AB (ref 2.0–3.0)

## 2017-11-07 NOTE — Patient Instructions (Signed)
Description   NO warfarin today then continue taking the same dosage 1/2 pill everyday except 0 pill on Mondays.  Increase your green leafy intake and eat 2-3 times per week. We will consider decreasing the tablet strength at next visit if your supply is low (just got a refill-do not pick up anymore).  Recheck INR in 2 weeks. Call us with new medications or bleeding concerns to [place] @ # [phone] [facility].

## 2017-11-18 ENCOUNTER — Other Ambulatory Visit: Payer: Self-pay | Admitting: Interventional Cardiology

## 2017-11-20 ENCOUNTER — Ambulatory Visit (INDEPENDENT_AMBULATORY_CARE_PROVIDER_SITE_OTHER): Payer: PPO | Admitting: *Deleted

## 2017-11-20 DIAGNOSIS — Z5181 Encounter for therapeutic drug level monitoring: Secondary | ICD-10-CM | POA: Diagnosis not present

## 2017-11-20 DIAGNOSIS — I4891 Unspecified atrial fibrillation: Secondary | ICD-10-CM | POA: Diagnosis not present

## 2017-11-20 DIAGNOSIS — I482 Chronic atrial fibrillation, unspecified: Secondary | ICD-10-CM

## 2017-11-20 LAB — POCT INR: INR: 2.6 (ref 2.0–3.0)

## 2017-11-20 NOTE — Patient Instructions (Signed)
Description   Continue taking the same dosage 1/2 pill everyday except 0 pill on Mondays.  Continue eating your green leafy intake and eat 2-3 times per week. We will consider decreasing the tablet strength at next visit if your supply is low (just got a refill-do not pick up anymore).  Recheck INR in 3 weeks. Call us with new medications or bleeding concerns to [place] @ # [phone] [facility].

## 2017-12-11 ENCOUNTER — Ambulatory Visit: Payer: PPO | Admitting: *Deleted

## 2017-12-11 DIAGNOSIS — Z5181 Encounter for therapeutic drug level monitoring: Secondary | ICD-10-CM

## 2017-12-11 DIAGNOSIS — I4891 Unspecified atrial fibrillation: Secondary | ICD-10-CM | POA: Diagnosis not present

## 2017-12-11 DIAGNOSIS — I482 Chronic atrial fibrillation, unspecified: Secondary | ICD-10-CM

## 2017-12-11 LAB — POCT INR: INR: 2.4 (ref 2.0–3.0)

## 2017-12-11 NOTE — Patient Instructions (Signed)
Description   Continue taking the same dosage 1/2 pill everyday except 0 pill on Mondays.  Continue eating your green leafy intake and eat 2-3 times per week. We will consider decreasing the tablet strength at next visit if your supply is low (just got a refill-do not pick up anymore).  Recheck INR in 4 weeks. Call us with new medications or bleeding concerns to [place] @ # [phone] [facility].

## 2017-12-19 DIAGNOSIS — R809 Proteinuria, unspecified: Secondary | ICD-10-CM | POA: Diagnosis not present

## 2017-12-19 DIAGNOSIS — E119 Type 2 diabetes mellitus without complications: Secondary | ICD-10-CM | POA: Diagnosis not present

## 2017-12-19 DIAGNOSIS — E7800 Pure hypercholesterolemia, unspecified: Secondary | ICD-10-CM | POA: Diagnosis not present

## 2017-12-25 ENCOUNTER — Other Ambulatory Visit: Payer: PPO

## 2017-12-25 DIAGNOSIS — E782 Mixed hyperlipidemia: Secondary | ICD-10-CM | POA: Diagnosis not present

## 2017-12-25 LAB — HEPATIC FUNCTION PANEL
ALBUMIN: 4 g/dL (ref 3.5–4.8)
ALT: 4 IU/L (ref 0–32)
AST: 7 IU/L (ref 0–40)
Alkaline Phosphatase: 88 IU/L (ref 39–117)
BILIRUBIN TOTAL: 0.2 mg/dL (ref 0.0–1.2)
BILIRUBIN, DIRECT: 0.07 mg/dL (ref 0.00–0.40)
TOTAL PROTEIN: 6.5 g/dL (ref 6.0–8.5)

## 2017-12-25 LAB — LIPID PANEL
CHOL/HDL RATIO: 3.5 ratio (ref 0.0–4.4)
Cholesterol, Total: 132 mg/dL (ref 100–199)
HDL: 38 mg/dL — ABNORMAL LOW (ref >39)
LDL CALC: 66 mg/dL (ref 0–99)
Triglycerides: 141 mg/dL (ref 0–149)
VLDL CHOLESTEROL CAL: 28 mg/dL (ref 5–40)

## 2017-12-31 ENCOUNTER — Telehealth: Payer: Self-pay

## 2017-12-31 NOTE — Telephone Encounter (Signed)
Error

## 2018-01-08 ENCOUNTER — Ambulatory Visit (HOSPITAL_COMMUNITY)
Admission: RE | Admit: 2018-01-08 | Discharge: 2018-01-08 | Disposition: A | Discharge status: Home / Self Care | Payer: PPO | Source: Ambulatory Visit | Attending: Vascular Surgery | Admitting: Vascular Surgery

## 2018-01-08 ENCOUNTER — Ambulatory Visit: Payer: PPO

## 2018-01-08 ENCOUNTER — Encounter: Payer: Self-pay | Admitting: Family

## 2018-01-08 ENCOUNTER — Ambulatory Visit: Payer: PPO | Admitting: Family

## 2018-01-08 VITALS — BP 137/78 | HR 74 | Resp 18 | Ht 69.0 in | Wt 181.0 lb

## 2018-01-08 DIAGNOSIS — Z87891 Personal history of nicotine dependence: Secondary | ICD-10-CM

## 2018-01-08 DIAGNOSIS — Z9889 Other specified postprocedural states: Secondary | ICD-10-CM | POA: Diagnosis not present

## 2018-01-08 DIAGNOSIS — Z5181 Encounter for therapeutic drug level monitoring: Secondary | ICD-10-CM | POA: Diagnosis not present

## 2018-01-08 DIAGNOSIS — I4891 Unspecified atrial fibrillation: Secondary | ICD-10-CM

## 2018-01-08 DIAGNOSIS — I6529 Occlusion and stenosis of unspecified carotid artery: Secondary | ICD-10-CM

## 2018-01-08 DIAGNOSIS — I6523 Occlusion and stenosis of bilateral carotid arteries: Secondary | ICD-10-CM | POA: Insufficient documentation

## 2018-01-08 DIAGNOSIS — I482 Chronic atrial fibrillation, unspecified: Secondary | ICD-10-CM

## 2018-01-08 LAB — POCT INR: INR: 2.6 (ref 2.0–3.0)

## 2018-01-08 NOTE — Patient Instructions (Signed)
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
## 2018-01-08 NOTE — Patient Instructions (Signed)
Description   Continue taking the same dosage 1/2 pill everyday except 0 pill on Mondays.  Continue eating your green leafy intake and eat 2-3 times per week. We will consider decreasing the tablet strength at next visit if your supply is low (just got a refill-do not pick up anymore).  Recheck INR in 6 weeks. Call us with new medications or bleeding concerns to [place] @ # [phone] [facility].

## 2018-01-16 ENCOUNTER — Other Ambulatory Visit: Payer: Self-pay | Admitting: Interventional Cardiology

## 2018-01-17 DIAGNOSIS — S80812A Abrasion, left lower leg, initial encounter: Secondary | ICD-10-CM | POA: Diagnosis not present

## 2018-01-17 DIAGNOSIS — C44629 Squamous cell carcinoma of skin of left upper limb, including shoulder: Secondary | ICD-10-CM | POA: Diagnosis not present

## 2018-02-14 DIAGNOSIS — Z08 Encounter for follow-up examination after completed treatment for malignant neoplasm: Secondary | ICD-10-CM | POA: Diagnosis not present

## 2018-02-14 DIAGNOSIS — Z85828 Personal history of other malignant neoplasm of skin: Secondary | ICD-10-CM | POA: Diagnosis not present

## 2018-02-14 DIAGNOSIS — L308 Other specified dermatitis: Secondary | ICD-10-CM | POA: Diagnosis not present

## 2018-02-19 ENCOUNTER — Ambulatory Visit: Payer: PPO

## 2018-02-19 ENCOUNTER — Other Ambulatory Visit: Payer: Self-pay | Admitting: Interventional Cardiology

## 2018-02-19 DIAGNOSIS — R928 Other abnormal and inconclusive findings on diagnostic imaging of breast: Secondary | ICD-10-CM | POA: Diagnosis not present

## 2018-02-19 DIAGNOSIS — I482 Chronic atrial fibrillation, unspecified: Secondary | ICD-10-CM

## 2018-02-19 DIAGNOSIS — Z5181 Encounter for therapeutic drug level monitoring: Secondary | ICD-10-CM | POA: Diagnosis not present

## 2018-02-19 DIAGNOSIS — I4891 Unspecified atrial fibrillation: Secondary | ICD-10-CM | POA: Diagnosis not present

## 2018-02-19 LAB — POCT INR: INR: 3.4 — AB (ref 2.0–3.0)

## 2018-02-19 MED ORDER — WARFARIN SODIUM 2.5 MG PO TABS: ORAL_TABLET | ORAL | 1 refills | Status: DC

## 2018-02-19 NOTE — Patient Instructions (Signed)
Please skip coumadin today, then START NEW DOSAGE of 1pill everyday except 0 pill on Mondays.  Continue eating your green leafy intake and eat 2-3 times per week.  Recheck INR in 4 weeks. Call us with new medications or bleeding concerns to [place] @ # [phone] [facility].

## 2018-03-19 ENCOUNTER — Encounter (INDEPENDENT_AMBULATORY_CARE_PROVIDER_SITE_OTHER): Payer: Self-pay

## 2018-03-19 ENCOUNTER — Ambulatory Visit: Payer: PPO

## 2018-03-19 DIAGNOSIS — Z5181 Encounter for therapeutic drug level monitoring: Secondary | ICD-10-CM | POA: Diagnosis not present

## 2018-03-19 DIAGNOSIS — I4891 Unspecified atrial fibrillation: Secondary | ICD-10-CM | POA: Diagnosis not present

## 2018-03-19 LAB — POCT INR: INR: 3.2 — AB (ref 2.0–3.0)

## 2018-03-19 NOTE — Patient Instructions (Signed)
Description   Start taking 1 tablet daily except 1/2 tablet on Mondays, Wednesdays and Fridays.  Continue eating your green leafy intake and eat 2-3 times per week.  Recheck INR in 10 days with Dr Irish Lack appt.  Call us with new medications or bleeding concerns to [place] @ # [phone] [facility].

## 2018-03-21 DIAGNOSIS — Z7901 Long term (current) use of anticoagulants: Secondary | ICD-10-CM | POA: Diagnosis not present

## 2018-03-21 DIAGNOSIS — Z23 Encounter for immunization: Secondary | ICD-10-CM | POA: Diagnosis not present

## 2018-03-21 DIAGNOSIS — E782 Mixed hyperlipidemia: Secondary | ICD-10-CM | POA: Diagnosis not present

## 2018-03-21 DIAGNOSIS — E538 Deficiency of other specified B group vitamins: Secondary | ICD-10-CM | POA: Diagnosis not present

## 2018-03-21 DIAGNOSIS — R42 Dizziness and giddiness: Secondary | ICD-10-CM | POA: Diagnosis not present

## 2018-03-21 DIAGNOSIS — N183 Chronic kidney disease, stage 3 (moderate): Secondary | ICD-10-CM | POA: Diagnosis not present

## 2018-03-21 DIAGNOSIS — I10 Essential (primary) hypertension: Secondary | ICD-10-CM | POA: Diagnosis not present

## 2018-03-21 DIAGNOSIS — J302 Other seasonal allergic rhinitis: Secondary | ICD-10-CM | POA: Diagnosis not present

## 2018-03-21 DIAGNOSIS — E119 Type 2 diabetes mellitus without complications: Secondary | ICD-10-CM | POA: Diagnosis not present

## 2018-03-21 DIAGNOSIS — E559 Vitamin D deficiency, unspecified: Secondary | ICD-10-CM | POA: Diagnosis not present

## 2018-03-28 ENCOUNTER — Telehealth: Payer: Self-pay | Admitting: Interventional Cardiology

## 2018-03-28 NOTE — Telephone Encounter (Signed)
Called and made patient aware that we have received a copy of her most recent labs with PCP. Made her aware that he may want to recheck her kidney function but she does not need to be fasting for that lab. Patient verbalized understanding and thanked me for the call.

## 2018-03-28 NOTE — Telephone Encounter (Signed)
New message:      Pt is calling and would like to know if she is needing labs before her appt tomorrow or the day of. Pt states to leave a detailed msg if she does not answer.

## 2018-03-28 NOTE — Progress Notes (Signed)
Cardiology Office Note   Date:  03/29/2018   ID:  Brilee, [place] 1941/02/07, MRN [id number]  PCP:  Leighton Ruff, MD    No chief complaint on file.  AFib  Wt Readings from Last 3 Encounters:  03/29/18 183 lb (83 kg)  01/08/18 181 lb (82.1 kg)  07/31/17 180 lb (81.6 kg)       History of Present Illness: Cynthia Hansen is a 77 y.o. female  who has a history of atrial fibrillation on warfarin. She underwent cardioversion back in 2007. This lasted for about a year but she reverted back to atrial fibrillation. She has been rate controlled since that time. She also has hypertension, hyperlipidemia, DM, smoker(1/2 ppd), and PVD with 50-69% RICA and patent Left carotid endarterectomy 10/22/14 followed by Dr. Oneida Alar.  She had lost 50 pounds over a 2 year period and maintained the wieght loss. She was exercising 3 days a week at [facility] cardiac rehabilitation and watching her diet. Dropped to 1-2 x/week.  She continued to smoke half a pack of cigarettes daily until 10/06/2015 when she quit. She restarted smoking with the stress of her husbands illness.   Shehad somechest discomfort in 3/18.She had anegative troponin and sx. resolved with belching.  She was diagnosed with ulcerative colitis. THis has improved with the medications started, mesalamine and colestipol.   Her husband has lung cancer. He was treated with immunotherapy.  He will need chemo and radiation.  Since the last visit, she has more dizziness.  She stands up and waits for a second.  She then feels dizzy after walking 10-20 seconds.  She has to hold on to something.  Does not feel the room spinning.  She closes her eyes for a few seconds and then she is ok.    Denies : Chest pain. Leg edema. Nitroglycerin use. Orthopnea. Palpitations. Paroxysmal nocturnal dyspnea. Shortness of breath. Syncope.   Trying to exercise regularly.  She tries to stay hydrated as well. THis may help reduce  dizziness.       Past Medical History:  Diagnosis Date  . A-fib ([employer])   . Carotid artery occlusion   . Diabetes mellitus   . Hyperlipidemia   . Irregular heart beat   . Vitamin D deficiency     Past Surgical History:  Procedure Laterality Date  . ABDOMINAL HERNIA REPAIR    . CAROTID ENDARTERECTOMY Left Dec. 3, 2008    cea  . CATARACT EXTRACTION Left   . HYSTEROSCOPY W/D&C N/A 07/30/2014   Procedure: DILATATION AND CURETTAGE /HYSTEROSCOPY;  Surgeon: Osborne Oman, MD;  Location: [facility];  Service: Gynecology;  Laterality: N/A;  . IUD REMOVAL N/A 07/30/2014   Procedure: INTRAUTERINE DEVICE (IUD) REMOVAL;  Surgeon: Osborne Oman, MD;  Location: [facility];  Service: Gynecology;  Laterality: N/A;     Current Outpatient Medications  Medication Sig Dispense Refill  . acetaminophen (TYLENOL) 500 MG tablet Take 1,000 mg by mouth every 6 (six) hours as needed for mild pain (For back pain.).    Marland Kitchen atorvastatin (LIPITOR) 20 MG tablet Take 1 tablet (20 mg total) by mouth daily. 90 tablet 3  . colestipol (COLESTID) 1 g tablet Take 1 g by mouth 2 (two) times daily.    Marland Kitchen diltiazem (CARTIA XT) 180 MG 24 hr capsule TAKE 1 CAPSULE BY MOUTH TWICE DAILY BEFORE MEALS 180 capsule 2  . furosemide (LASIX) 40 MG tablet Take 40 mg by mouth daily.      Marland Kitchen  HYDROcodone-acetaminophen (NORCO/VICODIN) 5-325 MG tablet Take 1 tablet by mouth every 6 (six) hours as needed for moderate pain or severe pain. Reported on 11/18/2015    . lisinopril (PRINIVIL,ZESTRIL) 5 MG tablet Take 2.5 mg by mouth daily.    . mesalamine (LIALDA) 1.2 g EC tablet Take 4.8 g by mouth daily with breakfast.    . ONE TOUCH ULTRA TEST test strip 1 each by Other route as needed (for BG testing).   5  . potassium chloride SA (K-DUR,KLOR-CON) 20 MEQ tablet TAKE 1 TABLET BY MOUTH 3 DAYS A WEEK 36 tablet 2  . pseudoephedrine-guaifenesin (MUCINEX D) 60-600 MG 12 hr tablet Take 1 tablet by mouth every 12 (twelve) hours.    Marland Kitchen warfarin (COUMADIN)  2.5 MG tablet TAKE BY MOUTH AS DIRECTED BY COUMADIN CLINIC 90 tablet 1  . glimepiride (AMARYL) 1 MG tablet Take 1 mg by mouth daily with breakfast.   1   No current facility-administered medications for this visit.     Allergies:   Avandia [rosiglitazone]    Social History:  The patient  reports that she quit smoking about 17 months ago. Her smoking use included cigarettes. She quit after 50.00 years of use. She has never used smokeless tobacco. She reports that she drinks alcohol. She reports that she does not use drugs.   Family History:  The patient's family history includes Cancer in her mother; Heart attack in her father; Hypertension in her sister.    ROS:  Please see the history of present illness.   Otherwise, review of systems are positive for dizziness.   All other systems are reviewed and negative.    PHYSICAL EXAM: VS:  Ht 5\' 9"  (1.753 m)   Wt 183 lb (83 kg)   SpO2 99%   BMI 27.02 kg/m  , BMI Body mass index is 27.02 kg/m. GEN: Well nourished, well developed, in no acute distress  HEENT: normal  Neck: no JVD, carotid bruits, or masses Cardiac: irregularly irregular; no murmurs, rubs, or gallops,no edema  Respiratory:  clear to auscultation bilaterally, normal work of breathing GI: soft, nontender, nondistended, + BS MS: no deformity or atrophy  Skin: warm and dry, no rash Neuro:  Strength and sensation are intact Psych: euthymic mood, full affect   EKG:   The ekg ordered today demonstrates AFib, rate controlled   Recent Labs: 04/04/2017: BUN 23; Creatinine, Ser 1.33; Potassium 4.4; Sodium 139 12/25/2017: ALT 4   Lipid Panel    Component Value Date/Time   CHOL 132 12/25/2017 0737   TRIG 141 12/25/2017 0737   HDL 38 (L) 12/25/2017 0737   CHOLHDL 3.5 12/25/2017 0737   CHOLHDL 2.7 02/23/2016 0749   VLDL 24 02/23/2016 0749   LDLCALC 66 12/25/2017 0737     Other studies Reviewed: Additional studies/ records that were reviewed today with results  demonstrating: Cr increased to 1.48 at last check.   ASSESSMENT AND PLAN:  1. AFib: Rate controlled.  Coumadin for stroke prevention. I don;t think her dizziness is cardiac mediated.  AFib is well controlled.  She had mildly increased Cr.  Stay hydrated and recheck in few weeks.  THis may help dizziness as well.  2. Hypertensive heart disease: The current medical regimen is effective;  continue present plan and medications. 3. Carotid disease: Followed by [facility]. 4. DM: healthy diet and regular exercise.  Follow with PMD.  5. Hyperlipidemia: COntinue atorvastatin.  6. Tobacco abuse: Continue to avoid tobacco.    Current medicines are reviewed  at length with the patient today.  The patient concerns regarding her medicines were addressed.  The following changes have been made:  No change  Labs/ tests ordered today include:  No orders of the defined types were placed in this encounter.   Recommend 150 minutes/week of aerobic exercise Low fat, low carb, high fiber diet recommended  Disposition:   FU in 6 months   Signed, Larae Grooms, MD  03/29/2018 12:17 PM    [facility] [place], [place], [place]  [address] Phone: [phone]; Fax: [phone]

## 2018-03-29 ENCOUNTER — Ambulatory Visit: Payer: PPO | Admitting: Interventional Cardiology

## 2018-03-29 ENCOUNTER — Ambulatory Visit (INDEPENDENT_AMBULATORY_CARE_PROVIDER_SITE_OTHER): Payer: PPO | Admitting: Pharmacist

## 2018-03-29 ENCOUNTER — Encounter: Payer: Self-pay | Admitting: Interventional Cardiology

## 2018-03-29 VITALS — Ht 69.0 in | Wt 183.0 lb

## 2018-03-29 DIAGNOSIS — I4891 Unspecified atrial fibrillation: Secondary | ICD-10-CM | POA: Diagnosis not present

## 2018-03-29 DIAGNOSIS — I6529 Occlusion and stenosis of unspecified carotid artery: Secondary | ICD-10-CM | POA: Diagnosis not present

## 2018-03-29 DIAGNOSIS — I119 Hypertensive heart disease without heart failure: Secondary | ICD-10-CM | POA: Diagnosis not present

## 2018-03-29 DIAGNOSIS — I482 Chronic atrial fibrillation, unspecified: Secondary | ICD-10-CM

## 2018-03-29 DIAGNOSIS — R42 Dizziness and giddiness: Secondary | ICD-10-CM

## 2018-03-29 DIAGNOSIS — Z5181 Encounter for therapeutic drug level monitoring: Secondary | ICD-10-CM

## 2018-03-29 DIAGNOSIS — E782 Mixed hyperlipidemia: Secondary | ICD-10-CM

## 2018-03-29 LAB — POCT INR: INR: 3.1 — AB (ref 2.0–3.0)

## 2018-03-29 NOTE — Patient Instructions (Signed)
Description   Skip warfarin today then resume 1 tablet daily except 1/2 tablet on Mondays, Wednesdays and Fridays.  Continue eating your green leafy intake and eat 2-3 times per week.  Recheck INR 3 weeks.  Call us with new medications or bleeding concerns to [place] @ # [phone] [facility].

## 2018-03-29 NOTE — Patient Instructions (Signed)
Medication Instructions:  Your physician recommends that you continue on your current medications as directed. Please refer to the Current Medication list given to you today.  If you need a refill on your cardiac medications before your next appointment, please call your pharmacy.   Lab work: Your physician recommends that you return for lab work Artist) on the same day as your next coumadin visit   If you have labs (blood work) drawn today and your tests are completely normal, you will receive your results only by: Marland Kitchen MyChart Message (if you have MyChart) OR . A paper copy in the mail If you have any lab test that is abnormal or we need to change your treatment, we will call you to review the results.  Testing/Procedures: None ordered  Follow-Up: At [facility], you and your health needs are our priority.  As part of our continuing mission to provide you with exceptional heart care, we have created designated Provider Care Teams.  These Care Teams include your primary Cardiologist (physician) and Advanced Practice Providers (APPs -  Physician Assistants and Nurse Practitioners) who all work together to provide you with the care you need, when you need it. . You will need a follow up appointment in 6 months.  Please call our office 2 months in advance to schedule this appointment.  You may see Casandra Doffing, MD or one of the following Advanced Practice Providers on your designated Care Team:   . Lyda Jester, PA-C . Dayna Dunn, PA-C . Ermalinda Barrios, PA-C  Any Other Special Instructions Will Be Listed Below (If Applicable).

## 2018-04-04 ENCOUNTER — Encounter

## 2018-04-11 DIAGNOSIS — H35033 Hypertensive retinopathy, bilateral: Secondary | ICD-10-CM | POA: Diagnosis not present

## 2018-04-11 DIAGNOSIS — H26491 Other secondary cataract, right eye: Secondary | ICD-10-CM | POA: Diagnosis not present

## 2018-04-11 DIAGNOSIS — Z961 Presence of intraocular lens: Secondary | ICD-10-CM | POA: Diagnosis not present

## 2018-04-11 DIAGNOSIS — E119 Type 2 diabetes mellitus without complications: Secondary | ICD-10-CM | POA: Diagnosis not present

## 2018-04-16 ENCOUNTER — Other Ambulatory Visit: Payer: PPO

## 2018-05-17 ENCOUNTER — Ambulatory Visit: Payer: PPO

## 2018-05-17 DIAGNOSIS — I4891 Unspecified atrial fibrillation: Secondary | ICD-10-CM | POA: Diagnosis not present

## 2018-05-17 DIAGNOSIS — Z5181 Encounter for therapeutic drug level monitoring: Secondary | ICD-10-CM

## 2018-05-17 LAB — POCT INR: INR: 1.7 — AB (ref 2.0–3.0)

## 2018-05-17 NOTE — Patient Instructions (Signed)
Description   Take 1 tablet today, then resume same dosage 1 tablet daily except 1/2 tablet on Mondays, Wednesdays and Fridays.  Continue eating your green leafy intake and eat 2-3 times per week.  Recheck INR 2 weeks.  Call us with new medications or bleeding concerns to [place] @ # [phone] [facility].

## 2018-06-04 ENCOUNTER — Ambulatory Visit: Payer: PPO | Admitting: Pharmacist

## 2018-06-04 DIAGNOSIS — I4891 Unspecified atrial fibrillation: Secondary | ICD-10-CM

## 2018-06-04 DIAGNOSIS — Z5181 Encounter for therapeutic drug level monitoring: Secondary | ICD-10-CM | POA: Diagnosis not present

## 2018-06-04 LAB — POCT INR: INR: 2.8 (ref 2.0–3.0)

## 2018-06-04 NOTE — Patient Instructions (Signed)
Description   Continue taking 1 tablet daily except 1/2 tablet on Mondays, Wednesdays and Fridays.  Continue eating your green leafy intake and eat 2-3 times per week.  Recheck INR 3 weeks.  Call us with new medications or bleeding concerns to [place] @ # [phone] [facility].

## 2018-06-13 DIAGNOSIS — K529 Noninfective gastroenteritis and colitis, unspecified: Secondary | ICD-10-CM | POA: Diagnosis not present

## 2018-06-25 ENCOUNTER — Ambulatory Visit: Payer: PPO | Admitting: *Deleted

## 2018-06-25 DIAGNOSIS — Z5181 Encounter for therapeutic drug level monitoring: Secondary | ICD-10-CM | POA: Diagnosis not present

## 2018-06-25 DIAGNOSIS — I4891 Unspecified atrial fibrillation: Secondary | ICD-10-CM

## 2018-06-25 LAB — POCT INR: INR: 4.4 — AB (ref 2.0–3.0)

## 2018-06-25 NOTE — Patient Instructions (Signed)
Description   Do not take any Coumadin today and No Coumadin tomorrow then continue taking 1 tablet daily except 1/2 tablet on Mondays, Wednesdays and Fridays.  Continue eating your green leafy intake and eat 2-3 times per week.  Recheck INR 2 weeks.  Call us with new medications or bleeding concerns to [place] @ # [phone] [facility].

## 2018-07-05 ENCOUNTER — Other Ambulatory Visit: Payer: Self-pay

## 2018-07-05 DIAGNOSIS — I6523 Occlusion and stenosis of bilateral carotid arteries: Secondary | ICD-10-CM

## 2018-07-11 ENCOUNTER — Other Ambulatory Visit: Payer: Self-pay

## 2018-07-11 ENCOUNTER — Ambulatory Visit (HOSPITAL_COMMUNITY)
Admission: RE | Admit: 2018-07-11 | Discharge: 2018-07-11 | Disposition: A | Discharge status: Home / Self Care | Payer: PPO | Source: Ambulatory Visit | Attending: Vascular Surgery | Admitting: Vascular Surgery

## 2018-07-11 ENCOUNTER — Encounter: Payer: Self-pay | Admitting: Family

## 2018-07-11 ENCOUNTER — Ambulatory Visit: Payer: PPO | Admitting: Physician Assistant

## 2018-07-11 VITALS — BP 128/73 | HR 94 | Temp 97.7°F | Resp 16 | Ht 69.0 in | Wt 177.0 lb

## 2018-07-11 DIAGNOSIS — I6529 Occlusion and stenosis of unspecified carotid artery: Secondary | ICD-10-CM | POA: Diagnosis not present

## 2018-07-11 DIAGNOSIS — I6523 Occlusion and stenosis of bilateral carotid arteries: Secondary | ICD-10-CM | POA: Diagnosis not present

## 2018-07-11 NOTE — Progress Notes (Signed)
Established Carotid Patient   History of Present Illness   Cynthia Hansen is a 78 y.o. (03-21-1941) female who presents to go over carotid duplex for carotid artery stenosis.  She is status post left carotid endarterectomy by Dr. Amedeo Plenty in 2008.  Prior carotid duplex demonstrates right ICA stenosis of 60 to 79%.  She denies any strokelike symptoms including slurring speech, changes in vision, or one-sided weakness.  She has pain in both legs when walking, when sitting and at rest due to sciatic pain and lumbar spine stenosis per patient.  She denies any classic claudication symptoms.  She has been stressed due to caring for her husband who is currently going through radiation for second occurrence of lung cancer.  She quit smoking in 2018.  She is taking Coumadin for atrial fibrillation.  She is taking Lipitor daily.   Current Outpatient Medications  Medication Sig Dispense Refill  . acetaminophen (TYLENOL) 500 MG tablet Take 1,000 mg by mouth every 6 (six) hours as needed for mild pain (For back pain.).    Marland Kitchen atorvastatin (LIPITOR) 20 MG tablet Take 1 tablet (20 mg total) by mouth daily. 90 tablet 3  . colestipol (COLESTID) 1 g tablet Take 1 g by mouth 2 (two) times daily.    Marland Kitchen diltiazem (CARTIA XT) 180 MG 24 hr capsule TAKE 1 CAPSULE BY MOUTH TWICE DAILY BEFORE MEALS 180 capsule 2  . furosemide (LASIX) 40 MG tablet Take 40 mg by mouth daily.      Marland Kitchen glimepiride (AMARYL) 1 MG tablet Take 1 mg by mouth daily with breakfast.   1  . glimepiride (AMARYL) 2 MG tablet TK 1 T PO BID  1  . HYDROcodone-acetaminophen (NORCO/VICODIN) 5-325 MG tablet Take 1 tablet by mouth every 6 (six) hours as needed for moderate pain or severe pain. Reported on 11/18/2015    . lisinopril (PRINIVIL,ZESTRIL) 5 MG tablet Take 2.5 mg by mouth daily.    . mesalamine (LIALDA) 1.2 g EC tablet Take 4.8 g by mouth daily with breakfast.    . ONE TOUCH ULTRA TEST test strip 1 each by Other route as needed (for BG testing).   5    . potassium chloride SA (K-DUR,KLOR-CON) 20 MEQ tablet TAKE 1 TABLET BY MOUTH 3 DAYS A WEEK 36 tablet 2  . pseudoephedrine-guaifenesin (MUCINEX D) 60-600 MG 12 hr tablet Take 1 tablet by mouth every 12 (twelve) hours.    Marland Kitchen warfarin (COUMADIN) 2.5 MG tablet TAKE BY MOUTH AS DIRECTED BY COUMADIN CLINIC 90 tablet 1   No current facility-administered medications for this visit.      Physical Examination   Vitals:   07/11/18 0941 07/11/18 0945  BP: 125/76 128/73  Pulse: 87 94  Resp: 16   Temp: 97.7 F (36.5 C)   TempSrc: Oral   SpO2: 99%   Weight: 177 lb (80.3 kg)   Height: 5\' 9"  (1.753 m)    Body mass index is 26.14 kg/m.  General Alert, O x 3, WD, NAD  Neck Supple, mid-line trachea,    Pulmonary Sym exp, good B air movt,  Cardiac Irregularly, irregular rate and rhythm  Vascular Vessel Right Left  Radial Palpable Palpable  Brachial Palpable Palpable  Carotid Palpable, Bruit present Palpable, No Bruit  Aorta Not palpable N/A  PT Faintly palpable Faintly palpable    Gastro- intestinal soft, non-distended  Musculo- skeletal M/S 5/5 throughout  , Extremities without ischemic changes    Neurologic Cranial nerves 2-12 intact  Non-Invasive Vascular Imaging   B Carotid Duplex (07/11/18):   R ICA stenosis:  60-79%  R VA:  patent and antegrade  L ICA stenosis:  1-39%  L VA:  patent and antegrade   Medical Decision Making   [name] is a 78 y.o. female with carotid artery stenosis status post left CEA in 2008 returns to clinic to go over carotid duplex   Stable appearing surgical site with minimal recurrent stenosis  Right ICA 60 to 79% stenosis also stable; asymptomatic  No indication for revascularization of right ICA at this time  Recheck carotid duplex in 6 months per protocol  Continue statin daily  Follow-up with PCP for management of other chronic medical conditions   Dagoberto Ligas PA-C [facility] Office: [phone]  Clinic MD: Dr. Oneida Alar

## 2018-07-15 ENCOUNTER — Ambulatory Visit: Payer: PPO | Admitting: *Deleted

## 2018-07-15 DIAGNOSIS — Z5181 Encounter for therapeutic drug level monitoring: Secondary | ICD-10-CM | POA: Diagnosis not present

## 2018-07-15 DIAGNOSIS — I4891 Unspecified atrial fibrillation: Secondary | ICD-10-CM | POA: Diagnosis not present

## 2018-07-15 LAB — POCT INR: INR: 2.1 (ref 2.0–3.0)

## 2018-07-15 NOTE — Patient Instructions (Signed)
Description   Continue taking 1 tablet daily except 1/2 tablet on Mondays, Wednesdays and Fridays.  Continue eating your green leafy intake and eat 2-3 times per week.  Recheck INR 3 weeks.  Call us with new medications or bleeding concerns to [place] @ # [phone] [facility].

## 2018-08-08 ENCOUNTER — Other Ambulatory Visit (INDEPENDENT_AMBULATORY_CARE_PROVIDER_SITE_OTHER): Payer: PPO

## 2018-08-08 DIAGNOSIS — I4891 Unspecified atrial fibrillation: Secondary | ICD-10-CM | POA: Diagnosis not present

## 2018-08-22 ENCOUNTER — Ambulatory Visit (INDEPENDENT_AMBULATORY_CARE_PROVIDER_SITE_OTHER): Payer: PPO | Admitting: Pharmacist

## 2018-08-22 ENCOUNTER — Other Ambulatory Visit: Payer: Self-pay

## 2018-08-22 DIAGNOSIS — Z5181 Encounter for therapeutic drug level monitoring: Secondary | ICD-10-CM | POA: Diagnosis not present

## 2018-08-22 DIAGNOSIS — I4891 Unspecified atrial fibrillation: Secondary | ICD-10-CM | POA: Diagnosis not present

## 2018-08-22 LAB — CBC
Hematocrit: 41.2 % (ref 34.0–46.6)
Hemoglobin: 14 g/dL (ref 11.1–15.9)
MCH: 31.7 pg (ref 26.6–33.0)
MCHC: 34 g/dL (ref 31.5–35.7)
MCV: 93 fL (ref 79–97)
PLATELETS: 320 10*3/uL (ref 150–450)
RBC: 4.42 x10E6/uL (ref 3.77–5.28)
RDW: 13.7 % (ref 11.7–15.4)
WBC: 9.3 10*3/uL (ref 3.4–10.8)

## 2018-08-22 LAB — POCT INR: INR: 2.7 (ref 2.0–3.0)

## 2018-08-22 LAB — COMPREHENSIVE METABOLIC PANEL
A/G RATIO: 1.7 (ref 1.2–2.2)
ALBUMIN: 3.9 g/dL (ref 3.7–4.7)
ALT: 7 IU/L (ref 0–32)
AST: 9 IU/L (ref 0–40)
Alkaline Phosphatase: 81 IU/L (ref 39–117)
BUN/Creatinine Ratio: 17 (ref 12–28)
BUN: 23 mg/dL (ref 8–27)
Bilirubin Total: 0.2 mg/dL (ref 0.0–1.2)
CO2: 25 mmol/L (ref 20–29)
Calcium: 9.6 mg/dL (ref 8.7–10.3)
Chloride: 102 mmol/L (ref 96–106)
Creatinine, Ser: 1.34 mg/dL — ABNORMAL HIGH (ref 0.57–1.00)
GFR calc Af Amer: 44 mL/min/{1.73_m2} — ABNORMAL LOW (ref >59)
GFR calc non Af Amer: 39 mL/min/{1.73_m2} — ABNORMAL LOW (ref >59)
GLOBULIN, TOTAL: 2.3 g/dL (ref 1.5–4.5)
Glucose: 226 mg/dL — ABNORMAL HIGH (ref 65–99)
POTASSIUM: 5.2 mmol/L (ref 3.5–5.2)
SODIUM: 135 mmol/L (ref 134–144)
Total Protein: 6.2 g/dL (ref 6.0–8.5)

## 2018-08-22 MED ORDER — APIXABAN 5 MG PO TABS: 5.0000 mg | ORAL_TABLET | Freq: Two times a day (BID) | ORAL | 0 refills | Status: DC

## 2018-08-22 MED ORDER — APIXABAN 5 MG PO TABS: 5.0000 mg | ORAL_TABLET | Freq: Two times a day (BID) | ORAL | 1 refills | Status: DC

## 2018-08-22 NOTE — Progress Notes (Signed)
Pt was started on Eliquis for afib on 08/22/18.  Reviewed patients medication list.  Pt is not currently on any combined P-gp and strong CYP3A4 inhibitors/inducers (ketoconazole, traconazole, ritonavir, carbamazepine, phenytoin, rifampin, St. John's wort).  Reviewed labs. Age 78, weight 80kg, rechecking CMET and CBC today. Dose appropriate based on age, weight, and SCr.  Hgb and HCT WNL.  A full discussion of the nature of anticoagulants has been carried out.  A benefit/risk analysis has been presented to the patient, so that they understand the justification for choosing anticoagulation with Eliquis at this time.  The need for compliance is stressed.  Pt is aware to take the medication twice daily.  Side effects of potential bleeding are discussed, including unusual colored urine or stools, coughing up blood or coffee ground emesis, nose bleeds or serious fall or head trauma.  Discussed signs and symptoms of stroke. The patient should avoid any OTC items containing aspirin or ibuprofen.  Avoid alcohol consumption.   Call if any signs of abnormal bleeding.  Discussed financial obligations and resolved any difficulty in obtaining medication.  Next lab test in 3 months.

## 2018-08-22 NOTE — Addendum Note (Signed)
Addended by: SUPPLE, MEGAN E on: 08/22/2018 12:48 PM   Modules accepted: Orders

## 2018-08-22 NOTE — Patient Instructions (Signed)
Description   STOP taking warfarin. START taking Eliquis 5mg  twice daily - space your doses apart every 12 hours. Your first dose will be tomorrow morning (3/20). Recheck lab work in 3 months - Tuesday June 23rd any time after 7:30am.

## 2018-09-11 ENCOUNTER — Other Ambulatory Visit: Payer: Self-pay | Admitting: Interventional Cardiology

## 2018-09-12 ENCOUNTER — Other Ambulatory Visit: Payer: Self-pay | Admitting: *Deleted

## 2018-09-12 MED ORDER — ATORVASTATIN CALCIUM 20 MG PO TABS: 20.0000 mg | ORAL_TABLET | Freq: Every day | ORAL | 1 refills | Status: DC

## 2018-09-12 NOTE — Telephone Encounter (Signed)
It looks like refill was sent on 08/22/18 for 90 day supply. pt needs to call pharmacy to refill.

## 2018-09-12 NOTE — Telephone Encounter (Signed)
Spoke with patient she stated she has enough Eliquis until next week.

## 2018-09-13 ENCOUNTER — Telehealth: Payer: Self-pay | Admitting: *Deleted

## 2018-09-13 NOTE — Telephone Encounter (Signed)
Left message on patient's voicemail per [facility], Eliquis 90 days was sent to pharmacy in March, please check with pharmacy.

## 2018-09-14 ENCOUNTER — Other Ambulatory Visit: Payer: Self-pay | Admitting: Interventional Cardiology

## 2018-10-15 ENCOUNTER — Other Ambulatory Visit: Payer: Self-pay | Admitting: Interventional Cardiology

## 2018-10-17 DIAGNOSIS — E559 Vitamin D deficiency, unspecified: Secondary | ICD-10-CM | POA: Diagnosis not present

## 2018-10-17 DIAGNOSIS — E119 Type 2 diabetes mellitus without complications: Secondary | ICD-10-CM | POA: Diagnosis not present

## 2018-10-17 DIAGNOSIS — Z7984 Long term (current) use of oral hypoglycemic drugs: Secondary | ICD-10-CM | POA: Diagnosis not present

## 2018-10-17 DIAGNOSIS — E538 Deficiency of other specified B group vitamins: Secondary | ICD-10-CM | POA: Diagnosis not present

## 2018-10-17 DIAGNOSIS — Z7901 Long term (current) use of anticoagulants: Secondary | ICD-10-CM | POA: Diagnosis not present

## 2018-10-17 DIAGNOSIS — E782 Mixed hyperlipidemia: Secondary | ICD-10-CM | POA: Diagnosis not present

## 2018-10-17 DIAGNOSIS — I10 Essential (primary) hypertension: Secondary | ICD-10-CM | POA: Diagnosis not present

## 2018-11-07 DIAGNOSIS — H00025 Hordeolum internum left lower eyelid: Secondary | ICD-10-CM | POA: Diagnosis not present

## 2018-11-12 DIAGNOSIS — H00025 Hordeolum internum left lower eyelid: Secondary | ICD-10-CM | POA: Diagnosis not present

## 2018-11-25 ENCOUNTER — Other Ambulatory Visit: Payer: Self-pay

## 2018-11-25 ENCOUNTER — Other Ambulatory Visit: Payer: PPO | Admitting: *Deleted

## 2018-11-25 DIAGNOSIS — I4891 Unspecified atrial fibrillation: Secondary | ICD-10-CM | POA: Diagnosis not present

## 2018-11-25 DIAGNOSIS — Z5181 Encounter for therapeutic drug level monitoring: Secondary | ICD-10-CM

## 2018-11-25 LAB — BASIC METABOLIC PANEL
BUN/Creatinine Ratio: 19 (ref 12–28)
BUN: 28 mg/dL — ABNORMAL HIGH (ref 8–27)
CO2: 21 mmol/L (ref 20–29)
Calcium: 9.5 mg/dL (ref 8.7–10.3)
Chloride: 98 mmol/L (ref 96–106)
Creatinine, Ser: 1.44 mg/dL — ABNORMAL HIGH (ref 0.57–1.00)
GFR calc Af Amer: 41 mL/min/{1.73_m2} — ABNORMAL LOW (ref >59)
GFR calc non Af Amer: 35 mL/min/{1.73_m2} — ABNORMAL LOW (ref >59)
Glucose: 205 mg/dL — ABNORMAL HIGH (ref 65–99)
Potassium: 5 mmol/L (ref 3.5–5.2)
Sodium: 136 mmol/L (ref 134–144)

## 2018-11-25 LAB — CBC
Hematocrit: 39.6 % (ref 34.0–46.6)
Hemoglobin: 13.2 g/dL (ref 11.1–15.9)
MCH: 31.4 pg (ref 26.6–33.0)
MCHC: 33.3 g/dL (ref 31.5–35.7)
MCV: 94 fL (ref 79–97)
Platelets: 332 10*3/uL (ref 150–450)
RBC: 4.21 x10E6/uL (ref 3.77–5.28)
RDW: 12.8 % (ref 11.7–15.4)
WBC: 9.5 10*3/uL (ref 3.4–10.8)

## 2018-11-26 ENCOUNTER — Other Ambulatory Visit: Payer: PPO

## 2018-12-26 ENCOUNTER — Other Ambulatory Visit: Payer: Self-pay

## 2018-12-26 ENCOUNTER — Ambulatory Visit: Payer: PPO | Admitting: Cardiology

## 2018-12-26 ENCOUNTER — Encounter: Payer: Self-pay | Admitting: Cardiology

## 2018-12-26 VITALS — BP 122/70 | HR 68 | Ht 69.0 in | Wt 176.0 lb

## 2018-12-26 DIAGNOSIS — I482 Chronic atrial fibrillation, unspecified: Secondary | ICD-10-CM | POA: Diagnosis not present

## 2018-12-26 NOTE — Progress Notes (Signed)
12/26/2018 Cynthia Hansen   [birth year]  [id number]  Primary Physician Leighton Ruff, MD Primary Cardiologist: Larae Grooms, MD  Electrophysiologist: None   Reason for Visit/CC: Routine f/u for Atrial Fibrillation   HPI:  Cynthia Hansen is a 78 y.o. female  who has a history of atrial fibrillation on warfarin. She underwent cardioversion back in 2007. This lasted for about a year but she reverted back to atrial fibrillation. She has been rate controlled since that time. She also has hypertension, hyperlipidemia, DM, smoker(1/2 ppd), and PVD with 50-69% RICA and patent Left carotid endarterectomy 10/22/14 followed by Dr. Oneida Alar. Dr. Irish Lack is her primary cardiologist. Last seen in October 2019. She was doing well from a cardiac standpoint at that time and he recommended 6 month f/u, but due to Covid pandemic, appt was postponed.   Today in f/u, she reports that she has done well. No changes since her last OV. Completely asymptomatic w/ her afib. HR is well controlled. She denies palpitations. No CP, LEE. Reports full med compliance. Some bruising on hands but no other abnormal bleeding. Denies falls. She continues to smoke. Her husband just passed away from lung cancer.    No outpatient medications have been marked as taking for the 12/26/18 encounter (Office Visit) with Consuelo Pandy, PA-C.   Allergies  Allergen Reactions  . Avandia [Rosiglitazone] Other (See Comments)    Causes muscle weakness.   Past Medical History:  Diagnosis Date  . A-fib ([place])   . Carotid artery occlusion   . Diabetes mellitus   . Hyperlipidemia   . Irregular heart beat   . Vitamin D deficiency    Family History  Problem Relation Age of Onset  . Cancer Mother        Uterin  . Heart attack Father   . Hypertension Sister    Past Surgical History:  Procedure Laterality Date  . ABDOMINAL HERNIA REPAIR    . CAROTID ENDARTERECTOMY Left Dec. 3, 2008    cea  . CATARACT EXTRACTION Left    . HYSTEROSCOPY W/D&C N/A 07/30/2014   Procedure: DILATATION AND CURETTAGE /HYSTEROSCOPY;  Surgeon: Osborne Oman, MD;  Location: [facility];  Service: Gynecology;  Laterality: N/A;  . IUD REMOVAL N/A 07/30/2014   Procedure: INTRAUTERINE DEVICE (IUD) REMOVAL;  Surgeon: Osborne Oman, MD;  Location: [facility];  Service: Gynecology;  Laterality: N/A;   Social History   Socioeconomic History  . Marital status: Married    Spouse name: Not on file  . Number of children: Not on file  . Years of education: Not on file  . Highest education level: Not on file  Occupational History  . Not on file  Social Needs  . Financial resource strain: Not on file  . Food insecurity    Worry: Not on file    Inability: Not on file  . Transportation needs    Medical: Not on file    Non-medical: Not on file  Tobacco Use  . Smoking status: Former Smoker    Years: 50.00    Types: Cigarettes    Quit date: 10/05/2016    Years since quitting: 2.2  . Smokeless tobacco: Never Used  Substance and Sexual Activity  . Alcohol use: Yes    Alcohol/week: 0.0 standard drinks  . Drug use: No  . Sexual activity: Not Currently  Lifestyle  . Physical activity    Days per week: Not on file    Minutes per session: Not on file  .  Stress: Not on file  Relationships  . Social Herbalist on phone: Not on file    Gets together: Not on file    Attends religious service: Not on file    Active member of club or organization: Not on file    Attends meetings of clubs or organizations: Not on file    Relationship status: Not on file  . Intimate partner violence    Fear of current or ex partner: Not on file    Emotionally abused: Not on file    Physically abused: Not on file    Forced sexual activity: Not on file  Other Topics Concern  . Not on file  Social History Narrative  . Not on file     Lipid Panel     Component Value Date/Time   CHOL 132 12/25/2017 0737   TRIG 141 12/25/2017 0737   HDL 38 (L)  12/25/2017 0737   CHOLHDL 3.5 12/25/2017 0737   CHOLHDL 2.7 02/23/2016 0749   VLDL 24 02/23/2016 0749   LDLCALC 66 12/25/2017 0737    Review of Systems: General: negative for chills, fever, night sweats or weight changes.  Cardiovascular: negative for chest pain, dyspnea on exertion, edema, orthopnea, palpitations, paroxysmal nocturnal dyspnea or shortness of breath Dermatological: negative for rash Respiratory: negative for cough or wheezing Urologic: negative for hematuria Abdominal: negative for nausea, vomiting, diarrhea, bright red blood per rectum, melena, or hematemesis Neurologic: negative for visual changes, syncope, or dizziness All other systems reviewed and are otherwise negative except as noted above.   Physical Exam:  Blood pressure 122/70, pulse 68, height 5\' 9"  (1.753 m), weight 176 lb (79.8 kg), SpO2 98 %.  General appearance: alert, cooperative and no distress Neck: no carotid bruit and no JVD Lungs: clear to auscultation bilaterally Heart: irregularly irregular rhythm and regular rate Extremities: extremities normal, atraumatic, no cyanosis or edema Pulses: 2+ and symmetric Skin: Skin color, texture, turgor normal. No rashes or lesions Neurologic: Grossly normal  EKG atrial fibrillation w/ CVR 68 bpm no ST abnormalties -- personally reviewed   ASSESSMENT AND PLAN:   1. Atrial Fibrillation: failed attempt at rhythm control. Being treated w/ rate control. Good control w/ cardizem. HR 68 on EKG today. Completely asymptomatic. BP well controlled and stable. She is on Eliquis for a/c. Other than bruising on her hands, no other abnormal bleeding. Recent labs done in June showed normal Hgb at 13.2 and SCr at 1.44. She denies falls.   2. HTN: controlled on current regimen. 122/70 today. On lisinopril, lasix and cardizem. Recent BMP 6/20 stable.  3. Carotid Artery Disease: followed by Dr. Oneida Alar. S/p Left carotid endarterectomy 10/22/14. No symptoms. Scheduled for annual  doppler next month. On ASA and statin.   4. DM: followed/ managed by PCP.   5. HLD: on statin therapy w/ Lipitor. Reports compliance.   6. Tobacco Abuse: still smoking "occasionally". Smoking cessation recommended.  I asked about aid for smoking cessation but not interested at this time.    Follow-Up w/ Dr. Irish Lack in 1 year.   Arienne Gartin Ladoris Gene, MHS [facility] 12/26/2018 9:21 AM

## 2018-12-26 NOTE — Patient Instructions (Signed)
Medication Instructions:  none If you need a refill on your cardiac medications before your next appointment, please call your pharmacy.   Lab work: none If you have labs (blood work) drawn today and your tests are completely normal, you will receive your results only by: Marland Kitchen MyChart Message (if you have MyChart) OR . A paper copy in the mail If you have any lab test that is abnormal or we need to change your treatment, we will call you to review the results.  Testing/Procedures: none  Follow-Up: At [facility], you and your health needs are our priority.  As part of our continuing mission to provide you with exceptional heart care, we have created designated Provider Care Teams.  These Care Teams include your primary Cardiologist (physician) and Advanced Practice Providers (APPs -  Physician Assistants and Nurse Practitioners) who all work together to provide you with the care you need, when you need it. You will need a follow up appointment in 1 years.  Please call our office 2 months in advance to schedule this appointment.  You may see Larae Grooms, MD or one of the following Advanced Practice Providers on your designated Care Team:   [place], PA-C Melina Copa, PA-C . Ermalinda Barrios, PA-C  Any Other Special Instructions Will Be Listed Below (If Applicable).

## 2019-01-06 ENCOUNTER — Other Ambulatory Visit: Payer: Self-pay | Admitting: Interventional Cardiology

## 2019-01-06 DIAGNOSIS — H903 Sensorineural hearing loss, bilateral: Secondary | ICD-10-CM | POA: Diagnosis not present

## 2019-01-06 DIAGNOSIS — Z7984 Long term (current) use of oral hypoglycemic drugs: Secondary | ICD-10-CM | POA: Diagnosis not present

## 2019-01-06 DIAGNOSIS — N183 Chronic kidney disease, stage 3 (moderate): Secondary | ICD-10-CM | POA: Diagnosis not present

## 2019-01-06 DIAGNOSIS — E782 Mixed hyperlipidemia: Secondary | ICD-10-CM | POA: Diagnosis not present

## 2019-01-06 DIAGNOSIS — E559 Vitamin D deficiency, unspecified: Secondary | ICD-10-CM | POA: Diagnosis not present

## 2019-01-06 DIAGNOSIS — E538 Deficiency of other specified B group vitamins: Secondary | ICD-10-CM | POA: Diagnosis not present

## 2019-01-06 DIAGNOSIS — E119 Type 2 diabetes mellitus without complications: Secondary | ICD-10-CM | POA: Diagnosis not present

## 2019-01-06 DIAGNOSIS — D692 Other nonthrombocytopenic purpura: Secondary | ICD-10-CM | POA: Diagnosis not present

## 2019-01-06 DIAGNOSIS — M549 Dorsalgia, unspecified: Secondary | ICD-10-CM | POA: Diagnosis not present

## 2019-01-06 DIAGNOSIS — I10 Essential (primary) hypertension: Secondary | ICD-10-CM | POA: Diagnosis not present

## 2019-01-06 DIAGNOSIS — I809 Phlebitis and thrombophlebitis of unspecified site: Secondary | ICD-10-CM | POA: Diagnosis not present

## 2019-01-06 DIAGNOSIS — Z7901 Long term (current) use of anticoagulants: Secondary | ICD-10-CM | POA: Diagnosis not present

## 2019-01-07 ENCOUNTER — Other Ambulatory Visit: Payer: Self-pay

## 2019-01-07 DIAGNOSIS — I6529 Occlusion and stenosis of unspecified carotid artery: Secondary | ICD-10-CM

## 2019-01-09 ENCOUNTER — Ambulatory Visit (INDEPENDENT_AMBULATORY_CARE_PROVIDER_SITE_OTHER)
Admission: RE | Admit: 2019-01-09 | Discharge: 2019-01-09 | Disposition: A | Discharge status: Home / Self Care | Payer: PPO | Source: Ambulatory Visit | Attending: Family | Admitting: Family

## 2019-01-09 ENCOUNTER — Other Ambulatory Visit (HOSPITAL_COMMUNITY): Payer: Self-pay | Admitting: Family Medicine

## 2019-01-09 ENCOUNTER — Ambulatory Visit (INDEPENDENT_AMBULATORY_CARE_PROVIDER_SITE_OTHER): Payer: PPO | Admitting: Family

## 2019-01-09 ENCOUNTER — Encounter: Payer: Self-pay | Admitting: Family

## 2019-01-09 ENCOUNTER — Ambulatory Visit (HOSPITAL_COMMUNITY)
Admission: RE | Admit: 2019-01-09 | Discharge: 2019-01-09 | Disposition: A | Discharge status: Home / Self Care | Payer: PPO | Source: Ambulatory Visit | Attending: Family | Admitting: Family

## 2019-01-09 ENCOUNTER — Other Ambulatory Visit: Payer: Self-pay

## 2019-01-09 VITALS — BP 138/74 | HR 70 | Temp 97.8°F | Resp 14 | Ht 67.0 in | Wt 175.0 lb

## 2019-01-09 DIAGNOSIS — I8001 Phlebitis and thrombophlebitis of superficial vessels of right lower extremity: Secondary | ICD-10-CM

## 2019-01-09 DIAGNOSIS — I6529 Occlusion and stenosis of unspecified carotid artery: Secondary | ICD-10-CM | POA: Diagnosis not present

## 2019-01-09 DIAGNOSIS — I6523 Occlusion and stenosis of bilateral carotid arteries: Secondary | ICD-10-CM

## 2019-01-09 DIAGNOSIS — Z87891 Personal history of nicotine dependence: Secondary | ICD-10-CM | POA: Diagnosis not present

## 2019-01-09 DIAGNOSIS — Z9889 Other specified postprocedural states: Secondary | ICD-10-CM | POA: Diagnosis not present

## 2019-01-09 NOTE — Patient Instructions (Addendum)
Stroke Prevention Some medical conditions and lifestyle choices can lead to a higher risk for a stroke. You can help to prevent a stroke by making nutrition, lifestyle, and other changes. What nutrition changes can be made?   Eat healthy foods. ? Choose foods that are high in fiber. These include:  Fresh fruits.  Fresh vegetables.  Whole grains. ? Eat at least 5 or more servings of fruits and vegetables each day. Try to fill half of your plate at each meal with fruits and vegetables. ? Choose lean protein foods. These include:  Lowfat (lean) cuts of meat.  Chicken without skin.  Fish.  Tofu.  Beans.  Nuts. ? Eat low-fat dairy products. ? Avoid foods that:  Are high in salt (sodium).  Have saturated fat.  Have trans fat.  Have cholesterol.  Are processed.  Are premade.  Follow eating guidelines as told by your doctor. These may include: ? Reducing how many calories you eat and drink each day. ? Limiting how much salt you eat or drink each day to 1,500 milligrams (mg). ? Using only healthy fats for cooking. These include:  Olive oil.  Canola oil.  Sunflower oil. ? Counting how many carbohydrates you eat and drink each day. What lifestyle changes can be made?  Try to stay at a healthy weight. Talk to your doctor about what a good weight is for you.  Get at least 30 minutes of moderate physical activity at least 5 days a week. This can include: ? Fast walking. ? Biking. ? Swimming.  Do not use any products that have nicotine or tobacco. This includes cigarettes and e-cigarettes. If you need help quitting, ask your doctor. Avoid being around tobacco smoke in general.  Limit how much alcohol you drink to no more than 1 drink a day for nonpregnant women and 2 drinks a day for men. One drink equals 12 oz of beer, 5 oz of wine, or 1 oz of hard liquor.  Do not use drugs.  Avoid taking birth control pills. Talk to your doctor about the risks of taking birth  control pills if: ? You are over 35 years old. ? You smoke. ? You get migraines. ? You have had a blood clot. What other changes can be made?  Manage your cholesterol. ? It is important to eat a healthy diet. ? If your cholesterol cannot be managed through your diet, you may also need to take medicines. Take medicines as told by your doctor.  Manage your diabetes. ? It is important to eat a healthy diet and to exercise regularly. ? If your blood sugar cannot be managed through diet and exercise, you may need to take medicines. Take medicines as told by your doctor.  Control your high blood pressure (hypertension). ? Try to keep your blood pressure below 130/80. This can help lower your risk of stroke. ? It is important to eat a healthy diet and to exercise regularly. ? If your blood pressure cannot be managed through diet and exercise, you may need to take medicines. Take medicines as told by your doctor. ? Ask your doctor if you should check your blood pressure at home. ? Have your blood pressure checked every year. Do this even if your blood pressure is normal.  Talk to your doctor about getting checked for a sleep disorder. Signs of this can include: ? Snoring a lot. ? Feeling very tired.  Take over-the-counter and prescription medicines only as told by your doctor. These may   include aspirin or blood thinners (antiplatelets or anticoagulants).  Make sure that any other medical conditions you have are managed. Where to find more information  American Stroke Association: www.strokeassociation.org  National Stroke Association: www.stroke.org Get help right away if:  You have any symptoms of stroke. "BE FAST" is an easy way to remember the main warning signs: ? B - Balance. Signs are dizziness, sudden trouble walking, or loss of balance. ? E - Eyes. Signs are trouble seeing or a sudden change in how you see. ? F - Face. Signs are sudden weakness or loss of feeling of the face,  or the face or eyelid drooping on one side. ? A - Arms. Signs are weakness or loss of feeling in an arm. This happens suddenly and usually on one side of the body. ? S - Speech. Signs are sudden trouble speaking, slurred speech, or trouble understanding what people say. ? T - Time. Time to call emergency services. Write down what time symptoms started.  You have other signs of stroke, such as: ? A sudden, very bad headache with no known cause. ? Feeling sick to your stomach (nausea). ? Throwing up (vomiting). ? Jerky movements you cannot control (seizure). These symptoms may represent a serious problem that is an emergency. Do not wait to see if the symptoms will go away. Get medical help right away. Call your local emergency services (911 in the [place] Do not drive yourself to the hospital. Summary  You can prevent a stroke by eating healthy, exercising, not smoking, drinking less alcohol, and treating other health problems, such as diabetes, high blood pressure, or high cholesterol.  Do not use any products that contain nicotine or tobacco, such as cigarettes and e-cigarettes.  Get help right away if you have any signs or symptoms of a stroke. This information is not intended to replace advice given to you by your health care provider. Make sure you discuss any questions you have with your health care provider. Document Released: 11/21/2011 Document Revised: 07/18/2018 Document Reviewed: 08/23/2016 Elsevier Patient Education  2020 [employer].     Thrombophlebitis Thrombophlebitis is a condition in which a blood clot forms in a vein. This can happen in your arms or legs, or in the area between your neck and groin (torso). When this condition happens in a vein that is close to the surface of the body (superficial thrombophlebitis), it is usually not serious.However, when the condition happens in a vein that is deep inside the body (deep vein thrombosis, DVT), it can cause serious  problems. What are the causes? This condition may be caused by:  Damage to a vein.  Inflammation of the veins.  A condition that causes blood to clot more easily.  Reduced blood flow through the veins. What increases the risk? The following factors may make you more likely to develop this condition:  Having a condition that makes blood thicker or more likely to clot.  Having an infection.  Having major surgery.  Experiencing a traumatic injury or a broken bone.  Having a catheter in a vein (central line).  Having a condition in which valves in the veins do not work properly, causing blood to collect (pool) in the veins (chronic venous insufficiency).  An inactive (sedentary) lifestyle.  Pregnancy or having recently given birth.  Cancer.  Older age, especially being 30 or older.  Obesity.  Smoking.  Taking medicines that contain estrogen, such as birth control pills.  Having varicose veins.  Using drugs  that are injected into the veins (intravenous, IV). What are the signs or symptoms? The main symptoms of this condition are:  Swelling and pain in an arm or leg. If the affected vein is in the leg, you may feel pain while standing or walking.  Warmth or redness in an arm or leg. Other symptoms include:  Low-grade fever.  Muscle aches.  A bulging vein (venous distension). In some cases, there are no symptoms. How is this diagnosed? This condition may be diagnosed based on:  Your symptoms and medical history.  A physical exam.  Tests, such as: ? Blood tests. ? A test that uses sound waves to make images (ultrasound). How is this treated? Treatment depends on how severe the condition is and which area of the body is affected. Treatment may include:  Applying a warm compress or heating pad to affected areas.  Wearing compression stockings to help prevent blood clots and reduce swelling in your legs.  Raising (elevating) the affected arm or leg above  the level of your heart.  Medicines, such as: ? Anti-inflammatory medicines, such as ibuprofen. ? Blood thinners (anticoagulants), such as heparin. ? Antibiotic medicine, if you have an infection.  Removing an IV that may be causing the problem. In rare cases, surgery may be needed to:  Remove a damaged section of a vein.  Place a filter in a large vein to catch blood clots before they reach the lungs. Follow these instructions at home: Medicines  Take over-the-counter and prescription medicines only as told by your health care provider.  If you were prescribed an antibiotic, take it as told by your health care provider. Do not stop using the antibiotic even if you feel better. Managing pain, stiffness, and swelling   If directed, put heat on the affected area as often as told by your health care provider. Use the heat source that your health care provider recommends, such as a moist heat pack or a heating pad. ? Place a towel between your skin and the heat source. ? Leave the heat on for 20-30 minutes. ? Remove the heat if your skin turns bright red. This is especially important if you are not able to feel pain, heat, or cold. You may have a greater risk of getting burned.  Elevate the affected area above the level of your heart while you are sitting or lying down. Activity  Return to your normal activities as told by your health care provider. Ask your health care provider what activities are safe for you.  Avoid sitting or lying down for long periods. If possible, stand up and walk around regularly. If you are taking blood thinners:  Take your medicine exactly as told, at the same time every day.  Avoid activities that could cause injury or bruising, and follow instructions about how to prevent falls.  Wear a medical alert bracelet or carry a card that lists what medicines you take. General instructions  Drink enough fluid to keep your urine pale yellow.  Wear  compression stockings as told by your health care provider.  Do not use any products that contain nicotine or tobacco, such as cigarettes and e-cigarettes. If you need help quitting, ask your health care provider.  Keep all follow-up visits as told by your health care provider. This is important. Contact a health care provider if:  You miss a dose of your blood thinner, if applicable.  Your symptoms do not improve.  You have unusual bruising.  You have  nausea, vomiting, or diarrhea that lasts for more than one day. Get help right away if:  You have any of these problems: ? New or worse pain, swelling, or redness in an arm or leg. ? Numbness or tingling in an arm or leg. ? Shortness of breath. ? Chest pain. ? Severe pain in your abdomen. ? Fast breathing. ? A fast or irregular heartbeat. ? Blood in your vomit, stool, or urine. ? A severe headache or confusion. ? A cut that does not stop bleeding.  You feel light-headed or dizzy.  You cough up blood.  You have a serious fall or accident, or you hit your head. These symptoms may represent a serious problem that is an emergency. Do not wait to see if the symptoms will go away. Get medical help right away. Call your local emergency services (911 in the [place] Do not drive yourself to the hospital. Summary  Thrombophlebitis is a condition in which a blood clot forms in a vein. This can happen in a vein close to the surface of the body or a vein deep inside the body.  This condition can cause serious problems when it happens in a vein deep inside the body (deep vein thrombosis, DVT).  The main symptom of this condition is swelling and pain around the affected vein.  Treatment may include warm compresses, anti-inflammatory medicines, or blood thinners. This information is not intended to replace advice given to you by your health care provider. Make sure you discuss any questions you have with your health care provider. Document  Released: 11/15/2016 Document Revised: 09/11/2018 Document Reviewed: 11/15/2016 Elsevier Patient Education  2020 [employer].
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
## 2019-01-13 ENCOUNTER — Other Ambulatory Visit: Payer: Self-pay | Admitting: Interventional Cardiology

## 2019-01-14 DIAGNOSIS — H903 Sensorineural hearing loss, bilateral: Secondary | ICD-10-CM | POA: Diagnosis not present

## 2019-02-18 DIAGNOSIS — Z23 Encounter for immunization: Secondary | ICD-10-CM | POA: Diagnosis not present

## 2019-02-18 DIAGNOSIS — E538 Deficiency of other specified B group vitamins: Secondary | ICD-10-CM | POA: Diagnosis not present

## 2019-02-20 DIAGNOSIS — Z85828 Personal history of other malignant neoplasm of skin: Secondary | ICD-10-CM | POA: Diagnosis not present

## 2019-02-20 DIAGNOSIS — Z1283 Encounter for screening for malignant neoplasm of skin: Secondary | ICD-10-CM | POA: Diagnosis not present

## 2019-02-20 DIAGNOSIS — D225 Melanocytic nevi of trunk: Secondary | ICD-10-CM | POA: Diagnosis not present

## 2019-02-20 DIAGNOSIS — Z08 Encounter for follow-up examination after completed treatment for malignant neoplasm: Secondary | ICD-10-CM | POA: Diagnosis not present

## 2019-02-20 DIAGNOSIS — D485 Neoplasm of uncertain behavior of skin: Secondary | ICD-10-CM | POA: Diagnosis not present

## 2019-03-13 ENCOUNTER — Other Ambulatory Visit: Payer: Self-pay

## 2019-03-13 MED ORDER — APIXABAN 5 MG PO TABS: 5.0000 mg | ORAL_TABLET | Freq: Two times a day (BID) | ORAL | 1 refills | Status: DC

## 2019-03-13 NOTE — Telephone Encounter (Signed)
Pt last saw Cynthia Hansen, [place] on 12/26/18, last labs 01/06/19 Creat 1.51, age 78, weight 79.8kg, based on specified criteria pt is on appropriate dosage of Eliquis 5mg  BID.  Will refill rx.

## 2019-04-09 DIAGNOSIS — H43393 Other vitreous opacities, bilateral: Secondary | ICD-10-CM | POA: Diagnosis not present

## 2019-04-09 DIAGNOSIS — Z961 Presence of intraocular lens: Secondary | ICD-10-CM | POA: Diagnosis not present

## 2019-04-09 DIAGNOSIS — E119 Type 2 diabetes mellitus without complications: Secondary | ICD-10-CM | POA: Diagnosis not present

## 2019-04-09 DIAGNOSIS — H35033 Hypertensive retinopathy, bilateral: Secondary | ICD-10-CM | POA: Diagnosis not present

## 2019-04-28 ENCOUNTER — Other Ambulatory Visit: Payer: Self-pay | Admitting: Interventional Cardiology

## 2019-04-28 MED ORDER — ATORVASTATIN CALCIUM 20 MG PO TABS: 20.0000 mg | ORAL_TABLET | Freq: Every day | ORAL | 2 refills | Status: DC

## 2019-07-05 ENCOUNTER — Ambulatory Visit: Payer: PPO

## 2019-07-07 ENCOUNTER — Ambulatory Visit: Payer: PPO

## 2019-07-13 ENCOUNTER — Ambulatory Visit: Payer: PPO

## 2019-07-14 ENCOUNTER — Ambulatory Visit: Payer: PPO | Attending: Internal Medicine

## 2019-07-14 DIAGNOSIS — Z23 Encounter for immunization: Secondary | ICD-10-CM | POA: Insufficient documentation

## 2019-07-14 NOTE — Progress Notes (Signed)
   Covid-19 Vaccination Clinic  Name:  Cynthia Hansen    MRN: [id number] DOB: August 24, 1940  07/14/2019  Cynthia Hansen was observed post Covid-19 immunization for 15 minutes without incidence. She was provided with Vaccine Information Sheet and instruction to access the V-Safe system.   Cynthia Hansen was instructed to call 911 with any severe reactions post vaccine: Marland Kitchen Difficulty breathing  . Swelling of your face and throat  . A fast heartbeat  . A bad rash all over your body  . Dizziness and weakness    Immunizations Administered    Name Date Dose VIS Date Route   Pfizer COVID-19 Vaccine 07/14/2019  3:20 PM 0.3 mL 05/16/2019 Intramuscular   Manufacturer: [place]   Lot: [id number]   [place]: [id number]

## 2019-07-29 DIAGNOSIS — N183 Chronic kidney disease, stage 3 unspecified: Secondary | ICD-10-CM | POA: Diagnosis not present

## 2019-07-29 DIAGNOSIS — E538 Deficiency of other specified B group vitamins: Secondary | ICD-10-CM | POA: Diagnosis not present

## 2019-07-29 DIAGNOSIS — R350 Frequency of micturition: Secondary | ICD-10-CM | POA: Diagnosis not present

## 2019-07-29 DIAGNOSIS — Z7984 Long term (current) use of oral hypoglycemic drugs: Secondary | ICD-10-CM | POA: Diagnosis not present

## 2019-07-29 DIAGNOSIS — E559 Vitamin D deficiency, unspecified: Secondary | ICD-10-CM | POA: Diagnosis not present

## 2019-07-29 DIAGNOSIS — M549 Dorsalgia, unspecified: Secondary | ICD-10-CM | POA: Diagnosis not present

## 2019-07-29 DIAGNOSIS — E119 Type 2 diabetes mellitus without complications: Secondary | ICD-10-CM | POA: Diagnosis not present

## 2019-07-29 DIAGNOSIS — E782 Mixed hyperlipidemia: Secondary | ICD-10-CM | POA: Diagnosis not present

## 2019-08-01 DIAGNOSIS — H35033 Hypertensive retinopathy, bilateral: Secondary | ICD-10-CM | POA: Diagnosis not present

## 2019-08-01 DIAGNOSIS — E878 Other disorders of electrolyte and fluid balance, not elsewhere classified: Secondary | ICD-10-CM | POA: Diagnosis not present

## 2019-08-01 DIAGNOSIS — E119 Type 2 diabetes mellitus without complications: Secondary | ICD-10-CM | POA: Diagnosis not present

## 2019-08-01 DIAGNOSIS — I10 Essential (primary) hypertension: Secondary | ICD-10-CM | POA: Diagnosis not present

## 2019-08-01 DIAGNOSIS — I4891 Unspecified atrial fibrillation: Secondary | ICD-10-CM | POA: Diagnosis not present

## 2019-08-01 DIAGNOSIS — E559 Vitamin D deficiency, unspecified: Secondary | ICD-10-CM | POA: Diagnosis not present

## 2019-08-01 DIAGNOSIS — E1165 Type 2 diabetes mellitus with hyperglycemia: Secondary | ICD-10-CM | POA: Diagnosis not present

## 2019-08-01 DIAGNOSIS — E782 Mixed hyperlipidemia: Secondary | ICD-10-CM | POA: Diagnosis not present

## 2019-08-01 DIAGNOSIS — N183 Chronic kidney disease, stage 3 unspecified: Secondary | ICD-10-CM | POA: Diagnosis not present

## 2019-08-06 DIAGNOSIS — M5416 Radiculopathy, lumbar region: Secondary | ICD-10-CM | POA: Diagnosis not present

## 2019-08-06 DIAGNOSIS — M533 Sacrococcygeal disorders, not elsewhere classified: Secondary | ICD-10-CM | POA: Diagnosis not present

## 2019-08-06 DIAGNOSIS — M5136 Other intervertebral disc degeneration, lumbar region: Secondary | ICD-10-CM | POA: Diagnosis not present

## 2019-08-08 ENCOUNTER — Ambulatory Visit: Payer: PPO | Attending: Internal Medicine

## 2019-08-08 DIAGNOSIS — Z23 Encounter for immunization: Secondary | ICD-10-CM | POA: Insufficient documentation

## 2019-08-08 NOTE — Progress Notes (Signed)
   Covid-19 Vaccination Clinic  Name:  Cynthia Hansen    MRN: [id number] DOB: 1940-08-06  08/08/2019  [name] was observed post Covid-19 immunization for 15 minutes without incident. She was provided with Vaccine Information Sheet and instruction to access the V-Safe system.   [name] was instructed to call 911 with any severe reactions post vaccine: Marland Kitchen Difficulty breathing  . Swelling of face and throat  . A fast heartbeat  . A bad rash all over body  . Dizziness and weakness   Immunizations Administered    Name Date Dose VIS Date Route   Pfizer COVID-19 Vaccine 08/08/2019 11:24 AM 0.3 mL 05/16/2019 Intramuscular   Manufacturer: [place]   Lot: [id number]   [place]: [id number]

## 2019-08-12 ENCOUNTER — Other Ambulatory Visit: Payer: Self-pay | Admitting: *Deleted

## 2019-08-12 DIAGNOSIS — I6523 Occlusion and stenosis of bilateral carotid arteries: Secondary | ICD-10-CM

## 2019-08-12 NOTE — Progress Notes (Signed)
HISTORY AND PHYSICAL     CC:  follow up. Requesting Provider:  Leighton Ruff, MD  HPI: This is a 79 y.o. female here for follow up for carotid artery stenosis.  Pt is s/p left CEA by Dr. Amedeo Plenty in 2008.  Pt has been followed by Dr. Oneida Alar.   Pt was last seen August 2020 & was asymptomatic.    Pt has hx of back issues in lumbar spine that causes low back pain and leg weakness.  She did not have claudication at her last visit.    She has hx of more than 60lb intentional weight loss at last visit over 3 years.   She comes in today for her follow up visit.  She states she has been doing well health wise, but it has been a hard year as her husband passed away a year ago tomorrow and her brother died later in the year.  She has had some kitchen issues with leaks.    She denies any amaurosis fugax, speech difficulties.  She states that about a month ago, she was reading holding a book, and she could not move her left hand for about 10 seconds and it resolved.  This has not happened before or since.    Pt scheduled for epidural steroid injection next Friday, 08/22/2019 at the surgery center.  The pt is on a statin for cholesterol management.  The pt is not on a daily aspirin.   Other AC:  Eliquis for afib The pt is on CCB, ACEI for hypertension.   The pt is diabetic.   Tobacco hx:  Former-quit in 2018   Past Medical History:  Diagnosis Date  . A-fib ([place])   . Carotid artery occlusion   . Diabetes mellitus   . Hyperlipidemia   . Irregular heart beat   . Vitamin D deficiency     Past Surgical History:  Procedure Laterality Date  . ABDOMINAL HERNIA REPAIR    . CAROTID ENDARTERECTOMY Left Dec. 3, 2008    cea  . CATARACT EXTRACTION Left   . HYSTEROSCOPY WITH D & C N/A 07/30/2014   Procedure: DILATATION AND CURETTAGE /HYSTEROSCOPY;  Surgeon: Osborne Oman, MD;  Location: [facility];  Service: Gynecology;  Laterality: N/A;  . IUD REMOVAL N/A 07/30/2014   Procedure: INTRAUTERINE DEVICE  (IUD) REMOVAL;  Surgeon: Osborne Oman, MD;  Location: [facility];  Service: Gynecology;  Laterality: N/A;    Allergies  Allergen Reactions  . Avandia [Rosiglitazone] Other (See Comments)    Causes muscle weakness.    Current Outpatient Medications  Medication Sig Dispense Refill  . acetaminophen (TYLENOL) 500 MG tablet Take 1,000 mg by mouth every 6 (six) hours as needed for mild pain (For back pain.).    Marland Kitchen apixaban (ELIQUIS) 5 MG TABS tablet Take 1 tablet (5 mg total) by mouth 2 (two) times daily. 180 tablet 1  . atorvastatin (LIPITOR) 20 MG tablet Take 1 tablet (20 mg total) by mouth daily. 90 tablet 2  . colestipol (COLESTID) 1 g tablet Take 1 g by mouth 2 (two) times daily.    Marland Kitchen diltiazem (CARDIZEM CD) 180 MG 24 hr capsule TAKE 1 CAPSULE BY MOUTH TWICE DAILY BEFORE MEALS 180 capsule 3  . furosemide (LASIX) 40 MG tablet Take 40 mg by mouth daily.      Marland Kitchen glimepiride (AMARYL) 2 MG tablet TK 1 T PO BID  1  . HYDROcodone-acetaminophen (NORCO/VICODIN) 5-325 MG tablet Take 1 tablet by mouth every 6 (six)  hours as needed for moderate pain or severe pain. Reported on 11/18/2015    . lisinopril (PRINIVIL,ZESTRIL) 5 MG tablet Take 2.5 mg by mouth daily.    . mesalamine (LIALDA) 1.2 g EC tablet Take 4.8 g by mouth daily with breakfast.    . ONE TOUCH ULTRA TEST test strip 1 each by Other route as needed (for BG testing).   5  . potassium chloride SA (K-DUR) 20 MEQ tablet TAKE 1 TABLET BY MOUTH 3 DAYS A WEEK 36 tablet 3  . pseudoephedrine-guaifenesin (MUCINEX D) 60-600 MG 12 hr tablet Take 1 tablet by mouth every 12 (twelve) hours.     No current facility-administered medications for this visit.    Family History  Problem Relation Age of Onset  . Cancer Mother        Uterin  . Heart attack Father   . Hypertension Sister     Social History   Socioeconomic History  . Marital status: Married    Spouse name: Not on file  . Number of children: Not on file  . Years of education: Not on file   . Highest education level: Not on file  Occupational History  . Not on file  Tobacco Use  . Smoking status: Former Smoker    Years: 50.00    Types: Cigarettes    Quit date: 10/05/2016    Years since quitting: 2.8  . Smokeless tobacco: Never Used  Substance and Sexual Activity  . Alcohol use: Yes    Alcohol/week: 0.0 standard drinks  . Drug use: No  . Sexual activity: Not Currently  Other Topics Concern  . Not on file  Social History Narrative  . Not on file   Social Determinants of Health   Financial Resource Strain:   . Difficulty of Paying Living Expenses: Not on file  Food Insecurity:   . Worried About Charity fundraiser in the Last Year: Not on file  . Ran Out of Food in the Last Year: Not on file  Transportation Needs:   . Lack of Transportation (Medical): Not on file  . Lack of Transportation (Non-Medical): Not on file  Physical Activity:   . Days of Exercise per Week: Not on file  . Minutes of Exercise per Session: Not on file  Stress:   . Feeling of Stress : Not on file  Social Connections:   . Frequency of Communication with Friends and Family: Not on file  . Frequency of Social Gatherings with Friends and Family: Not on file  . Attends Religious Services: Not on file  . Active Member of Clubs or Organizations: Not on file  . Attends Archivist Meetings: Not on file  . Marital Status: Not on file  Intimate Partner Violence:   . Fear of Current or Ex-Partner: Not on file  . Emotionally Abused: Not on file  . Physically Abused: Not on file  . Sexually Abused: Not on file     REVIEW OF SYSTEMS:   [X]  denotes positive finding, [ ]  denotes negative finding Cardiac  Comments:  Chest pain or chest pressure:    Shortness of breath upon exertion: x   Short of breath when lying flat:    Irregular heart rhythm: x       Vascular    Pain in calf, thigh, or hip brought on by ambulation:    Pain in feet at night that wakes you up from your sleep:       Blood clot in  your veins:    Leg swelling:         Pulmonary    Oxygen at home:    Productive cough:  x   Wheezing:         Neurologic    Sudden weakness in arms or legs:  x See HPI  Sudden numbness in arms or legs:     Sudden onset of difficulty speaking or slurred speech:    Temporary loss of vision in one eye:     Problems with dizziness:         Gastrointestinal    Blood in stool:     Vomited blood:         Genitourinary    Burning when urinating:  x   Blood in urine:        Psychiatric    Major depression:         Hematologic    Bleeding problems:    Problems with blood clotting too easily:        Skin    Rashes or ulcers:        Constitutional    Fever or chills:      PHYSICAL EXAMINATION:  Today's Vitals   08/14/19 0837 08/14/19 0844  BP: 124/71 132/73  Pulse: 71 80  Resp: 16   Temp: 97.7 F (36.5 C)   TempSrc: Temporal   SpO2: 100%   Weight: 179 lb (81.2 kg)   Height: 5\' 9"  (1.753 m)    Body mass index is 26.43 kg/m.   General:  WDWN in NAD; vital signs documented above Gait: Not observed HENT: WNL, normocephalic Pulmonary: normal non-labored breathing , without Rales, rhonchi,  wheezing Cardiac: irregular HR, without  Murmurs, rubs or gallops; with carotid bruit on the right Abdomen: soft, NT, no masses Skin: without rashes, there are small areas of ecchymosis on both arms due to anticoagulation Vascular Exam/Pulses:  Right Left  Radial 2+ (normal) 2+ (normal)  DP 2+ (normal) 2+ (normal)  PT Unable to palpate  Unable to palpate    Extremities: without ischemic changes, without Gangrene , without cellulitis; without open wounds;  Musculoskeletal: no muscle wasting or atrophy  Neurologic: A&O X 3; hand grips are 5/5 bilaterally; motor and sensation are in tact.  Psychiatric:  The pt has Normal affect.   Non-Invasive Vascular Imaging:   Carotid Duplex on 08/14/2019: Right:  60-79% ICA stenosis Left:  1-39% ICA stenosis Vertebrals:  Bilateral vertebral arteries demonstrate antegrade flow.  Subclavians: Normal flow hemodynamics were seen in bilateral subclavian arteries.   Previous Carotid duplex on 01/09/2019: Right: 60-79% ICA stenosis Left:   1-39% ICA stenosis Vertebrals: Right vertebral artery demonstrates antegrade flow. Left  vertebral  artery demonstrates no discernable flow.  Subclavians: Normal flow hemodynamics were seen in bilateral subclavian  arteries.   ASSESSMENT/PLAN:: 79 y.o. female here for follow up carotid artery stenosis.  Pt is s/p left CEA by Dr. Amedeo Plenty in 2008.  Pt is now followed by Dr. Oneida Alar.  -pt carotid duplex unchanged, however, she did have an episode of not being able to move her left hand about a month ago.  This lasted ~ 10 seconds.  On duplex, she does have 60-79% ICA stenosis on the right and could have possibly had TIA.  She does have hx of afib and is on Eliquis.   Discussed with Dr. Oneida Alar and will send pt for CTA of head/neck to evaluate and pt will see Dr. Oneida Alar back next week.  -discussed s/s  of stroke with pt and they understand should they develop any of these sx, they will go to the nearest ER. -continue statin.  Pt is not on asa due to being on Eliquis for afib.   Leontine Locket, PA-C [facility] [phone]  Clinic MD:  Oneida Alar

## 2019-08-14 ENCOUNTER — Ambulatory Visit (HOSPITAL_COMMUNITY)
Admission: RE | Admit: 2019-08-14 | Discharge: 2019-08-14 | Disposition: A | Discharge status: Home / Self Care | Payer: PPO | Source: Ambulatory Visit | Attending: Vascular Surgery | Admitting: Vascular Surgery

## 2019-08-14 ENCOUNTER — Other Ambulatory Visit: Payer: Self-pay

## 2019-08-14 ENCOUNTER — Ambulatory Visit: Payer: PPO | Admitting: Physician Assistant

## 2019-08-14 VITALS — BP 132/73 | HR 80 | Temp 97.7°F | Resp 16 | Ht 69.0 in | Wt 179.0 lb

## 2019-08-14 DIAGNOSIS — I6523 Occlusion and stenosis of bilateral carotid arteries: Secondary | ICD-10-CM

## 2019-08-18 ENCOUNTER — Other Ambulatory Visit: Payer: Self-pay

## 2019-08-18 DIAGNOSIS — I6523 Occlusion and stenosis of bilateral carotid arteries: Secondary | ICD-10-CM

## 2019-08-19 ENCOUNTER — Ambulatory Visit (HOSPITAL_COMMUNITY)
Admission: RE | Admit: 2019-08-19 | Discharge: 2019-08-19 | Disposition: A | Discharge status: Home / Self Care | Payer: PPO | Source: Ambulatory Visit | Attending: Physician Assistant | Admitting: Physician Assistant

## 2019-08-19 ENCOUNTER — Other Ambulatory Visit: Payer: Self-pay

## 2019-08-19 ENCOUNTER — Encounter (HOSPITAL_COMMUNITY): Payer: Self-pay

## 2019-08-19 DIAGNOSIS — I6523 Occlusion and stenosis of bilateral carotid arteries: Secondary | ICD-10-CM | POA: Insufficient documentation

## 2019-08-19 LAB — POCT I-STAT CREATININE: Creatinine, Ser: 1.8 mg/dL — ABNORMAL HIGH (ref 0.44–1.00)

## 2019-08-21 ENCOUNTER — Ambulatory Visit: Payer: PPO | Admitting: Vascular Surgery

## 2019-08-22 DIAGNOSIS — M5416 Radiculopathy, lumbar region: Secondary | ICD-10-CM | POA: Diagnosis not present

## 2019-08-28 DIAGNOSIS — E860 Dehydration: Secondary | ICD-10-CM | POA: Diagnosis not present

## 2019-09-04 ENCOUNTER — Other Ambulatory Visit: Payer: Self-pay

## 2019-09-04 ENCOUNTER — Encounter: Payer: Self-pay | Admitting: Vascular Surgery

## 2019-09-04 ENCOUNTER — Ambulatory Visit (INDEPENDENT_AMBULATORY_CARE_PROVIDER_SITE_OTHER): Payer: PPO | Admitting: Vascular Surgery

## 2019-09-04 ENCOUNTER — Ambulatory Visit: Payer: PPO | Admitting: Vascular Surgery

## 2019-09-04 VITALS — BP 127/65 | HR 70 | Temp 97.6°F | Resp 18 | Ht 69.0 in | Wt 186.0 lb

## 2019-09-04 DIAGNOSIS — I6523 Occlusion and stenosis of bilateral carotid arteries: Secondary | ICD-10-CM | POA: Diagnosis not present

## 2019-09-04 NOTE — Progress Notes (Signed)
Patient is a 79 year old female who returns for follow-up today.  Had a previous left carotid endarterectomy by Dr. Amedeo Plenty in 2008.  We have been following her for moderate right carotid stenosis.  She was recently seen by one of our PAs and had an episode of weakness in her left hand that lasted about 5 to 10 seconds.  We were going to send her for a CT angiogram of the neck to further define the stenosis in the right side.  However, she was unable to have this done due to elevated creatinine.  She is here today for further discussion.  She is on Eliquis and a statin.  She does have chronic atrial fibrillation.  She describes an event where she was reading a book and holding the book in her left hand.  She said that for about 5 to 10 seconds after holding the book the hand was very stiff and she was unable to move it.  Her husband had multiple TIAs and strokes in the past.  She states that she is not sure that this event was similar to that.  She has never had an additional event prior to or after that.  She has never had a stroke.  Other chronic medical problems include elevated cholesterol and diabetes.  These have both been stable.  She quit smoking 2018.  Social History   Socioeconomic History  . Marital status: Married    Spouse name: Not on file  . Number of children: Not on file  . Years of education: Not on file  . Highest education level: Not on file  Occupational History  . Not on file  Tobacco Use  . Smoking status: Former Smoker    Years: 50.00    Types: Cigarettes    Quit date: 10/05/2016    Years since quitting: 2.9  . Smokeless tobacco: Never Used  Substance and Sexual Activity  . Alcohol use: Yes    Alcohol/week: 0.0 standard drinks  . Drug use: No  . Sexual activity: Not Currently  Other Topics Concern  . Not on file  Social History Narrative  . Not on file   Social Determinants of Health   Financial Resource Strain:   . Difficulty of Paying Living Expenses:   Food  Insecurity:   . Worried About Charity fundraiser in the Last Year:   . Arboriculturist in the Last Year:   Transportation Needs:   . Film/video editor (Medical):   Marland Kitchen Lack of Transportation (Non-Medical):   Physical Activity:   . Days of Exercise per Week:   . Minutes of Exercise per Session:   Stress:   . Feeling of Stress :   Social Connections:   . Frequency of Communication with Friends and Family:   . Frequency of Social Gatherings with Friends and Family:   . Attends Religious Services:   . Active Member of Clubs or Organizations:   . Attends Archivist Meetings:   Marland Kitchen Marital Status:   Intimate Partner Violence:   . Fear of Current or Ex-Partner:   . Emotionally Abused:   Marland Kitchen Physically Abused:   . Sexually Abused:     Past Medical History:  Diagnosis Date  . A-fib ([place])   . Carotid artery occlusion   . Diabetes mellitus   . Hyperlipidemia   . Irregular heart beat   . Vitamin D deficiency     Past Surgical History:  Procedure Laterality Date  . ABDOMINAL HERNIA  REPAIR    . CAROTID ENDARTERECTOMY Left Dec. 3, 2008    cea  . CATARACT EXTRACTION Left   . HYSTEROSCOPY WITH D & C N/A 07/30/2014   Procedure: DILATATION AND CURETTAGE /HYSTEROSCOPY;  Surgeon: Osborne Oman, MD;  Location: [facility];  Service: Gynecology;  Laterality: N/A;  . IUD REMOVAL N/A 07/30/2014   Procedure: INTRAUTERINE DEVICE (IUD) REMOVAL;  Surgeon: Osborne Oman, MD;  Location: [facility];  Service: Gynecology;  Laterality: N/A;    Current Outpatient Medications on File Prior to Visit  Medication Sig Dispense Refill  . acetaminophen (TYLENOL) 500 MG tablet Take 1,000 mg by mouth every 6 (six) hours as needed for mild pain (For back pain.).    Marland Kitchen apixaban (ELIQUIS) 5 MG TABS tablet Take 1 tablet (5 mg total) by mouth 2 (two) times daily. 180 tablet 1  . atorvastatin (LIPITOR) 20 MG tablet Take 1 tablet (20 mg total) by mouth daily. 90 tablet 2  . colestipol (COLESTID) 1 g tablet Take  1 g by mouth 2 (two) times daily.    Marland Kitchen diltiazem (CARDIZEM CD) 180 MG 24 hr capsule TAKE 1 CAPSULE BY MOUTH TWICE DAILY BEFORE MEALS 180 capsule 3  . fexofenadine (ALLEGRA) 180 MG tablet Take 180 mg by mouth daily.    . furosemide (LASIX) 40 MG tablet Take 40 mg by mouth daily.      Marland Kitchen glimepiride (AMARYL) 2 MG tablet TK 1 T PO BID  1  . lisinopril (PRINIVIL,ZESTRIL) 5 MG tablet Take 2.5 mg by mouth daily.    . mesalamine (LIALDA) 1.2 g EC tablet Take 4.8 g by mouth daily with breakfast.    . ONE TOUCH ULTRA TEST test strip 1 each by Other route as needed (for BG testing).   5  . potassium chloride SA (K-DUR) 20 MEQ tablet TAKE 1 TABLET BY MOUTH 3 DAYS A WEEK 36 tablet 3  . pseudoephedrine-guaifenesin (MUCINEX D) 60-600 MG 12 hr tablet Take 1 tablet by mouth every 12 (twelve) hours.    . Vitamin D, Ergocalciferol, (DRISDOL) 1.25 MG (50000 UNIT) CAPS capsule Take 50,000 Units by mouth once a week.    Marland Kitchen HYDROcodone-acetaminophen (NORCO/VICODIN) 5-325 MG tablet Take 1 tablet by mouth every 6 (six) hours as needed for moderate pain or severe pain. Reported on 11/18/2015     No current facility-administered medications on file prior to visit.   Physical exam:  Vitals:   09/04/19 1113 09/04/19 1116  BP: 120/61 127/65  Pulse: 70   Resp: 18   Temp: 97.6 F (36.4 C)   SpO2: 98%   Weight: 186 lb (84.4 kg)   Height: 5\' 9"  (1.753 m)     Neck: 2+ carotid pulses  Neuro: Symmetric upper extremity lower extremity motor strength 5/5 no facial asymmetry  Data: I reviewed the patient's carotid duplex exam from January 09, 2019.  This showed a peak systolic velocity of 027.  Her most recent duplex exam a few weeks ago showed very similar findings.  This was consistent with a 60 to 80% carotid stenosis which has been similar for several years.  Assessment: Difficult to know what happened with her left hand but this does not really seem to be a TIA event and the patient is in agreement with this as well.  I  am inclined to observe this for now.  If she had an additional similar event then we would certainly consider carotid endarterectomy.  Otherwise, she will have a follow-up carotid duplex  scan in 6 months time and continue her current medical management with her atrial fibrillation and also her carotid disease with Eliquis and her statin.  Plan: See above.  Patient will have a follow-up carotid duplex scan and see me in 6 months time.  Ruta Hinds, MD [facility] Office: [phone]

## 2019-09-08 ENCOUNTER — Other Ambulatory Visit: Payer: Self-pay | Admitting: *Deleted

## 2019-09-08 DIAGNOSIS — I6523 Occlusion and stenosis of bilateral carotid arteries: Secondary | ICD-10-CM

## 2019-09-08 DIAGNOSIS — Z87440 Personal history of urinary (tract) infections: Secondary | ICD-10-CM | POA: Diagnosis not present

## 2019-09-08 DIAGNOSIS — E878 Other disorders of electrolyte and fluid balance, not elsewhere classified: Secondary | ICD-10-CM | POA: Diagnosis not present

## 2019-09-18 DIAGNOSIS — K589 Irritable bowel syndrome without diarrhea: Secondary | ICD-10-CM | POA: Diagnosis not present

## 2019-09-18 DIAGNOSIS — K644 Residual hemorrhoidal skin tags: Secondary | ICD-10-CM | POA: Diagnosis not present

## 2019-09-18 DIAGNOSIS — K529 Noninfective gastroenteritis and colitis, unspecified: Secondary | ICD-10-CM | POA: Diagnosis not present

## 2019-09-26 DIAGNOSIS — E782 Mixed hyperlipidemia: Secondary | ICD-10-CM | POA: Diagnosis not present

## 2019-09-26 DIAGNOSIS — E1165 Type 2 diabetes mellitus with hyperglycemia: Secondary | ICD-10-CM | POA: Diagnosis not present

## 2019-09-26 DIAGNOSIS — N183 Chronic kidney disease, stage 3 unspecified: Secondary | ICD-10-CM | POA: Diagnosis not present

## 2019-09-26 DIAGNOSIS — H35033 Hypertensive retinopathy, bilateral: Secondary | ICD-10-CM | POA: Diagnosis not present

## 2019-09-26 DIAGNOSIS — I4891 Unspecified atrial fibrillation: Secondary | ICD-10-CM | POA: Diagnosis not present

## 2019-09-26 DIAGNOSIS — E119 Type 2 diabetes mellitus without complications: Secondary | ICD-10-CM | POA: Diagnosis not present

## 2019-09-26 DIAGNOSIS — I10 Essential (primary) hypertension: Secondary | ICD-10-CM | POA: Diagnosis not present

## 2019-10-29 DIAGNOSIS — E1165 Type 2 diabetes mellitus with hyperglycemia: Secondary | ICD-10-CM | POA: Diagnosis not present

## 2019-10-29 DIAGNOSIS — E860 Dehydration: Secondary | ICD-10-CM | POA: Diagnosis not present

## 2019-10-29 DIAGNOSIS — Z87440 Personal history of urinary (tract) infections: Secondary | ICD-10-CM | POA: Diagnosis not present

## 2019-10-29 DIAGNOSIS — E559 Vitamin D deficiency, unspecified: Secondary | ICD-10-CM | POA: Diagnosis not present

## 2019-10-29 DIAGNOSIS — E878 Other disorders of electrolyte and fluid balance, not elsewhere classified: Secondary | ICD-10-CM | POA: Diagnosis not present

## 2019-10-31 DIAGNOSIS — E782 Mixed hyperlipidemia: Secondary | ICD-10-CM | POA: Diagnosis not present

## 2019-10-31 DIAGNOSIS — H35033 Hypertensive retinopathy, bilateral: Secondary | ICD-10-CM | POA: Diagnosis not present

## 2019-10-31 DIAGNOSIS — I4891 Unspecified atrial fibrillation: Secondary | ICD-10-CM | POA: Diagnosis not present

## 2019-10-31 DIAGNOSIS — E1165 Type 2 diabetes mellitus with hyperglycemia: Secondary | ICD-10-CM | POA: Diagnosis not present

## 2019-10-31 DIAGNOSIS — E119 Type 2 diabetes mellitus without complications: Secondary | ICD-10-CM | POA: Diagnosis not present

## 2019-10-31 DIAGNOSIS — N183 Chronic kidney disease, stage 3 unspecified: Secondary | ICD-10-CM | POA: Diagnosis not present

## 2019-10-31 DIAGNOSIS — I10 Essential (primary) hypertension: Secondary | ICD-10-CM | POA: Diagnosis not present

## 2019-10-31 DIAGNOSIS — N189 Chronic kidney disease, unspecified: Secondary | ICD-10-CM | POA: Diagnosis not present

## 2019-11-06 DIAGNOSIS — N183 Chronic kidney disease, stage 3 unspecified: Secondary | ICD-10-CM | POA: Diagnosis not present

## 2019-11-07 DIAGNOSIS — R3915 Urgency of urination: Secondary | ICD-10-CM | POA: Diagnosis not present

## 2019-12-04 ENCOUNTER — Other Ambulatory Visit: Payer: Self-pay

## 2019-12-04 MED ORDER — APIXABAN 5 MG PO TABS: 5.0000 mg | ORAL_TABLET | Freq: Two times a day (BID) | ORAL | 1 refills | Status: DC

## 2019-12-04 NOTE — Telephone Encounter (Signed)
Prescription refill request for Eliquis received.  Last office visit: Cynthia Hansen 12/26/2018 Scr: 1.78, 10/29/2019 Age: 79 y.o.  Weight: 84.4 kg   Prescription refill sent.

## 2019-12-05 ENCOUNTER — Telehealth: Payer: Self-pay | Admitting: Interventional Cardiology

## 2019-12-05 NOTE — Telephone Encounter (Signed)
Patient is calling about her apixaban (ELIQUIS) 5 MG TABS tablet, she went to pick it up yesterday, but it was going to cost her over $300 for a 3 mos supply.  She can't not afford that on a fixed income.  She wants to know if there is something else she can take that is not so expensive.

## 2019-12-05 NOTE — Telephone Encounter (Signed)
Called and spoke to patient. She states that her Eliquis is $300 for a 90 day supply. It had been $90 for a 3 month supply but she is in the donut hole. She states that since her husband passed she is on a fixed income and cannot afford to pay that. She states that she would be willing to switch back to coumadin if needed. She states that she currently has a 2 week supply of Eliquis left. Made patient aware that I will forward to PharmD and Dr. Irish Lack for review and recommendation.

## 2019-12-09 NOTE — Telephone Encounter (Signed)
Xarelto wouldn't be any cheaper. Only other option would be to apply for BMS pt assistance, but she would have had to spend 3% of income on Rx expenses. Will reach out to patient about switching over.

## 2019-12-09 NOTE — Telephone Encounter (Signed)
I am ok with switching to Coumadin unless we have another way of switching to Xarelto.    JV

## 2019-12-10 MED ORDER — WARFARIN SODIUM 2.5 MG PO TABS: ORAL_TABLET | ORAL | 1 refills | Status: DC

## 2019-12-10 NOTE — Telephone Encounter (Addendum)
Spoke with patient about BMS pt assistance. She does not think she will have spent enough out of pocket and is ok with going back to warfarin. She has 32 tablets of Eliquis left.  She will start warfarin on 7/20- overlap with Eliquis for 3 days  Will resume old dose of 1 tablet (2.5) every day except 1/2 (1.25mg ) on MWF INR check on 7/26 I will call patient again on 7/19 to review directions again per pt request.

## 2019-12-10 NOTE — Addendum Note (Signed)
Addended by: Marcelle Overlie D on: 12/10/2019 09:48 AM   Modules accepted: Orders

## 2019-12-16 ENCOUNTER — Telehealth: Payer: Self-pay | Admitting: Interventional Cardiology

## 2019-12-16 NOTE — Telephone Encounter (Signed)
   Pt c/o medication issue:  1. Name of Medication:   warfarin (COUMADIN) 2.5 MG tablet     2. How are you currently taking this medication (dosage and times per day)?   3. Are you having a reaction (difficulty breathing--STAT)?   4. What is your medication issue? Pt said per Regino Ramirez [place], [place] they need an approval to fill the prescription for warfarin. And if the nurse can call her once she get the warfarin to give her instructions on how to take it.

## 2019-12-16 NOTE — Telephone Encounter (Addendum)
[place] and the lady stated that she did not need authorization to fill the Warfarin but to see if the pt could get a 90 day supply. Advised that since the pt is re-establishing with [place] for warfarin/INR management that we will not give a 90 day supply at this time. Also, asked if she removed the Eliquis off the list and she stated she did.  Called the pt per message to update her and had to leave a message for her to call back for overlapping instructions.   Pt called back and I was able to update her that the pharmacy has the Warfarin refill and that she will get a 30 day supply until we figure out her maintenance dose since she is restarting warfarin and she verbalized understanding. Also, she mentioned she is due a call from the last person she spoke to regarding her overlapping eliquis and warfarin. Advised I do see that and she will overlap her last 3 days of eliquis and start the warfarin, then continue warfarin alone. She verbalized understanding and is aware that the note has the pharmacist has noted she will call her to remind her as well on 12/22/2019. Also, her appt to have INR checked was on 7/26 but she sees Dr. Irish Lack on 7/27 so move the appt over one day and put her in a new slot but she is aware once she is done with Dr. Irish Lack, they should call and we will prepare for her to be seen. She verbalized understanding.

## 2019-12-21 DIAGNOSIS — I10 Essential (primary) hypertension: Secondary | ICD-10-CM | POA: Diagnosis not present

## 2019-12-21 DIAGNOSIS — K529 Noninfective gastroenteritis and colitis, unspecified: Secondary | ICD-10-CM | POA: Diagnosis not present

## 2019-12-21 DIAGNOSIS — Z7901 Long term (current) use of anticoagulants: Secondary | ICD-10-CM | POA: Diagnosis not present

## 2019-12-21 DIAGNOSIS — I4891 Unspecified atrial fibrillation: Secondary | ICD-10-CM | POA: Diagnosis not present

## 2019-12-21 DIAGNOSIS — K5190 Ulcerative colitis, unspecified, without complications: Secondary | ICD-10-CM | POA: Diagnosis not present

## 2019-12-21 DIAGNOSIS — E119 Type 2 diabetes mellitus without complications: Secondary | ICD-10-CM | POA: Diagnosis not present

## 2019-12-22 NOTE — Telephone Encounter (Signed)
Called patient and reviewed instructions below with patient.

## 2019-12-22 NOTE — Telephone Encounter (Signed)
Called patient to review instructions for switching to warfarin. Patient was driving. Requested that I call her back at 3:30.

## 2019-12-26 DIAGNOSIS — I4891 Unspecified atrial fibrillation: Secondary | ICD-10-CM | POA: Diagnosis not present

## 2019-12-26 DIAGNOSIS — N183 Chronic kidney disease, stage 3 unspecified: Secondary | ICD-10-CM | POA: Diagnosis not present

## 2019-12-26 DIAGNOSIS — E1165 Type 2 diabetes mellitus with hyperglycemia: Secondary | ICD-10-CM | POA: Diagnosis not present

## 2019-12-26 DIAGNOSIS — N189 Chronic kidney disease, unspecified: Secondary | ICD-10-CM | POA: Diagnosis not present

## 2019-12-26 DIAGNOSIS — E119 Type 2 diabetes mellitus without complications: Secondary | ICD-10-CM | POA: Diagnosis not present

## 2019-12-26 DIAGNOSIS — I10 Essential (primary) hypertension: Secondary | ICD-10-CM | POA: Diagnosis not present

## 2019-12-26 DIAGNOSIS — E782 Mixed hyperlipidemia: Secondary | ICD-10-CM | POA: Diagnosis not present

## 2019-12-26 DIAGNOSIS — H35033 Hypertensive retinopathy, bilateral: Secondary | ICD-10-CM | POA: Diagnosis not present

## 2019-12-28 NOTE — Progress Notes (Signed)
Cardiology Office Note   Date:  12/30/2019   ID:  Lennyx, [place] 02-Aug-1940, MRN [id number]  PCP:  Leighton Ruff, MD    No chief complaint on file.  AFib  Wt Readings from Last 3 Encounters:  12/30/19 183 lb (83 kg)  09/04/19 186 lb (84.4 kg)  08/14/19 179 lb (81.2 kg)       History of Present Illness: LEEBA BARBE is a 79 y.o. female  who has a history of atrial fibrillation on warfarin. She underwent cardioversion back in 2005-09-22. This lasted for about a year but she reverted back to atrial fibrillation. She has been rate controlled since that time. She also has hypertension, hyperlipidemia, DM, smoker(1/2 ppd), and PVD with 50-69% RICA and patent Left carotid endarterectomy 10/22/14 followed by Dr. Oneida Alar.  She had lost 50 pounds over a 2 year period and maintained the wieght loss. She was exercising 3 days a week at [facility] cardiac rehabilitation and watching her diet. Dropped to 1-2 x/week.  She continued to smoke half a pack of cigarettes daily until 10/06/2015 when she quit. She restarted smoking with the stress of her husbands illness.   Shehad somechest discomfort in 3/18.She had anegative troponin andsx.resolved with belching.  She was diagnosed with ulcerative colitis. THis has improved with the medications started, mesalamine and colestipol.  Her husband hadlung cancer, but unfortunately passed away in 09/23/18.   Since the last visit, her back has been a problem.  SHe may need more shots in her back.  She has ha a shot that helped for a month in the past.   No bleeding problems.   Denies : Chest pain. Dizziness. Leg edema. Nitroglycerin use. Orthopnea. Palpitations. Paroxysmal nocturnal dyspnea. Shortness of breath. Syncope.   Interested in [place] [facility].  Not taking her Vit D regularly.    Feels she has muscle weakness.     Past Medical History:  Diagnosis Date  . A-fib ([place])   . Carotid artery occlusion   . Diabetes mellitus     . Hyperlipidemia   . Irregular heart beat   . Vitamin D deficiency     Past Surgical History:  Procedure Laterality Date  . ABDOMINAL HERNIA REPAIR    . CAROTID ENDARTERECTOMY Left Dec. 3, 2008    cea  . CATARACT EXTRACTION Left   . HYSTEROSCOPY WITH D & C N/A 07/30/2014   Procedure: DILATATION AND CURETTAGE /HYSTEROSCOPY;  Surgeon: Osborne Oman, MD;  Location: [facility];  Service: Gynecology;  Laterality: N/A;  . IUD REMOVAL N/A 07/30/2014   Procedure: INTRAUTERINE DEVICE (IUD) REMOVAL;  Surgeon: Osborne Oman, MD;  Location: [facility];  Service: Gynecology;  Laterality: N/A;     Current Outpatient Medications  Medication Sig Dispense Refill  . acetaminophen (TYLENOL) 500 MG tablet Take 1,000 mg by mouth every 6 (six) hours as needed for mild pain (For back pain.).    Marland Kitchen atorvastatin (LIPITOR) 20 MG tablet Take 1 tablet (20 mg total) by mouth daily. 90 tablet 2  . colestipol (COLESTID) 1 g tablet Take 1 g by mouth 2 (two) times daily.    Marland Kitchen diltiazem (CARDIZEM CD) 180 MG 24 hr capsule TAKE 1 CAPSULE BY MOUTH TWICE DAILY BEFORE MEALS 180 capsule 3  . diphenoxylate-atropine (LOMOTIL) 2.5-0.025 MG tablet Take 1 tablet by mouth 2 (two) times daily as needed.    . fexofenadine (ALLEGRA) 180 MG tablet Take 180 mg by mouth daily.    . furosemide (LASIX)  40 MG tablet Take 40 mg by mouth daily.      Marland Kitchen glimepiride (AMARYL) 2 MG tablet TK 1 T PO BID  1  . HYDROcodone-acetaminophen (NORCO/VICODIN) 5-325 MG tablet Take 1 tablet by mouth every 6 (six) hours as needed for moderate pain or severe pain. Reported on 11/18/2015    . lisinopril (PRINIVIL,ZESTRIL) 5 MG tablet Take 2.5 mg by mouth daily.    . mesalamine (LIALDA) 1.2 g EC tablet Take 4.8 g by mouth daily with breakfast.    . ONE TOUCH ULTRA TEST test strip 1 each by Other route as needed (for BG testing).   5  . potassium chloride SA (K-DUR) 20 MEQ tablet TAKE 1 TABLET BY MOUTH 3 DAYS A WEEK 36 tablet 3  . pseudoephedrine-guaifenesin  (MUCINEX D) 60-600 MG 12 hr tablet Take 1 tablet by mouth every 12 (twelve) hours.    . Vitamin D, Ergocalciferol, (DRISDOL) 1.25 MG (50000 UNIT) CAPS capsule Take 50,000 Units by mouth once a week.    . warfarin (COUMADIN) 2.5 MG tablet Take 1 tablet daily except 1/2 tablet on Mon, Wed, Fri or as directed by coumadin clinic 30 tablet 1   No current facility-administered medications for this visit.    Allergies:   Avandia [rosiglitazone] and Sulfa antibiotics    Social History:  The patient  reports that she quit smoking about 3 years ago. Her smoking use included cigarettes. She quit after 50.00 years of use. She has never used smokeless tobacco. She reports current alcohol use. She reports that she does not use drugs.   Family History:  The patient's family history includes Cancer in her mother; Heart attack in her father; Hypertension in her sister.    ROS:  Please see the history of present illness.   Otherwise, review of systems are positive for leg numbness.   All other systems are reviewed and negative.    PHYSICAL EXAM: VS:  BP (!) 118/52   Pulse 60   Ht 5\' 9"  (1.753 m)   Wt 183 lb (83 kg)   SpO2 98%   BMI 27.02 kg/m  , BMI Body mass index is 27.02 kg/m. GEN: Well nourished, well developed, in no acute distress  HEENT: normal  Neck: no JVD, carotid bruits, or masses Cardiac: irregularly irregular; no murmurs, rubs, or gallops,no edema  Respiratory:  clear to auscultation bilaterally, normal work of breathing GI: soft, nontender, nondistended, + BS MS: no deformity or atrophy  Skin: mild leg discoloration- venous changes Neuro:  Strength and sensation are intact Psych: euthymic mood, full affect   EKG:   The ekg ordered today demonstrates AFib, rate controlled.   Recent Labs: 08/19/2019: Creatinine, Ser 1.80   Lipid Panel    Component Value Date/Time   CHOL 132 12/25/2017 0737   TRIG 141 12/25/2017 0737   HDL 38 (L) 12/25/2017 0737   CHOLHDL 3.5 12/25/2017  0737   CHOLHDL 2.7 02/23/2016 0749   VLDL 24 02/23/2016 0749   LDLCALC 66 12/25/2017 0737     Other studies Reviewed: Additional studies/ records that were reviewed today with results demonstrating: labs reviewed.   ASSESSMENT AND PLAN:  1. AFib: Coumadin for stroke prevention. Rate controlled. Uses smart watch to monitor HR. 2. Hypertensive heart disease: The current medical regimen is effective;  continue present plan and medications. 3. Carotid disease: Followed by [facility].  4. DM: A1C 8.2.  Whole food, plant based diet recommended. 5. Hyperlipidemia: Whole food, plant based diet recommended.  LDL  at target. 6. Tobacco abuse:  Stopped again. She needs to avoid tobacco given her PAD.   Current medicines are reviewed at length with the patient today.  The patient concerns regarding her medicines were addressed.  The following changes have been made:  OK to start CoQ 10; take Vit D as prescribed, once a week.   Labs/ tests ordered today include:  No orders of the defined types were placed in this encounter.   Recommend 150 minutes/week of aerobic exercise Low fat, low carb, high fiber diet recommended  Disposition:   FU in 1 year   Signed, Larae Grooms, MD  12/30/2019 9:31 AM    [facility] [address], [place], [place]  [address] Phone: [phone]; Fax: [phone]

## 2019-12-29 DIAGNOSIS — M6281 Muscle weakness (generalized): Secondary | ICD-10-CM | POA: Diagnosis not present

## 2019-12-29 DIAGNOSIS — M5416 Radiculopathy, lumbar region: Secondary | ICD-10-CM | POA: Diagnosis not present

## 2019-12-30 ENCOUNTER — Encounter: Payer: Self-pay | Admitting: Interventional Cardiology

## 2019-12-30 ENCOUNTER — Ambulatory Visit: Payer: PPO | Admitting: Interventional Cardiology

## 2019-12-30 ENCOUNTER — Other Ambulatory Visit: Payer: Self-pay

## 2019-12-30 ENCOUNTER — Ambulatory Visit (INDEPENDENT_AMBULATORY_CARE_PROVIDER_SITE_OTHER): Payer: PPO | Admitting: *Deleted

## 2019-12-30 VITALS — BP 118/52 | HR 60 | Ht 69.0 in | Wt 183.0 lb

## 2019-12-30 DIAGNOSIS — I119 Hypertensive heart disease without heart failure: Secondary | ICD-10-CM

## 2019-12-30 DIAGNOSIS — E782 Mixed hyperlipidemia: Secondary | ICD-10-CM

## 2019-12-30 DIAGNOSIS — I4821 Permanent atrial fibrillation: Secondary | ICD-10-CM

## 2019-12-30 DIAGNOSIS — Z720 Tobacco use: Secondary | ICD-10-CM | POA: Diagnosis not present

## 2019-12-30 DIAGNOSIS — I4891 Unspecified atrial fibrillation: Secondary | ICD-10-CM

## 2019-12-30 DIAGNOSIS — Z5181 Encounter for therapeutic drug level monitoring: Secondary | ICD-10-CM

## 2019-12-30 DIAGNOSIS — I6523 Occlusion and stenosis of bilateral carotid arteries: Secondary | ICD-10-CM | POA: Diagnosis not present

## 2019-12-30 LAB — POCT INR: INR: 2.6 (ref 2.0–3.0)

## 2019-12-30 NOTE — Patient Instructions (Signed)
Medication Instructions:  Your physician recommends that you continue on your current medications as directed. Please refer to the Current Medication list given to you today. Dr Irish Lack says that it is okay for you to take CoQ10.  *If you need a refill on your cardiac medications before your next appointment, please call your pharmacy*   Lab Work: None  If you have labs (blood work) drawn today and your tests are completely normal, you will receive your results only by: Marland Kitchen MyChart Message (if you have MyChart) OR . A paper copy in the mail If you have any lab test that is abnormal or we need to change your treatment, we will call you to review the results.   Testing/Procedures: None    Follow-Up: At [facility], you and your health needs are our priority.  As part of our continuing mission to provide you with exceptional heart care, we have created designated Provider Care Teams.  These Care Teams include your primary Cardiologist (physician) and Advanced Practice Providers (APPs -  Physician Assistants and Nurse Practitioners) who all work together to provide you with the care you need, when you need it.  We recommend signing up for the patient portal called "MyChart".  Sign up information is provided on this After Visit Summary.  MyChart is used to connect with patients for Virtual Visits (Telemedicine).  Patients are able to view lab/test results, encounter notes, upcoming appointments, etc.  Non-urgent messages can be sent to your provider as well.   To learn more about what you can do with MyChart, go to [email or web address].    Your next appointment:   12 month(s)  The format for your next appointment:   In Person  Provider:   You may see Larae Grooms, MD or one of the following Advanced Practice Providers on your designated Care Team:    Melina Copa, PA-C  Ermalinda Barrios, PA-C

## 2019-12-30 NOTE — Patient Instructions (Addendum)
A full discussion of the nature of anticoagulants has been carried out.  A benefit risk analysis has been presented to the patient, so that they understand the justification for choosing anticoagulation at this time. The need for frequent and regular monitoring, precise dosage adjustment and compliance is stressed.  Side effects of potential bleeding are discussed.  The patient should avoid any OTC items containing aspirin or ibuprofen, and should avoid great swings in general diet.  Avoid alcohol consumption.  Call if any signs of abnormal bleeding.    Description   Continue taking 1 tablet daily except 1/2 tablet on Mondays, Wednesdays, and Fridays. Recheck INR in 1 week. Coumadin Clinic [phone] Main [phone] Fax [phone]     Always let your physicians you are on Warfarin and if you have any procedures ask if you need to hold your Warfarin and if you do need to hold the med have them fax over a form to [phone] or [phone].

## 2020-01-02 DIAGNOSIS — M5416 Radiculopathy, lumbar region: Secondary | ICD-10-CM | POA: Diagnosis not present

## 2020-01-12 ENCOUNTER — Other Ambulatory Visit: Payer: Self-pay

## 2020-01-12 ENCOUNTER — Ambulatory Visit (INDEPENDENT_AMBULATORY_CARE_PROVIDER_SITE_OTHER): Payer: PPO | Admitting: *Deleted

## 2020-01-12 DIAGNOSIS — I4891 Unspecified atrial fibrillation: Secondary | ICD-10-CM | POA: Diagnosis not present

## 2020-01-12 DIAGNOSIS — Z5181 Encounter for therapeutic drug level monitoring: Secondary | ICD-10-CM | POA: Diagnosis not present

## 2020-01-12 LAB — POCT INR: INR: 5.4 — AB (ref 2.0–3.0)

## 2020-01-12 NOTE — Patient Instructions (Signed)
Description   Do not take any Warfarin today and do not take any Warfarin tomorrow then start taking 1/2 tablet daily except 1 tablet on Tuesday, Thursday, and Saturday. Recheck INR in 1 week. Coumadin Clinic [phone] Main [phone] Fax [phone]

## 2020-01-13 DIAGNOSIS — M6281 Muscle weakness (generalized): Secondary | ICD-10-CM | POA: Diagnosis not present

## 2020-01-13 DIAGNOSIS — M545 Low back pain: Secondary | ICD-10-CM | POA: Diagnosis not present

## 2020-01-15 DIAGNOSIS — I4891 Unspecified atrial fibrillation: Secondary | ICD-10-CM | POA: Diagnosis not present

## 2020-01-15 DIAGNOSIS — E782 Mixed hyperlipidemia: Secondary | ICD-10-CM | POA: Diagnosis not present

## 2020-01-15 DIAGNOSIS — H35033 Hypertensive retinopathy, bilateral: Secondary | ICD-10-CM | POA: Diagnosis not present

## 2020-01-15 DIAGNOSIS — E119 Type 2 diabetes mellitus without complications: Secondary | ICD-10-CM | POA: Diagnosis not present

## 2020-01-15 DIAGNOSIS — N189 Chronic kidney disease, unspecified: Secondary | ICD-10-CM | POA: Diagnosis not present

## 2020-01-15 DIAGNOSIS — E1165 Type 2 diabetes mellitus with hyperglycemia: Secondary | ICD-10-CM | POA: Diagnosis not present

## 2020-01-15 DIAGNOSIS — N183 Chronic kidney disease, stage 3 unspecified: Secondary | ICD-10-CM | POA: Diagnosis not present

## 2020-01-15 DIAGNOSIS — I10 Essential (primary) hypertension: Secondary | ICD-10-CM | POA: Diagnosis not present

## 2020-01-20 DIAGNOSIS — M6281 Muscle weakness (generalized): Secondary | ICD-10-CM | POA: Diagnosis not present

## 2020-01-20 DIAGNOSIS — M545 Low back pain: Secondary | ICD-10-CM | POA: Diagnosis not present

## 2020-01-21 ENCOUNTER — Ambulatory Visit (INDEPENDENT_AMBULATORY_CARE_PROVIDER_SITE_OTHER): Payer: PPO | Admitting: *Deleted

## 2020-01-21 ENCOUNTER — Other Ambulatory Visit: Payer: Self-pay

## 2020-01-21 DIAGNOSIS — I4891 Unspecified atrial fibrillation: Secondary | ICD-10-CM | POA: Diagnosis not present

## 2020-01-21 DIAGNOSIS — Z5181 Encounter for therapeutic drug level monitoring: Secondary | ICD-10-CM

## 2020-01-21 LAB — POCT INR: INR: 3.8 — AB (ref 2.0–3.0)

## 2020-01-21 NOTE — Patient Instructions (Signed)
Description   Do not take any Warfarin today, then start taking 1/2 tablet daily except 1 tablet on Tuesday and Saturday. Recheck INR in 1 week. Coumadin Clinic [phone] Main [phone] Fax [phone]

## 2020-01-23 DIAGNOSIS — M6281 Muscle weakness (generalized): Secondary | ICD-10-CM | POA: Diagnosis not present

## 2020-01-23 DIAGNOSIS — M545 Low back pain: Secondary | ICD-10-CM | POA: Diagnosis not present

## 2020-01-25 ENCOUNTER — Other Ambulatory Visit: Payer: Self-pay | Admitting: Interventional Cardiology

## 2020-01-26 DIAGNOSIS — L570 Actinic keratosis: Secondary | ICD-10-CM | POA: Diagnosis not present

## 2020-01-26 DIAGNOSIS — C44629 Squamous cell carcinoma of skin of left upper limb, including shoulder: Secondary | ICD-10-CM | POA: Diagnosis not present

## 2020-01-26 DIAGNOSIS — X32XXXA Exposure to sunlight, initial encounter: Secondary | ICD-10-CM | POA: Diagnosis not present

## 2020-01-27 DIAGNOSIS — M6281 Muscle weakness (generalized): Secondary | ICD-10-CM | POA: Diagnosis not present

## 2020-01-27 DIAGNOSIS — M545 Low back pain: Secondary | ICD-10-CM | POA: Diagnosis not present

## 2020-01-28 ENCOUNTER — Other Ambulatory Visit: Payer: Self-pay

## 2020-01-28 ENCOUNTER — Ambulatory Visit (INDEPENDENT_AMBULATORY_CARE_PROVIDER_SITE_OTHER): Payer: PPO | Admitting: *Deleted

## 2020-01-28 DIAGNOSIS — Z5181 Encounter for therapeutic drug level monitoring: Secondary | ICD-10-CM | POA: Diagnosis not present

## 2020-01-28 DIAGNOSIS — I4891 Unspecified atrial fibrillation: Secondary | ICD-10-CM

## 2020-01-28 LAB — POCT INR: INR: 3 (ref 2.0–3.0)

## 2020-01-28 NOTE — Patient Instructions (Signed)
Description   Continue taking 1/2 tablet daily except 1 tablet on Tuesday and Saturday. Recheck INR in 1 week. Coumadin Clinic [phone] Main [phone] Fax [phone]

## 2020-01-30 DIAGNOSIS — M6281 Muscle weakness (generalized): Secondary | ICD-10-CM | POA: Diagnosis not present

## 2020-01-30 DIAGNOSIS — M545 Low back pain: Secondary | ICD-10-CM | POA: Diagnosis not present

## 2020-02-03 DIAGNOSIS — M545 Low back pain: Secondary | ICD-10-CM | POA: Diagnosis not present

## 2020-02-03 DIAGNOSIS — M6281 Muscle weakness (generalized): Secondary | ICD-10-CM | POA: Diagnosis not present

## 2020-02-04 ENCOUNTER — Other Ambulatory Visit: Payer: Self-pay

## 2020-02-04 ENCOUNTER — Ambulatory Visit (INDEPENDENT_AMBULATORY_CARE_PROVIDER_SITE_OTHER): Payer: PPO | Admitting: *Deleted

## 2020-02-04 DIAGNOSIS — I4891 Unspecified atrial fibrillation: Secondary | ICD-10-CM | POA: Diagnosis not present

## 2020-02-04 DIAGNOSIS — Z5181 Encounter for therapeutic drug level monitoring: Secondary | ICD-10-CM

## 2020-02-04 LAB — POCT INR: INR: 3 (ref 2.0–3.0)

## 2020-02-04 NOTE — Patient Instructions (Signed)
Description   Continue taking 1/2 tablet daily except 1 tablet on Tuesday and Saturday. Add another serving of leafy veggies to your diet weekly. Recheck INR in 1 week. Coumadin Clinic [phone] Main [phone] Fax [phone]

## 2020-02-05 DIAGNOSIS — M6281 Muscle weakness (generalized): Secondary | ICD-10-CM | POA: Diagnosis not present

## 2020-02-05 DIAGNOSIS — M545 Low back pain: Secondary | ICD-10-CM | POA: Diagnosis not present

## 2020-02-09 ENCOUNTER — Other Ambulatory Visit: Payer: Self-pay | Admitting: Interventional Cardiology

## 2020-02-10 DIAGNOSIS — M6281 Muscle weakness (generalized): Secondary | ICD-10-CM | POA: Diagnosis not present

## 2020-02-10 DIAGNOSIS — M545 Low back pain: Secondary | ICD-10-CM | POA: Diagnosis not present

## 2020-02-12 ENCOUNTER — Other Ambulatory Visit: Payer: Self-pay

## 2020-02-12 ENCOUNTER — Ambulatory Visit (INDEPENDENT_AMBULATORY_CARE_PROVIDER_SITE_OTHER): Payer: PPO | Admitting: *Deleted

## 2020-02-12 DIAGNOSIS — M6281 Muscle weakness (generalized): Secondary | ICD-10-CM | POA: Diagnosis not present

## 2020-02-12 DIAGNOSIS — I4891 Unspecified atrial fibrillation: Secondary | ICD-10-CM

## 2020-02-12 DIAGNOSIS — Z5181 Encounter for therapeutic drug level monitoring: Secondary | ICD-10-CM | POA: Diagnosis not present

## 2020-02-12 DIAGNOSIS — M545 Low back pain: Secondary | ICD-10-CM | POA: Diagnosis not present

## 2020-02-12 LAB — POCT INR: INR: 2.9 (ref 2.0–3.0)

## 2020-02-12 NOTE — Patient Instructions (Signed)
Description   Continue taking Warfarin 1/2 tablet daily except 1 tablet on Tuesday and Saturday. Continue eating serving of leafy veggies to your diet weekly. Recheck INR in 2 weeks. Coumadin Clinic [phone] Main [phone] Fax [phone]

## 2020-02-17 ENCOUNTER — Other Ambulatory Visit: Payer: Self-pay | Admitting: Interventional Cardiology

## 2020-02-17 DIAGNOSIS — M6281 Muscle weakness (generalized): Secondary | ICD-10-CM | POA: Diagnosis not present

## 2020-02-17 DIAGNOSIS — M545 Low back pain: Secondary | ICD-10-CM | POA: Diagnosis not present

## 2020-02-19 DIAGNOSIS — M6281 Muscle weakness (generalized): Secondary | ICD-10-CM | POA: Diagnosis not present

## 2020-02-19 DIAGNOSIS — M545 Low back pain: Secondary | ICD-10-CM | POA: Diagnosis not present

## 2020-02-24 ENCOUNTER — Telehealth: Payer: Self-pay | Admitting: *Deleted

## 2020-02-24 DIAGNOSIS — M545 Low back pain: Secondary | ICD-10-CM | POA: Diagnosis not present

## 2020-02-24 DIAGNOSIS — M6281 Muscle weakness (generalized): Secondary | ICD-10-CM | POA: Diagnosis not present

## 2020-02-24 NOTE — Telephone Encounter (Signed)
Called pt as she missed her appt with Anticoagulation Clinic and got her rescheduled for tomorrow.

## 2020-02-25 ENCOUNTER — Ambulatory Visit (INDEPENDENT_AMBULATORY_CARE_PROVIDER_SITE_OTHER): Payer: PPO | Admitting: Pharmacist

## 2020-02-25 ENCOUNTER — Other Ambulatory Visit: Payer: Self-pay

## 2020-02-25 DIAGNOSIS — Z5181 Encounter for therapeutic drug level monitoring: Secondary | ICD-10-CM

## 2020-02-25 DIAGNOSIS — I4891 Unspecified atrial fibrillation: Secondary | ICD-10-CM

## 2020-02-25 LAB — POCT INR: INR: 1.5 — AB (ref 2.0–3.0)

## 2020-02-25 NOTE — Patient Instructions (Signed)
Description   Take 1 full tablet today and tomorrow, then continue taking Warfarin 1/2 tablet daily except 1 tablet on Tuesday and Saturday. Continue eating serving of leafy veggies to your diet weekly. Recheck INR in 2 weeks. Coumadin Clinic [phone] Main [phone] Fax [phone]

## 2020-02-26 DIAGNOSIS — Z08 Encounter for follow-up examination after completed treatment for malignant neoplasm: Secondary | ICD-10-CM | POA: Diagnosis not present

## 2020-02-26 DIAGNOSIS — Z85828 Personal history of other malignant neoplasm of skin: Secondary | ICD-10-CM | POA: Diagnosis not present

## 2020-02-26 DIAGNOSIS — L308 Other specified dermatitis: Secondary | ICD-10-CM | POA: Diagnosis not present

## 2020-02-29 ENCOUNTER — Other Ambulatory Visit: Payer: Self-pay | Admitting: Interventional Cardiology

## 2020-03-06 DIAGNOSIS — Z20828 Contact with and (suspected) exposure to other viral communicable diseases: Secondary | ICD-10-CM | POA: Diagnosis not present

## 2020-03-09 ENCOUNTER — Other Ambulatory Visit: Payer: Self-pay

## 2020-03-09 ENCOUNTER — Ambulatory Visit (INDEPENDENT_AMBULATORY_CARE_PROVIDER_SITE_OTHER): Payer: PPO | Admitting: *Deleted

## 2020-03-09 DIAGNOSIS — Z5181 Encounter for therapeutic drug level monitoring: Secondary | ICD-10-CM | POA: Diagnosis not present

## 2020-03-09 DIAGNOSIS — I4891 Unspecified atrial fibrillation: Secondary | ICD-10-CM | POA: Diagnosis not present

## 2020-03-09 LAB — POCT INR: INR: 2.5 (ref 2.0–3.0)

## 2020-03-09 NOTE — Patient Instructions (Signed)
Description   Continue taking Warfarin 1/2 tablet daily except 1 tablet on Tuesday and Saturday. Continue eating serving of leafy veggies to your diet weekly. Recheck INR in 3 weeks. Coumadin Clinic [phone] Main [phone] Fax [phone]

## 2020-03-15 DIAGNOSIS — M545 Low back pain, unspecified: Secondary | ICD-10-CM | POA: Diagnosis not present

## 2020-03-15 DIAGNOSIS — M6281 Muscle weakness (generalized): Secondary | ICD-10-CM | POA: Diagnosis not present

## 2020-03-18 DIAGNOSIS — M545 Low back pain, unspecified: Secondary | ICD-10-CM | POA: Diagnosis not present

## 2020-03-18 DIAGNOSIS — M6281 Muscle weakness (generalized): Secondary | ICD-10-CM | POA: Diagnosis not present

## 2020-03-23 DIAGNOSIS — M545 Low back pain, unspecified: Secondary | ICD-10-CM | POA: Diagnosis not present

## 2020-03-23 DIAGNOSIS — M6281 Muscle weakness (generalized): Secondary | ICD-10-CM | POA: Diagnosis not present

## 2020-03-25 DIAGNOSIS — M545 Low back pain, unspecified: Secondary | ICD-10-CM | POA: Diagnosis not present

## 2020-03-25 DIAGNOSIS — M6281 Muscle weakness (generalized): Secondary | ICD-10-CM | POA: Diagnosis not present

## 2020-03-30 ENCOUNTER — Ambulatory Visit (INDEPENDENT_AMBULATORY_CARE_PROVIDER_SITE_OTHER): Payer: PPO | Admitting: Pharmacist

## 2020-03-30 ENCOUNTER — Other Ambulatory Visit: Payer: Self-pay

## 2020-03-30 DIAGNOSIS — M6281 Muscle weakness (generalized): Secondary | ICD-10-CM | POA: Diagnosis not present

## 2020-03-30 DIAGNOSIS — I4891 Unspecified atrial fibrillation: Secondary | ICD-10-CM | POA: Diagnosis not present

## 2020-03-30 DIAGNOSIS — M545 Low back pain, unspecified: Secondary | ICD-10-CM | POA: Diagnosis not present

## 2020-03-30 DIAGNOSIS — Z5181 Encounter for therapeutic drug level monitoring: Secondary | ICD-10-CM | POA: Diagnosis not present

## 2020-03-30 LAB — POCT INR: INR: 3.4 — AB (ref 2.0–3.0)

## 2020-03-30 NOTE — Patient Instructions (Signed)
Hold warfarin today then continue taking Warfarin 1/2 tablet daily except 1 tablet on Tuesday and Saturday. Continue eating serving of leafy veggies to your diet weekly. Recheck INR in 2 weeks. Coumadin Clinic [phone] Main [phone] Fax [phone]

## 2020-04-01 DIAGNOSIS — M545 Low back pain, unspecified: Secondary | ICD-10-CM | POA: Diagnosis not present

## 2020-04-01 DIAGNOSIS — M6281 Muscle weakness (generalized): Secondary | ICD-10-CM | POA: Diagnosis not present

## 2020-04-06 DIAGNOSIS — M6281 Muscle weakness (generalized): Secondary | ICD-10-CM | POA: Diagnosis not present

## 2020-04-06 DIAGNOSIS — M545 Low back pain, unspecified: Secondary | ICD-10-CM | POA: Diagnosis not present

## 2020-04-09 DIAGNOSIS — M5117 Intervertebral disc disorders with radiculopathy, lumbosacral region: Secondary | ICD-10-CM | POA: Diagnosis not present

## 2020-04-09 DIAGNOSIS — M5416 Radiculopathy, lumbar region: Secondary | ICD-10-CM | POA: Diagnosis not present

## 2020-04-09 DIAGNOSIS — M5137 Other intervertebral disc degeneration, lumbosacral region: Secondary | ICD-10-CM | POA: Diagnosis not present

## 2020-04-14 ENCOUNTER — Other Ambulatory Visit: Payer: Self-pay

## 2020-04-14 ENCOUNTER — Ambulatory Visit (INDEPENDENT_AMBULATORY_CARE_PROVIDER_SITE_OTHER): Payer: PPO | Admitting: *Deleted

## 2020-04-14 ENCOUNTER — Telehealth: Payer: Self-pay | Admitting: *Deleted

## 2020-04-14 DIAGNOSIS — I4891 Unspecified atrial fibrillation: Secondary | ICD-10-CM | POA: Diagnosis not present

## 2020-04-14 DIAGNOSIS — Z5181 Encounter for therapeutic drug level monitoring: Secondary | ICD-10-CM | POA: Diagnosis not present

## 2020-04-14 LAB — POCT INR: INR: 2.8 (ref 2.0–3.0)

## 2020-04-14 NOTE — Telephone Encounter (Signed)
Patient here for coumadin clinic visit today and mentioned she has been having ache in left arm.  BP in coumadin clinic is 132/79 and heart rate 85.  Oxygen sat 98%.  I spoke with patient who reports ache has been going on for several months.  Improves some with tylenol and hemp cream.  Has not worsened recently.  Thinks it may be neuropathy. No chest pain.  Has been having back issues and receiving injections.  Reviewed with Dr Angelena Form and no EKG or other changes needed at this time.  Make Dr Irish Lack aware.  Dr Angelena Form asked if patient had Covid vaccine or booster shot recently as some people have been complaining of ongoing arm pain after receiving.  Patient reports she did receive additional vaccine in August in left arm and thinks pain may have started shortly after that time.   Patient read PAD sign on wall in exam room and reports she has discoloring in lower legs.  She does not have any pain when walking. Will forward to Dr Irish Lack.

## 2020-04-14 NOTE — Patient Instructions (Signed)
Description   Continue taking Warfarin 1/2 tablet daily except 1 tablet on Tuesday and Saturday. Continue eating serving of leafy veggies to your diet weekly. Recheck INR in 3 weeks. [facility] Clinic [phone] Main [phone] Fax [phone]

## 2020-04-14 NOTE — Telephone Encounter (Signed)
I agree with continuing to monitor.  THis does not sound like cardiac chest pain.

## 2020-04-14 NOTE — Telephone Encounter (Signed)
Called patient back to let her know Dr. Irish Lack advisement. Patient verbalized understanding.

## 2020-04-16 DIAGNOSIS — M545 Low back pain, unspecified: Secondary | ICD-10-CM | POA: Diagnosis not present

## 2020-04-16 DIAGNOSIS — M6281 Muscle weakness (generalized): Secondary | ICD-10-CM | POA: Diagnosis not present

## 2020-04-20 NOTE — Progress Notes (Signed)
Cardiology Office Note   Date:  04/22/2020   ID:  Cynthia, Hansen April 13, 1941, MRN [id number]  PCP:  Cynthia Nip, MD    No chief complaint on file.  AFib  Wt Readings from Last 3 Encounters:  04/22/20 188 lb (85.3 kg)  12/30/19 183 lb (83 kg)  09/04/19 186 lb (84.4 kg)       History of Present Illness: Cynthia Hansen is a 79 y.o. female  who has a history of atrial fibrillation on warfarin. She underwent cardioversion back in 2007. This lasted for about a year but she reverted back to atrial fibrillation. She has been rate controlled since that time. She also has hypertension, hyperlipidemia, DM, smoker(1/2 ppd), and PVD with 50-69% RICA and patent Left carotid endarterectomy 10/22/14 followed by Dr. Oneida Alar.  She had lost 50 pounds over a 2 year period and maintained the wieght loss. She was exercising 3 days a week at [facility] and watching her diet. Dropped to 1-2 x/week.  She continued to smoke half a pack of cigarettes daily until 10/06/2015 when she quit. She restarted smoking with the stress of her husbands illness.   Shehad somechest discomfort in 3/18.She had anegative troponin andsx.resolved with belching.  She was diagnosed with ulcerative colitis. THis has improved with the medications started, mesalamine and colestipol.  Her husband passed away fromlung cancer.  In 11/21, she report: "she has been having ache in left arm.  BP in coumadin clinic is 132/79 and heart rate 85.  Oxygen sat 98%.  I spoke with patient who reports ache has been going on for several months.  Improves some with tylenol and hemp cream.  Has not worsened recently.  Thinks it may be neuropathy. No chest pain.  Has been having back issues and receiving injections.  Reviewed with Dr Angelena Form and no EKG or other changes needed at this time.  Make Dr Irish Lack aware.  Dr Angelena Form asked if patient had Covid vaccine or booster shot recently as  some people have been complaining of ongoing arm pain after receiving.  Patient reports she did receive additional vaccine in August in left arm and thinks pain may have started shortly after that time.   Patient read PAD sign on wall in exam room and reports she has discoloring in lower legs.  She does not have any pain when walking."  She had been doing well today, until her back pain started this AM.   Denies : exertional Chest pain. Dizziness. Leg edema. Nitroglycerin use. Orthopnea. Palpitations. Paroxysmal nocturnal dyspnea. Shortness of breath. Syncope.   Still has some shoulder pain radiating to chest.     Past Medical History:  Diagnosis Date  . A-fib ([place])   . Carotid artery occlusion   . Diabetes mellitus   . Hyperlipidemia   . Irregular heart beat   . Vitamin D deficiency     Past Surgical History:  Procedure Laterality Date  . ABDOMINAL HERNIA REPAIR    . CAROTID ENDARTERECTOMY Left Dec. 3, 2008    cea  . CATARACT EXTRACTION Left   . HYSTEROSCOPY WITH D & C N/A 07/30/2014   Procedure: DILATATION AND CURETTAGE /HYSTEROSCOPY;  Surgeon: Cynthia Hansen Oman, MD;  Location: [facility];  Service: Gynecology;  Laterality: N/A;  . IUD REMOVAL N/A 07/30/2014   Procedure: INTRAUTERINE DEVICE (IUD) REMOVAL;  Surgeon: Cynthia Hansen Oman, MD;  Location: [facility];  Service: Gynecology;  Laterality: N/A;     Current Outpatient  Medications  Medication Sig Dispense Refill  . acetaminophen (TYLENOL) 500 MG tablet Take 1,000 mg by mouth every 6 (six) hours as needed for mild pain (For back pain.).    Marland Kitchen atorvastatin (LIPITOR) 20 MG tablet TAKE 1 TABLET(20 MG) BY MOUTH DAILY 90 tablet 2  . colestipol (COLESTID) 1 g tablet Take 1 g by mouth 2 (two) times daily.    Marland Kitchen diltiazem (CARDIZEM CD) 180 MG 24 hr capsule TAKE 1 CAPSULE BY MOUTH TWICE DAILY BEFORE MEALS 180 capsule 3  . diphenoxylate-atropine (LOMOTIL) 2.5-0.025 MG tablet Take 1 tablet by mouth 2 (two) times daily as needed.    .  fexofenadine (ALLEGRA) 180 MG tablet Take 180 mg by mouth daily.    Marland Kitchen FLUAD QUADRIVALENT 0.5 ML injection     . furosemide (LASIX) 40 MG tablet Take 40 mg by mouth daily.      Marland Kitchen glimepiride (AMARYL) 2 MG tablet TK 1 T PO BID  1  . HYDROcodone-acetaminophen (NORCO/VICODIN) 5-325 MG tablet Take 1 tablet by mouth every 6 (six) hours as needed for moderate pain or severe pain. Reported on 11/18/2015    . lisinopril (PRINIVIL,ZESTRIL) 5 MG tablet Take 2.5 mg by mouth daily.    . mesalamine (LIALDA) 1.2 g EC tablet Take 4.8 g by mouth daily with breakfast.    . ONE TOUCH ULTRA TEST test strip 1 each by Other route as needed (for BG testing).   5  . potassium chloride SA (KLOR-CON) 20 MEQ tablet TAKE 1 TABLET BY MOUTH 3 DAYS A WEEK 36 tablet 3  . pseudoephedrine-guaifenesin (MUCINEX D) 60-600 MG 12 hr tablet Take 1 tablet by mouth every 12 (twelve) hours.    . Vitamin D, Ergocalciferol, (DRISDOL) 1.25 MG (50000 UNIT) CAPS capsule Take 50,000 Units by mouth once a week.    . warfarin (COUMADIN) 2.5 MG tablet Take 1/2 tablet daily except 1 tablet on Tuesdays and Saturdays or as directed 30 tablet 4   No current facility-administered medications for this visit.    Allergies:   Avandia [rosiglitazone] and Sulfa antibiotics    Social History:  The patient  reports that she quit smoking about 3 years ago. Her smoking use included cigarettes. She quit after 50.00 years of use. She has never used smokeless tobacco. She reports current alcohol use. She reports that she does not use drugs.   Family History:  The patient's family history includes Cancer in her mother; Heart attack in her father; Hypertension in her sister.    ROS:  Please see the history of present illness.   Otherwise, review of systems are positive for back pain.   All other systems are reviewed and negative.    PHYSICAL EXAM: VS:  BP 118/60   Pulse 77   Ht 5\' 9"  (1.753 m)   Wt 188 lb (85.3 kg)   SpO2 98%   BMI 27.76 kg/m  , BMI  Body mass index is 27.76 kg/m. GEN: Well nourished, well developed, in no acute distress  HEENT: normal  Neck: no JVD, carotid bruits, or masses Cardiac: irregularly irregular; no murmurs, rubs, or gallops,no edema  Respiratory:  clear to auscultation bilaterally, normal work of breathing GI: soft, nontender, nondistended, + BS MS: no deformity or atrophy  Skin: warm and dry, no rash Neuro:  Strength and sensation are intact Psych: euthymic mood, full affect   EKG:   The ekg ordered today demonstrates    Recent Labs: 08/19/2019: Creatinine, Ser 1.80   Lipid Panel  Component Value Date/Time   CHOL 132 12/25/2017 0737   TRIG 141 12/25/2017 0737   HDL 38 (L) 12/25/2017 0737   CHOLHDL 3.5 12/25/2017 0737   CHOLHDL 2.7 02/23/2016 0749   VLDL 24 02/23/2016 0749   LDLCALC 66 12/25/2017 0737     Other studies Reviewed: Additional studies/ records that were reviewed today with results demonstrating: .   ASSESSMENT AND PLAN:  1. AFib: Rate controlled today.  Increased when her pain is worse.  Coumadin for stroke prevention. 2. Atypical chest pain: Plan for lexiscan cardiolite given multiple RF for CAD.  Need to see LVEF as well.  If cardiolite high risk, she will need cath.  All questions answered. 3. Hypertensive heart disease: The current medical regimen is effective;  continue present plan and medications. 4. Carotid disease: Known PAD.  RF for CAD.  5. DM: A1C 8.2.  6. Hyperlipidemia: LDL 62.  The current medical regimen is effective;  continue present plan and medications.    Current medicines are reviewed at length with the patient today.  The patient concerns regarding her medicines were addressed.  The following changes have been made:  No change  Labs/ tests ordered today include: cardiolite No orders of the defined types were placed in this encounter.   Recommend 150 minutes/week of aerobic exercise Low fat, low carb, high fiber diet  recommended  Disposition:   FU in based on stress test   Signed, Larae Grooms, MD  04/22/2020 8:42 AM    [facility] [place], [place], [place]  [address] Phone: [phone]; Fax: [phone]

## 2020-04-22 ENCOUNTER — Other Ambulatory Visit: Payer: Self-pay

## 2020-04-22 ENCOUNTER — Ambulatory Visit: Payer: PPO | Admitting: Interventional Cardiology

## 2020-04-22 ENCOUNTER — Encounter: Payer: Self-pay | Admitting: Interventional Cardiology

## 2020-04-22 VITALS — BP 118/60 | HR 77 | Ht 69.0 in | Wt 188.0 lb

## 2020-04-22 DIAGNOSIS — I10 Essential (primary) hypertension: Secondary | ICD-10-CM | POA: Diagnosis not present

## 2020-04-22 DIAGNOSIS — H35033 Hypertensive retinopathy, bilateral: Secondary | ICD-10-CM | POA: Diagnosis not present

## 2020-04-22 DIAGNOSIS — I119 Hypertensive heart disease without heart failure: Secondary | ICD-10-CM

## 2020-04-22 DIAGNOSIS — I4891 Unspecified atrial fibrillation: Secondary | ICD-10-CM | POA: Diagnosis not present

## 2020-04-22 DIAGNOSIS — K219 Gastro-esophageal reflux disease without esophagitis: Secondary | ICD-10-CM | POA: Diagnosis not present

## 2020-04-22 DIAGNOSIS — I4821 Permanent atrial fibrillation: Secondary | ICD-10-CM

## 2020-04-22 DIAGNOSIS — R079 Chest pain, unspecified: Secondary | ICD-10-CM

## 2020-04-22 DIAGNOSIS — E1165 Type 2 diabetes mellitus with hyperglycemia: Secondary | ICD-10-CM | POA: Diagnosis not present

## 2020-04-22 DIAGNOSIS — I6523 Occlusion and stenosis of bilateral carotid arteries: Secondary | ICD-10-CM

## 2020-04-22 DIAGNOSIS — E1159 Type 2 diabetes mellitus with other circulatory complications: Secondary | ICD-10-CM

## 2020-04-22 DIAGNOSIS — Z720 Tobacco use: Secondary | ICD-10-CM

## 2020-04-22 DIAGNOSIS — N183 Chronic kidney disease, stage 3 unspecified: Secondary | ICD-10-CM | POA: Diagnosis not present

## 2020-04-22 DIAGNOSIS — E782 Mixed hyperlipidemia: Secondary | ICD-10-CM | POA: Diagnosis not present

## 2020-04-22 DIAGNOSIS — E1121 Type 2 diabetes mellitus with diabetic nephropathy: Secondary | ICD-10-CM | POA: Diagnosis not present

## 2020-04-22 DIAGNOSIS — N189 Chronic kidney disease, unspecified: Secondary | ICD-10-CM | POA: Diagnosis not present

## 2020-04-22 DIAGNOSIS — N184 Chronic kidney disease, stage 4 (severe): Secondary | ICD-10-CM | POA: Diagnosis not present

## 2020-04-22 DIAGNOSIS — E119 Type 2 diabetes mellitus without complications: Secondary | ICD-10-CM | POA: Diagnosis not present

## 2020-04-22 NOTE — Progress Notes (Unsigned)
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

## 2020-04-22 NOTE — Patient Instructions (Signed)
Medication Instructions:  Your physician recommends that you continue on your current medications as directed. Please refer to the Current Medication list given to you today.  *If you need a refill on your cardiac medications before your next appointment, please call your pharmacy*   Lab Work: None  If you have labs (blood work) drawn today and your tests are completely normal, you will receive your results only by: Marland Kitchen MyChart Message (if you have MyChart) OR . A paper copy in the mail If you have any lab test that is abnormal or we need to change your treatment, we will call you to review the results.   Testing/Procedures: Your physician has requested that you have a lexiscan myoview. For further information please visit [email or web address]. Please follow instruction sheet, as given.   Follow-Up: At [facility] you and your health needs are our priority.  As part of our continuing mission to provide you with exceptional heart care, we have created designated Provider Care Teams.  These Care Teams include your primary Cardiologist (physician) and Advanced Practice Providers (APPs -  Physician Assistants and Nurse Practitioners) who all work together to provide you with the care you need, when you need it.  We recommend signing up for the patient portal called "MyChart".  Sign up information is provided on this After Visit Summary.  MyChart is used to connect with patients for Virtual Visits (Telemedicine).  Patients are able to view lab/test results, encounter notes, upcoming appointments, etc.  Non-urgent messages can be sent to your provider as well.   To learn more about what you can do with MyChart, go to [email or web address].    Your next appointment:   Keep plan for follow up in July 2022  The format for your next appointment:   In Person  Provider:   You may see Larae Grooms, MD or one of the following Advanced Practice Providers on your designated Care Team:     Melina Copa, PA-C  Ermalinda Barrios, PA-C    Other Instructions None

## 2020-04-22 NOTE — Addendum Note (Signed)
Addended by: Drue Novel I on: 04/22/2020 11:46 AM   Modules accepted: Orders

## 2020-04-27 ENCOUNTER — Telehealth: Payer: Self-pay

## 2020-04-27 NOTE — Telephone Encounter (Signed)
Detailed instructions left on the patient's answering machine. Asked to call back with any questions. S.Olliver Boyadjian EMTP 

## 2020-05-03 ENCOUNTER — Telehealth (HOSPITAL_COMMUNITY): Payer: Self-pay

## 2020-05-03 DIAGNOSIS — Z7901 Long term (current) use of anticoagulants: Secondary | ICD-10-CM | POA: Diagnosis not present

## 2020-05-03 DIAGNOSIS — E1121 Type 2 diabetes mellitus with diabetic nephropathy: Secondary | ICD-10-CM | POA: Diagnosis not present

## 2020-05-03 DIAGNOSIS — I4821 Permanent atrial fibrillation: Secondary | ICD-10-CM | POA: Diagnosis not present

## 2020-05-03 DIAGNOSIS — M542 Cervicalgia: Secondary | ICD-10-CM | POA: Diagnosis not present

## 2020-05-03 DIAGNOSIS — Z9119 Patient's noncompliance with other medical treatment and regimen: Secondary | ICD-10-CM | POA: Diagnosis not present

## 2020-05-03 DIAGNOSIS — M549 Dorsalgia, unspecified: Secondary | ICD-10-CM | POA: Diagnosis not present

## 2020-05-03 DIAGNOSIS — E1165 Type 2 diabetes mellitus with hyperglycemia: Secondary | ICD-10-CM | POA: Diagnosis not present

## 2020-05-03 DIAGNOSIS — N184 Chronic kidney disease, stage 4 (severe): Secondary | ICD-10-CM | POA: Diagnosis not present

## 2020-05-03 DIAGNOSIS — S161XXA Strain of muscle, fascia and tendon at neck level, initial encounter: Secondary | ICD-10-CM | POA: Diagnosis not present

## 2020-05-03 DIAGNOSIS — E782 Mixed hyperlipidemia: Secondary | ICD-10-CM | POA: Diagnosis not present

## 2020-05-03 DIAGNOSIS — M25511 Pain in right shoulder: Secondary | ICD-10-CM | POA: Diagnosis not present

## 2020-05-03 DIAGNOSIS — I10 Essential (primary) hypertension: Secondary | ICD-10-CM | POA: Diagnosis not present

## 2020-05-03 NOTE — Telephone Encounter (Signed)
Spoke with the patient, detailed instructions given. She stated that she would be here for her test. Asked to call back with any questions. S.Cynthia Hansen EMTP 

## 2020-05-04 ENCOUNTER — Ambulatory Visit (HOSPITAL_COMMUNITY): Payer: PPO | Attending: Cardiovascular Disease

## 2020-05-04 ENCOUNTER — Other Ambulatory Visit: Payer: Self-pay

## 2020-05-04 ENCOUNTER — Ambulatory Visit (INDEPENDENT_AMBULATORY_CARE_PROVIDER_SITE_OTHER): Payer: PPO | Admitting: *Deleted

## 2020-05-04 DIAGNOSIS — I4891 Unspecified atrial fibrillation: Secondary | ICD-10-CM | POA: Diagnosis not present

## 2020-05-04 DIAGNOSIS — R079 Chest pain, unspecified: Secondary | ICD-10-CM

## 2020-05-04 DIAGNOSIS — Z5181 Encounter for therapeutic drug level monitoring: Secondary | ICD-10-CM | POA: Diagnosis not present

## 2020-05-04 LAB — MYOCARDIAL PERFUSION IMAGING
LV dias vol: 112 mL (ref 46–106)
LV sys vol: 83 mL
Peak HR: 82 {beats}/min
Rest HR: 82 {beats}/min
SDS: 10
SRS: 10
SSS: 19
TID: 1.07

## 2020-05-04 LAB — POCT INR: INR: 6.7 — AB (ref 2.0–3.0)

## 2020-05-04 LAB — PROTIME-INR
INR: 5.4 (ref 0.9–1.2)
Prothrombin Time: 53.7 s — ABNORMAL HIGH (ref 9.1–12.0)

## 2020-05-04 MED ORDER — REGADENOSON 0.4 MG/5ML IV SOLN
0.4000 mg | Freq: Once | INTRAVENOUS | Status: AC
  Administered 2020-05-04: 0.4 mg via INTRAVENOUS

## 2020-05-04 MED ORDER — TECHNETIUM TC 99M TETROFOSMIN IV KIT
28.3000 | PACK | Freq: Once | INTRAVENOUS | Status: AC | PRN
  Administered 2020-05-04: 28.3 via INTRAVENOUS
  Filled 2020-05-04: qty 29

## 2020-05-04 MED ORDER — TECHNETIUM TC 99M TETROFOSMIN IV KIT
8.9000 | PACK | Freq: Once | INTRAVENOUS | Status: AC | PRN
  Administered 2020-05-04: 8.9 via INTRAVENOUS
  Filled 2020-05-04: qty 9

## 2020-05-04 NOTE — Patient Instructions (Signed)
Description   Called and spoke with pt (pt and dtr on speaker phone) and instructed pt not take any Warfarin today and No Warfarin tomorrow then resume taking Warfarin 1/2 tablet daily except 1 tablet on Tuesday and Saturday. Continue eating serving of leafy veggies to your diet weekly. Recheck INR in 1 week. [facility]in Clinic [phone] Main [phone] Fax [phone]

## 2020-05-05 ENCOUNTER — Telehealth: Payer: Self-pay | Admitting: Interventional Cardiology

## 2020-05-05 DIAGNOSIS — R931 Abnormal findings on diagnostic imaging of heart and coronary circulation: Secondary | ICD-10-CM

## 2020-05-05 NOTE — Telephone Encounter (Signed)
-----   Message from Jettie Booze, MD sent at 05/04/2020  5:48 PM EST ----- No evidence of ischemia, but heart function appears weak.  Check echo as nuclear scan EF could be falsely abnormal due to AFib.  Echo would be more accurate.  If echo is abnormal, would consider a cath.    How is she feeling?

## 2020-05-05 NOTE — Telephone Encounter (Signed)
Pt c/o swelling: STAT is pt has developed SOB within 24 hours  1) How much weight have you gained and in what time span? No noticeable weight gain   2) If swelling, where is the swelling located? Feet and legs   3) Are you currently taking a fluid pill? No   4) Are you currently SOB? No   5) Do you have a log of your daily weights (if so, list)? No log available  6) Have you gained 3 pounds in a day or 5 pounds in a week? No   7) Have you traveled recently? No   Pt c/o Shortness Of Breath: STAT if SOB developed within the last 24 hours or pt is noticeably SOB on the phone  1. Are you currently SOB (can you hear that pt is SOB on the phone)? No   2. How long have you been experiencing SOB? 6 months  3. Are you SOB when sitting or when up moving around? When up and moving around   4. Are you currently experiencing any other symptoms? Dizziness   STAT if patient feels like he/she is going to faint   1) Are you dizzy now? No   2) Do you feel faint or have you passed out? No  3) Do you have any other symptoms? No   4) Have you checked your HR and BP (record if available)? Patient states her HR and BP are fine, but she does not keep a log   Patient states she has been experiencing these symptoms for about 6 months, however, she forgot to mention them during her most recent office visit.

## 2020-05-05 NOTE — Telephone Encounter (Signed)
Called and spoke to patient and made her aware of stress test results and recommendations for an echo to get a better assessment of her EF. Made her aware that if this comes back abnormal that the next step would be a cath. Echo scheduled for next available on 12/23.  Patient states that she forgot to tell Dr. Irish Lack at her last OV that she has been having swelling in her LE, SOB with exertion, and some dizziness as well for the past 6 months. She states that she does not check her BP/HR regularly but it has been stable at recent OVs. She denies syncope. She has been taking lasix 40 mg QD. Labs checked at PCP on Monday with Cr- 1.9 and K 6.1. She states that she is now CKD stage 4 and is trying to get in with the kidney MD. Instructed for patient to avoid salt in her diet, elevate legs, and wear compression stockings. Instructed for the patient to let us know if her Sx changed or worsened prior to her echo. Will forward to Dr. Irish Lack for review.

## 2020-05-06 NOTE — Telephone Encounter (Signed)
Agree with current recs.  Await echo result.

## 2020-05-10 ENCOUNTER — Telehealth: Payer: Self-pay | Admitting: Interventional Cardiology

## 2020-05-10 DIAGNOSIS — E875 Hyperkalemia: Secondary | ICD-10-CM | POA: Diagnosis not present

## 2020-05-10 NOTE — Telephone Encounter (Signed)
Pt c/o swelling: STAT is pt has developed SOB within 24 hours  1) How much weight have you gained and in what time span? 12 lbs in about 2.5 weeks  2) If swelling, where is the swelling located? Legs and feet  3) Are you currently taking a fluid pill? Yes   4) Are you currently SOB? No   5) Do you have a log of your daily weights (if so, list)? 04/22/20: 188 lbs 05/09/20: 200 lbs  6) Have you gained 3 pounds in a day or 5 pounds in a week? No   7) Have you traveled recently? No   Patient states her creatinine is at 1.92 and her potassium is at 6.21.

## 2020-05-10 NOTE — Telephone Encounter (Signed)
Called patient and reviewed Dr. Hassell Done advice. Patient verbalized understanding and agreement.  Dr. Dahlia Bailiff office [facility]) did the lab work that she reported and copies were supposed to be faxed to Dr. Irish Lack. She thanked me for the call.

## 2020-05-10 NOTE — Telephone Encounter (Signed)
Left message for patient to call back Will ask if she has been scheduled to see nephrologist.   Patient returned call and is scheduled to see nephrologist at 3:30 on 12/14. I advised that I am sending a request to our scheduler to find a sooner appointment for echo.  Weight is same today as yesterday and she admits to not eating a very healthy diet. States she is elevating legs and wearing compressions stockings. She has stopped her potassium supplement.  I advised that I will send message to Dr. Irish Lack and if he has additional advice that someone from our office will call her. She expressed gratitude for the call.

## 2020-05-10 NOTE — Telephone Encounter (Signed)
She can double her Lasix for 2 days.  This should help with volume and potassium.   Who checked her labs?  JV

## 2020-05-12 ENCOUNTER — Ambulatory Visit (INDEPENDENT_AMBULATORY_CARE_PROVIDER_SITE_OTHER): Payer: PPO | Admitting: *Deleted

## 2020-05-12 ENCOUNTER — Other Ambulatory Visit: Payer: Self-pay

## 2020-05-12 DIAGNOSIS — I4891 Unspecified atrial fibrillation: Secondary | ICD-10-CM

## 2020-05-12 DIAGNOSIS — Z5181 Encounter for therapeutic drug level monitoring: Secondary | ICD-10-CM

## 2020-05-12 LAB — PROTIME-INR
INR: 7.1 (ref 0.9–1.2)
Prothrombin Time: 70.8 s — ABNORMAL HIGH (ref 9.1–12.0)

## 2020-05-12 LAB — POCT INR: INR: 7.8 — AB (ref 2.0–3.0)

## 2020-05-12 NOTE — Progress Notes (Signed)
POC INR 7.8. Pt sent to lab to have LAB to have PT/inr drawn. Pt aware that she is at a higher risk for bleeding and too seek medical attention if she notices any bleeding. Pt aware hold warfarin until we call her today with INR results and updated dosing instructions.   Lab INR 7.1.

## 2020-05-12 NOTE — Patient Instructions (Addendum)
Description   Called and spoke to pt, instructed pt to hold warfarin on 12/8, 12/9, 12/10 and 12/11, then start taking 1/2 a tablet daily except for 1 tablet on Tuesdays. Recheck INR in 1 week. Coumadin Clinic [phone] Main [phone] Fax [phone]

## 2020-05-13 ENCOUNTER — Ambulatory Visit: Payer: PPO | Admitting: Interventional Cardiology

## 2020-05-13 ENCOUNTER — Ambulatory Visit
Admission: RE | Admit: 2020-05-13 | Discharge: 2020-05-13 | Disposition: A | Discharge status: Home / Self Care | Payer: PPO | Source: Ambulatory Visit | Attending: Interventional Cardiology | Admitting: Interventional Cardiology

## 2020-05-13 ENCOUNTER — Encounter: Payer: Self-pay | Admitting: Interventional Cardiology

## 2020-05-13 VITALS — BP 100/50 | HR 51 | Ht 69.0 in | Wt 197.2 lb

## 2020-05-13 DIAGNOSIS — I4821 Permanent atrial fibrillation: Secondary | ICD-10-CM | POA: Diagnosis not present

## 2020-05-13 DIAGNOSIS — I6523 Occlusion and stenosis of bilateral carotid arteries: Secondary | ICD-10-CM | POA: Diagnosis not present

## 2020-05-13 DIAGNOSIS — R0602 Shortness of breath: Secondary | ICD-10-CM | POA: Diagnosis not present

## 2020-05-13 DIAGNOSIS — R931 Abnormal findings on diagnostic imaging of heart and coronary circulation: Secondary | ICD-10-CM

## 2020-05-13 DIAGNOSIS — E1159 Type 2 diabetes mellitus with other circulatory complications: Secondary | ICD-10-CM | POA: Diagnosis not present

## 2020-05-13 DIAGNOSIS — I119 Hypertensive heart disease without heart failure: Secondary | ICD-10-CM

## 2020-05-13 DIAGNOSIS — Z720 Tobacco use: Secondary | ICD-10-CM | POA: Diagnosis not present

## 2020-05-13 NOTE — Progress Notes (Signed)
Cardiology Office Note   Date:  05/13/2020   ID:  Cynthia, Hansen February 13, 1941, MRN [id number]  PCP:  Aretta Nip, MD    No chief complaint on file.  AFib, fluid retention  Wt Readings from Last 3 Encounters:  05/13/20 197 lb 3.2 oz (89.4 kg)  05/04/20 188 lb (85.3 kg)  04/22/20 188 lb (85.3 kg)       History of Present Illness: Cynthia Hansen is a 79 y.o. female  who has a history of atrial fibrillation on warfarin. She underwent cardioversion back in 2007. This lasted for about a year but she reverted back to atrial fibrillation. She has been rate controlled since that time. She also has hypertension, hyperlipidemia, DM, smoker(1/2 ppd), and PVD with 50-69% RICA and patent Left carotid endarterectomy 10/22/14 followed by Dr. Oneida Alar.  She had lost 50 pounds over a 2 year period and maintained the wieght loss. She was exercising 3 days a week at [facility] cardiac rehabilitation and watching her diet. Dropped to 1-2 x/week.  She continued to smoke half a pack of cigarettes daily until 10/06/2015 when she quit. She restarted smoking with the stress of her husbands illness.   Shehad somechest discomfort in 3/18.She had anegative troponin andsx.resolved with belching.  She was diagnosed with ulcerative colitis. THis has improved with the medications started, mesalamine and colestipol.  Her husband passed away fromlung cancer.  In 11/21, she report: "she has been having ache in left arm. BP in coumadin clinic is 132/79 and heart rate 85. Oxygen sat 98%. I spoke with patient who reports ache has been going on for several months. Improves some with tylenol and hemp cream. Has not worsened recently. Thinks it may be neuropathy. No chest pain. Has been having back issues and receiving injections. Reviewed with Dr Angelena Form and no EKG or other changes needed at this time. Make Dr Irish Lack aware. Dr Angelena Form asked if patient had Covid vaccine or  booster shot recently as some people have been complaining of ongoing arm pain after receiving. Patient reports she did receive additional vaccine in August in left arm and thinks pain may have started shortly after that time.  Patient read PAD sign on wall in exam room and reports she has discoloringinlowerlegs. She does not have any pain when walking."  Denies : exertional Chest pain. Dizziness. Leg edema. Nitroglycerin use. Palpitations. Paroxysmal nocturnal dyspnea.  Syncope.   Reports some fluid overload.    Stress test showed low EF.    She continues to have decreased exercise tolerance.    Past Medical History:  Diagnosis Date  . A-fib ([place])   . Carotid artery occlusion   . Diabetes mellitus   . Hyperlipidemia   . Irregular heart beat   . Vitamin D deficiency     Past Surgical History:  Procedure Laterality Date  . ABDOMINAL HERNIA REPAIR    . CAROTID ENDARTERECTOMY Left Dec. 3, 2008    cea  . CATARACT EXTRACTION Left   . HYSTEROSCOPY WITH D & C N/A 07/30/2014   Procedure: DILATATION AND CURETTAGE /HYSTEROSCOPY;  Surgeon: Osborne Oman, MD;  Location: [facility];  Service: Gynecology;  Laterality: N/A;  . IUD REMOVAL N/A 07/30/2014   Procedure: INTRAUTERINE DEVICE (IUD) REMOVAL;  Surgeon: Osborne Oman, MD;  Location: [facility];  Service: Gynecology;  Laterality: N/A;     Current Outpatient Medications  Medication Sig Dispense Refill  . acetaminophen (TYLENOL) 500 MG tablet Take 1,000 mg by  mouth every 6 (six) hours as needed for mild pain (For back pain.).    Marland Kitchen atorvastatin (LIPITOR) 20 MG tablet TAKE 1 TABLET(20 MG) BY MOUTH DAILY 90 tablet 2  . colestipol (COLESTID) 1 g tablet Take 1 g by mouth 2 (two) times daily.    Marland Kitchen diltiazem (CARDIZEM CD) 180 MG 24 hr capsule TAKE 1 CAPSULE BY MOUTH TWICE DAILY BEFORE MEALS 180 capsule 3  . diphenoxylate-atropine (LOMOTIL) 2.5-0.025 MG tablet Take 1 tablet by mouth 2 (two) times daily as needed.    . fexofenadine (ALLEGRA)  180 MG tablet Take 180 mg by mouth daily.    Marland Kitchen FLUAD QUADRIVALENT 0.5 ML injection     . furosemide (LASIX) 40 MG tablet Take 40 mg by mouth daily.    Marland Kitchen glimepiride (AMARYL) 2 MG tablet TK 1 T PO BID  1  . HYDROcodone-acetaminophen (NORCO/VICODIN) 5-325 MG tablet Take 1 tablet by mouth every 6 (six) hours as needed for moderate pain or severe pain. Reported on 11/18/2015    . lisinopril (PRINIVIL,ZESTRIL) 5 MG tablet Take 2.5 mg by mouth daily.    . mesalamine (LIALDA) 1.2 g EC tablet Take 4.8 g by mouth daily with breakfast.    . ONE TOUCH ULTRA TEST test strip 1 each by Other route as needed (for BG testing).   5  . pseudoephedrine-guaifenesin (MUCINEX D) 60-600 MG 12 hr tablet Take 1 tablet by mouth every 12 (twelve) hours.    . Vitamin D, Ergocalciferol, (DRISDOL) 1.25 MG (50000 UNIT) CAPS capsule Take 50,000 Units by mouth once a week.    . warfarin (COUMADIN) 2.5 MG tablet Take 1/2 tablet daily except 1 tablet on Tuesdays and Saturdays or as directed 30 tablet 4  . gabapentin (NEURONTIN) 300 MG capsule Take 300 mg by mouth daily. (Patient not taking: Reported on 05/13/2020)     No current facility-administered medications for this visit.    Allergies:   Avandia [rosiglitazone], Latex, and Sulfa antibiotics    Social History:  The patient  reports that she quit smoking about 3 years ago. Her smoking use included cigarettes. She quit after 50.00 years of use. She has never used smokeless tobacco. She reports current alcohol use. She reports that she does not use drugs.   Family History:  The patient's family history includes Cancer in her mother; Heart attack in her father; Hypertension in her sister.    ROS:  Please see the history of present illness.   Otherwise, review of systems are positive for leg swelling.   All other systems are reviewed and negative.    PHYSICAL EXAM: VS:  BP (!) 100/50   Pulse (!) 51   Ht 5\' 9"  (1.753 m)   Wt 197 lb 3.2 oz (89.4 kg)   SpO2 98%   BMI 29.12  kg/m  , BMI Body mass index is 29.12 kg/m. GEN: Well nourished, well developed, in no acute distress  HEENT: normal  Neck: no JVD, carotid bruits, or masses Cardiac: RRR; no murmurs, rubs, or gallops,no edema  Respiratory:  clear to auscultation bilaterally, normal work of breathing GI: soft, nontender, nondistended, + BS MS: no deformity or atrophy  Skin: warm and dry, no rash Neuro:  Strength and sensation are intact Psych: euthymic mood, full affect   EKG:   The ekg ordered 7/21 demonstrates AFib, no ST segment changes   Recent Labs: 08/19/2019: Creatinine, Ser 1.80   Lipid Panel    Component Value Date/Time   CHOL 132 12/25/2017  0737   TRIG 141 12/25/2017 0737   HDL 38 (L) 12/25/2017 0737   CHOLHDL 3.5 12/25/2017 0737   CHOLHDL 2.7 02/23/2016 0749   VLDL 24 02/23/2016 0749   LDLCALC 66 12/25/2017 0737     Other studies Reviewed: Additional studies/ records that were reviewed today with results demonstrating: .   ASSESSMENT AND PLAN:  1. Low EF/ abnormal stress test/fluid retention/SHOB: WIll need to consider cardiac cath, R/ based on echo result.  If low EF, plan for cath.  Patient is agreeable.  2. AFib: Rate controlled.  3. ARF/CHF: Unclear of the cause.  Cr up to 1.9 with K 6.2.  Recheck BMet.  BNP given shortness of breath and volume overload.  Chest xray as well to look at lung fields and see if there is any big issue with the spine. She is following with orthopedics.  ? If ARF is related to low EF.  Echo to confirm low EF since it was only noted on nuclear scan which is sometimes inaccurate with AFib.  4. HTN heart disease: The current medical regimen is effective;  continue present plan and medications. 5. Carotid disease: Routine Dopplers. 6. DM: A1C 8.1 7. Tobacco abuse: She needs to avoid all tobacco products. 8. Hyperlipidemia: The current medical regimen is effective;  continue present plan and medications.    Current medicines are reviewed at length  with the patient today.  The patient concerns regarding her medicines were addressed.  The following changes have been made:  No change  Labs/ tests ordered today include:  No orders of the defined types were placed in this encounter.   Recommend 150 minutes/week of aerobic exercise Low fat, low carb, high fiber diet recommended  Disposition:   FU based on results   Signed, Larae Grooms, MD  05/13/2020 3:17 PM    [facility] [place], [place], [place]  [address] Phone: [phone]; Fax: [phone]

## 2020-05-13 NOTE — Patient Instructions (Signed)
Medication Instructions:  Your physician recommends that you continue on your current medications as directed. Please refer to the Current Medication list given to you today.  If you need a refill on your cardiac medications before your next appointment, please call your pharmacy.   Lab work: TODAY: CBC, BMET, BNP  If you have labs (blood work) drawn today and your tests are completely normal, you will receive your results only by: Marland Kitchen MyChart Message (if you have MyChart) OR . A paper copy in the mail If you have any lab test that is abnormal or we need to change your treatment, we will call you to review the results.  Testing/Procedures: Your echocardiogram has been rescheduled for tomorrow 05/14/20 at 7:30 AM. Please arrive at 7:15 AM  A chest x-ray takes a picture of the organs and structures inside the chest, including the heart, lungs, and blood vessels. This test can show several things, including, whether the heart is enlarges; whether fluid is building up in the lungs; and whether pacemaker / defibrillator leads are still in place.  Chest X-ray Instructions:    1. You may have this done at the [facility], located in the [place] on the 1st floor.    2. You do no have to have an appointment.    3. [place], [place] [address]        [phone]        Monday - Friday  8:00 am - 5:00 pm   Follow-Up: . Based on test results  Any Other Special Instructions Will Be Listed Below (If Applicable).

## 2020-05-14 ENCOUNTER — Ambulatory Visit (HOSPITAL_COMMUNITY): Payer: PPO | Attending: Cardiology

## 2020-05-14 ENCOUNTER — Other Ambulatory Visit: Payer: Self-pay

## 2020-05-14 DIAGNOSIS — Z87891 Personal history of nicotine dependence: Secondary | ICD-10-CM | POA: Diagnosis not present

## 2020-05-14 DIAGNOSIS — R931 Abnormal findings on diagnostic imaging of heart and coronary circulation: Secondary | ICD-10-CM | POA: Diagnosis not present

## 2020-05-14 DIAGNOSIS — I10 Essential (primary) hypertension: Secondary | ICD-10-CM | POA: Insufficient documentation

## 2020-05-14 DIAGNOSIS — E119 Type 2 diabetes mellitus without complications: Secondary | ICD-10-CM | POA: Insufficient documentation

## 2020-05-14 DIAGNOSIS — I4821 Permanent atrial fibrillation: Secondary | ICD-10-CM | POA: Diagnosis not present

## 2020-05-14 DIAGNOSIS — I4891 Unspecified atrial fibrillation: Secondary | ICD-10-CM | POA: Diagnosis not present

## 2020-05-14 DIAGNOSIS — E785 Hyperlipidemia, unspecified: Secondary | ICD-10-CM | POA: Diagnosis not present

## 2020-05-14 DIAGNOSIS — I081 Rheumatic disorders of both mitral and tricuspid valves: Secondary | ICD-10-CM | POA: Insufficient documentation

## 2020-05-14 LAB — CBC
Hematocrit: 30.3 % — ABNORMAL LOW (ref 34.0–46.6)
Hemoglobin: 9.7 g/dL — ABNORMAL LOW (ref 11.1–15.9)
MCH: 30.7 pg (ref 26.6–33.0)
MCHC: 32 g/dL (ref 31.5–35.7)
MCV: 96 fL (ref 79–97)
Platelets: 291 10*3/uL (ref 150–450)
RBC: 3.16 x10E6/uL — ABNORMAL LOW (ref 3.77–5.28)
RDW: 13.3 % (ref 11.7–15.4)
WBC: 8.2 10*3/uL (ref 3.4–10.8)

## 2020-05-14 LAB — BASIC METABOLIC PANEL
BUN/Creatinine Ratio: 24 (ref 12–28)
BUN: 72 mg/dL — ABNORMAL HIGH (ref 8–27)
CO2: 17 mmol/L — ABNORMAL LOW (ref 20–29)
Calcium: 8.5 mg/dL — ABNORMAL LOW (ref 8.7–10.3)
Chloride: 109 mmol/L — ABNORMAL HIGH (ref 96–106)
Creatinine, Ser: 2.98 mg/dL — ABNORMAL HIGH (ref 0.57–1.00)
GFR calc Af Amer: 17 mL/min/{1.73_m2} — ABNORMAL LOW (ref >59)
GFR calc non Af Amer: 14 mL/min/{1.73_m2} — ABNORMAL LOW (ref >59)
Glucose: 161 mg/dL — ABNORMAL HIGH (ref 65–99)
Potassium: 5.8 mmol/L (ref 3.5–5.2)
Sodium: 139 mmol/L (ref 134–144)

## 2020-05-14 LAB — ECHOCARDIOGRAM COMPLETE
Area-P 1/2: 2.26 cm2
S' Lateral: 4.7 cm

## 2020-05-14 LAB — PRO B NATRIURETIC PEPTIDE: NT-Pro BNP: 46357 pg/mL — ABNORMAL HIGH (ref 0–738)

## 2020-05-14 NOTE — Progress Notes (Signed)
Renal failure appears to be worsening.  Will forward to Dr. Marval Regal.  May need a cath at some point to eval LV function, but would like to see renal function stabilizing before we pursue.  Volume overloaded; CXR not read but appears that there is a right sided effusion.  Will need to consider next steps with renal re: diuretic dosing.

## 2020-05-18 ENCOUNTER — Telehealth: Payer: Self-pay | Admitting: Interventional Cardiology

## 2020-05-18 DIAGNOSIS — R0602 Shortness of breath: Secondary | ICD-10-CM

## 2020-05-18 NOTE — Telephone Encounter (Signed)
Pt c/o swelling: STAT is pt has developed SOB within 24 hours  1) How much weight have you gained and in what time span? 12 lbs since visit with Dr. Irish Lack   2) If swelling, where is the swelling located? Midsection, feet, legs, ankles  3) Are you currently taking a fluid pill?  furosemide (LASIX) 40 MG tablet once daily.Pt said last Tues and Wed they advised her to take 40 mg 2x daily and then back to 1x daily on Friday.    4) Are you currently SOB? Yes   5) Do you have a log of your daily weights (if so, list)?   05/18/20: 200  05/13/20: 197  05/10/20: 198  05/05/20: 200  04/22/20: 188    6) Have you gained 3 pounds in a day or 5 pounds in a week?   7) Have you traveled recently? No   Pt c/o Shortness Of Breath: STAT if SOB developed within the last 24 hours or pt is noticeably SOB on the phone  1. Are you currently SOB (can you hear that pt is SOB on the phone)? Yes, but patient just came in the house  2. How long have you been experiencing SOB? ~ 2 weeks  3. Are you SOB when sitting or when up moving around? With movement  4. Are you currently experiencing any other symptoms? No  Patient states she is coming to get her Coumadin checked tomorrow and wants Dr. Irish Lack and the staff to be aware of her symptoms. She is not sure what to do medication wise. Please advise

## 2020-05-19 ENCOUNTER — Other Ambulatory Visit: Payer: Self-pay

## 2020-05-19 ENCOUNTER — Ambulatory Visit (INDEPENDENT_AMBULATORY_CARE_PROVIDER_SITE_OTHER): Payer: PPO | Admitting: *Deleted

## 2020-05-19 DIAGNOSIS — I4891 Unspecified atrial fibrillation: Secondary | ICD-10-CM | POA: Diagnosis not present

## 2020-05-19 DIAGNOSIS — Z5181 Encounter for therapeutic drug level monitoring: Secondary | ICD-10-CM | POA: Diagnosis not present

## 2020-05-19 LAB — POCT INR: INR: 3.2 — AB (ref 2.0–3.0)

## 2020-05-19 MED ORDER — FUROSEMIDE 40 MG PO TABS
40.0000 mg | ORAL_TABLET | Freq: Two times a day (BID) | ORAL | 3 refills | Status: DC
Start: 2020-05-19 — End: 2020-06-16

## 2020-05-19 NOTE — Patient Instructions (Signed)
Description   Hold warfarin today and then start taking 1/2 a tablet daily. Recheck INR in 1 week. [facility] Clinic [phone] Main [phone] Fax [phone]

## 2020-05-19 NOTE — Telephone Encounter (Signed)
Called and spoke to patient and made her aware that we have not heard back from Dr. Bing Ree. She states that she was suppose to have an appointment with him yesterday but mixed the times and up and was unable to see him. She was rescheduled to see Dr. Joylene Grapes on 06/08/19. Per Dr. Irish Lack, the patient will increase the lasix to 40 mg BID. We will plan to repeat BMET on 12/22 when she comes in for her coumadin check.

## 2020-05-21 DIAGNOSIS — M5136 Other intervertebral disc degeneration, lumbar region: Secondary | ICD-10-CM | POA: Diagnosis not present

## 2020-05-21 DIAGNOSIS — M542 Cervicalgia: Secondary | ICD-10-CM | POA: Diagnosis not present

## 2020-05-26 ENCOUNTER — Other Ambulatory Visit: Payer: Self-pay

## 2020-05-26 ENCOUNTER — Ambulatory Visit (INDEPENDENT_AMBULATORY_CARE_PROVIDER_SITE_OTHER): Payer: PPO | Admitting: *Deleted

## 2020-05-26 ENCOUNTER — Other Ambulatory Visit: Payer: PPO | Admitting: *Deleted

## 2020-05-26 DIAGNOSIS — Z5181 Encounter for therapeutic drug level monitoring: Secondary | ICD-10-CM

## 2020-05-26 DIAGNOSIS — I4891 Unspecified atrial fibrillation: Secondary | ICD-10-CM | POA: Diagnosis not present

## 2020-05-26 DIAGNOSIS — R0602 Shortness of breath: Secondary | ICD-10-CM | POA: Diagnosis not present

## 2020-05-26 LAB — POCT INR: INR: 3.9 — AB (ref 2.0–3.0)

## 2020-05-26 MED ORDER — WARFARIN SODIUM 1 MG PO TABS: ORAL_TABLET | ORAL | 0 refills | Status: DC

## 2020-05-26 NOTE — Patient Instructions (Addendum)
Description    Stop taking your warfarin 2.5 mg tablets and start using warfarin 1 mg tablets.Hold warfarin today and then start taking warfarin 1 tablet daily (1mg ) except for 1.5 tablets (1.5 mg) on Fridays. Recheck INR in 1 week. Cou[facility]in Clinic [phone] Main [phone] Fax [phone]

## 2020-05-27 ENCOUNTER — Other Ambulatory Visit (HOSPITAL_COMMUNITY): Payer: PPO

## 2020-05-27 ENCOUNTER — Emergency Department (HOSPITAL_COMMUNITY)
Admission: EM | Admit: 2020-05-27 | Discharge: 2020-05-27 | Disposition: A | Discharge status: Home / Self Care | Payer: PPO | Attending: Emergency Medicine | Admitting: Emergency Medicine

## 2020-05-27 ENCOUNTER — Other Ambulatory Visit: Payer: Self-pay | Admitting: Cardiology

## 2020-05-27 ENCOUNTER — Other Ambulatory Visit: Payer: Self-pay

## 2020-05-27 ENCOUNTER — Encounter (HOSPITAL_COMMUNITY): Payer: Self-pay | Admitting: Emergency Medicine

## 2020-05-27 DIAGNOSIS — R0602 Shortness of breath: Secondary | ICD-10-CM | POA: Insufficient documentation

## 2020-05-27 DIAGNOSIS — I10 Essential (primary) hypertension: Secondary | ICD-10-CM | POA: Diagnosis not present

## 2020-05-27 DIAGNOSIS — R799 Abnormal finding of blood chemistry, unspecified: Secondary | ICD-10-CM | POA: Diagnosis present

## 2020-05-27 DIAGNOSIS — E119 Type 2 diabetes mellitus without complications: Secondary | ICD-10-CM | POA: Insufficient documentation

## 2020-05-27 DIAGNOSIS — Z7984 Long term (current) use of oral hypoglycemic drugs: Secondary | ICD-10-CM | POA: Diagnosis not present

## 2020-05-27 DIAGNOSIS — I4891 Unspecified atrial fibrillation: Secondary | ICD-10-CM | POA: Diagnosis not present

## 2020-05-27 DIAGNOSIS — Z91040 Latex allergy status: Secondary | ICD-10-CM | POA: Diagnosis not present

## 2020-05-27 DIAGNOSIS — E875 Hyperkalemia: Secondary | ICD-10-CM

## 2020-05-27 DIAGNOSIS — Z79899 Other long term (current) drug therapy: Secondary | ICD-10-CM | POA: Insufficient documentation

## 2020-05-27 DIAGNOSIS — Z87891 Personal history of nicotine dependence: Secondary | ICD-10-CM | POA: Diagnosis not present

## 2020-05-27 DIAGNOSIS — Z7901 Long term (current) use of anticoagulants: Secondary | ICD-10-CM | POA: Insufficient documentation

## 2020-05-27 LAB — BASIC METABOLIC PANEL
Anion gap: 12 (ref 5–15)
BUN/Creatinine Ratio: 29 — ABNORMAL HIGH (ref 12–28)
BUN: 65 mg/dL — ABNORMAL HIGH (ref 8–27)
BUN: 75 mg/dL — ABNORMAL HIGH (ref 8–23)
CO2: 17 mmol/L — ABNORMAL LOW (ref 20–29)
CO2: 17 mmol/L — ABNORMAL LOW (ref 22–32)
Calcium: 9 mg/dL (ref 8.7–10.3)
Calcium: 8.8 mg/dL — ABNORMAL LOW (ref 8.9–10.3)
Chloride: 108 mmol/L — ABNORMAL HIGH (ref 96–106)
Chloride: 108 mmol/L (ref 98–111)
Creatinine, Ser: 2.26 mg/dL — ABNORMAL HIGH (ref 0.57–1.00)
Creatinine, Ser: 2.35 mg/dL — ABNORMAL HIGH (ref 0.44–1.00)
GFR calc Af Amer: 23 mL/min/{1.73_m2} — ABNORMAL LOW (ref >59)
GFR calc non Af Amer: 20 mL/min/{1.73_m2} — ABNORMAL LOW (ref >59)
GFR, Estimated: 21 mL/min — ABNORMAL LOW (ref >60)
Glucose, Bld: 194 mg/dL — ABNORMAL HIGH (ref 70–99)
Glucose: 166 mg/dL — ABNORMAL HIGH (ref 65–99)
Potassium: 5.9 mmol/L (ref 3.5–5.2)
Potassium: 5.8 mmol/L — ABNORMAL HIGH (ref 3.5–5.1)
Sodium: 141 mmol/L (ref 134–144)
Sodium: 137 mmol/L (ref 135–145)

## 2020-05-27 LAB — CBC WITH DIFFERENTIAL/PLATELET
Abs Immature Granulocytes: 0.01 10*3/uL (ref 0.00–0.07)
Basophils Absolute: 0 10*3/uL (ref 0.0–0.1)
Basophils Relative: 0 %
Eosinophils Absolute: 0 10*3/uL (ref 0.0–0.5)
Eosinophils Relative: 0 %
HCT: 32.2 % — ABNORMAL LOW (ref 36.0–46.0)
Hemoglobin: 9.8 g/dL — ABNORMAL LOW (ref 12.0–15.0)
Immature Granulocytes: 0 %
Lymphocytes Relative: 9 %
Lymphs Abs: 0.7 10*3/uL (ref 0.7–4.0)
MCH: 30.6 pg (ref 26.0–34.0)
MCHC: 30.4 g/dL (ref 30.0–36.0)
MCV: 100.6 fL — ABNORMAL HIGH (ref 80.0–100.0)
Monocytes Absolute: 0.9 10*3/uL (ref 0.1–1.0)
Monocytes Relative: 10 %
Neutro Abs: 7.1 10*3/uL (ref 1.7–7.7)
Neutrophils Relative %: 81 %
Platelets: 318 10*3/uL (ref 150–400)
RBC: 3.2 MIL/uL — ABNORMAL LOW (ref 3.87–5.11)
RDW: 15 % (ref 11.5–15.5)
WBC: 8.8 10*3/uL (ref 4.0–10.5)
nRBC: 0 % (ref 0.0–0.2)

## 2020-05-27 LAB — PROTIME-INR
INR: 3.5 — ABNORMAL HIGH (ref 0.8–1.2)
Prothrombin Time: 33.9 seconds — ABNORMAL HIGH (ref 11.4–15.2)

## 2020-05-27 LAB — BRAIN NATRIURETIC PEPTIDE: B Natriuretic Peptide: 2213.1 pg/mL — ABNORMAL HIGH (ref 0.0–100.0)

## 2020-05-27 MED ORDER — LOKELMA 10 G PO PACK: 10.0000 g | PACK | Freq: Once | ORAL | 0 refills | Status: AC

## 2020-05-27 MED ORDER — FUROSEMIDE 10 MG/ML IJ SOLN
40.0000 mg | Freq: Once | INTRAMUSCULAR | Status: AC
  Administered 2020-05-27: 10:00:00 40 mg via INTRAVENOUS
  Filled 2020-05-27: qty 4

## 2020-05-27 NOTE — Discharge Instructions (Signed)
Have lab done on Monday at [facility].   Call your primary care doctor or specialist as discussed in the next 2-3 days.    Increase your Lasix dosage to 3 times a day just for the next 3 days.  Return immediately back to the ER if:  1. Your symptoms worsen within the next 12-24 hours. 2. You develop new symptoms such as new fevers, persistent vomiting, new pain, shortness of breath, or new weakness or numbness, or if you have any other concerns.

## 2020-05-27 NOTE — ED Triage Notes (Addendum)
Sentt by PCP D/T high potassium, patient stated she is in stage 4 kidney failure and has noted bilateral leg swelling the past few days as well as SOB. L leg is markedly more swollen than the right with +4 putting edema, R foot with +3 edema.

## 2020-05-27 NOTE — ED Provider Notes (Signed)
[facility] DEPT Provider Note   CSN: [id number] Arrival date & time: 05/27/20  0741     History Chief Complaint  Patient presents with   Abnormal Lab    Cynthia Hansen is a 79 y.o. female.  Patient with history of A. fib on Coumadin and kidney disease, presents with increasing potassium levels.  She states that her Lasix dosage has been increased to double, she also was on oral potassium supplementation which was stopped 2 weeks ago.  However her cardiologist noted her potassium level continues to be trending upwards at 5.9 yesterday.  She was sent to the ER for further evaluation.  Patient also states that her legs bilaterally have been more swollen over the course the last few weeks, however in the last couple of days her swelling has improved.  Denies chest pain, complains of intermittent shortness of breath with exertion.        Past Medical History:  Diagnosis Date   A-fib [facility])    Carotid artery occlusion    Diabetes mellitus    Hyperlipidemia    Irregular heart beat    Vitamin D deficiency     Patient Active Problem List   Diagnosis Date Noted   Carotid artery disease ([place]) 10/27/2015   Encounter for therapeutic drug monitoring 02/23/2015   Neck pain on left side 10/22/2014   Numbness-Left neck 10/22/2014   Abnormal screening mammogram 07/27/2014   Postmenopausal bleeding 07/13/2014   IUD (intrauterine device) in place for over 20 years 07/13/2014   Carotid stenosis 04/10/2014   Aftercare following surgery of the circulatory system 04/10/2014   Essential hypertension, benign 07/08/2013   Mixed hyperlipidemia 07/08/2013   Tobacco use disorder 07/08/2013   Occlusion and stenosis of carotid artery without mention of cerebral infarction 08/01/2012   Iron deficiency anemia    Diabetes mellitus ([place])    Hyperlipidemia    A-fib ([facility])    Vitamin D deficiency     Past Surgical History:  Procedure  Laterality Date   ABDOMINAL HERNIA REPAIR     CAROTID ENDARTERECTOMY Left Dec. 3, 2008    cea   CATARACT EXTRACTION Left    HYSTEROSCOPY WITH D & C N/A 07/30/2014   Procedure: DILATATION AND CURETTAGE /HYSTEROSCOPY;  Surgeon: Osborne Oman, MD;  Location: [facility];  Service: Gynecology;  Laterality: N/A;   IUD REMOVAL N/A 07/30/2014   Procedure: INTRAUTERINE DEVICE (IUD) REMOVAL;  Surgeon: Osborne Oman, MD;  Location: [facility];  Service: Gynecology;  Laterality: N/A;     OB History    Gravida  1   Para  1   Term  1   Preterm      AB      Living  1     SAB      IAB      Ectopic      Multiple      Live Births              Family History  Problem Relation Age of Onset   Cancer Mother        Uterin   Heart attack Father    Hypertension Sister     Social History   Tobacco Use   Smoking status: Former Smoker    Years: 50.00    Types: Cigarettes    Quit date: 10/05/2016    Years since quitting: 3.6   Smokeless tobacco: Never Used  Vaping Use   Vaping Use: Never used  Substance  Use Topics   Alcohol use: Yes    Alcohol/week: 0.0 standard drinks   Drug use: No    Home Medications Prior to Admission medications   Medication Sig Start Date End Date Taking? Authorizing Provider  acetaminophen (TYLENOL) 500 MG tablet Take 1,000 mg by mouth every 6 (six) hours as needed for mild pain (For back pain.).    [provider]  atorvastatin (LIPITOR) 20 MG tablet TAKE 1 TABLET(20 MG) BY MOUTH DAILY 01/27/20   Jettie Booze, MD  colestipol (COLESTID) 1 g tablet Take 1 g by mouth 2 (two) times daily.    [provider]  diltiazem (CARDIZEM CD) 180 MG 24 hr capsule TAKE 1 CAPSULE BY MOUTH TWICE DAILY BEFORE MEALS 02/10/20   Jettie Booze, MD  diphenoxylate-atropine (LOMOTIL) 2.5-0.025 MG tablet Take 1 tablet by mouth 2 (two) times daily as needed. 12/18/19   [provider]  fexofenadine (ALLEGRA) 180 MG tablet Take 180  mg by mouth daily.    [provider]  FLUAD QUADRIVALENT 0.5 ML injection  03/15/20   [provider]  furosemide (LASIX) 40 MG tablet Take 1 tablet (40 mg total) by mouth 2 (two) times daily. 05/19/20   Jettie Booze, MD  gabapentin (NEURONTIN) 300 MG capsule Take 300 mg by mouth daily. Patient not taking: Reported on 05/13/2020    [provider]  glimepiride (AMARYL) 2 MG tablet TK 1 T PO BID 03/25/18   [provider]  HYDROcodone-acetaminophen (NORCO/VICODIN) 5-325 MG tablet Take 1 tablet by mouth every 6 (six) hours as needed for moderate pain or severe pain. Reported on 11/18/2015    [provider]  lisinopril (PRINIVIL,ZESTRIL) 5 MG tablet Take 2.5 mg by mouth daily. 07/13/12   [provider]  mesalamine (LIALDA) 1.2 g EC tablet Take 4.8 g by mouth daily with breakfast.    [provider]  ONE TOUCH ULTRA TEST test strip 1 each by Other route as needed (for BG testing).  03/22/14   [provider]  pseudoephedrine-guaifenesin (MUCINEX D) 60-600 MG 12 hr tablet Take 1 tablet by mouth every 12 (twelve) hours.    [provider]  sodium zirconium cyclosilicate (LOKELMA) 10 g PACK packet Take 10 g by mouth once for 1 dose. 05/27/20 05/27/20  Luna Fuse, MD  Vitamin D, Ergocalciferol, (DRISDOL) 1.25 MG (50000 UNIT) CAPS capsule Take 50,000 Units by mouth once a week. 07/31/19   [provider]  warfarin (COUMADIN) 1 MG tablet Take 1 tablet to 1 and 1/2 tablets by mouth daily as directed by the coumadin clinic. 05/26/20   Jettie Booze, MD    Allergies    Avandia [rosiglitazone], Latex, and Sulfa antibiotics  Review of Systems   Review of Systems  Constitutional: Negative for fever.  HENT: Negative for ear pain.   Eyes: Negative for pain.  Respiratory: Negative for cough.   Cardiovascular: Negative for chest pain.  Gastrointestinal: Negative for abdominal pain.  Genitourinary:  Negative for flank pain.  Musculoskeletal: Negative for back pain.  Skin: Negative for rash.  Neurological: Negative for headaches.    Physical Exam Updated Vital Signs BP 135/67    Pulse 83    Temp (!) 97.4 F (36.3 C) (Oral) Comment (Src): RN is aware of tem Pt stated this is her normal    Resp (!) 24    SpO2 99%   Physical Exam Constitutional:      General: She is not in acute distress.  Appearance: Normal appearance.  HENT:     Head: Normocephalic.     Nose: Nose normal.  Eyes:     Extraocular Movements: Extraocular movements intact.  Cardiovascular:     Rate and Rhythm: Normal rate.  Pulmonary:     Effort: Pulmonary effort is normal.  Abdominal:     Palpations: Abdomen is soft.     Tenderness: There is no abdominal tenderness.  Musculoskeletal:        General: Normal range of motion.     Cervical back: Normal range of motion.     Right lower leg: Edema present.     Left lower leg: Edema present.  Skin:    General: Skin is warm.  Neurological:     General: No focal deficit present.     Mental Status: She is alert.     ED Results / Procedures / Treatments   Labs (all labs ordered are listed, but only abnormal results are displayed) Labs Reviewed  CBC WITH DIFFERENTIAL/PLATELET - Abnormal; Notable for the following components:      Result Value   RBC 3.20 (*)    Hemoglobin 9.8 (*)    HCT 32.2 (*)    MCV 100.6 (*)    All other components within normal limits  BASIC METABOLIC PANEL - Abnormal; Notable for the following components:   Potassium 5.8 (*)    CO2 17 (*)    Glucose, Bld 194 (*)    BUN 75 (*)    Creatinine, Ser 2.35 (*)    Calcium 8.8 (*)    GFR, Estimated 21 (*)    All other components within normal limits  BRAIN NATRIURETIC PEPTIDE - Abnormal; Notable for the following components:   B Natriuretic Peptide 2,213.1 (*)    All other components within normal limits  PROTIME-INR - Abnormal; Notable for the following components:   Prothrombin Time  33.9 (*)    INR 3.5 (*)    All other components within normal limits    EKG EKG Interpretation  Date/Time:  Thursday May 27 2020 08:27:09 EST Ventricular Rate:  72 PR Interval:    QRS Duration: 98 QT Interval:  392 QTC Calculation: 429 R Axis:   112 Text Interpretation: Atrial fibrillation Ventricular premature complex Anterolateral infarct, age indeterminate Confirmed by Thamas Jaegers ([id number]) on 05/27/2020 8:57:39 AM   Radiology No results found.  Procedures Procedures (including critical care time)  Medications Ordered in ED Medications  furosemide (LASIX) injection 40 mg (has no administration in time range)    ED Course  I have reviewed the triage vital signs and the nursing notes.  Pertinent labs & imaging results that were available during my care of the patient were reviewed by me and considered in my medical decision making (see chart for details).    MDM Rules/Calculators/A&P                          Vital signs on arrival within normal limits 96% on room air which is appropriate.  Patient otherwise appears comfortable without any respite distress while sitting in bed.  White count 8.8, hemoglobin 9.8.  Chemistry otherwise appears unremarkable.  Potassium again elevated at 5.8 similar to prior levels. Patient will be given a dose of Lokelma to go home with. Advised increasing her Lasix just for the next 3 days.  Case discussed with on-call cardiology to arrange close follow-up for the patient on outpatient basis.  Advised immediate return if she has  chest pain difficulty breathing or any additional concerns.  Final Clinical Impression(s) / ED Diagnoses Final diagnoses:  Hyperkalemia    Rx / DC Orders ED Discharge Orders         Ordered    sodium zirconium cyclosilicate (LOKELMA) 10 g PACK packet   Once        05/27/20 1021           Luna Fuse, MD 05/27/20 1021

## 2020-05-31 ENCOUNTER — Other Ambulatory Visit: Payer: Self-pay

## 2020-05-31 ENCOUNTER — Telehealth: Payer: Self-pay | Admitting: Interventional Cardiology

## 2020-05-31 ENCOUNTER — Other Ambulatory Visit: Payer: PPO | Admitting: *Deleted

## 2020-05-31 DIAGNOSIS — R0602 Shortness of breath: Secondary | ICD-10-CM

## 2020-05-31 DIAGNOSIS — E875 Hyperkalemia: Secondary | ICD-10-CM

## 2020-05-31 LAB — BASIC METABOLIC PANEL
BUN/Creatinine Ratio: 28 (ref 12–28)
BUN: 60 mg/dL — ABNORMAL HIGH (ref 8–27)
CO2: 17 mmol/L — ABNORMAL LOW (ref 20–29)
Calcium: 9.3 mg/dL (ref 8.7–10.3)
Chloride: 100 mmol/L (ref 96–106)
Creatinine, Ser: 2.13 mg/dL — ABNORMAL HIGH (ref 0.57–1.00)
GFR calc Af Amer: 25 mL/min/{1.73_m2} — ABNORMAL LOW (ref >59)
GFR calc non Af Amer: 22 mL/min/{1.73_m2} — ABNORMAL LOW (ref >59)
Glucose: 115 mg/dL — ABNORMAL HIGH (ref 65–99)
Potassium: 4.8 mmol/L (ref 3.5–5.2)
Sodium: 137 mmol/L (ref 134–144)

## 2020-05-31 LAB — PRO B NATRIURETIC PEPTIDE: NT-Pro BNP: 48555 pg/mL — ABNORMAL HIGH (ref 0–738)

## 2020-05-31 NOTE — Telephone Encounter (Signed)
Pt c/o medication issue:  1. Name of Medication: lokelma 10 g  2. How are you currently taking this medication (dosage and times per day)? Drink 3 tbs into water  3. Are you having a reaction (difficulty breathing--STAT)? no  4. What is your medication issue? Patient states she was given the medication in the hospital and it was supposed to give her diarrhea, but she has not had it. She states she is not sure if it worked, but she has lost 10 lbs in 4 days.

## 2020-05-31 NOTE — Telephone Encounter (Signed)
Called and spoke to patient. She states that she was in the ER for hyperkalemia and was prescribed lokelma. She states that she has not had any diarrhea. She denies chest pain, palpitations, or any other Sx. She states that her SOB has improved being on the higher dose of lasix. She did get her labs checked today (BMET and BNP) and we will wait to see what they show. Patient verbalized understanding and thanked me for the call.

## 2020-06-02 ENCOUNTER — Other Ambulatory Visit: Payer: Self-pay

## 2020-06-02 ENCOUNTER — Encounter: Payer: Self-pay | Admitting: Cardiology

## 2020-06-02 ENCOUNTER — Inpatient Hospital Stay (HOSPITAL_COMMUNITY): Payer: PPO

## 2020-06-02 ENCOUNTER — Inpatient Hospital Stay (HOSPITAL_COMMUNITY)
Admission: AD | Admit: 2020-06-02 | Discharge: 2020-06-16 | DRG: 222 | Disposition: A | Discharge status: Rehabilitation Facility | Payer: PPO | Source: Ambulatory Visit | Attending: Cardiology | Admitting: Cardiology

## 2020-06-02 ENCOUNTER — Encounter (HOSPITAL_COMMUNITY): Payer: Self-pay | Admitting: Cardiology

## 2020-06-02 ENCOUNTER — Ambulatory Visit: Payer: PPO | Admitting: Cardiology

## 2020-06-02 DIAGNOSIS — Z8616 Personal history of COVID-19: Secondary | ICD-10-CM

## 2020-06-02 DIAGNOSIS — Z95810 Presence of automatic (implantable) cardiac defibrillator: Secondary | ICD-10-CM

## 2020-06-02 DIAGNOSIS — I272 Pulmonary hypertension, unspecified: Secondary | ICD-10-CM | POA: Diagnosis not present

## 2020-06-02 DIAGNOSIS — N189 Chronic kidney disease, unspecified: Secondary | ICD-10-CM | POA: Diagnosis not present

## 2020-06-02 DIAGNOSIS — Z7901 Long term (current) use of anticoagulants: Secondary | ICD-10-CM

## 2020-06-02 DIAGNOSIS — R5381 Other malaise: Secondary | ICD-10-CM | POA: Diagnosis not present

## 2020-06-02 DIAGNOSIS — D631 Anemia in chronic kidney disease: Secondary | ICD-10-CM | POA: Diagnosis not present

## 2020-06-02 DIAGNOSIS — I472 Ventricular tachycardia, unspecified: Secondary | ICD-10-CM

## 2020-06-02 DIAGNOSIS — E8809 Other disorders of plasma-protein metabolism, not elsewhere classified: Secondary | ICD-10-CM | POA: Diagnosis not present

## 2020-06-02 DIAGNOSIS — I252 Old myocardial infarction: Secondary | ICD-10-CM | POA: Diagnosis not present

## 2020-06-02 DIAGNOSIS — J690 Pneumonitis due to inhalation of food and vomit: Secondary | ICD-10-CM | POA: Diagnosis not present

## 2020-06-02 DIAGNOSIS — R0602 Shortness of breath: Secondary | ICD-10-CM | POA: Diagnosis not present

## 2020-06-02 DIAGNOSIS — E785 Hyperlipidemia, unspecified: Secondary | ICD-10-CM | POA: Diagnosis present

## 2020-06-02 DIAGNOSIS — I469 Cardiac arrest, cause unspecified: Secondary | ICD-10-CM | POA: Diagnosis not present

## 2020-06-02 DIAGNOSIS — I255 Ischemic cardiomyopathy: Secondary | ICD-10-CM | POA: Diagnosis present

## 2020-06-02 DIAGNOSIS — E1122 Type 2 diabetes mellitus with diabetic chronic kidney disease: Secondary | ICD-10-CM | POA: Diagnosis present

## 2020-06-02 DIAGNOSIS — I9589 Other hypotension: Secondary | ICD-10-CM

## 2020-06-02 DIAGNOSIS — I4891 Unspecified atrial fibrillation: Secondary | ICD-10-CM | POA: Diagnosis not present

## 2020-06-02 DIAGNOSIS — E46 Unspecified protein-calorie malnutrition: Secondary | ICD-10-CM | POA: Diagnosis not present

## 2020-06-02 DIAGNOSIS — Z87891 Personal history of nicotine dependence: Secondary | ICD-10-CM | POA: Diagnosis not present

## 2020-06-02 DIAGNOSIS — M898X9 Other specified disorders of bone, unspecified site: Secondary | ICD-10-CM | POA: Diagnosis present

## 2020-06-02 DIAGNOSIS — Z882 Allergy status to sulfonamides status: Secondary | ICD-10-CM

## 2020-06-02 DIAGNOSIS — I700 Atherosclerosis of aorta: Secondary | ICD-10-CM | POA: Diagnosis not present

## 2020-06-02 DIAGNOSIS — I482 Chronic atrial fibrillation, unspecified: Secondary | ICD-10-CM

## 2020-06-02 DIAGNOSIS — R7309 Other abnormal glucose: Secondary | ICD-10-CM | POA: Diagnosis not present

## 2020-06-02 DIAGNOSIS — E1140 Type 2 diabetes mellitus with diabetic neuropathy, unspecified: Secondary | ICD-10-CM | POA: Diagnosis present

## 2020-06-02 DIAGNOSIS — I429 Cardiomyopathy, unspecified: Secondary | ICD-10-CM | POA: Diagnosis not present

## 2020-06-02 DIAGNOSIS — N184 Chronic kidney disease, stage 4 (severe): Secondary | ICD-10-CM | POA: Diagnosis not present

## 2020-06-02 DIAGNOSIS — E7800 Pure hypercholesterolemia, unspecified: Secondary | ICD-10-CM

## 2020-06-02 DIAGNOSIS — I5023 Acute on chronic systolic (congestive) heart failure: Secondary | ICD-10-CM | POA: Diagnosis present

## 2020-06-02 DIAGNOSIS — I517 Cardiomegaly: Secondary | ICD-10-CM | POA: Diagnosis not present

## 2020-06-02 DIAGNOSIS — I959 Hypotension, unspecified: Secondary | ICD-10-CM

## 2020-06-02 DIAGNOSIS — I214 Non-ST elevation (NSTEMI) myocardial infarction: Secondary | ICD-10-CM | POA: Diagnosis not present

## 2020-06-02 DIAGNOSIS — J9601 Acute respiratory failure with hypoxia: Secondary | ICD-10-CM | POA: Diagnosis not present

## 2020-06-02 DIAGNOSIS — J948 Other specified pleural conditions: Secondary | ICD-10-CM | POA: Diagnosis not present

## 2020-06-02 DIAGNOSIS — D509 Iron deficiency anemia, unspecified: Secondary | ICD-10-CM | POA: Diagnosis not present

## 2020-06-02 DIAGNOSIS — Z20822 Contact with and (suspected) exposure to covid-19: Secondary | ICD-10-CM | POA: Diagnosis not present

## 2020-06-02 DIAGNOSIS — E119 Type 2 diabetes mellitus without complications: Secondary | ICD-10-CM

## 2020-06-02 DIAGNOSIS — J90 Pleural effusion, not elsewhere classified: Secondary | ICD-10-CM

## 2020-06-02 DIAGNOSIS — Z79899 Other long term (current) drug therapy: Secondary | ICD-10-CM | POA: Diagnosis not present

## 2020-06-02 DIAGNOSIS — I2510 Atherosclerotic heart disease of native coronary artery without angina pectoris: Secondary | ICD-10-CM | POA: Diagnosis present

## 2020-06-02 DIAGNOSIS — I130 Hypertensive heart and chronic kidney disease with heart failure and stage 1 through stage 4 chronic kidney disease, or unspecified chronic kidney disease: Secondary | ICD-10-CM | POA: Diagnosis not present

## 2020-06-02 DIAGNOSIS — E1151 Type 2 diabetes mellitus with diabetic peripheral angiopathy without gangrene: Secondary | ICD-10-CM | POA: Diagnosis not present

## 2020-06-02 DIAGNOSIS — K5900 Constipation, unspecified: Secondary | ICD-10-CM | POA: Diagnosis not present

## 2020-06-02 DIAGNOSIS — F17210 Nicotine dependence, cigarettes, uncomplicated: Secondary | ICD-10-CM | POA: Diagnosis not present

## 2020-06-02 DIAGNOSIS — R54 Age-related physical debility: Secondary | ICD-10-CM | POA: Diagnosis present

## 2020-06-02 DIAGNOSIS — J9691 Respiratory failure, unspecified with hypoxia: Secondary | ICD-10-CM | POA: Diagnosis not present

## 2020-06-02 DIAGNOSIS — D638 Anemia in other chronic diseases classified elsewhere: Secondary | ICD-10-CM | POA: Diagnosis not present

## 2020-06-02 DIAGNOSIS — I509 Heart failure, unspecified: Secondary | ICD-10-CM | POA: Diagnosis not present

## 2020-06-02 DIAGNOSIS — Z888 Allergy status to other drugs, medicaments and biological substances status: Secondary | ICD-10-CM

## 2020-06-02 DIAGNOSIS — R112 Nausea with vomiting, unspecified: Secondary | ICD-10-CM | POA: Diagnosis not present

## 2020-06-02 DIAGNOSIS — N170 Acute kidney failure with tubular necrosis: Secondary | ICD-10-CM | POA: Diagnosis not present

## 2020-06-02 DIAGNOSIS — I462 Cardiac arrest due to underlying cardiac condition: Secondary | ICD-10-CM | POA: Diagnosis not present

## 2020-06-02 DIAGNOSIS — I5021 Acute systolic (congestive) heart failure: Secondary | ICD-10-CM

## 2020-06-02 DIAGNOSIS — I4821 Permanent atrial fibrillation: Secondary | ICD-10-CM | POA: Diagnosis not present

## 2020-06-02 DIAGNOSIS — E875 Hyperkalemia: Secondary | ICD-10-CM | POA: Diagnosis not present

## 2020-06-02 DIAGNOSIS — E559 Vitamin D deficiency, unspecified: Secondary | ICD-10-CM | POA: Diagnosis present

## 2020-06-02 DIAGNOSIS — I5043 Acute on chronic combined systolic (congestive) and diastolic (congestive) heart failure: Secondary | ICD-10-CM | POA: Diagnosis not present

## 2020-06-02 DIAGNOSIS — I340 Nonrheumatic mitral (valve) insufficiency: Secondary | ICD-10-CM | POA: Diagnosis present

## 2020-06-02 DIAGNOSIS — N179 Acute kidney failure, unspecified: Secondary | ICD-10-CM | POA: Diagnosis not present

## 2020-06-02 DIAGNOSIS — K219 Gastro-esophageal reflux disease without esophagitis: Secondary | ICD-10-CM | POA: Diagnosis not present

## 2020-06-02 DIAGNOSIS — H35033 Hypertensive retinopathy, bilateral: Secondary | ICD-10-CM | POA: Diagnosis not present

## 2020-06-02 DIAGNOSIS — R0600 Dyspnea, unspecified: Secondary | ICD-10-CM

## 2020-06-02 DIAGNOSIS — E871 Hypo-osmolality and hyponatremia: Secondary | ICD-10-CM | POA: Diagnosis not present

## 2020-06-02 DIAGNOSIS — I10 Essential (primary) hypertension: Secondary | ICD-10-CM | POA: Diagnosis not present

## 2020-06-02 DIAGNOSIS — E782 Mixed hyperlipidemia: Secondary | ICD-10-CM | POA: Diagnosis not present

## 2020-06-02 DIAGNOSIS — I420 Dilated cardiomyopathy: Secondary | ICD-10-CM

## 2020-06-02 DIAGNOSIS — E0590 Thyrotoxicosis, unspecified without thyrotoxic crisis or storm: Secondary | ICD-10-CM | POA: Diagnosis present

## 2020-06-02 DIAGNOSIS — E1142 Type 2 diabetes mellitus with diabetic polyneuropathy: Secondary | ICD-10-CM | POA: Diagnosis not present

## 2020-06-02 DIAGNOSIS — Z91040 Latex allergy status: Secondary | ICD-10-CM

## 2020-06-02 DIAGNOSIS — N183 Chronic kidney disease, stage 3 unspecified: Secondary | ICD-10-CM | POA: Diagnosis not present

## 2020-06-02 DIAGNOSIS — I1310 Hypertensive heart and chronic kidney disease without heart failure, with stage 1 through stage 4 chronic kidney disease, or unspecified chronic kidney disease: Secondary | ICD-10-CM

## 2020-06-02 DIAGNOSIS — J9811 Atelectasis: Secondary | ICD-10-CM | POA: Diagnosis not present

## 2020-06-02 DIAGNOSIS — Z8249 Family history of ischemic heart disease and other diseases of the circulatory system: Secondary | ICD-10-CM

## 2020-06-02 DIAGNOSIS — E1121 Type 2 diabetes mellitus with diabetic nephropathy: Secondary | ICD-10-CM | POA: Diagnosis not present

## 2020-06-02 DIAGNOSIS — K5901 Slow transit constipation: Secondary | ICD-10-CM | POA: Diagnosis not present

## 2020-06-02 DIAGNOSIS — Z7984 Long term (current) use of oral hypoglycemic drugs: Secondary | ICD-10-CM | POA: Diagnosis not present

## 2020-06-02 DIAGNOSIS — I428 Other cardiomyopathies: Secondary | ICD-10-CM | POA: Diagnosis not present

## 2020-06-02 DIAGNOSIS — K5190 Ulcerative colitis, unspecified, without complications: Secondary | ICD-10-CM | POA: Diagnosis not present

## 2020-06-02 DIAGNOSIS — E1165 Type 2 diabetes mellitus with hyperglycemia: Secondary | ICD-10-CM | POA: Diagnosis not present

## 2020-06-02 LAB — CBC WITH DIFFERENTIAL/PLATELET
Abs Immature Granulocytes: 0.03 10*3/uL (ref 0.00–0.07)
Basophils Absolute: 0 10*3/uL (ref 0.0–0.1)
Basophils Relative: 0 %
Eosinophils Absolute: 0 10*3/uL (ref 0.0–0.5)
Eosinophils Relative: 0 %
HCT: 31.7 % — ABNORMAL LOW (ref 36.0–46.0)
Hemoglobin: 9.7 g/dL — ABNORMAL LOW (ref 12.0–15.0)
Immature Granulocytes: 0 %
Lymphocytes Relative: 12 %
Lymphs Abs: 1.2 10*3/uL (ref 0.7–4.0)
MCH: 29.4 pg (ref 26.0–34.0)
MCHC: 30.6 g/dL (ref 30.0–36.0)
MCV: 96.1 fL (ref 80.0–100.0)
Monocytes Absolute: 0.9 10*3/uL (ref 0.1–1.0)
Monocytes Relative: 10 %
Neutro Abs: 7.4 10*3/uL (ref 1.7–7.7)
Neutrophils Relative %: 78 %
Platelets: 304 10*3/uL (ref 150–400)
RBC: 3.3 MIL/uL — ABNORMAL LOW (ref 3.87–5.11)
RDW: 14.5 % (ref 11.5–15.5)
WBC: 9.6 10*3/uL (ref 4.0–10.5)
nRBC: 0 % (ref 0.0–0.2)

## 2020-06-02 LAB — COMPREHENSIVE METABOLIC PANEL
ALT: 21 U/L (ref 0–44)
AST: 30 U/L (ref 15–41)
Albumin: 3.8 g/dL (ref 3.5–5.0)
Alkaline Phosphatase: 66 U/L (ref 38–126)
Anion gap: 12 (ref 5–15)
BUN: 64 mg/dL — ABNORMAL HIGH (ref 8–23)
CO2: 20 mmol/L — ABNORMAL LOW (ref 22–32)
Calcium: 9.1 mg/dL (ref 8.9–10.3)
Chloride: 105 mmol/L (ref 98–111)
Creatinine, Ser: 2.27 mg/dL — ABNORMAL HIGH (ref 0.44–1.00)
GFR, Estimated: 22 mL/min — ABNORMAL LOW (ref >60)
Glucose, Bld: 146 mg/dL — ABNORMAL HIGH (ref 70–99)
Potassium: 6.1 mmol/L — ABNORMAL HIGH (ref 3.5–5.1)
Sodium: 137 mmol/L (ref 135–145)
Total Bilirubin: 0.9 mg/dL (ref 0.3–1.2)
Total Protein: 6.6 g/dL (ref 6.5–8.1)

## 2020-06-02 LAB — GLUCOSE, CAPILLARY: Glucose-Capillary: 237 mg/dL — ABNORMAL HIGH (ref 70–99)

## 2020-06-02 LAB — HEMOGLOBIN A1C
Hgb A1c MFr Bld: 7.3 % — ABNORMAL HIGH (ref 4.8–5.6)
Mean Plasma Glucose: 162.81 mg/dL

## 2020-06-02 LAB — TSH: TSH: 0.154 u[IU]/mL — ABNORMAL LOW (ref 0.350–4.500)

## 2020-06-02 LAB — APTT: aPTT: 35 seconds (ref 24–36)

## 2020-06-02 LAB — MAGNESIUM: Magnesium: 2.1 mg/dL (ref 1.7–2.4)

## 2020-06-02 LAB — PROTIME-INR
INR: 2.8 — ABNORMAL HIGH (ref 0.8–1.2)
Prothrombin Time: 28.5 seconds — ABNORMAL HIGH (ref 11.4–15.2)

## 2020-06-02 LAB — SARS CORONAVIRUS 2 (TAT 6-24 HRS): SARS Coronavirus 2: NEGATIVE

## 2020-06-02 LAB — BRAIN NATRIURETIC PEPTIDE: B Natriuretic Peptide: 2338.9 pg/mL — ABNORMAL HIGH (ref 0.0–100.0)

## 2020-06-02 MED ORDER — SODIUM CHLORIDE 0.9 % IV SOLN
250.0000 mL | INTRAVENOUS | Status: DC | PRN
  Administered 2020-06-07 – 2020-06-11 (×3): 250 mL via INTRAVENOUS

## 2020-06-02 MED ORDER — SODIUM ZIRCONIUM CYCLOSILICATE 10 G PO PACK
10.0000 g | PACK | Freq: Once | ORAL | Status: AC
  Administered 2020-06-02: 22:00:00 10 g via ORAL
  Filled 2020-06-02: qty 1

## 2020-06-02 MED ORDER — SODIUM CHLORIDE 0.9% FLUSH
3.0000 mL | Freq: Two times a day (BID) | INTRAVENOUS | Status: DC
  Administered 2020-06-02 – 2020-06-15 (×17): 3 mL via INTRAVENOUS

## 2020-06-02 MED ORDER — INSULIN ASPART 100 UNIT/ML ~~LOC~~ SOLN
0.0000 [IU] | Freq: Three times a day (TID) | SUBCUTANEOUS | Status: DC
  Administered 2020-06-03 – 2020-06-04 (×3): 1 [IU] via SUBCUTANEOUS
  Administered 2020-06-04: 3 [IU] via SUBCUTANEOUS
  Administered 2020-06-05: 2 [IU] via SUBCUTANEOUS
  Administered 2020-06-06: 1 [IU] via SUBCUTANEOUS
  Administered 2020-06-06 – 2020-06-07 (×2): 2 [IU] via SUBCUTANEOUS
  Administered 2020-06-08: 07:00:00 1 [IU] via SUBCUTANEOUS
  Administered 2020-06-08 (×2): 2 [IU] via SUBCUTANEOUS
  Administered 2020-06-09: 3 [IU] via SUBCUTANEOUS
  Administered 2020-06-09: 12:00:00 2 [IU] via SUBCUTANEOUS
  Administered 2020-06-10: 3 [IU] via SUBCUTANEOUS
  Administered 2020-06-10: 1 [IU] via SUBCUTANEOUS
  Administered 2020-06-10: 2 [IU] via SUBCUTANEOUS
  Administered 2020-06-11: 1 [IU] via SUBCUTANEOUS
  Administered 2020-06-13: 5 [IU] via SUBCUTANEOUS
  Administered 2020-06-13: 1 [IU] via SUBCUTANEOUS
  Administered 2020-06-14: 2 [IU] via SUBCUTANEOUS
  Administered 2020-06-14: 5 [IU] via SUBCUTANEOUS
  Administered 2020-06-14 – 2020-06-15 (×3): 3 [IU] via SUBCUTANEOUS
  Administered 2020-06-15 – 2020-06-16 (×2): 2 [IU] via SUBCUTANEOUS

## 2020-06-02 MED ORDER — MESALAMINE 1.2 G PO TBEC
4.8000 g | DELAYED_RELEASE_TABLET | Freq: Every day | ORAL | Status: DC
  Administered 2020-06-03 – 2020-06-14 (×9): 4.8 g via ORAL
  Filled 2020-06-02 (×17): qty 4

## 2020-06-02 MED ORDER — GABAPENTIN 300 MG PO CAPS
300.0000 mg | ORAL_CAPSULE | Freq: Every day | ORAL | Status: DC
  Administered 2020-06-04: 300 mg via ORAL
  Filled 2020-06-02: qty 1

## 2020-06-02 MED ORDER — ATORVASTATIN CALCIUM 10 MG PO TABS
20.0000 mg | ORAL_TABLET | Freq: Every day | ORAL | Status: DC
  Administered 2020-06-03 – 2020-06-05 (×3): 20 mg via ORAL
  Filled 2020-06-02 (×3): qty 2

## 2020-06-02 MED ORDER — ONDANSETRON HCL 4 MG/2ML IJ SOLN
4.0000 mg | Freq: Four times a day (QID) | INTRAMUSCULAR | Status: DC | PRN
  Administered 2020-06-05 – 2020-06-12 (×9): 4 mg via INTRAVENOUS
  Filled 2020-06-02 (×9): qty 2

## 2020-06-02 MED ORDER — INSULIN ASPART 100 UNIT/ML ~~LOC~~ SOLN
0.0000 [IU] | Freq: Every day | SUBCUTANEOUS | Status: DC
  Administered 2020-06-02 – 2020-06-12 (×4): 2 [IU] via SUBCUTANEOUS

## 2020-06-02 MED ORDER — SODIUM CHLORIDE 0.9% FLUSH: 3.0000 mL | INTRAVENOUS | Status: DC | PRN

## 2020-06-02 MED ORDER — ACETAMINOPHEN 325 MG PO TABS: 650.0000 mg | ORAL_TABLET | ORAL | Status: DC | PRN

## 2020-06-02 MED ORDER — FUROSEMIDE 10 MG/ML IJ SOLN
80.0000 mg | Freq: Two times a day (BID) | INTRAMUSCULAR | Status: DC
  Administered 2020-06-02 – 2020-06-05 (×7): 80 mg via INTRAVENOUS
  Filled 2020-06-02 (×7): qty 8

## 2020-06-02 NOTE — Progress Notes (Signed)
Cardiology History and Physical:   Date:  06/02/2020   ID:  Cynthia Hansen, DOB Jun 29, 1940, MRN [id number]  PCP:  Aretta Nip, MD    Chief Complaint  Patient presents with  . Shortness of Breath    LE edema     Wt Readings from Last 3 Encounters:  06/02/20 186 lb 6.4 oz (84.6 kg)  05/13/20 197 lb 3.2 oz (89.4 kg)  05/04/20 188 lb (85.3 kg)      History of Present Illness: Cynthia Hansen is a 79 y.o. female  who has a history of atrial fibrillation on warfarin. She underwent cardioversion back in 2007. This lasted for about a year but she reverted back to atrial fibrillation. She has been rate controlled since that time. She also has hypertension, hyperlipidemia, DM, smoker(1/2 ppd), and PVD with 50-69% RICA and patent Left carotid endarterectomy 10/22/14 followed by Dr. Oneida Alar.  She had lost 50 pounds over a 2 year period and maintained the wieght loss. She had been exercising 3 days a week at [facility] and watching her diet. Dropped to 1-2 x/week.  She continued to smoke half a pack of cigarettes daily until 10/06/2015 when she quit. She restarted smoking with the stress of her husbands illness.   Shehad somechest discomfort in 3/18.She had anegative troponin andsx.resolved with belching.  Unfortunately her husband passed away fromlung cancer.  In 11/21, she saw Dr. Irish Lack and she reported: "she has been having ache in left arm". She reported  that the ache has been going on for several months. Improved some with tylenol and hemp cream and thought it may be neuropathy. She was not having any chest pain. She also was having back issues and receiving injections. Patient reported she did receive additional COVID 19 vaccine in August in left arm and thinks pain may have started shortly after that time. She also reported some fluid overload. Nuclear stress test showed no ischemia and reduced LVF and 2D echo showed EF 30-35%  with mild to moderate MR and mild PHTN.  Dr. Irish Lack sent a note to her nephrologist, Dr. Marval Regal, in regards to her diuretic regimen.    Patient called into the office 12/14 with 12lb weight gain from 04/22/2020 to 05/18/2020 (188>>200lb) with LE edema and SOB.  Unfortunately she missed her appt on 12/13 with Dr. Bing Ree as she got mixed up on the times and cannot get in until 06/07/2020.  Dr. Irish Lack increased her Lasix to 40mg  BID.  She presented to ER on 05/27/2020 due to hyperkalemia (5.9 despite stopping K+ suppl 2 weeks prior) with CKD stage 4 and bilateral LE edema with SOB and noted to have 4+ pitting edema of the RLE and 3+ in the LLE.  In ER O2 sats were 96% on RA, Hbg 9.8 and K+ 5.8 and SCr 2.35.  She was given [facility] and sent home on Lasix 40mg  TID for 2 days and then back to 40mg  BID.  Repeat BMET 12/27 showed SCr improved to 2.13 and K+ 4.8.  Her BNp on 12/23 was 2213 and proBNP 48K (46K on 12/9) on 12/27.   She is now here today for followup today.  She says that her RLE edema has completely resolved but she still has marked LLE edema.  She uses no table salt and has not eaten out in the pst 3-4 weeks.  She has not eaten any salty snacks, luncheon meat, bacon or sausage but does eat cheese but not much.  She did not notice much increase in frequency when diuretics was increased but was urinating more volume at each time. Her SOB has improved slightly  but is still present.   She is down 11 lbs from 02/09/2020.    Past Medical History:  Diagnosis Date  . A-fib ([place])   . Acute on chronic combined systolic (congestive) and diastolic (congestive) heart failure ([place])   . Carotid artery occlusion   . DCM (dilated cardiomyopathy) ([place])    EF 30-35% by echo 05/2020  . Diabetes mellitus   . Hyperlipidemia   . Irregular heart beat   . Vitamin D deficiency     Past Surgical History:  Procedure Laterality Date  . ABDOMINAL HERNIA REPAIR    . CAROTID ENDARTERECTOMY Left Dec. 3, 2008     cea  . CATARACT EXTRACTION Left   . HYSTEROSCOPY WITH D & C N/A 07/30/2014   Procedure: DILATATION AND CURETTAGE /HYSTEROSCOPY;  Surgeon: Osborne Oman, MD;  Location: [facility];  Service: Gynecology;  Laterality: N/A;  . IUD REMOVAL N/A 07/30/2014   Procedure: INTRAUTERINE DEVICE (IUD) REMOVAL;  Surgeon: Osborne Oman, MD;  Location: [facility];  Service: Gynecology;  Laterality: N/A;     Current Outpatient Medications  Medication Sig Dispense Refill  . acetaminophen (TYLENOL) 500 MG tablet Take 1,000 mg by mouth every 6 (six) hours as needed for mild pain (For back pain.).    Marland Kitchen atorvastatin (LIPITOR) 20 MG tablet TAKE 1 TABLET(20 MG) BY MOUTH DAILY 90 tablet 2  . colestipol (COLESTID) 1 g tablet Take 1 g by mouth 2 (two) times daily.    Marland Kitchen diltiazem (CARDIZEM CD) 180 MG 24 hr capsule TAKE 1 CAPSULE BY MOUTH TWICE DAILY BEFORE MEALS 180 capsule 3  . diphenoxylate-atropine (LOMOTIL) 2.5-0.025 MG tablet Take 1 tablet by mouth 2 (two) times daily as needed.    . fexofenadine (ALLEGRA) 180 MG tablet Take 180 mg by mouth daily.    Marland Kitchen FLUAD QUADRIVALENT 0.5 ML injection     . furosemide (LASIX) 40 MG tablet Take 1 tablet (40 mg total) by mouth 2 (two) times daily. 180 tablet 3  . gabapentin (NEURONTIN) 300 MG capsule Take 300 mg by mouth daily.    Marland Kitchen glimepiride (AMARYL) 2 MG tablet TK 1 T PO BID  1  . HYDROcodone-acetaminophen (NORCO/VICODIN) 5-325 MG tablet Take 1 tablet by mouth every 6 (six) hours as needed for moderate pain or severe pain. Reported on 11/18/2015    . lisinopril (PRINIVIL,ZESTRIL) 5 MG tablet Take 2.5 mg by mouth daily.    . mesalamine (LIALDA) 1.2 g EC tablet Take 4.8 g by mouth daily with breakfast.    . ONE TOUCH ULTRA TEST test strip 1 each by Other route as needed (for BG testing).   5  . pseudoephedrine-guaifenesin (MUCINEX D) 60-600 MG 12 hr tablet Take 1 tablet by mouth every 12 (twelve) hours.    . Vitamin D, Ergocalciferol, (DRISDOL) 1.25 MG (50000 UNIT) CAPS capsule Take  50,000 Units by mouth once a week.    . warfarin (COUMADIN) 1 MG tablet Take 1 tablet to 1 and 1/2 tablets by mouth daily as directed by the coumadin clinic. 40 tablet 0   No current facility-administered medications for this visit.    Allergies:   Avandia [rosiglitazone], Latex, and Sulfa antibiotics    Social History:  The patient  reports that she quit smoking about 3 years ago. Her smoking use included cigarettes. She quit after 50.00 years  of use. She has never used smokeless tobacco. She reports current alcohol use. She reports that she does not use drugs.   Family History:  The patient's family history includes Cancer in her mother; Heart attack in her father; Hypertension in her sister.    ROS:  Please see the history of present illness.   Otherwise, review of systems are positive for leg swelling.   All other systems are reviewed and negative.    PHYSICAL EXAM: VS:  BP (!) 102/54   Pulse (!) 53   Ht 5\' 9"  (1.753 m)   Wt 186 lb 6.4 oz (84.6 kg)   SpO2 96%   BMI 27.53 kg/m  , BMI Body mass index is 27.53 kg/m. GEN: Well nourished, well developed, in no acute distress  HEENT: normal  Neck: +JVD, no  Masses.  Right carotid artery bruit Cardiac: irregularly irregular; no murmurs, rubs, or gallops.  4+ pitting edema of her left foot Respiratory:  Decreased BS throughout GI: soft, nontender, nondistended, + BS MS: no deformity or atrophy  Skin: warm and dry, no rash Neuro:  Strength and sensation are intact Psych: euthymic mood, full affect   EKG:   The ekg ordered 12/23 showed atrial fibrillation with anterior infarct and PVCs    Recent Labs: 05/27/2020: B Natriuretic Peptide 2,213.1; Hemoglobin 9.8; Platelets 318 05/31/2020: BUN 60; Creatinine, Ser 2.13; NT-Pro BNP 48,555; Potassium 4.8; Sodium 137   Lipid Panel    Component Value Date/Time   CHOL 132 12/25/2017 0737   TRIG 141 12/25/2017 0737   HDL 38 (L) 12/25/2017 0737   CHOLHDL 3.5 12/25/2017 0737    CHOLHDL 2.7 02/23/2016 0749   VLDL 24 02/23/2016 0749   LDLCALC 66 12/25/2017 0737     Other studies Reviewed: Additional studies/ records that were reviewed today with results demonstrating: .  Nuclear stress test 05/04/2020 Study Highlights   Nuclear stress EF: 26%.  The left ventricular ejection fraction is severely decreased (<30%).  Defect 1: There is a medium defect of severe severity present in the basal inferior, mid inferior, apical inferior and apical lateral location.  Defect 2: There is a small defect of moderate severity present in the mid inferoseptal, apical septal and apex location.  Findings consistent with prior myocardial infarction.  This is a high risk study due to reduced systolic function.  There is no ischemia.  Global hypokinesis with inferior akinesis.   2D echo 05/14/2020 IMPRESSIONS   1. Left ventricular ejection fraction, by estimation, is 35 to 40%. The  left ventricle has moderately decreased function. Left ventricular  endocardial border not optimally defined to evaluate regional wall motion.  The left ventricular internal cavity  size was moderately dilated. Left ventricular diastolic function could not  be evaluated.  2. Right ventricular systolic function is mildly reduced. The right  ventricular size is normal. There is mildly elevated pulmonary artery  systolic pressure. The estimated right ventricular systolic pressure is  17.6 mmHg.  3. Left atrial size was moderately dilated.  4. Right atrial size was severely dilated.  5. The mitral valve is normal in structure. Mild to moderate mitral valve  regurgitation. No evidence of mitral stenosis. Moderate mitral annular  calcification.  6. Tricuspid valve regurgitation is moderate.  7. The aortic valve is normal in structure. Aortic valve regurgitation is  not visualized. No aortic stenosis is present.  8. The inferior vena cava is normal in size with greater than 50%   respiratory variability, suggesting right atrial pressure  of 3  mmHg.Consider limited study with definity contrast to better evsaluate for  wall motion abnormalities   ASSESSMENT AND PLAN:  1.  Acute systolic CHF -this is a new dx for her with echo 05/2020 showing EF 35-40% with moderate LVE -diastolic function could not be evaluated on echo due to atrial fibrillation -she has been having issues with weight gain, marked LE edema and SOB since her husband passed away from lung CA.  She had initially complained of left arm pain intermittent for several months and thought it was a neuroopathy but no CP.  She has a significant hx of tobacco use and quit in 2017 but then restarted after her husband passed away.  Diff Dx of DCM includes coronary ischemia,  Broken Heart syndrome from death of her husband.  She has chronic afib since at least 2014 and HR has been well controlled so unlikely to be tachy mediated.   -nuclear stress test 04/2020 was high risk with prior infarct and no ischemia with EF 35% with medium defect of severe severity present in the basal inferior, mid inferior, apical inferior and apical lateral location and small defect of moderate severity present in the mid inferoseptal, apical septal and apex location. Findings consistent with prior myocardial infarction. -she has had issues now with volume overload for over a month with SOB, LE edema and weight gain as high as 12lbs and diuretics have been attempted to be managed outpt but limited but CKD stage 4 and hyperkalemia -lasix increased to 40mg  BID on 12/13 and then again to 40mg  TID on 29/92 complicated by hyperkalemia Rxd with Lokelma -ProBNP was 46K on 12/9 and now 48K despite increased diuretic doses -denies any excess Na in her diet and has been compliant with her meds -She remains volume overloaded on exam today with JVD, decreased lung sounds at bases and marked 4+ LLE pitting edema was well as uptrending pro BNP despite  increased diuretic dosing -I do not think it is safe to continue to try to manage this outpt -Recommend admission to hospital on AHF service -needs right and [facility] to define coronary anatomy and assess filling pressures>>unfortunately need renal guidance prior to cath due to Stage 4 CKD -will check INR and hold coumadin for now>>start IV heparin when INR<2.5 to keep a bridge since she is in afib -her atrial fibrillation is chronic since 2014 at least and rate controlled and likely will not convert to NSR at this time given chronicity and moderate LAE on echo -no ACEI/ARB/ARNi or Arlyce Harman due to CKD stage 4 and hyperkalemia>>currently on low dose ACE I which I will stop due to recent hyperkalemia and worsening renal function -BP too soft for Hydralazine/Imdur or BB -stop Cardizem due to LV dysfunction -change PO Lasix to IV 40mg  BID -follow strict I&O's, daily weights and renal function  2.  Chronic atrial fibrillation -dx several years ago -her HR is well controlled on exam -on coumadin -will hold coumadin and check INR and start IV Heparin once INR < 2.5 -her INRs have been very erratic and as high as 7 recently>>may benefit from changing to Eliquis -stop Cardizem due to LV dysfunction -once HR > 55-60bpm can add low dose BB if BP allows  3.  AKI on CKD stage 4 -possible cardiorenal -SCr elevated as high as 2.98 on 12/9 and down to 2.13 with increased diuretics by complicated by hyperkalemia -recommend renal consult in am as patient would benefit from heart cath given known vascular carotid disease  and smoking hx>>she has high risk for underlying CAD as well  4.  HTN -BP is soft -stop Cardizem due to LV dysfunction -Stop ACE I due to recent hyperkalemia  5.  Carotid artery disease -60-79% left and 1-39% right carotid stenosis by dopplers 08/2019 -continue statin -repeat 08/2020  6.  DM type 2 -followed by PCP -last HbA1C was 8.1 -needs aggressive control of DM  7.  HLD -LDL goal <  70 with carotid disease -continue Atorvastatin 20mg  daily -check FLP and ALT in am  Disposition: admit to hospital for CHF exacerbation and AKI and hypotension  The patient is critically ill with multiple organ systems failure and requires high complexity decision making for assessment and support, frequent evaluation and titration of therapies, application of advanced monitoring technologies and extensive interpretation of multiple databases. Critical Care Time devoted to patient care services described in this note is 60 minutes with >50% of time spent in direct patient care.     Signed, Fransico Him, MD  06/02/2020 4:58 PM    [address], [place], [place]  [address] Phone: [phone]; Fax: [phone]

## 2020-06-02 NOTE — H&P (Signed)
HISTORY AND PHYSICAL 06/02/20  this is a copy of Dr. Theodosia Blender office note from 5:29 PM   Cardiology History and Physical:   Date:  06/02/2020   ID:  Cynthia Hansen, DOB 1940-10-01, MRN [id number]  PCP:  Aretta Nip, MD                  Chief Complaint  Patient presents with  . Shortness of Breath    LE edema        Wt Readings from Last 3 Encounters:  06/02/20 186 lb 6.4 oz (84.6 kg)  05/13/20 197 lb 3.2 oz (89.4 kg)  05/04/20 188 lb (85.3 kg)      History of Present Illness: Cynthia Hansen is a 79 y.o. female  who has a history of atrial fibrillation on warfarin. She underwent cardioversion back in 2007. This lasted for about a year but she reverted back to atrial fibrillation. She has been rate controlled since that time. She also has hypertension, hyperlipidemia, DM, smoker(1/2 ppd), and PVD with 50-69% RICA and patent Left carotid endarterectomy 10/22/14 followed by Dr. Oneida Alar.  She had lost 50 pounds over a 2 year period and maintained the wieght loss. She had been exercising 3 days a week at [facility] cardiac rehabilitation and watching her diet. Dropped to 1-2 x/week.  She continued to smoke half a pack of cigarettes daily until 10/06/2015 when she quit. She restarted smoking with the stress of her husbands illness.   Shehad somechest discomfort in 3/18.She had anegative troponin andsx.resolved with belching.  Unfortunately her husbandpassed away fromlung cancer.  In 11/21, she saw Dr. Irish Lack and she reported: "she has been having ache in left arm". She reported  that the ache has been going on for several months. Improved some with tylenol and hemp cream and thought it may be neuropathy. She was not having any chest pain. She also was having back issues and receiving injections. Patient reported she did receive additional COVID 19 vaccine in August in left arm and thinks pain may have started shortly after that time.  She also reported some fluid overload. Nuclear stress test showed no ischemia and reduced LVF and 2D echo showed EF 30-35% with mild to moderate MR and mild PHTN.  Dr. Irish Lack sent a note to her nephrologist, Dr. Marval Regal, in regards to her diuretic regimen.    Patient called into the office 12/14 with 12lb weight gain from 04/22/2020 to 05/18/2020 (188>>200lb) with LE edema and SOB.  Unfortunately she missed her appt on 12/13 with Dr. Bing Ree as she got mixed up on the times and cannot get in until 06/07/2020.  Dr. Irish Lack increased her Lasix to 40mg  BID.  She presented to ER on 05/27/2020 due to hyperkalemia (5.9 despite stopping K+ suppl 2 weeks prior) with CKD stage 4 and bilateral LE edema with SOB and noted to have 4+ pitting edema of the RLE and 3+ in the LLE.  In ER O2 sats were 96% on RA, Hbg 9.8 and K+ 5.8 and SCr 2.35.  She was given [facility] and sent home on Lasix 40mg  TID for 2 days and then back to 40mg  BID.  Repeat BMET 12/27 showed SCr improved to 2.13 and K+ 4.8.  Her BNp on 12/23 was 2213 and proBNP 48K (46K on 12/9) on 12/27.   She is now here today for followup today.  She says that her RLE edema has completely resolved but she still has marked LLE edema.  She uses no table salt and has not eaten out in the pst 3-4 weeks.  She has not eaten any salty snacks, luncheon meat, bacon or sausage but does eat cheese but not much.  She did not notice much increase in frequency when diuretics was increased but was urinating more volume at each time. Her SOB has improved slightly  but is still present.   She is down 11 lbs from 02/09/2020.        Past Medical History:  Diagnosis Date  . A-fib ([place])   . Acute on chronic combined systolic (congestive) and diastolic (congestive) heart failure ([place])   . Carotid artery occlusion   . DCM (dilated cardiomyopathy) ([place])    EF 30-35% by echo 05/2020  . Diabetes mellitus   . Hyperlipidemia   . Irregular heart beat   . Vitamin D  deficiency          Past Surgical History:  Procedure Laterality Date  . ABDOMINAL HERNIA REPAIR    . CAROTID ENDARTERECTOMY Left Dec. 3, 2008    cea  . CATARACT EXTRACTION Left   . HYSTEROSCOPY WITH D & C N/A 07/30/2014   Procedure: DILATATION AND CURETTAGE /HYSTEROSCOPY;  Surgeon: Osborne Oman, MD;  Location: [facility];  Service: Gynecology;  Laterality: N/A;  . IUD REMOVAL N/A 07/30/2014   Procedure: INTRAUTERINE DEVICE (IUD) REMOVAL;  Surgeon: Osborne Oman, MD;  Location: [facility];  Service: Gynecology;  Laterality: N/A;           Current Outpatient Medications  Medication Sig Dispense Refill  . acetaminophen (TYLENOL) 500 MG tablet Take 1,000 mg by mouth every 6 (six) hours as needed for mild pain (For back pain.).    Marland Kitchen atorvastatin (LIPITOR) 20 MG tablet TAKE 1 TABLET(20 MG) BY MOUTH DAILY 90 tablet 2  . colestipol (COLESTID) 1 g tablet Take 1 g by mouth 2 (two) times daily.    Marland Kitchen diltiazem (CARDIZEM CD) 180 MG 24 hr capsule TAKE 1 CAPSULE BY MOUTH TWICE DAILY BEFORE MEALS 180 capsule 3  . diphenoxylate-atropine (LOMOTIL) 2.5-0.025 MG tablet Take 1 tablet by mouth 2 (two) times daily as needed.    . fexofenadine (ALLEGRA) 180 MG tablet Take 180 mg by mouth daily.    Marland Kitchen FLUAD QUADRIVALENT 0.5 ML injection     . furosemide (LASIX) 40 MG tablet Take 1 tablet (40 mg total) by mouth 2 (two) times daily. 180 tablet 3  . gabapentin (NEURONTIN) 300 MG capsule Take 300 mg by mouth daily.    Marland Kitchen glimepiride (AMARYL) 2 MG tablet TK 1 T PO BID  1  . HYDROcodone-acetaminophen (NORCO/VICODIN) 5-325 MG tablet Take 1 tablet by mouth every 6 (six) hours as needed for moderate pain or severe pain. Reported on 11/18/2015    . lisinopril (PRINIVIL,ZESTRIL) 5 MG tablet Take 2.5 mg by mouth daily.    . mesalamine (LIALDA) 1.2 g EC tablet Take 4.8 g by mouth daily with breakfast.    . ONE TOUCH ULTRA TEST test strip 1 each by Other route as needed (for BG testing).   5   . pseudoephedrine-guaifenesin (MUCINEX D) 60-600 MG 12 hr tablet Take 1 tablet by mouth every 12 (twelve) hours.    . Vitamin D, Ergocalciferol, (DRISDOL) 1.25 MG (50000 UNIT) CAPS capsule Take 50,000 Units by mouth once a week.    . warfarin (COUMADIN) 1 MG tablet Take 1 tablet to 1 and 1/2 tablets by mouth daily as directed by the coumadin clinic. [place]  tablet 0   No current facility-administered medications for this visit.    Allergies:   Avandia [rosiglitazone], Latex, and Sulfa antibiotics    Social History:  The patient  reports that she quit smoking about 3 years ago. Her smoking use included cigarettes. She quit after 50.00 years of use. She has never used smokeless tobacco. She reports current alcohol use. She reports that she does not use drugs.   Family History:  The patient's family history includes Cancer in her mother; Heart attack in her father; Hypertension in her sister.    ROS:  Please see the history of present illness.   Otherwise, review of systems are positive for leg swelling.   All other systems are reviewed and negative.    PHYSICAL EXAM: VS:  BP (!) 102/54   Pulse (!) 53   Ht 5\' 9"  (1.753 m)   Wt 186 lb 6.4 oz (84.6 kg)   SpO2 96%   BMI 27.53 kg/m  , BMI Body mass index is 27.53 kg/m. GEN: Well nourished, well developed, in no acute distress  HEENT: normal  Neck: +JVD, no  Masses.  Right carotid artery bruit Cardiac: irregularly irregular; no murmurs, rubs, or gallops.  4+ pitting edema of her left foot Respiratory:  Decreased BS throughout GI: soft, nontender, nondistended, + BS MS: no deformity or atrophy  Skin: warm and dry, no rash Neuro:  Strength and sensation are intact Psych: euthymic mood, full affect   EKG:   The ekg ordered 12/23 showed atrial fibrillation with anterior infarct and PVCs    Recent Labs: 05/27/2020: B Natriuretic Peptide 2,213.1; Hemoglobin 9.8; Platelets 318 05/31/2020: BUN 60; Creatinine, Ser 2.13;  NT-Pro BNP 48,555; Potassium 4.8; Sodium 137   Lipid Panel Labs (Brief)          Component Value Date/Time   CHOL 132 12/25/2017 0737   TRIG 141 12/25/2017 0737   HDL 38 (L) 12/25/2017 0737   CHOLHDL 3.5 12/25/2017 0737   CHOLHDL 2.7 02/23/2016 0749   VLDL 24 02/23/2016 0749   LDLCALC 66 12/25/2017 0737       Other studies Reviewed: Additional studies/ records that were reviewed today with results demonstrating: .  Nuclear stress test 05/04/2020 Study Highlights   Nuclear stress EF: 26%.  The left ventricular ejection fraction is severely decreased (<30%).  Defect 1: There is a medium defect of severe severity present in the basal inferior, mid inferior, apical inferior and apical lateral location.  Defect 2: There is a small defect of moderate severity present in the mid inferoseptal, apical septal and apex location.  Findings consistent with prior myocardial infarction.  This is a high risk study due to reduced systolic function.  There is no ischemia.  Global hypokinesis with inferior akinesis.   2D echo 05/14/2020 IMPRESSIONS   1. Left ventricular ejection fraction, by estimation, is 35 to 40%. The  left ventricle has moderately decreased function. Left ventricular  endocardial border not optimally defined to evaluate regional wall motion.  The left ventricular internal cavity  size was moderately dilated. Left ventricular diastolic function could not  be evaluated.  2. Right ventricular systolic function is mildly reduced. The right  ventricular size is normal. There is mildly elevated pulmonary artery  systolic pressure. The estimated right ventricular systolic pressure is  99.8 mmHg.  3. Left atrial size was moderately dilated.  4. Right atrial size was severely dilated.  5. The mitral valve is normal in structure. Mild to moderate mitral valve  regurgitation. No evidence of mitral stenosis. Moderate mitral annular  calcification.   6. Tricuspid valve regurgitation is moderate.  7. The aortic valve is normal in structure. Aortic valve regurgitation is  not visualized. No aortic stenosis is present.  8. The inferior vena cava is normal in size with greater than 50%  respiratory variability, suggesting right atrial pressure of 3  mmHg.Consider limited study with definity contrast to better evsaluate for  wall motion abnormalities   ASSESSMENT AND PLAN:  1.  Acute systolic CHF -this is a new dx for her with echo 05/2020 showing EF 35-40% with moderate LVE -diastolic function could not be evaluated on echo due to atrial fibrillation -she has been having issues with weight gain, marked LE edema and SOB since her husband passed away from lung CA.  She had initially complained of left arm pain intermittent for several months and thought it was a neuroopathy but no CP.  She has a significant hx of tobacco use and quit in 2017 but then restarted after her husband passed away.  Diff Dx of DCM includes coronary ischemia,  Broken Heart syndrome from death of her husband.  She has chronic afib since at least 2014 and HR has been well controlled so unlikely to be tachy mediated.   -nuclear stress test 04/2020 was high risk with prior infarct and no ischemia with EF 35% with medium defect of severe severity present in the basal inferior, mid inferior, apical inferior and apical lateral location and small defect of moderate severity present in the mid inferoseptal, apical septal and apex location. Findings consistent with prior myocardial infarction. -she has had issues now with volume overload for over a month with SOB, LE edema and weight gain as high as 12lbs and diuretics have been attempted to be managed outpt but limited but CKD stage 4 and hyperkalemia -lasix increased to 40mg  BID on 12/13 and then again to 40mg  TID on 44/96 complicated by hyperkalemia Rxd with Lokelma -ProBNP was 46K on 12/9 and now 48K despite increased  diuretic doses -denies any excess Na in her diet and has been compliant with her meds -She remains volume overloaded on exam today with JVD, decreased lung sounds at bases and marked 4+ LLE pitting edema was well as uptrending pro BNP despite increased diuretic dosing -I do not think it is safe to continue to try to manage this outpt -Recommend admission to hospital on AHF service -needs right and [facility] to define coronary anatomy and assess filling pressures>>unfortunately need renal guidance prior to cath due to Stage 4 CKD -will check INR and hold coumadin for now>>start IV heparin when INR<2.5 to keep a bridge since she is in afib -her atrial fibrillation is chronic since 2014 at least and rate controlled and likely will not convert to NSR at this time given chronicity and moderate LAE on echo -no ACEI/ARB/ARNi or Arlyce Harman due to CKD stage 4 and hyperkalemia>>currently on low dose ACE I which I will stop due to recent hyperkalemia and worsening renal function -BP too soft for Hydralazine/Imdur or BB -stop Cardizem due to LV dysfunction -change PO Lasix to IV 40mg  BID -follow strict I&O's, daily weights and renal function  2.  Chronic atrial fibrillation -dx several years ago -her HR is well controlled on exam -on coumadin -will hold coumadin and check INR and start IV Heparin once INR < 2.5 -her INRs have been very erratic and as high as 7 recently>>may benefit from changing to Eliquis -stop Cardizem due  to LV dysfunction -once HR > 55-60bpm can add low dose BB if BP allows  3.  AKI on CKD stage 4 -possible cardiorenal -SCr elevated as high as 2.98 on 12/9 and down to 2.13 with increased diuretics by complicated by hyperkalemia -recommend renal consult in am as patient would benefit from heart cath given known vascular carotid disease and smoking hx>>she has high risk for underlying CAD as well  4.  HTN -BP is soft -stop Cardizem due to LV dysfunction -Stop ACE I due to recent  hyperkalemia  5.  Carotid artery disease -60-79% left and 1-39% right carotid stenosis by dopplers 08/2019 -continue statin -repeat 08/2020  6.  DM type 2 -followed by PCP -last HbA1C was 8.1 -needs aggressive control of DM  7.  HLD -LDL goal < 70 with carotid disease -continue Atorvastatin 20mg  daily -check FLP and ALT in am  Disposition: admit to hospital for CHF exacerbation and AKI and hypotension  The patient is critically ill with multiple organ systems failure and requires high complexity decision making for assessment and support, frequent evaluation and titration of therapies, application of advanced monitoring technologies and extensive interpretation of multiple databases. Critical Care Time devoted to patient care services described in this note is 60 minutes with >50% of time spent in direct patient care.     Signed, Fransico Him, MD  06/02/2020 4:58 PM    [address], [place], [place]  [address] Phone: [phone]; Fax: [phone]

## 2020-06-02 NOTE — Patient Instructions (Signed)
Medication Instructions:  Your physician recommends that you continue on your current medications as directed. Please refer to the Current Medication list given to you today.  *If you need a refill on your cardiac medications before your next appointment, please call your pharmacy*  Follow-Up: At [facility], you and your health needs are our priority.  As part of our continuing mission to provide you with exceptional heart care, we have created designated Provider Care Teams.  These Care Teams include your primary Cardiologist (physician) and Advanced Practice Providers (APPs -  Physician Assistants and Nurse Practitioners) who all work together to provide you with the care you need, when you need it.   Other Instructions You are being directly admitted to [facility]

## 2020-06-02 NOTE — Progress Notes (Signed)
ANTICOAGULATION CONSULT NOTE - Initial Consult  Pharmacy Consult for heparin Indication: atrial fibrillation  Allergies  Allergen Reactions  . Avandia [Rosiglitazone] Other (See Comments)    Causes muscle weakness.  . Latex Other (See Comments)  . Sulfa Antibiotics     Patient Measurements:   Heparin Dosing Weight: 84kg  Vital Signs: Temp: 97.9 F (36.6 C) (12/29 1726) Temp Source: Oral (12/29 1726) BP: 114/55 (12/29 1726) Pulse Rate: 67 (12/29 1726)  Labs: Recent Labs    05/31/20 0939  CREATININE 2.13*    Estimated Creatinine Clearance: 25.3 mL/min (A) (by C-G formula based on SCr of 2.13 mg/dL (H)).   Medical History: Past Medical History:  Diagnosis Date  . A-fib ([place])   . Acute on chronic combined systolic (congestive) and diastolic (congestive) heart failure ([place])   . Carotid artery occlusion   . DCM (dilated cardiomyopathy) ([place])    EF 30-35% by echo 05/2020  . Diabetes mellitus   . Hyperlipidemia   . Irregular heart beat   . Vitamin D deficiency      Assessment: 79 yo W on warfarin for afib PTA now holding warfarin for [facility]. Pharmacy consulted to start heparin when INR < 2.5. Admit INR 2.8.   Warfarin PTA dose: 1.5mg  Fridays, 1mg  all other days (Last seen 05/26/20 by ACC)  Goal of Therapy:  Heparin level 0.3-0.7 units/ml Monitor platelets by anticoagulation protocol: Yes   Plan:  Hold heparin for now Start heparin when INR < 2.5  Monitor daily INR, HL, CBC/plt Monitor for signs/symptoms of bleeding   Benetta Spar, PharmD, BCPS, BCCP Clinical Pharmacist  Please check AMION for all [place] phone numbers After 10:00 PM, call [place]

## 2020-06-03 DIAGNOSIS — N183 Chronic kidney disease, stage 3 unspecified: Secondary | ICD-10-CM | POA: Diagnosis not present

## 2020-06-03 DIAGNOSIS — I10 Essential (primary) hypertension: Secondary | ICD-10-CM | POA: Diagnosis not present

## 2020-06-03 DIAGNOSIS — K219 Gastro-esophageal reflux disease without esophagitis: Secondary | ICD-10-CM | POA: Diagnosis not present

## 2020-06-03 DIAGNOSIS — N184 Chronic kidney disease, stage 4 (severe): Secondary | ICD-10-CM | POA: Diagnosis not present

## 2020-06-03 DIAGNOSIS — I5021 Acute systolic (congestive) heart failure: Secondary | ICD-10-CM | POA: Diagnosis not present

## 2020-06-03 DIAGNOSIS — E782 Mixed hyperlipidemia: Secondary | ICD-10-CM | POA: Diagnosis not present

## 2020-06-03 DIAGNOSIS — E1121 Type 2 diabetes mellitus with diabetic nephropathy: Secondary | ICD-10-CM | POA: Diagnosis not present

## 2020-06-03 DIAGNOSIS — N189 Chronic kidney disease, unspecified: Secondary | ICD-10-CM | POA: Diagnosis not present

## 2020-06-03 DIAGNOSIS — E1165 Type 2 diabetes mellitus with hyperglycemia: Secondary | ICD-10-CM | POA: Diagnosis not present

## 2020-06-03 DIAGNOSIS — H35033 Hypertensive retinopathy, bilateral: Secondary | ICD-10-CM | POA: Diagnosis not present

## 2020-06-03 LAB — BASIC METABOLIC PANEL
Anion gap: 11 (ref 5–15)
Anion gap: 13 (ref 5–15)
BUN: 61 mg/dL — ABNORMAL HIGH (ref 8–23)
BUN: 58 mg/dL — ABNORMAL HIGH (ref 8–23)
CO2: 22 mmol/L (ref 22–32)
CO2: 19 mmol/L — ABNORMAL LOW (ref 22–32)
Calcium: 8.7 mg/dL — ABNORMAL LOW (ref 8.9–10.3)
Calcium: 9.1 mg/dL (ref 8.9–10.3)
Chloride: 105 mmol/L (ref 98–111)
Chloride: 108 mmol/L (ref 98–111)
Creatinine, Ser: 2.18 mg/dL — ABNORMAL HIGH (ref 0.44–1.00)
Creatinine, Ser: 2.15 mg/dL — ABNORMAL HIGH (ref 0.44–1.00)
GFR, Estimated: 23 mL/min — ABNORMAL LOW (ref >60)
GFR, Estimated: 23 mL/min — ABNORMAL LOW (ref >60)
Glucose, Bld: 140 mg/dL — ABNORMAL HIGH (ref 70–99)
Glucose, Bld: 143 mg/dL — ABNORMAL HIGH (ref 70–99)
Potassium: 4.2 mmol/L (ref 3.5–5.1)
Potassium: 4.2 mmol/L (ref 3.5–5.1)
Sodium: 138 mmol/L (ref 135–145)
Sodium: 140 mmol/L (ref 135–145)

## 2020-06-03 LAB — PROTIME-INR
INR: 3.1 — ABNORMAL HIGH (ref 0.8–1.2)
Prothrombin Time: 31.2 seconds — ABNORMAL HIGH (ref 11.4–15.2)

## 2020-06-03 LAB — GLUCOSE, CAPILLARY
Glucose-Capillary: 118 mg/dL — ABNORMAL HIGH (ref 70–99)
Glucose-Capillary: 126 mg/dL — ABNORMAL HIGH (ref 70–99)
Glucose-Capillary: 133 mg/dL — ABNORMAL HIGH (ref 70–99)
Glucose-Capillary: 133 mg/dL — ABNORMAL HIGH (ref 70–99)

## 2020-06-03 LAB — T4, FREE: Free T4: 1.22 ng/dL — ABNORMAL HIGH (ref 0.61–1.12)

## 2020-06-03 MED ORDER — COLESTIPOL HCL 1 G PO TABS
1.0000 g | ORAL_TABLET | Freq: Two times a day (BID) | ORAL | Status: DC
  Administered 2020-06-03 – 2020-06-16 (×26): 1 g via ORAL
  Filled 2020-06-03 (×30): qty 1

## 2020-06-03 MED ORDER — CARVEDILOL 3.125 MG PO TABS
3.1250 mg | ORAL_TABLET | Freq: Two times a day (BID) | ORAL | Status: DC
  Administered 2020-06-03 – 2020-06-09 (×9): 3.125 mg via ORAL
  Filled 2020-06-03 (×9): qty 1

## 2020-06-03 NOTE — Progress Notes (Signed)
ReDS Clip Diuretic Study Pt study # [id number]  Your patient is in the Blinded arm of the ReDS Clip Diuretic study.  Your patient has had a ReDS reading and the reading has been transmitted to the cloud.   Thank You   The research team   Kerby Nora, PharmD, BCPS Heart Failure Stewardship Pharmacist Phone [phone]  Please check [email or web address] for unit-specific pharmacist phone numbers

## 2020-06-03 NOTE — Plan of Care (Signed)
Nutrition Education Note  RD consulted for nutrition education regarding  CHF. Pt also with AKI/CKD 4 with hyperkalemia and DM. These dietary considerations were also addressed.   RD provided "Heart Failure Nutrition Therapy" handout from the Academy of Nutrition and Dietetics. Also provided, Sodium and CKD, Potassium and CKD and Carb Counting with CKD handouts from [employer]. Reviewed patient's dietary recall. Pt does not use salt shaker or salt substitutes, like NoSalt, that are high in potassium. Pt sometimes only eats 2 meals per day; discussed importance of eating more consistently through out the day to assist with blood sugar control. Pt does not cook currently as she lives alone and has significant back pain and cannot stand for long periods. Pt reports for breakfast she might eat bagel with cream cheese, cereal, yogurt or a nutrigain bar. Lunch is usually a sandwich and dinner varies. Pt does like to eat Velveeta, chipped beef, Spam and vienna sausages. Provided examples on ways to decrease sodium intake in diet. Discouraged intake of high sodium foods and encouraged patient to read food labels and choose foods that are either "low in sodium" or have "no added salt." Encouraged pt to compare similar food items and choose the one lowest in salt. Discussed simple substitutions like choosing a low sodium cheese, like swiss, in place of her american cheese/velveeta on sandwiches or holding the cheese all together. Encouraged fresh fruits and vegetables as well as whole grain sources of carbohydrates to maximize fiber intake.   Given hyperkalemia on admission, despite being on lasix without potassium supplementation for several weeks, with CKD IV at baseline, reviewed high potassium food sources and encouraged pt to limit these at this time. Pt has appointment to see nephrologist next week; encouraged pt to discussed potassium with nephrologist and follow trends and adjust diet as  needed  RD discussed why it is important for patient to adhere to diet recommendations, and emphasized the role of fluids, foods to avoid, and importance of weighing self daily. Teach back method used.  Expect good compliance. Pt very receptive, asked appropriate questions and has good family support.  Body mass index is 27.13 kg/m.  Pt  reports no recent wt loss; weight gain from fluid.   Current diet order is Renal/Carb modified, pt with fair appetite his time. Consider liberalizing Renal diet if potassium remains normal as Renal diet is designed for ESRD patients. Labs and medications reviewed. No further nutrition interventions warranted at this time. RD contact information provided. If additional nutrition issues arise, please re-consult RD.   Kerman Passey MS, RDN, LDN, CNSC Registered Dietitian III Clinical Nutrition RD Pager and On-Call Pager Number Located in [place]

## 2020-06-03 NOTE — Progress Notes (Addendum)
[place] for heparin/warfarin Indication: atrial fibrillation  Allergies  Allergen Reactions  . Avandia [Rosiglitazone] Other (See Comments)    Causes muscle weakness.  . Latex Rash  . Sulfa Antibiotics Rash    Patient Measurements: Weight: 83.3 kg (183 lb 11.2 oz) Heparin Dosing Weight: 84kg  Vital Signs: Temp: 97.5 F (36.4 C) (12/30 1140) Temp Source: Oral (12/30 1140) BP: 115/61 (12/30 1140) Pulse Rate: 81 (12/30 1140)  Labs: Recent Labs    06/02/20 1832 06/02/20 2032 06/03/20 0247  HGB 9.7*  --   --   HCT 31.7*  --   --   PLT 304  --   --   APTT  --  35  --   LABPROT  --  28.5* 31.2*  INR  --  2.8* 3.1*  CREATININE 2.27*  --  2.18*    Estimated Creatinine Clearance: 24.5 mL/min (A) (by C-G formula based on SCr of 2.18 mg/dL (H)).   Medical History: Past Medical History:  Diagnosis Date  . A-fib ([place])   . Acute on chronic combined systolic (congestive) and diastolic (congestive) heart failure ([place])   . Carotid artery occlusion   . DCM (dilated cardiomyopathy) ([place])    EF 30-35% by echo 05/2020  . Diabetes mellitus   . Hyperlipidemia   . Irregular heart beat   . Vitamin D deficiency      Assessment: 79 yo W on warfarin for afib PTA now holding warfarin for [facility]. Pharmacy consulted to start heparin when INR < 2.5. Admit INR 2.8.   Warfarin PTA dose: 1.5mg  Fridays, 1mg  all other days (Last seen 05/26/20 by [facility]).  INR 3.1 - Hgb 9.7, plt 304. No s/sx of bleeding.   Goal of Therapy:  Heparin level 0.3-0.7 units/ml Monitor platelets by anticoagulation protocol: Yes   Plan:  Hold heparin and warfarin for now Start heparin when INR < 2 - will check with AM labs Monitor daily INR, HL, CBC/plt Monitor for signs/symptoms of bleeding   Antonietta Jewel, PharmD, [place] Clinical Pharmacist  Phone: [phone] 06/03/2020 2:55 PM  Please check AMION for all [place] phone numbers After 10:00 PM, call [place]  [phone]

## 2020-06-03 NOTE — Consult Note (Addendum)
  Advanced Heart Failure Team Consult Note   Primary Physician: Rankins, Victoria R, MD PCP-Cardiologist:  Jayadeep Varanasi, MD  Reason for Consultation: Heart Failure   HPI:    Cynthia Hansen is seen today for evaluation of heart failure at the request of Dr Turner.   Cynthia Hansen is a 79 year old with a history of a fib , DM, hyperlipidemia, PAD, smoker, ulcerative coloitis, and recently diagnosed systolic heart failure.   Over the last few months she has been struggling volume overload despite increased home diuretic regimen. This has been complicated by AKI/hyperkalemia.   Patient called into the office 12/14 with 12lb weight gain from 04/22/2020 to 05/18/2020 (188>>200lb) with LE edema and SOB.  Unfortunately she missed her appt on 12/13 with Dr. Coladnato as she got mixed up on the times and cannot get in until 06/07/2020.  Dr. Varanasi increased her Lasix to 40mg BID.  She presented to ER on 05/27/2020 due to hyperkalemia (5.9 despite stopping K+ suppl 2 weeks prior) with CKD stage 4 and bilateral LE edema with SOB and noted to have 4+ pitting edema of the RLE and 3+ in the LLE.  In ER O2 sats were 96% on RA, Hbg 9.8 and K+ 5.8 and SCr 2.35.  She was given Lokelma and sent home on Lasix 40mg TID for 2 days and then back to 40mg BID.    Functional decline over the last month. Prior to this she was active and independent. Unable to walk without getting short of breath. Not taking NSAIDS or high potassium foods. Has not had chest pain. Fall asleep easily. Never had sleep study. History of snoring.   Yesterday she was seen by Dr Turner in office and due marked volume overload she was sent for hospital admit. Admitted for [facility] office with volume overload. Started on IV lasix.  HF team consulted. BNP >2000, SAR negative, TSH 0.154, and hgb A1C 7.3.     Lab Results  Component Value Date   CREATININE 2.18 (H) 06/03/2020   CREATININE 2.27 (H) 06/02/2020   CREATININE 2.13 (H) 05/31/2020    Echo 05/2020 EF 35-40%   Nuclear Stress Test 04/2020   high risk with prior infarct and no ischemia with EF 35% withmedium defect of severe severity present in the basal inferior, mid inferior, apical inferior and apical lateral locationandsmall defect of moderate severity present in the mid inferoseptal, apical septal and apex location. Findings consistent with prior myocardial infarction.  Review of Systems: [y] = yes, [ ] = no   . General: Weight gain [Y ]; Weight loss [ ]; Anorexia [ ]; Fatigue [Y ]; Fever [ ]; Chills [ ]; Weakness [Y ]  . Cardiac: Chest pain/pressure [ ]; Resting SOB [ ]; Exertional SOB [ Y]; Orthopnea [ ]; Pedal Edema [ ]; Palpitations [ ]; Syncope [ ]; Presyncope [ ]; Paroxysmal nocturnal dyspnea[ ]  . Pulmonary: Cough [ ]; Wheezing[ ]; Hemoptysis[ ]; Sputum [ ]; Snoring [ ]  . GI: Vomiting[ ]; Dysphagia[ ]; Melena[ ]; Hematochezia [ ]; Heartburn[ ]; Abdominal pain [ ]; Constipation [ ]; Diarrhea [ ]; BRBPR [ ]  . GU: Hematuria[ ]; Dysuria [ ]; Nocturia[ ]  . Vascular: Pain in legs with walking [ ]; Pain in feet with lying flat [ ]; Non-healing sores [ ]; Stroke [ ]; TIA [ ]; Slurred speech [ ];  . Neuro: Headaches[ ]; Vertigo[ ]; Seizures[ ]; Paresthesias[ ];Blurred vision [ ]; Diplopia [ ];   Vision changes [ ]  . Ortho/Skin: Arthritis [ ]; Joint pain [Y]; Muscle pain [ ]; Joint swelling [ ]; Back Pain [ Y]; Rash [ ]  . Psych: Depression[ ]; Anxiety[ ]  . Heme: Bleeding problems [ ]; Clotting disorders [ ]; Anemia [ ]  . Endocrine: Diabetes [ Y]; Thyroid dysfunction[ ]  Home Medications Prior to Admission medications   Medication Sig Start Date End Date Taking? Authorizing Provider  acetaminophen (TYLENOL) 500 MG tablet Take 1,000 mg by mouth every 6 (six) hours as needed for mild pain (For back pain.).   Yes [provider]  atorvastatin (LIPITOR) 20 MG tablet TAKE 1 TABLET(20 MG) BY MOUTH DAILY Patient taking differently: Take 20 mg by mouth daily.  01/27/20  Yes Jettie Booze, MD  colestipol (COLESTID) 1 g tablet Take 1 g by mouth 2 (two) times daily.   Yes [provider]  diltiazem (CARDIZEM CD) 180 MG 24 hr capsule TAKE 1 CAPSULE BY MOUTH TWICE DAILY BEFORE MEALS Patient taking differently: Take 180 mg by mouth in the morning and at bedtime. 02/10/20  Yes Jettie Booze, MD  diphenoxylate-atropine (LOMOTIL) 2.5-0.025 MG tablet Take 1 tablet by mouth 2 (two) times daily as needed. 12/18/19  Yes [provider]  furosemide (LASIX) 40 MG tablet Take 1 tablet (40 mg total) by mouth 2 (two) times daily. 05/19/20  Yes Jettie Booze, MD  glimepiride (AMARYL) 2 MG tablet Take 2 mg by mouth daily with breakfast. 03/25/18  Yes [provider]  Liniments (SALONPAS ARTHRITIS PAIN RELIEF EX) Apply 1 application topically daily as needed (For lower back pain).   Yes [provider]  lisinopril (PRINIVIL,ZESTRIL) 5 MG tablet Take 2.5 mg by mouth daily. 07/13/12  Yes [provider]  Vitamin D, Ergocalciferol, (DRISDOL) 1.25 MG (50000 UNIT) CAPS capsule Take 50,000 Units by mouth once a week. 07/31/19  Yes [provider]  warfarin (COUMADIN) 1 MG tablet Take 1 tablet to 1 and 1/2 tablets by mouth daily as directed by the coumadin clinic. Patient taking differently: Take 1-1.5 mg by mouth See admin instructions. Take 1 tablet by mouth every day except on Tuesdays take  1 and 1/2 tablets by mouth daily as directed by the coumadin clinic. 05/26/20  Yes Jettie Booze, MD  ONE TOUCH ULTRA TEST test strip 1 each by Other route as needed (for BG testing).  03/22/14   [provider]    Past Medical History: Past Medical History:  Diagnosis Date  . A-fib ([place])   . Acute on chronic combined systolic (congestive) and diastolic (congestive) heart failure ([place])   . Carotid artery occlusion   . DCM (dilated cardiomyopathy) ([place])    EF 30-35% by echo 05/2020  . Diabetes mellitus    . Hyperlipidemia   . Irregular heart beat   . Vitamin D deficiency     Past Surgical History: Past Surgical History:  Procedure Laterality Date  . ABDOMINAL HERNIA REPAIR    . CAROTID ENDARTERECTOMY Left Dec. 3, 2008    cea  . CATARACT EXTRACTION Left   . HYSTEROSCOPY WITH D & C N/A 07/30/2014   Procedure: DILATATION AND CURETTAGE /HYSTEROSCOPY;  Surgeon: Osborne Oman, MD;  Location: [facility];  Service: Gynecology;  Laterality: N/A;  . IUD REMOVAL N/A 07/30/2014   Procedure: INTRAUTERINE DEVICE (IUD) REMOVAL;  Surgeon: Osborne Oman, MD;  Location: [facility];  Service: Gynecology;  Laterality: N/A;    Family History: Family History  Problem Relation Age of Onset  . Cancer Mother        Uterin  . Heart attack Father   . Hypertension Sister     Social History: Social History   Socioeconomic History  . Marital status: Married    Spouse name: Not on file  . Number of children: Not on file  . Years of education: Not on file  . Highest education level: Not on file  Occupational History  . Not on file  Tobacco Use  . Smoking status: Former Smoker    Years: 50.00    Types: Cigarettes    Quit date: 10/05/2016    Years since quitting: 3.6  . Smokeless tobacco: Never Used  Vaping Use  . Vaping Use: Never used  Substance and Sexual Activity  . Alcohol use: Yes    Alcohol/week: 0.0 standard drinks  . Drug use: No  . Sexual activity: Not Currently  Other Topics Concern  . Not on file  Social History Narrative  . Not on file   Social Determinants of Health   Financial Resource Strain: Not on file  Food Insecurity: Not on file  Transportation Needs: Not on file  Physical Activity: Not on file  Stress: Not on file  Social Connections: Not on file    Allergies:  Allergies  Allergen Reactions  . Avandia [Rosiglitazone] Other (See Comments)    Causes muscle weakness.  . Latex Rash  . Sulfa Antibiotics Rash    Objective:    Vital Signs:   Temp:  [97.9 F  (36.6 C)-98.1 F (36.7 C)] 98.1 F (36.7 C) (12/30 0841) Pulse Rate:  [65-68] 66 (12/30 0839) Resp:  [16-18] 18 (12/30 0839) BP: (110-139)/(55-69) 115/55 (12/30 0839) SpO2:  [98 %-100 %] 100 % (12/30 0839) Weight:  [83.3 kg-84.2 kg] 83.3 kg (12/30 0016) Last BM Date: 06/01/20  Weight change: Filed Weights   06/02/20 1744 06/03/20 0016  Weight: 84.2 kg 83.3 kg    Intake/Output:   Intake/Output Summary (Last 24 hours) at 06/03/2020 1108 Last data filed at 06/03/2020 0842 Gross per 24 hour  Intake 120 ml  Output 1200 ml  Net -1080 ml      Physical Exam    General:  No resp difficulty HEENT: normal Neck: supple. JVP 9-10 . Carotids 2+ bilat; no bruits. No lymphadenopathy or thyromegaly appreciated. Cor: PMI nondisplaced. Irregular rate & rhythm. No rubs, gallops or murmurs. Lungs: clear Abdomen: soft, nontender, nondistended. No hepatosplenomegaly. No bruits or masses. Good bowel sounds. Extremities: no cyanosis, clubbing, rash, LLLE 1+ edema RLE trace edema.  Neuro: alert & orientedx3, cranial nerves grossly intact. moves all 4 extremities w/o difficulty. Affect pleasant   Telemetry   A fib 80s   EKG     A Fib   Labs   Basic Metabolic Panel: Recent Labs  Lab 05/31/20 0939 06/02/20 1832 06/03/20 0247  NA 137 137 138  K 4.8 6.1* 4.2  CL 100 105 105  CO2 17* 20* 22  GLUCOSE 115* 146* 140*  BUN 60* 64* 61*  CREATININE 2.13* 2.27* 2.18*  CALCIUM 9.3 9.1 8.7*  MG  --  2.1  --     Liver Function Tests: Recent Labs  Lab 06/02/20 1832  AST 30  ALT 21  ALKPHOS 66  BILITOT 0.9  PROT 6.6  ALBUMIN 3.8   No results for input(s): LIPASE, AMYLASE in the last 168 hours. No results for input(s): AMMONIA in the last 168 hours.  CBC: Recent Labs    Lab 06/02/20 1832  WBC 9.6  NEUTROABS 7.4  HGB 9.7*  HCT 31.7*  MCV 96.1  PLT 304    Cardiac Enzymes: No results for input(s): CKTOTAL, CKMB, CKMBINDEX, TROPONINI in the last 168 hours.  BNP: BNP  (last 3 results) Recent Labs    05/27/20 0828 06/02/20 1832  BNP 2,213.1* 2,338.9*    ProBNP (last 3 results) Recent Labs    05/13/20 1550 05/31/20 0939  PROBNP 46,357* 48,555*     CBG: Recent Labs  Lab 06/02/20 2207 06/03/20 0611  GLUCAP 237* 118*    Coagulation Studies: Recent Labs    06/02/20 2032 06/03/20 0247  LABPROT 28.5* 31.2*  INR 2.8* 3.1*     Imaging   DG Chest 2 View  Result Date: 06/02/2020 CLINICAL DATA:  Shortness of breath. EXAM: CHEST - 2 VIEW COMPARISON:  May 13, 2020. FINDINGS: Stable cardiomegaly. No pneumothorax is noted. Mild to moderate right pleural effusion is noted with associated right basilar atelectasis or infiltrate. Minimal left basilar subsegmental atelectasis is noted. Bony thorax is unremarkable. IMPRESSION: Mild to moderate right pleural effusion is noted with associated right basilar atelectasis or infiltrate. Aortic Atherosclerosis (ICD10-I70.0). Electronically Signed   By: James  Green Jr M.D.   On: 06/02/2020 19:24      Medications:     Current Medications: . atorvastatin  20 mg Oral Daily  . furosemide  80 mg Intravenous BID  . gabapentin  300 mg Oral Daily  . insulin aspart  0-5 Units Subcutaneous QHS  . insulin aspart  0-9 Units Subcutaneous TID WC  . mesalamine  4.8 g Oral Q breakfast  . sodium chloride flush  3 mL Intravenous Q12H     Infusions: . sodium chloride           Assessment/Plan   1. A/C Systolic Heart Failure -ECHO 05/14/20 EF 35-40% RV mildly down.  -Had stress test 2021-high risk possible prior MI. Has cath with elevated creatinine.  - Started on IV lasix . Volume status improved but remains overloaded. Continue IV lasix.  - No bb - No Arb/Spir with hyperkalemia.   2. Chronic A fib , since 2014 - Rate controlled. Off coumadin for possible cath. INR  - Has been on diltiazem- off now with low EF.   - Anticipate adding coreg.   3. CKD Stage IV Baseline creatinine  1.8-2 Creatinine on admit 2.2   4. Hyperkalemia On admit 6.1. Given lokelma-->4,2 today  Dietitian met with her to discuss diet restrictions.   5.Carotid Disease 60-79% left and 1-39% right carotid stenosis by dopplers 08/2019 -continue statin. Check Lipid panel  -repeat 08/2020  6. DMII Hgb A1C 7.3.  On ssi   7.HLD On statin Check LDL   8. Daytime Fatigue/ Snores Needs outpatient sleep study  9. TSH low  Check free t4  Not having chest pain. Ideally needs cath but creatinine> 2.   Length of Stay: 1  Amy Clegg, NP  06/03/2020, 11:08 AM  Advanced Heart Failure Team Pager [phone] (M-F; 7a - 4p)  Please contact [facility] for night-coverage after hours (4p -7a ) and weekends on [email or web address]  Patient seen with NP, agree with the above note.   Patient was admitted with hyperkalemia and volume overload.  She has been struggling with CHF as well as worsening renal failure for a number of weeks now.    Cardiolite in 11/21 with EF 26%, inferior fixed perfusion defect with no ischemia.  Echo in 12/21 with EF 35-40%, moderate   LV dilation, mildly decreased RV function.   Yesterday, seen in [facility] cardiology clinic.  K was up to 6.4 and she was short of breath so was directly admitted.  She was given Lokelma and IV Lasix.  Today, K 4.2 with creatinine down to 2.18.    General: NAD Neck: JVP 14 cm, no thyromegaly or thyroid nodule.  Lungs: Clear to auscultation bilaterally with normal respiratory effort. CV: Nondisplaced PMI.  Heart irregular S1/S2, no S3/S4, no murmur.  1+ edema to knee on left, trace on right.  No carotid bruit.  Normal pedal pulses.  Abdomen: Soft, nontender, no hepatosplenomegaly, no distention.  Skin: Intact without lesions or rashes.  Neurologic: Alert and oriented x 3.  Psych: Normal affect. Extremities: No clubbing or cyanosis.  HEENT: Normal.   1. Acute on chronic systolic CHF: Cardiolite in 11/21 with EF 26%, inferior fixed perfusion defect with no  ischemia.  Echo in 12/21 with EF 35-40%, moderate LV dilation, mildly decreased RV function. Cause of cardiomyopathy uncertain but there is concern for ischemia cardiomyopathy.  Worsening CHF recently complicated by CKD stage IV. On exam today, she is volume overloaded.  She has had NYHA class III symptoms. Creatinine lower today at 2.18.  - Lasix 80 mg IV bid and follow response.  - Stop diltiazem and start Coreg 3.125 mg bid.  - Will be limited in terms of HF meds we can use with CKD 4.  For now, no ARNI/ARB/digoxin/spironolactone.  - Ideally, would have RHC/LHC to look for CAD as cause of cardiomyopathy after we have diuresed her.  However, will need improvement in creatinine for this.  If we do not cath her, would consider cardiac MRI.  2. CKD: Stage IV.  Creatinine 2.18 today.  Has had recent hyperkalemia, now improved.   - Follow carefully, hopefully will continue to improve with diruesis.  3. Chronic atrial fibrillation: This appears permanent, x years.  Has biatrial severe enlargement and unlikely to hold NSR with DCCV attempt.  - Continue rate control.  - INR 3.1 on warfarin, allow to trend down.  If we do cath, will start heparin gtt when INR < 2.  4. Low TSH: Concern for hyperthyroidism.  Will send free T3 and free T4.   Loralie Champagne 06/03/2020 3:05 PM

## 2020-06-04 DIAGNOSIS — I5021 Acute systolic (congestive) heart failure: Secondary | ICD-10-CM | POA: Diagnosis not present

## 2020-06-04 LAB — GLUCOSE, CAPILLARY
Glucose-Capillary: 90 mg/dL (ref 70–99)
Glucose-Capillary: 215 mg/dL — ABNORMAL HIGH (ref 70–99)
Glucose-Capillary: 129 mg/dL — ABNORMAL HIGH (ref 70–99)
Glucose-Capillary: 97 mg/dL (ref 70–99)

## 2020-06-04 LAB — BASIC METABOLIC PANEL
Anion gap: 10 (ref 5–15)
BUN: 56 mg/dL — ABNORMAL HIGH (ref 8–23)
CO2: 23 mmol/L (ref 22–32)
Calcium: 8.8 mg/dL — ABNORMAL LOW (ref 8.9–10.3)
Chloride: 105 mmol/L (ref 98–111)
Creatinine, Ser: 2.03 mg/dL — ABNORMAL HIGH (ref 0.44–1.00)
GFR, Estimated: 25 mL/min — ABNORMAL LOW (ref >60)
Glucose, Bld: 105 mg/dL — ABNORMAL HIGH (ref 70–99)
Potassium: 3.8 mmol/L (ref 3.5–5.1)
Sodium: 138 mmol/L (ref 135–145)

## 2020-06-04 LAB — LIPID PANEL
Cholesterol: 88 mg/dL (ref 0–200)
HDL: 47 mg/dL (ref >40)
LDL Cholesterol: 28 mg/dL (ref 0–99)
Total CHOL/HDL Ratio: 1.9 RATIO
Triglycerides: 67 mg/dL (ref <150)
VLDL: 13 mg/dL (ref 0–40)

## 2020-06-04 LAB — PROTIME-INR
INR: 2.2 — ABNORMAL HIGH (ref 0.8–1.2)
Prothrombin Time: 23.7 seconds — ABNORMAL HIGH (ref 11.4–15.2)

## 2020-06-04 MED ORDER — DAPAGLIFLOZIN PROPANEDIOL 10 MG PO TABS
10.0000 mg | ORAL_TABLET | Freq: Every day | ORAL | Status: DC
  Administered 2020-06-04 – 2020-06-09 (×6): 10 mg via ORAL
  Filled 2020-06-04 (×8): qty 1

## 2020-06-04 MED ORDER — POTASSIUM CHLORIDE CRYS ER 20 MEQ PO TBCR
20.0000 meq | EXTENDED_RELEASE_TABLET | Freq: Once | ORAL | Status: AC
  Administered 2020-06-04: 20 meq via ORAL
  Filled 2020-06-04: qty 1

## 2020-06-04 NOTE — Progress Notes (Signed)
Patient ID: Cynthia Hansen, female   DOB: Oct 29, 1940, 79 y.o.   MRN: [id number]     Advanced Heart Failure Rounding Note  PCP-Cardiologist: Larae Grooms, MD   Subjective:    Breathing is improving.  Weight down 5 lbs with diuresis, I/Os net negative -3420.  Creatinine lower at 2.03.   Of note, TSH is low and free T4 is high.    Objective:   Weight Range: 80.8 kg Body mass index is 26.32 kg/m.   Vital Signs:   Temp:  [97.5 F (36.4 C)-98.5 F (36.9 C)] 97.8 F (36.6 C) (12/31 0441) Pulse Rate:  [69-81] 69 (12/31 0441) Resp:  [16] 16 (12/30 1934) BP: (92-121)/(51-72) 92/51 (12/31 0441) SpO2:  [98 %-100 %] 98 % (12/31 0441) Weight:  [80.8 kg] 80.8 kg (12/31 0441) Last BM Date: 06/01/20  Weight change: Filed Weights   06/03/20 0016 06/03/20 0500 06/04/20 0441  Weight: 83.3 kg 83.3 kg 80.8 kg    Intake/Output:   Intake/Output Summary (Last 24 hours) at 06/04/2020 1029 Last data filed at 06/04/2020 1022 Gross per 24 hour  Intake 543 ml  Output 3350 ml  Net -2807 ml      Physical Exam    General:  Well appearing. No resp difficulty HEENT: Normal Neck: Supple. JVP 14 cm. Carotids 2+ bilat; no bruits. No lymphadenopathy or thyromegaly appreciated. Cor: PMI nondisplaced. Regular rate & rhythm. No rubs, gallops or murmurs. Lungs: Clear Abdomen: Soft, nontender, nondistended. No hepatosplenomegaly. No bruits or masses. Good bowel sounds. Extremities: No cyanosis, clubbing, rash. 1+ left ankle edema.  Neuro: Alert & orientedx3, cranial nerves grossly intact. moves all 4 extremities w/o difficulty. Affect pleasant   Telemetry   Atrial fibrillation 80s-90s (personally reviewed)   Labs    CBC Recent Labs    06/02/20 1832  WBC 9.6  NEUTROABS 7.4  HGB 9.7*  HCT 31.7*  MCV 96.1  PLT 825   Basic Metabolic Panel Recent Labs    06/02/20 1832 06/03/20 0247 06/03/20 1518 06/04/20 0324  NA 137   < > 140 138  K 6.1*   < > 4.2 3.8  CL 105   < > 108  105  CO2 20*   < > 19* 23  GLUCOSE 146*   < > 143* 105*  BUN 64*   < > 58* 56*  CREATININE 2.27*   < > 2.15* 2.03*  CALCIUM 9.1   < > 9.1 8.8*  MG 2.1  --   --   --    < > = values in this interval not displayed.   Liver Function Tests Recent Labs    06/02/20 1832  AST 30  ALT 21  ALKPHOS 66  BILITOT 0.9  PROT 6.6  ALBUMIN 3.8   No results for input(s): LIPASE, AMYLASE in the last 72 hours. Cardiac Enzymes No results for input(s): CKTOTAL, CKMB, CKMBINDEX, TROPONINI in the last 72 hours.  BNP: BNP (last 3 results) Recent Labs    05/27/20 0828 06/02/20 1832  BNP 2,213.1* 2,338.9*    ProBNP (last 3 results) Recent Labs    05/13/20 1550 05/31/20 0939  PROBNP 46,357* 48,555*     D-Dimer No results for input(s): DDIMER in the last 72 hours. Hemoglobin A1C Recent Labs    06/02/20 1851  HGBA1C 7.3*   Fasting Lipid Panel Recent Labs    06/04/20 0324  CHOL 88  HDL 47  LDLCALC 28  TRIG 67  CHOLHDL 1.9   Thyroid Function Tests Recent  Labs    06/02/20 1832  TSH 0.154*    Other results:   Imaging     No results found.   Medications:     Scheduled Medications: . atorvastatin  20 mg Oral Daily  . carvedilol  3.125 mg Oral BID WC  . colestipol  1 g Oral BID  . dapagliflozin propanediol  10 mg Oral Daily  . furosemide  80 mg Intravenous BID  . gabapentin  300 mg Oral Daily  . insulin aspart  0-5 Units Subcutaneous QHS  . insulin aspart  0-9 Units Subcutaneous TID WC  . mesalamine  4.8 g Oral Q breakfast  . sodium chloride flush  3 mL Intravenous Q12H     Infusions: . sodium chloride       PRN Medications:  sodium chloride, acetaminophen, ondansetron (ZOFRAN) IV, sodium chloride flush   Assessment/Plan   1. Acute on chronic systolic CHF: Cardiolite in 11/21 with EF 26%, inferior fixed perfusion defect with no ischemia.  Echo in 12/21 with EF 35-40%, moderate LV dilation, mildly decreased RV function. Cause of cardiomyopathy  uncertain but there is concern for ischemia cardiomyopathy.  Worsening CHF recently complicated by CKD stage IV. She has had NYHA class III symptoms.  She is diuresing well, creatinine has trended down (2.03 today).  Still volume overloaded on exam.  - Lasix 80 mg IV bid again today.  - Continue Coreg 3.125 mg bid.  - Add dapagliflozin 10 mg daily.  - Will be limited in terms of HF meds we can use with CKD 4 and soft BP.  For now, no ARNI/ARB/digoxin/spironolactone.  - Ideally, would have RHC/LHC to look for CAD as cause of cardiomyopathy after we have diuresed her.  However, will need improvement in creatinine for this.  If we do not cath her, would consider cardiac MRI.  2. CKD: Stage IV.  Creatinine 2.03 today.  Has had recent hyperkalemia, now improved.   - Follow carefully, hopefully will continue to improve with diruesis.  3. Chronic atrial fibrillation: This appears permanent, x years.  Has biatrial severe enlargement and unlikely to hold NSR with DCCV attempt.  - Continue rate control.  - INR 2.2 on warfarin, allow to trend down.  If we do cath, will start heparin gtt when INR < 2.  4. Low TSH: Concern for hyperthyroidism.  Free T4 is high, sending free T3 today.  Suspect that she may need treatment with methimazole.   Length of Stay: 2  Loralie Champagne, MD  06/04/2020, 10:29 AM  Advanced Heart Failure Team Pager [phone] (M-F; 7a - 4p)  Please contact [facility] for night-coverage after hours (4p -7a ) and weekends on [email or web address]

## 2020-06-04 NOTE — Progress Notes (Signed)
[place] for heparin/warfarin Indication: atrial fibrillation  Allergies  Allergen Reactions  . Avandia [Rosiglitazone] Other (See Comments)    Causes muscle weakness.  . Latex Rash  . Sulfa Antibiotics Rash    Patient Measurements: Weight: 80.8 kg (178 lb 3.2 oz) (scale a) Heparin Dosing Weight: 84 kg  Vital Signs: Temp: 97.8 F (36.6 C) (12/31 0441) Temp Source: Oral (12/31 0441) BP: 92/51 (12/31 0441) Pulse Rate: 69 (12/31 0441)  Labs: Recent Labs    06/02/20 1832 06/02/20 2032 06/03/20 0247 06/03/20 1518 06/04/20 0324  HGB 9.7*  --   --   --   --   HCT 31.7*  --   --   --   --   PLT 304  --   --   --   --   APTT  --  35  --   --   --   LABPROT  --  28.5* 31.2*  --  23.7*  INR  --  2.8* 3.1*  --  2.2*  CREATININE 2.27*  --  2.18* 2.15* 2.03*    Estimated Creatinine Clearance: 26 mL/min (A) (by C-G formula based on SCr of 2.03 mg/dL (H)).   Medical History: Past Medical History:  Diagnosis Date  . A-fib ([place])   . Acute on chronic combined systolic (congestive) and diastolic (congestive) heart failure ([place])   . Carotid artery occlusion   . DCM (dilated cardiomyopathy) ([place])    EF 30-35% by echo 05/2020  . Diabetes mellitus   . Hyperlipidemia   . Irregular heart beat   . Vitamin D deficiency      Assessment: 79 yo W on warfarin for afib PTA now holding warfarin for [facility]. Pharmacy consulted to start heparin when INR < 2.5. Admit INR 2.8> down to 2.2 today.   Last Coumadin dose taken 12/28  Warfarin PTA dose: 1.5mg  Fridays, 1mg  all other days (Last seen 05/26/20 by [facility]).     Goal of Therapy:  Heparin level 0.3-0.7 units/ml Monitor platelets by anticoagulation protocol: Yes   Plan:  Hold heparin and warfarin for now Start heparin when INR < 2 - will check with AM labs Monitor daily INR, heparin level, CBC/plt Monitor for signs/symptoms of bleeding   Marguerite Olea, BCCP Clinical Pharmacist   06/04/2020 2:32 PM   [facility] pharmacy phone numbers are listed on [email or web address]

## 2020-06-04 NOTE — Progress Notes (Signed)
Patient refused gabapentin this morning, stating she has only taken that medication one time and that is why she ended up in the hospital with all these problems. Provided education on the chronicity of heart and renal failure and pointed out her weight gain prior to taking this med, but she was unwilling to receive teaching and stated she will not be taking this medication. Requested DC order from physician.

## 2020-06-05 ENCOUNTER — Inpatient Hospital Stay (HOSPITAL_COMMUNITY): Payer: PPO

## 2020-06-05 DIAGNOSIS — J690 Pneumonitis due to inhalation of food and vomit: Secondary | ICD-10-CM

## 2020-06-05 DIAGNOSIS — J9601 Acute respiratory failure with hypoxia: Secondary | ICD-10-CM

## 2020-06-05 DIAGNOSIS — I5021 Acute systolic (congestive) heart failure: Secondary | ICD-10-CM | POA: Diagnosis not present

## 2020-06-05 DIAGNOSIS — I472 Ventricular tachycardia: Secondary | ICD-10-CM

## 2020-06-05 DIAGNOSIS — N170 Acute kidney failure with tubular necrosis: Secondary | ICD-10-CM | POA: Diagnosis not present

## 2020-06-05 DIAGNOSIS — I130 Hypertensive heart and chronic kidney disease with heart failure and stage 1 through stage 4 chronic kidney disease, or unspecified chronic kidney disease: Secondary | ICD-10-CM | POA: Diagnosis not present

## 2020-06-05 LAB — GLUCOSE, CAPILLARY
Glucose-Capillary: 75 mg/dL (ref 70–99)
Glucose-Capillary: 208 mg/dL — ABNORMAL HIGH (ref 70–99)
Glucose-Capillary: 160 mg/dL — ABNORMAL HIGH (ref 70–99)
Glucose-Capillary: 123 mg/dL — ABNORMAL HIGH (ref 70–99)

## 2020-06-05 LAB — PROTIME-INR
INR: 1.9 — ABNORMAL HIGH (ref 0.8–1.2)
Prothrombin Time: 21.1 seconds — ABNORMAL HIGH (ref 11.4–15.2)

## 2020-06-05 LAB — BASIC METABOLIC PANEL
Anion gap: 13 (ref 5–15)
Anion gap: 14 (ref 5–15)
BUN: 53 mg/dL — ABNORMAL HIGH (ref 8–23)
BUN: 57 mg/dL — ABNORMAL HIGH (ref 8–23)
CO2: 24 mmol/L (ref 22–32)
CO2: 22 mmol/L (ref 22–32)
Calcium: 9 mg/dL (ref 8.9–10.3)
Calcium: 8.7 mg/dL — ABNORMAL LOW (ref 8.9–10.3)
Chloride: 104 mmol/L (ref 98–111)
Chloride: 101 mmol/L (ref 98–111)
Creatinine, Ser: 2.05 mg/dL — ABNORMAL HIGH (ref 0.44–1.00)
Creatinine, Ser: 2.1 mg/dL — ABNORMAL HIGH (ref 0.44–1.00)
GFR, Estimated: 24 mL/min — ABNORMAL LOW (ref >60)
GFR, Estimated: 24 mL/min — ABNORMAL LOW (ref >60)
Glucose, Bld: 97 mg/dL (ref 70–99)
Glucose, Bld: 158 mg/dL — ABNORMAL HIGH (ref 70–99)
Potassium: 3.9 mmol/L (ref 3.5–5.1)
Potassium: 3.8 mmol/L (ref 3.5–5.1)
Sodium: 141 mmol/L (ref 135–145)
Sodium: 137 mmol/L (ref 135–145)

## 2020-06-05 LAB — TROPONIN I (HIGH SENSITIVITY)
Troponin I (High Sensitivity): 1317 ng/L (ref <18)
Troponin I (High Sensitivity): 4017 ng/L (ref <18)

## 2020-06-05 LAB — MAGNESIUM: Magnesium: 1.9 mg/dL (ref 1.7–2.4)

## 2020-06-05 LAB — MRSA PCR SCREENING: MRSA by PCR: NEGATIVE

## 2020-06-05 LAB — HEPARIN LEVEL (UNFRACTIONATED): Heparin Unfractionated: 0.98 IU/mL — ABNORMAL HIGH (ref 0.30–0.70)

## 2020-06-05 LAB — T3, FREE: T3, Free: 2.8 pg/mL (ref 2.0–4.4)

## 2020-06-05 MED ORDER — SODIUM CHLORIDE 0.9 % IV SOLN: 1.5000 g | Freq: Four times a day (QID) | INTRAVENOUS | Status: DC

## 2020-06-05 MED ORDER — OXYCODONE HCL 5 MG PO TABS
5.0000 mg | ORAL_TABLET | Freq: Four times a day (QID) | ORAL | Status: AC | PRN
  Administered 2020-06-06 – 2020-06-07 (×2): 5 mg via ORAL
  Filled 2020-06-05 (×2): qty 1

## 2020-06-05 MED ORDER — SODIUM CHLORIDE 0.9 % IV SOLN
1.0000 g | INTRAVENOUS | Status: DC
  Administered 2020-06-05 – 2020-06-13 (×9): 1 g via INTRAVENOUS
  Filled 2020-06-05 (×3): qty 10
  Filled 2020-06-05 (×2): qty 1
  Filled 2020-06-05 (×4): qty 10

## 2020-06-05 MED ORDER — ONDANSETRON HCL 4 MG/2ML IJ SOLN: 4.0000 mg | Freq: Once | INTRAMUSCULAR | Status: DC

## 2020-06-05 MED ORDER — ACETAMINOPHEN 500 MG PO TABS
1000.0000 mg | ORAL_TABLET | Freq: Three times a day (TID) | ORAL | Status: DC
  Administered 2020-06-05 – 2020-06-16 (×27): 1000 mg via ORAL
  Filled 2020-06-05 (×26): qty 2

## 2020-06-05 MED ORDER — MELATONIN 3 MG PO TABS
3.0000 mg | ORAL_TABLET | Freq: Every day | ORAL | Status: DC
  Administered 2020-06-05 – 2020-06-15 (×11): 3 mg via ORAL
  Filled 2020-06-05 (×11): qty 1

## 2020-06-05 MED ORDER — FENTANYL CITRATE (PF) 100 MCG/2ML IJ SOLN
25.0000 ug | Freq: Once | INTRAMUSCULAR | Status: AC
  Administered 2020-06-05: 25 ug via INTRAVENOUS
  Filled 2020-06-05: qty 2

## 2020-06-05 MED ORDER — SODIUM CHLORIDE 0.9 % IV SOLN: 1.0000 g | INTRAVENOUS | Status: DC

## 2020-06-05 MED ORDER — HEPARIN BOLUS VIA INFUSION
4000.0000 [IU] | Freq: Once | INTRAVENOUS | Status: AC
  Administered 2020-06-05: 4000 [IU] via INTRAVENOUS
  Filled 2020-06-05: qty 4000

## 2020-06-05 MED ORDER — CHLORHEXIDINE GLUCONATE CLOTH 2 % EX PADS
6.0000 | MEDICATED_PAD | Freq: Every day | CUTANEOUS | Status: DC
  Administered 2020-06-05 – 2020-06-08 (×4): 6 via TOPICAL

## 2020-06-05 MED ORDER — AMIODARONE HCL IN DEXTROSE 360-4.14 MG/200ML-% IV SOLN
60.0000 mg/h | INTRAVENOUS | Status: AC
  Administered 2020-06-05: 60 mg/h via INTRAVENOUS

## 2020-06-05 MED ORDER — AMIODARONE HCL IN DEXTROSE 360-4.14 MG/200ML-% IV SOLN
30.0000 mg/h | INTRAVENOUS | Status: DC
  Administered 2020-06-06: 30 mg/h via INTRAVENOUS
  Filled 2020-06-05 (×2): qty 200

## 2020-06-05 MED ORDER — HEPARIN (PORCINE) 25000 UT/250ML-% IV SOLN
1050.0000 [IU]/h | INTRAVENOUS | Status: DC
  Administered 2020-06-05: 1200 [IU]/h via INTRAVENOUS
  Administered 2020-06-06: 1050 [IU]/h via INTRAVENOUS
  Filled 2020-06-05 (×3): qty 250

## 2020-06-05 NOTE — Progress Notes (Addendum)
Patient ID: Cynthia Hansen, female   DOB: [birth year], 80 y.o.   MRN: [id number]     Advanced Heart Failure Rounding Note  PCP-Cardiologist: Larae Grooms, MD   Subjective:    Breathing is improving.  I/Os not recorded particularly negative but weight down 4 lbs.  SBP 90s-100s.  Breathing is better.  Creatinine stable at 2.05.   Of note, TSH is low and free T4 is high with normal free T3.    Objective:   Weight Range: 79.3 kg Body mass index is 25.81 kg/m.   Vital Signs:   Temp:  [97.6 F (36.4 C)-98 F (36.7 C)] 98 F (36.7 C) (01/01 0623) Pulse Rate:  [71-88] 88 (01/01 0623) Resp:  [15-20] 15 (01/01 0623) BP: (97-107)/(68-74) 99/74 (01/01 0623) SpO2:  [96 %-100 %] 96 % (01/01 0623) Weight:  [79.3 kg] 79.3 kg (01/01 0011) Last BM Date: 06/04/20  Weight change: Filed Weights   06/03/20 0500 06/04/20 0441 06/05/20 0011  Weight: 83.3 kg 80.8 kg 79.3 kg    Intake/Output:   Intake/Output Summary (Last 24 hours) at 06/05/2020 1144 Last data filed at 06/05/2020 0857 Gross per 24 hour  Intake 460 ml  Output 1500 ml  Net -1040 ml      Physical Exam    General: NAD Neck: JVP 8-9 cm, no thyromegaly or thyroid nodule.  Lungs: Clear to auscultation bilaterally with normal respiratory effort. CV: Nondisplaced PMI.  Heart irregular S1/S2, no S3/S4, no murmur.  1+ left ankle edema.   Abdomen: Soft, nontender, no hepatosplenomegaly, no distention.  Skin: Intact without lesions or rashes.  Neurologic: Alert and oriented x 3.  Psych: Normal affect. Extremities: No clubbing or cyanosis.  HEENT: Normal.    Telemetry   Atrial fibrillation 80s-90s (personally reviewed)   Labs    CBC Recent Labs    06/02/20 1832  WBC 9.6  NEUTROABS 7.4  HGB 9.7*  HCT 31.7*  MCV 96.1  PLT 030   Basic Metabolic Panel Recent Labs    06/02/20 1832 06/03/20 0247 06/04/20 0324 06/05/20 0159  NA 137   < > 138 141  K 6.1*   < > 3.8 3.9  CL 105   < > 105 104  CO2 20*   < > 23  24  GLUCOSE 146*   < > 105* 97  BUN 64*   < > 56* 53*  CREATININE 2.27*   < > 2.03* 2.05*  CALCIUM 9.1   < > 8.8* 9.0  MG 2.1  --   --   --    < > = values in this interval not displayed.   Liver Function Tests Recent Labs    06/02/20 1832  AST 30  ALT 21  ALKPHOS 66  BILITOT 0.9  PROT 6.6  ALBUMIN 3.8   No results for input(s): LIPASE, AMYLASE in the last 72 hours. Cardiac Enzymes No results for input(s): CKTOTAL, CKMB, CKMBINDEX, TROPONINI in the last 72 hours.  BNP: BNP (last 3 results) Recent Labs    05/27/20 0828 06/02/20 1832  BNP 2,213.1* 2,338.9*    ProBNP (last 3 results) Recent Labs    05/13/20 1550 05/31/20 0939  PROBNP 46,357* 48,555*     D-Dimer No results for input(s): DDIMER in the last 72 hours. Hemoglobin A1C Recent Labs    06/02/20 1851  HGBA1C 7.3*   Fasting Lipid Panel Recent Labs    06/04/20 0324  CHOL 88  HDL 47  LDLCALC 28  TRIG 67  CHOLHDL  1.9   Thyroid Function Tests Recent Labs    06/02/20 1832 06/04/20 1119  TSH 0.154*  --   T3FREE  --  2.8    Other results:   Imaging    No results found.   Medications:     Scheduled Medications: . atorvastatin  20 mg Oral Daily  . carvedilol  3.125 mg Oral BID WC  . colestipol  1 g Oral BID  . dapagliflozin propanediol  10 mg Oral Daily  . furosemide  80 mg Intravenous BID  . insulin aspart  0-5 Units Subcutaneous QHS  . insulin aspart  0-9 Units Subcutaneous TID WC  . mesalamine  4.8 g Oral Q breakfast  . sodium chloride flush  3 mL Intravenous Q12H    Infusions: . sodium chloride    . heparin 1,200 Units/hr (06/05/20 1006)    PRN Medications: sodium chloride, acetaminophen, ondansetron (ZOFRAN) IV, sodium chloride flush   Assessment/Plan   1. Acute on chronic systolic CHF: Cardiolite in 11/21 with EF 26%, inferior fixed perfusion defect with no ischemia.  Echo in 12/21 with EF 35-40%, moderate LV dilation, mildly decreased RV function. Cause of  cardiomyopathy uncertain but there is concern for ischemic cardiomyopathy.  Worsening CHF recently complicated by CKD stage IV. She has had NYHA class III symptoms.  She is diuresing well with weight coming down, creatinine is stable today at 2.05.  Still volume overloaded on exam but improving.  - Lasix 80 mg IV bid again today, probably to po tomorrow.  - Continue Coreg 3.125 mg bid.  - Continue dapagliflozin 10 mg daily.  - Will be limited in terms of HF meds we can use with CKD 4, history of hyperkalemia, and soft BP.  For now, no ARNI/ARB/digoxin/spironolactone.  I am leery about starting spironolactone given recent hyperkalemia.  - Ideally, would have RHC/LHC to look for CAD as cause of cardiomyopathy after we have diuresed her.  However, will need improvement in creatinine for this.  If creatinine is not lower by Monday, will plan RHC only and will get cardiac MRI to look for evidence of CAD (infarction) or infiltrative disease.   2. CKD: Stage IV.  Creatinine 2.05 today.  Has had recent hyperkalemia, now improved.   - Follow carefully, hopefully will continue to improve with diuresis.  3. Chronic atrial fibrillation: This appears permanent, x years.  Has biatrial severe enlargement and unlikely to hold NSR with DCCV attempt.  - Continue rate control.  - INR 1.9, possible cath Monday so hold warfarin and cover with heparin gtt.  4. Low TSH: Concern for hyperthyroidism.  Free T4 is high, but free T3 is normal.  Will resend thyroid indices tomorrow and thyroid stimulating antibody level.   Length of Stay: 3  Loralie Champagne, MD  06/05/2020, 11:44 AM  Advanced Heart Failure Team Pager [phone] (M-F; 7a - 4p)  Please contact [facility] for night-coverage after hours (4p -7a ) and weekends on [email or web address]

## 2020-06-05 NOTE — Progress Notes (Signed)
Patient arrived to unit during change of shift. Skin assessment performed with writer and Luberta Mutter RN day nurse. Patient noted to have sacral redness, blanchable, aquacel foam placed. Patient also noted to have 2 skin tears on right forearm and 1 skin tare to left forearm. Eccymosis bilateral extremities and chest area noted. Patient on non rebreather stating at 100%. Cardiac controlled a-fib with frequent PVC's. See flow sheet for further assessment. Will continue to monitor.   Patient oriented to unit and directed on how to use call bell. Is aware of strict bed rest. Safety measures in place and bed locked in lowest position.  Patient currently complaining of new back pain and pain to left shoulder. Dilaudid order put in, See MAR. Electrolyte orders to be placed and Stat EKG put in.

## 2020-06-05 NOTE — Progress Notes (Signed)
Patient placed on amiodarone gtt. continuous infusion scheduled to start at 0200. Spoke to Dr. Aundra Dubin to verify and will start dose now per Dr.

## 2020-06-05 NOTE — Progress Notes (Signed)
   06/05/20 1801  Clinical Encounter Type  Visited With Patient and family together  Visit Type Code  Referral From Nurse  Consult/Referral To Chaplain  Stress Factors  Family Stress Factors Health changes   Chaplain responded to Code Blue. Pt's daughter, Abigail Butts, was at bedside. Chaplain was able talk with both Pt and Pt's daughter. Pt's daughter stated she was still in shock. When Chaplain checked back later, both Pt and Pt's daughter were unavailable. Chaplain remains available as needed.  This note was prepared by Chaplain Resident, Dante Gang, MDiv. Chaplain remains available as needed through the on-call pager: [phone].

## 2020-06-05 NOTE — Progress Notes (Signed)
[place] for heparin/warfarin Indication: atrial fibrillation  Allergies  Allergen Reactions  . Avandia [Rosiglitazone] Other (See Comments)    Causes muscle weakness.  . Latex Rash  . Sulfa Antibiotics Rash    Patient Measurements: Weight: 79.3 kg (174 lb 12.8 oz) Heparin Dosing Weight: 79.3 kg  Vital Signs: Temp: 98.9 F (37.2 C) (01/01 1612) Temp Source: Oral (01/01 1612) BP: 112/58 (01/01 1612) Pulse Rate: 75 (01/01 1612)  Labs: Recent Labs    06/02/20 2032 06/02/20 2032 06/03/20 0247 06/03/20 1518 06/04/20 0324 06/05/20 0159 06/05/20 1740  APTT 35  --   --   --   --   --   --   LABPROT 28.5*  --  31.2*  --  23.7* 21.1*  --   INR 2.8*  --  3.1*  --  2.2* 1.9*  --   HEPARINUNFRC  --   --   --   --   --   --  0.98*  CREATININE  --    < > 2.18* 2.15* 2.03* 2.05*  --    < > = values in this interval not displayed.    Estimated Creatinine Clearance: 23.6 mL/min (A) (by C-G formula based on SCr of 2.05 mg/dL (H)).   Medical History: Past Medical History:  Diagnosis Date  . A-fib ([place])   . Acute on chronic combined systolic (congestive) and diastolic (congestive) heart failure ([place])   . Carotid artery occlusion   . DCM (dilated cardiomyopathy) ([place])    EF 30-35% by echo 05/2020  . Diabetes mellitus   . Hyperlipidemia   . Irregular heart beat   . Vitamin D deficiency      Assessment: [name] on warfarin for afib PTA now holding warfarin for [facility]. Pharmacy consulted to start heparin when INR < 2.5, now wanting <2. Admit INR 2.8> down to 1.9 today.   Last Coumadin dose taken 12/28. -heparin level= 0.98  Warfarin PTA dose: 1.5mg  Fridays, 1mg  all other days (Last seen 05/26/20 by [facility]).   Goal of Therapy:  Heparin level 0.3-0.7 units/ml Monitor platelets by anticoagulation protocol: Yes   Plan:  Decrease heparin to 1050 units/hr Monitor daily INR, HL, CBC  Hildred Laser, PharmD Clinical Pharmacist **Pharmacist  phone directory can now be found on [email or web address] ([email or web address]).  Listed under [place].

## 2020-06-05 NOTE — Progress Notes (Signed)
Patient ID: CYMONE YESKE, female   DOB: 05/03/1941, 80 y.o.   MRN: [id number]  Patient evaluated after code blue, pulseless VT. Documentation somewhat difficult but sounds like she had about 4 minutes of CPR and was shocked for VT back to NSR.  I was able to find strip saved under media tab showing VT. She is currently in atrial fibrillation, which is chronic for her.  CXR with RLL opacity, ?aspiration during this event. She is now awake and alert, on oxygen by facemask. No complaints.  - Will start amiodarone gtt w/o bolus.  - Reviewed with CCM, will add abx for coverage of aspiration.  - Labs have been sent, BMET/Mg/troponin. - Repeat ECG.   Loralie Champagne 06/05/2020 8:25 PM

## 2020-06-05 NOTE — Evaluation (Signed)
Physical Therapy Evaluation Patient Details Name: Cynthia Hansen MRN: [id number] DOB: 1940/07/28 Today's Date: 06/05/2020   History of Present Illness  80yo female admitted with acute systolic CHF. PMH A-fib, acute on chronic heart failure, DCM wtih 30-35% ejection fraction, DM, HLD  Clinical Impression   Patient received in bed, very pleasant and talkative this afternoon. Able to mobilize on a min guard-MinA level with no device, initially steady on her feet but as fatigue increased she did require MinA to maintain balance and safety. HR up to as much as 146BPM with activity but recovered well back to 80-100BPM with seated rest. Education provided regarding PT POC/recommendations moving forward, as well as the value and benefit of strength training and possible return to OP PT for back pain management/advanced strengthening once the dust settles on her acute medical condition and she is feeling better/is more stable. Left up in recliner with all needs met, daughter present and RN aware of patient status. Will benefit from skilled HHPT follow-up moving forward.     Follow Up Recommendations Home health PT    Equipment Recommendations  3in1 (PT) (already has rollator)    Recommendations for Other Services       Precautions / Restrictions Precautions Precautions: Fall;Other (comment) Precaution Comments: watch HR, chronic BLE numbness Restrictions Weight Bearing Restrictions: No      Mobility  Bed Mobility Overal bed mobility: Needs Assistance Bed Mobility: Supine to Sit     Supine to sit: Supervision;HOB elevated     General bed mobility comments: S for safety/line management, HOB mildly elevated    Transfers Overall transfer level: Needs assistance Equipment used: None Transfers: Sit to/from Stand Sit to Stand: Min guard         General transfer comment: min guard for safety, no physical assist given  Ambulation/Gait Ambulation/Gait assistance: Min guard;Min  assist Gait Distance (Feet): 140 Feet Assistive device: 1 person hand held assist Gait Pattern/deviations: Step-through pattern;Drifts right/left;Narrow base of support Gait velocity: decreased   General Gait Details: at first only needed min guard for gait in hallway, as fatigue increased she began to need MinA via 1 person HHA due to increased unsteadiness. HR up to 146BPM with gait but recovered back to 80-100BPM with seated rest.  Stairs            Wheelchair Mobility    Modified Rankin (Stroke Patients Only)       Balance Overall balance assessment: Needs assistance Sitting-balance support: Bilateral upper extremity supported;Feet supported Sitting balance-Leahy Scale: Good Sitting balance - Comments: able to reach and pull her socks up in long sitting (but tight hamstrings)   Standing balance support: No upper extremity supported;During functional activity Standing balance-Leahy Scale: Fair Standing balance comment: mild unsteadiness increases with fatigue and she ultimately needed MinA to maintain balance dynamically                             Pertinent Vitals/Pain Pain Assessment: No/denies pain    Home Living Family/patient expects to be discharged to:: Private residence Living Arrangements: Alone Available Help at Discharge: Family;Available PRN/intermittently (daughter works full time but can help on and off) Type of Home: Other(Comment) (2 story condo) Home Access: Stairs to enter Entrance Stairs-Rails: Right Entrance Stairs-Number of Steps: 3-4 tall steps with R ascending rail- steps are very tall and she has had trouble with this for awhile Home Layout: Two level Home Equipment: Shower seat;Cane - single point;Walker - 4  wheels Additional Comments: on list for independent condo in [place]    Prior Function Level of Independence: Independent         Comments: has DME from her late husband but does not really use it     Hand  Dominance        Extremity/Trunk Assessment   Upper Extremity Assessment Upper Extremity Assessment: Generalized weakness    Lower Extremity Assessment Lower Extremity Assessment: Generalized weakness    Cervical / Trunk Assessment Cervical / Trunk Assessment: Kyphotic  Communication   Communication: No difficulties  Cognition Arousal/Alertness: Awake/alert Behavior During Therapy: WFL for tasks assessed/performed Overall Cognitive Status: Within Functional Limits for tasks assessed                                 General Comments: very pleasant and talkative but very verbose      General Comments General comments (skin integrity, edema, etc.): HR to 146BPM at most, recovered as expected with seated rest    Exercises     Assessment/Plan    PT Assessment Patient needs continued PT services  PT Problem List Decreased strength;Decreased knowledge of use of DME;Decreased activity tolerance;Decreased balance;Decreased mobility;Cardiopulmonary status limiting activity;Decreased coordination       PT Treatment Interventions DME instruction;Balance training;Gait training;Stair training;Functional mobility training;Patient/family education;Therapeutic activities;Therapeutic exercise    PT Goals (Current goals can be found in the Care Plan section)  Acute Rehab PT Goals Patient Stated Goal: feel better, get stronger PT Goal Formulation: With patient/family Time For Goal Achievement: 06/19/20 Potential to Achieve Goals: Good    Frequency Min 3X/week   Barriers to discharge        Co-evaluation               AM-PAC PT "6 Clicks" Mobility  Outcome Measure Help needed turning from your back to your side while in a flat bed without using bedrails?: None Help needed moving from lying on your back to sitting on the side of a flat bed without using bedrails?: None Help needed moving to and from a bed to a chair (including a wheelchair)?: A Little Help  needed standing up from a chair using your arms (e.g., wheelchair or bedside chair)?: A Little Help needed to walk in hospital room?: A Little Help needed climbing 3-5 steps with a railing? : A Lot 6 Click Score: 19    End of Session Equipment Utilized During Treatment: Gait belt Activity Tolerance: Patient tolerated treatment well Patient left: in chair;with call bell/phone within reach;with family/visitor present Nurse Communication: Mobility status;Other (comment) (HR elevation) PT Visit Diagnosis: Unsteadiness on feet (R26.81);Muscle weakness (generalized) (M62.81);Difficulty in walking, not elsewhere classified (R26.2)    Time: 9021-1155 PT Time Calculation (min) (ACUTE ONLY): 38 min   Charges:   PT Evaluation $PT Eval Moderate Complexity: 1 Mod PT Treatments $Gait Training: 8-22 mins $Self Care/Home Management: 8-22        Windell Norfolk, DPT, PN1   Supplemental Physical Therapist [place]    Pager [phone] Acute Rehab Office [phone]

## 2020-06-05 NOTE — Progress Notes (Signed)
[place] for heparin/warfarin Indication: atrial fibrillation  Allergies  Allergen Reactions  . Avandia [Rosiglitazone] Other (See Comments)    Causes muscle weakness.  . Latex Rash  . Sulfa Antibiotics Rash    Patient Measurements: Weight: 79.3 kg (174 lb 12.8 oz) Heparin Dosing Weight: 79.3 kg  Vital Signs: Temp: 98 F (36.7 C) (01/01 0623) Temp Source: Oral (01/01 0623) BP: 99/74 (01/01 0623) Pulse Rate: 88 (01/01 0623)  Labs: Recent Labs    06/02/20 1832 06/02/20 2032 06/02/20 2032 06/03/20 0247 06/03/20 1518 06/04/20 0324 06/05/20 0159  HGB 9.7*  --   --   --   --   --   --   HCT 31.7*  --   --   --   --   --   --   PLT 304  --   --   --   --   --   --   APTT  --  35  --   --   --   --   --   LABPROT  --  28.5*   < > 31.2*  --  23.7* 21.1*  INR  --  2.8*   < > 3.1*  --  2.2* 1.9*  CREATININE 2.27*  --   --  2.18* 2.15* 2.03* 2.05*   < > = values in this interval not displayed.    Estimated Creatinine Clearance: 23.6 mL/min (A) (by C-G formula based on SCr of 2.05 mg/dL (H)).   Medical History: Past Medical History:  Diagnosis Date  . A-fib ([place])   . Acute on chronic combined systolic (congestive) and diastolic (congestive) heart failure ([place])   . Carotid artery occlusion   . DCM (dilated cardiomyopathy) ([place])    EF 30-35% by echo 05/2020  . Diabetes mellitus   . Hyperlipidemia   . Irregular heart beat   . Vitamin D deficiency      Assessment: [name] on warfarin for afib PTA now holding warfarin for [facility]. Pharmacy consulted to start heparin when INR < 2.5, now wanting <2. Admit INR 2.8> down to 1.9 today.   Last Coumadin dose taken 12/28.  Warfarin PTA dose: 1.5mg  Fridays, 1mg  all other days (Last seen 05/26/20 by [facility]). Most recent CBC 12/23 Hgb 9.8, PLTs WNL.   Goal of Therapy:  Heparin level 0.3-0.7 units/ml Monitor platelets by anticoagulation protocol: Yes   Plan:  Hold warfarin Start heparin 4000  mg bolus x1, followed by 1200 units/hr Heparin level in 8 hours Monitor daily INR, HL, CBC Monitor for signs/symptoms of bleeding   Romilda Garret, PharmD PGY1 Acute Care Pharmacy Resident Phone: [phone] 06/05/2020 7:04 AM  Please check [email or web address] for unit specific pharmacy phone numbers.

## 2020-06-05 NOTE — Consult Note (Signed)
NAME:  Cynthia Hansen, MRN:  [id number], DOB:  November 19, 1940, LOS: 3 ADMISSION DATE:  06/02/2020, CONSULTATION DATE: 06/05/20 REFERRING MD:  Sueanne Margarita, MD CHIEF COMPLAINT:  SOB. S/p code blue  Brief History:  80 yo admitted 05/2920 for fluid overload, new dx CHF who had code blue 06/05/20  History of Present Illness:  Admitted 12/29 with fluid overload. 12 pound weight gain in the 2 weeks preceding.  Increased outpatient dose.  However she felt worse.  This prompted admission to ED.  There she was on room air but elevated creatinine.  TTE revealed new reduced EF.  Volume overloaded on exam.  She was diuresed pretty effectively with improvement in some of her symptoms.  On the floor, she had a CODE BLUE cardiac arrest 06/05/2020.  She been feeling pretty well throughout the day.  Telemetry indicated VT. Reportedly received 1 shock, 4 min CPR, no drugs.   Past Medical History:  New cardiomyopathy, persistent A Fib  Significant Hospital Events:  code due to VT 1/12022 ROSC after 1 shock 4 min CPR  Consults:  PCCM, cardiology  Procedures:  n/a  Significant Diagnostic Tests:  Chest x-ray 06/05/2020  Micro Data:  N/a  Antimicrobials:  Ceftriaxone 1/1  Interim History / Subjective:  Coded late in the afternoon.  Reportedly wide-complex.  Confirmed on telemetry via Dr. Marigene Ehlers.  Received 1 shock.  Reportedly CPR for about 4 minutes.  No drugs administered.  She arrived to the ICU conversant, alert.  Never intubated.  Postarrest EKG reveals PVCs with right bundle branch, QTC not prolonged.  Objective   Blood pressure (!) 112/58, pulse 75, temperature 98.9 F (37.2 C), temperature source Oral, resp. rate 16, weight 79.3 kg, SpO2 (!) 78 %.        Intake/Output Summary (Last 24 hours) at 06/05/2020 2040 Last data filed at 06/05/2020 1356 Gross per 24 hour  Intake 480 ml  Output 1700 ml  Net -1220 ml   Filed Weights   06/03/20 0500 06/04/20 0441 06/05/20 0011  Weight: 83.3 kg 80.8  kg 79.3 kg    Examination: General: Elderly, frail Eyes: EOMI, no icterus HENT: Moist mucous membranes, no erythema Lungs: Rhonchi bilaterally particular in the bases, normal for breathing Cardiovascular: Irregularly irregular, no edema of the lower extremities Abdomen: Nondistended, bowel sounds present Neuro: Awake, alert, no focal deficits  Resolved Hospital Problem list   M/a Assessment & Plan:  Cardiac arrest: Presumably wide-complex.  Appreciate cardiologist reviewing telemetry strips, confirming ventricular tachycardia.  Received 1 shock.  4 minutes of CPR.  Was alert, conversant.  Not intubated currently.  Relatively stable hemodynamically with baseline soft blood pressures. --Appreciate cardiology assistance, agree with amiodarone given V. tach leading to arrest and normal QTC on the same day EKG --Repeat BMP, magnesium to see if electrolytes contributed  Hypoxemic respiratory failure: Drop sats to the 80s on nasal cannula after transport to the ICU.  Currently on nonrebreather satting 100.  Suspect can wean.  Because of hypoxemia likely multifactorial.  Chest x-ray reveals right lower lobe infiltrate with improved effusions compared to prior.  Likely aspiration.  Likely atelectasis given arrest as well as chest pain leading to hypoventilation. --Supplemental O2, goal SPO2 greater than 90 --Ceftriaxone for aspiration coverage --Incentive spirometry as able, pain control  Cardiomyopathy: New, presented with decompensated heart failure with reduced EF. --Appreciate cardiology assistance.  Best practice (evaluated daily)  Per primary  Goals of Care:  Per primary Labs   CBC: Recent Labs  Lab  06/02/20 1832  WBC 9.6  NEUTROABS 7.4  HGB 9.7*  HCT 31.7*  MCV 96.1  PLT 811    Basic Metabolic Panel: Recent Labs  Lab 06/02/20 1832 06/03/20 0247 06/03/20 1518 06/04/20 0324 06/05/20 0159  NA 137 138 140 138 141  K 6.1* 4.2 4.2 3.8 3.9  CL 105 105 108 105 104  CO2  20* 22 19* 23 24  GLUCOSE 146* 140* 143* 105* 97  BUN 64* 61* 58* 56* 53*  CREATININE 2.27* 2.18* 2.15* 2.03* 2.05*  CALCIUM 9.1 8.7* 9.1 8.8* 9.0  MG 2.1  --   --   --   --    GFR: Estimated Creatinine Clearance: 23.6 mL/min (A) (by C-G formula based on SCr of 2.05 mg/dL (H)). Recent Labs  Lab 06/02/20 1832  WBC 9.6    Liver Function Tests: Recent Labs  Lab 06/02/20 1832  AST 30  ALT 21  ALKPHOS 66  BILITOT 0.9  PROT 6.6  ALBUMIN 3.8   No results for input(s): LIPASE, AMYLASE in the last 168 hours. No results for input(s): AMMONIA in the last 168 hours.  ABG    Component Value Date/Time   TCO2 28 12/26/2009 1810     Coagulation Profile: Recent Labs  Lab 06/02/20 2032 06/03/20 0247 06/04/20 0324 06/05/20 0159  INR 2.8* 3.1* 2.2* 1.9*    Cardiac Enzymes: No results for input(s): CKTOTAL, CKMB, CKMBINDEX, TROPONINI in the last 168 hours.  HbA1C: Hgb A1c MFr Bld  Date/Time Value Ref Range Status  06/02/2020 06:51 PM 7.3 (H) 4.8 - 5.6 % Final    Comment:    (NOTE) Pre diabetes:          5.7%-6.4%  Diabetes:              >6.4%  Glycemic control for   <7.0% adults with diabetes     CBG: Recent Labs  Lab 06/04/20 1637 06/04/20 2144 06/05/20 0625 06/05/20 1157 06/05/20 1610  GLUCAP 129* 97 75 208* 160*    Review of Systems:   Chest is painful after compressions. Breathing a bit more labored.  Comprehensive review of systems otherwise negative.  Past Medical History:  She,  has a past medical history of A-fib ([place]), Acute on chronic combined systolic (congestive) and diastolic (congestive) heart failure ([place]), Carotid artery occlusion, DCM (dilated cardiomyopathy) ([place]), Diabetes mellitus, Hyperlipidemia, Irregular heart beat, and Vitamin D deficiency.   Surgical History:   Past Surgical History:  Procedure Laterality Date  . ABDOMINAL HERNIA REPAIR    . CAROTID ENDARTERECTOMY Left Dec. 3, 2008    cea  . CATARACT EXTRACTION Left   .  HYSTEROSCOPY WITH D & C N/A 07/30/2014   Procedure: DILATATION AND CURETTAGE /HYSTEROSCOPY;  Surgeon: Osborne Oman, MD;  Location: [facility];  Service: Gynecology;  Laterality: N/A;  . IUD REMOVAL N/A 07/30/2014   Procedure: INTRAUTERINE DEVICE (IUD) REMOVAL;  Surgeon: Osborne Oman, MD;  Location: [facility];  Service: Gynecology;  Laterality: N/A;     Social History:   reports that she quit smoking about 3 years ago. Her smoking use included cigarettes. She quit after 50.00 years of use. She has never used smokeless tobacco. She reports current alcohol use. She reports that she does not use drugs.   Family History:  Her family history includes Cancer in her mother; Heart attack in her father; Hypertension in her sister.   Allergies Allergies  Allergen Reactions  . Avandia [Rosiglitazone] Other (See Comments)  Causes muscle weakness.  . Latex Rash  . Sulfa Antibiotics Rash     Home Medications  Prior to Admission medications   Medication Sig Start Date End Date Taking? Authorizing Provider  acetaminophen (TYLENOL) 500 MG tablet Take 1,000 mg by mouth every 6 (six) hours as needed for mild pain (For back pain.).   Yes [provider]  atorvastatin (LIPITOR) 20 MG tablet TAKE 1 TABLET(20 MG) BY MOUTH DAILY Patient taking differently: Take 20 mg by mouth daily. 01/27/20  Yes Jettie Booze, MD  colestipol (COLESTID) 1 g tablet Take 1 g by mouth 2 (two) times daily.   Yes [provider]  diltiazem (CARDIZEM CD) 180 MG 24 hr capsule TAKE 1 CAPSULE BY MOUTH TWICE DAILY BEFORE MEALS Patient taking differently: Take 180 mg by mouth in the morning and at bedtime. 02/10/20  Yes Jettie Booze, MD  diphenoxylate-atropine (LOMOTIL) 2.5-0.025 MG tablet Take 1 tablet by mouth 2 (two) times daily as needed. 12/18/19  Yes [provider]  furosemide (LASIX) 40 MG tablet Take 1 tablet (40 mg total) by mouth 2 (two) times daily. 05/19/20  Yes Jettie Booze, MD   glimepiride (AMARYL) 2 MG tablet Take 2 mg by mouth daily with breakfast. 03/25/18  Yes [provider]  Liniments (SALONPAS ARTHRITIS PAIN RELIEF EX) Apply 1 application topically daily as needed (For lower back pain).   Yes [provider]  lisinopril (PRINIVIL,ZESTRIL) 5 MG tablet Take 2.5 mg by mouth daily. 07/13/12  Yes [provider]  Vitamin D, Ergocalciferol, (DRISDOL) 1.25 MG (50000 UNIT) CAPS capsule Take 50,000 Units by mouth once a week. 07/31/19  Yes [provider]  warfarin (COUMADIN) 1 MG tablet Take 1 tablet to 1 and 1/2 tablets by mouth daily as directed by the coumadin clinic. Patient taking differently: Take 1-1.5 mg by mouth See admin instructions. Take 1 tablet by mouth every day except on Tuesdays take  1 and 1/2 tablets by mouth daily as directed by the coumadin clinic. 05/26/20  Yes Jettie Booze, MD  ONE TOUCH ULTRA TEST test strip 1 each by Other route as needed (for BG testing).  03/22/14   [provider]     Critical care time:    CRITICAL CARE Performed by: Lanier Clam   Total critical care time: 33 minutes  Critical care time was exclusive of separately billable procedures and treating other patients.  Critical care was necessary to treat or prevent imminent or life-threatening deterioration.  Critical care was time spent personally by me on the following activities: development of treatment plan with patient and/or surrogate as well as nursing, discussions with consultants, evaluation of patient's response to treatment, examination of patient, obtaining history from patient or surrogate, ordering and performing treatments and interventions, ordering and review of laboratory studies, ordering and review of radiographic studies, pulse oximetry and re-evaluation of patient's condition.

## 2020-06-05 NOTE — Code Documentation (Addendum)
  Patient Name: Cynthia Hansen   MRN: [id number]   Date of Birth/ Sex: 1940-11-16 , female      Admission Date: 06/02/2020  Attending Provider: Sueanne Margarita, MD  Primary Diagnosis: <principal problem not specified>   Indication: Pt was in her usual state of health until this PM, when she was noted to be unresponsive. Code blue was subsequently called. At the time of arrival on scene, ACLS protocol was underway, ROSC had already been acheived.   Technical Description:  - CPR performance duration:  4 minutes  - Was defibrillation or cardioversion used? Yes   - Was external pacer placed? No  - Was patient intubated pre/post CPR? No   Medications Administered: Y = Yes; Blank = No Amiodarone    Atropine    Calcium    Epinephrine    Lidocaine    Magnesium    Norepinephrine    Phenylephrine    Sodium bicarbonate    Vasopressin     Post CPR evaluation:  - Final Status - Was patient successfully resuscitated ? Yes - What is current rhythm? A fib with RVR - What is current hemodynamic status? stable  Miscellaneous Information:  - Labs sent, including: N/A  - Primary team notified?  Page was sent out to primary care provider, Dr. Radford Pax. Unable to get in touch with Dr. Radford Pax, notified on call provider, Dr. Vickki Muff.   - Family Notified? Yes  - Additional notes/ transfer status: Transfer to ICU     Andrew Au, MD  06/05/2020, 6:37 PM

## 2020-06-06 DIAGNOSIS — I5021 Acute systolic (congestive) heart failure: Secondary | ICD-10-CM | POA: Diagnosis not present

## 2020-06-06 DIAGNOSIS — I472 Ventricular tachycardia: Secondary | ICD-10-CM

## 2020-06-06 LAB — PROTIME-INR
INR: 1.9 — ABNORMAL HIGH (ref 0.8–1.2)
Prothrombin Time: 21.1 seconds — ABNORMAL HIGH (ref 11.4–15.2)

## 2020-06-06 LAB — BASIC METABOLIC PANEL
Anion gap: 16 — ABNORMAL HIGH (ref 5–15)
BUN: 59 mg/dL — ABNORMAL HIGH (ref 8–23)
CO2: 19 mmol/L — ABNORMAL LOW (ref 22–32)
Calcium: 8.7 mg/dL — ABNORMAL LOW (ref 8.9–10.3)
Chloride: 103 mmol/L (ref 98–111)
Creatinine, Ser: 2.36 mg/dL — ABNORMAL HIGH (ref 0.44–1.00)
GFR, Estimated: 21 mL/min — ABNORMAL LOW (ref >60)
Glucose, Bld: 175 mg/dL — ABNORMAL HIGH (ref 70–99)
Potassium: 5 mmol/L (ref 3.5–5.1)
Sodium: 138 mmol/L (ref 135–145)

## 2020-06-06 LAB — GLUCOSE, CAPILLARY
Glucose-Capillary: 164 mg/dL — ABNORMAL HIGH (ref 70–99)
Glucose-Capillary: 126 mg/dL — ABNORMAL HIGH (ref 70–99)
Glucose-Capillary: 113 mg/dL — ABNORMAL HIGH (ref 70–99)
Glucose-Capillary: 126 mg/dL — ABNORMAL HIGH (ref 70–99)

## 2020-06-06 LAB — TROPONIN I (HIGH SENSITIVITY)
Troponin I (High Sensitivity): 6872 ng/L (ref <18)
Troponin I (High Sensitivity): 7990 ng/L (ref <18)

## 2020-06-06 LAB — HEPARIN LEVEL (UNFRACTIONATED)
Heparin Unfractionated: 1.08 IU/mL — ABNORMAL HIGH (ref 0.30–0.70)
Heparin Unfractionated: 0.42 IU/mL (ref 0.30–0.70)
Heparin Unfractionated: 0.35 IU/mL (ref 0.30–0.70)

## 2020-06-06 LAB — MAGNESIUM: Magnesium: 2 mg/dL (ref 1.7–2.4)

## 2020-06-06 LAB — CBC
HCT: 31.8 % — ABNORMAL LOW (ref 36.0–46.0)
Hemoglobin: 10.3 g/dL — ABNORMAL LOW (ref 12.0–15.0)
MCH: 30.3 pg (ref 26.0–34.0)
MCHC: 32.4 g/dL (ref 30.0–36.0)
MCV: 93.5 fL (ref 80.0–100.0)
Platelets: 297 10*3/uL (ref 150–400)
RBC: 3.4 MIL/uL — ABNORMAL LOW (ref 3.87–5.11)
RDW: 14.3 % (ref 11.5–15.5)
WBC: 13.5 10*3/uL — ABNORMAL HIGH (ref 4.0–10.5)
nRBC: 0 % (ref 0.0–0.2)

## 2020-06-06 LAB — T4, FREE: Free T4: 1.37 ng/dL — ABNORMAL HIGH (ref 0.61–1.12)

## 2020-06-06 LAB — LACTIC ACID, PLASMA: Lactic Acid, Venous: 1 mmol/L (ref 0.5–1.9)

## 2020-06-06 LAB — TSH: TSH: 0.146 u[IU]/mL — ABNORMAL LOW (ref 0.350–4.500)

## 2020-06-06 MED ORDER — SODIUM CHLORIDE 0.9% FLUSH: 3.0000 mL | INTRAVENOUS | Status: DC | PRN

## 2020-06-06 MED ORDER — SODIUM CHLORIDE 0.9 % IV SOLN: 250.0000 mL | INTRAVENOUS | Status: DC | PRN

## 2020-06-06 MED ORDER — HEPARIN (PORCINE) 25000 UT/250ML-% IV SOLN
800.0000 [IU]/h | INTRAVENOUS | Status: DC
  Filled 2020-06-06: qty 250

## 2020-06-06 MED ORDER — AMIODARONE HCL 200 MG PO TABS
200.0000 mg | ORAL_TABLET | Freq: Two times a day (BID) | ORAL | Status: DC
  Administered 2020-06-06 – 2020-06-16 (×21): 200 mg via ORAL
  Filled 2020-06-06 (×21): qty 1

## 2020-06-06 MED ORDER — NOREPINEPHRINE 4 MG/250ML-% IV SOLN
2.0000 ug/min | INTRAVENOUS | Status: DC
  Administered 2020-06-06: 02:00:00 2 ug/min via INTRAVENOUS
  Filled 2020-06-06: qty 250

## 2020-06-06 MED ORDER — ASPIRIN 81 MG PO CHEW
81.0000 mg | CHEWABLE_TABLET | Freq: Every day | ORAL | Status: DC
  Administered 2020-06-06 – 2020-06-16 (×9): 81 mg via ORAL
  Filled 2020-06-06 (×11): qty 1

## 2020-06-06 MED ORDER — HEPARIN (PORCINE) 25000 UT/250ML-% IV SOLN
800.0000 [IU]/h | INTRAVENOUS | Status: DC
  Administered 2020-06-06 – 2020-06-07 (×2): 800 [IU]/h via INTRAVENOUS
  Filled 2020-06-06: qty 250

## 2020-06-06 MED ORDER — ATORVASTATIN CALCIUM 80 MG PO TABS
80.0000 mg | ORAL_TABLET | Freq: Every day | ORAL | Status: DC
  Administered 2020-06-06 – 2020-06-16 (×10): 80 mg via ORAL
  Filled 2020-06-06 (×11): qty 1

## 2020-06-06 MED ORDER — ASPIRIN 81 MG PO CHEW
81.0000 mg | CHEWABLE_TABLET | ORAL | Status: AC
  Administered 2020-06-07: 81 mg via ORAL
  Filled 2020-06-06: qty 1

## 2020-06-06 MED ORDER — SODIUM CHLORIDE 0.9 % IV SOLN: INTRAVENOUS | Status: DC

## 2020-06-06 MED ORDER — SODIUM CHLORIDE 0.9% FLUSH
3.0000 mL | Freq: Two times a day (BID) | INTRAVENOUS | Status: DC
  Administered 2020-06-06 – 2020-06-15 (×11): 3 mL via INTRAVENOUS

## 2020-06-06 MED ORDER — SODIUM CHLORIDE 0.9 % IV SOLN: 250.0000 mL | INTRAVENOUS | Status: DC

## 2020-06-06 MED FILL — Medication: Qty: 1 | Status: AC

## 2020-06-06 NOTE — Plan of Care (Signed)

## 2020-06-06 NOTE — Progress Notes (Addendum)
[place] for heparin/warfarin Indication: atrial fibrillation  Allergies  Allergen Reactions  . Avandia [Rosiglitazone] Other (See Comments)    Causes muscle weakness.  . Latex Rash  . Sulfa Antibiotics Rash    Patient Measurements: Weight: 80.7 kg (177 lb 14.6 oz) Heparin Dosing Weight: 79.3 kg  Vital Signs: Temp: 98.4 F (36.9 C) (01/02 1543) Temp Source: Axillary (01/02 1146) BP: 93/56 (01/02 1700) Pulse Rate: 79 (01/02 1700)  Labs: Recent Labs    06/04/20 0324 06/05/20 0159 06/05/20 1740 06/05/20 2020 06/05/20 2020 06/05/20 2238 06/06/20 0315 06/06/20 0606 06/06/20 1650  HGB  --   --   --   --   --   --  10.3*  --   --   HCT  --   --   --   --   --   --  31.8*  --   --   PLT  --   --   --   --   --   --  297  --   --   LABPROT 23.7* 21.1*  --   --   --   --  21.1*  --   --   INR 2.2* 1.9*  --   --   --   --  1.9*  --   --   HEPARINUNFRC  --   --  0.98*  --   --   --   --  1.08* 0.42  CREATININE 2.03* 2.05*  --  2.10*  --   --  2.36*  --   --   TROPONINIHS  --   --   --  1,317*   < > 4,017* 6,872* 7,990*  --    < > = values in this interval not displayed.    Estimated Creatinine Clearance: 22.3 mL/min (A) (by C-G formula based on SCr of 2.36 mg/dL (H)).   Medical History: Past Medical History:  Diagnosis Date  . A-fib ([place])   . Acute on chronic combined systolic (congestive) and diastolic (congestive) heart failure ([place])   . Carotid artery occlusion   . DCM (dilated cardiomyopathy) ([place])    EF 30-35% by echo 05/2020  . Diabetes mellitus   . Hyperlipidemia   . Irregular heart beat   . Vitamin D deficiency      Assessment: [name] on warfarin for afib PTA now holding warfarin for [facility]. Pharmacy consulted to start heparin when INR < 2.5, now wanting <2. Admit INR 2.8> down to 1.9.   Last Coumadin dose taken 12/28. -heparin level= 0.42  Warfarin PTA dose: 1.5mg  Fridays, 1mg  all other days (Last seen 05/26/20 by  [facility]).    Goal of Therapy:  Heparin level 0.3-0.7 units/ml Monitor platelets by anticoagulation protocol: Yes   Plan:  -Continue heparin at 800 units/hr -Recheck heparin level tonight to confirm  Hildred Laser, PharmD Clinical Pharmacist **Pharmacist phone directory can now be found on [email or web address] ([email or web address]).  Listed under [place].

## 2020-06-06 NOTE — Progress Notes (Signed)
Patient resting in bed near end of shift. Continues on [place] 2L saturation 100%. HR 60's. Patient continues on gtts per [facility]. No complaints of chest heart pain but does complain of chest pain from prior compressions. Patient does have increase in WBC and is on antibiotics for prior possible aspiration during code. Safety measures remain in place. Call light within reach. Will continue to monitor at this time and SBARR to day shift nurse.

## 2020-06-06 NOTE — H&P (View-Only) (Signed)
Patient ID: Cynthia Hansen, female   DOB: February 12, 1941, 80 y.o.   MRN: [id number]     Advanced Heart Failure Rounding Note  PCP-Cardiologist: Larae Grooms, MD   Subjective:    Last night, patient had pulseless VT requiring shock x 1 and CPR x 4 minutes.  She had rapid recovery and was not intubated.  Electrolytes stable.  Amiodarone gtt started.  Due to low BP overnight, she was started on norepinephrine 4 peripherally.  Creatinine rose 2.1 => 2.36 and HS-TnI rose to 7990.  Currently, only mild chest wall tenderness post-CPR.    CXR post-arrest with RLL infiltrate thought to be aspiration, now on ceftriaxone. She is now down to 2L by .   ECG today with unchanged atrial fibrillation and old ASMI. QTc 426.   Of note, TSH is low and free T4 is high with normal free T3.    Objective:   Weight Range: 80.7 kg Body mass index is 26.27 kg/m.   Vital Signs:   Temp:  [97.6 F (36.4 C)-98.9 F (37.2 C)] 97.6 F (36.4 C) (01/02 0655) Pulse Rate:  [53-91] 63 (01/02 0605) Resp:  [12-19] 17 (01/02 0600) BP: (58-112)/(32-71) 103/57 (01/02 0600) SpO2:  [78 %-100 %] 100 % (01/02 0600) Weight:  [80.7 kg] 80.7 kg (01/02 0459) Last BM Date: 06/05/20  Weight change: Filed Weights   06/04/20 0441 06/05/20 0011 06/06/20 0459  Weight: 80.8 kg 79.3 kg 80.7 kg    Intake/Output:   Intake/Output Summary (Last 24 hours) at 06/06/2020 0815 Last data filed at 06/06/2020 0600 Gross per 24 hour  Intake 1110.38 ml  Output 1200 ml  Net -89.62 ml      Physical Exam    General: NAD Neck: JVP 8-9 cm, no thyromegaly or thyroid nodule.  Lungs: Decreased right base.  CV: Nondisplaced PMI.  Heart regular S1/S2, no S3/S4, no murmur.  1+ left ankle edema.  Abdomen: Soft, nontender, no hepatosplenomegaly, no distention.  Skin: Intact without lesions or rashes.  Neurologic: Alert and oriented x 3.  Psych: Normal affect. Extremities: No clubbing or cyanosis.  HEENT: Normal.  MSK: Chest wall mild  tenderness   Telemetry   Atrial fibrillation 70s (personally reviewed)   Labs    CBC Recent Labs    06/06/20 0315  WBC 13.5*  HGB 10.3*  HCT 31.8*  MCV 93.5  PLT 443   Basic Metabolic Panel Recent Labs    06/05/20 2014 06/05/20 2020 06/06/20 0315  NA  --  137 138  K  --  3.8 5.0  CL  --  101 103  CO2  --  22 19*  GLUCOSE  --  158* 175*  BUN  --  57* 59*  CREATININE  --  2.10* 2.36*  CALCIUM  --  8.7* 8.7*  MG 1.9  --  2.0   Liver Function Tests No results for input(s): AST, ALT, ALKPHOS, BILITOT, PROT, ALBUMIN in the last 72 hours. No results for input(s): LIPASE, AMYLASE in the last 72 hours. Cardiac Enzymes No results for input(s): CKTOTAL, CKMB, CKMBINDEX, TROPONINI in the last 72 hours.  BNP: BNP (last 3 results) Recent Labs    05/27/20 0828 06/02/20 1832  BNP 2,213.1* 2,338.9*    ProBNP (last 3 results) Recent Labs    05/13/20 1550 05/31/20 0939  PROBNP 46,357* 48,555*     D-Dimer No results for input(s): DDIMER in the last 72 hours. Hemoglobin A1C No results for input(s): HGBA1C in the last 72 hours. Fasting Lipid  Panel Recent Labs    06/04/20 0324  CHOL 88  HDL 47  LDLCALC 28  TRIG 67  CHOLHDL 1.9   Thyroid Function Tests Recent Labs    06/04/20 1119 06/06/20 0315  TSH  --  0.146*  T3FREE 2.8  --     Other results:   Imaging    DG CHEST PORT 1 VIEW  Result Date: 06/05/2020 CLINICAL DATA:  Post code EXAM: PORTABLE CHEST 1 VIEW COMPARISON:  06/02/2020 FINDINGS: Shallow inspiration. Heart size is enlarged. There is mild vascular congestion with mild perihilar infiltration or edema. Consolidation and effusion in the right lung base similar to prior study. No pneumothorax or pneumomediastinum. Calcification of the aorta. IMPRESSION: Cardiac enlargement with mild vascular congestion and perihilar edema. Consolidation and effusion in the right lung base. Electronically Signed   By: Lucienne Capers M.D.   On: 06/05/2020 18:40      Medications:     Scheduled Medications: . acetaminophen  1,000 mg Oral TID  . amiodarone  200 mg Oral BID  . aspirin  81 mg Oral Daily  . atorvastatin  80 mg Oral Daily  . carvedilol  3.125 mg Oral BID WC  . Chlorhexidine Gluconate Cloth  6 each Topical Daily  . colestipol  1 g Oral BID  . dapagliflozin propanediol  10 mg Oral Daily  . insulin aspart  0-5 Units Subcutaneous QHS  . insulin aspart  0-9 Units Subcutaneous TID WC  . melatonin  3 mg Oral QHS  . mesalamine  4.8 g Oral Q breakfast  . ondansetron (ZOFRAN) IV  4 mg Intravenous Once  . sodium chloride flush  3 mL Intravenous Q12H    Infusions: . sodium chloride    . sodium chloride    . cefTRIAXone (ROCEPHIN)  IV Stopped (06/05/20 2110)  . heparin    . norepinephrine (LEVOPHED) Adult infusion 4 mcg/min (06/06/20 0600)    PRN Medications: sodium chloride, acetaminophen, ondansetron (ZOFRAN) IV, oxyCODONE, sodium chloride flush   Assessment/Plan   1. Acute on chronic systolic CHF: Cardiolite in 11/21 with EF 26%, inferior fixed perfusion defect with no ischemia.  Echo in 12/21 with EF 35-40%, moderate LV dilation, mildly decreased RV function. Cause of cardiomyopathy uncertain but there is concern for ischemic cardiomyopathy.  Worsening CHF recently complicated by CKD stage IV. She has had NYHA class III symptoms.  She diuresed well initially but had VT arrest last night, Lasix stopped. This morning, mild volume overload with JVP 8-9 range but creatinine higher at 2.3.  - Hold Lasix today with higher creatinine, plan for RHC tomorrow to assess filling pressures and cardiac output.  Discussed with patient, she agrees to procedure.  - Think we can titrate off norepinephrine today (SBP generally in 90s prior to arrest, runs soft).  - Continue Coreg 3.125 mg bid if BP stabilizes.  - Continue dapagliflozin 10 mg daily.  - Will be limited in terms of HF meds we can use with CKD 4, history of hyperkalemia, and soft BP.   For now, no ARNI/ARB/digoxin/spironolactone.  I am leery about starting spironolactone given recent hyperkalemia.  - Ideally, would have coronary angiography to look for CAD as cause of cardiomyopathy and VT.  However, creatinine is up today to 2.3.  Will reassess tomorrow but probably will need to hold off until creatinine lower.  2. CKD: Stage IV.  Creatinine higher at 2.3 today.  Has had recent hyperkalemia, now improved.   - Holding Lasix today, as above.  -  RHC tomorrow to assess filling pressures, cardiac output.  3. Chronic atrial fibrillation: This appears permanent, x years.  Has biatrial severe enlargement and unlikely to hold NSR with DCCV attempt.  - Continue rate control.  - INR 1.9, cath Monday so hold warfarin and cover with heparin gtt.  4. Low TSH: Concern for hyperthyroidism.  Free T4 is high, but free T3 is normal.  Have resent thyroid indices tomorrow and thyroid stimulating antibody level.  5. CAD: NSTEMI with HS-TnI up to 7990 post-VT arrest and CPR.  As above, have been concerned for CAD as cause of cardiomyopathy.  ?Rise in troponin due to demand ischemia versus ACS => no ischemic pain (just chest wall tenderness post-CPR) and no ECG change (old ASMI as in past).   - Cover with heparin gtt while INR low.  - Increase atorvastatin to 80 mg daily.  - Add ASA 81 daily.  - Ideally, would do coronary angiography but think we need improvement in creatinine for that. Probably just RHC tomorrow.  - Will probably get cardiac MRI to look for coronary-pattern scarring (versus some form of infiltrative disease).  6. Cardiac arrest: Pulseless VT.  Electrolytes stable. No chest pain or dyspnea prior.  ?Cause => suspect scar-mediated.  - Can stop amiodarone gtt and start amiodarone 200 mg po bid.  - Will need coronary angiography versus cardiac MRI depending on creatinine trend.  - Will need EP to evaluate for ICD.  7. Pulmonary: RLL PNA post-arrest by CXR.  Suspect aspiration. Afebrile.   - She is now on ceftriaxone.  Mobilize out of bed  CRITICAL CARE Performed by: Loralie Champagne  Total critical care time: 35 minutes  Critical care time was exclusive of separately billable procedures and treating other patients.  Critical care was necessary to treat or prevent imminent or life-threatening deterioration.  Critical care was time spent personally by me on the following activities: development of treatment plan with patient and/or surrogate as well as nursing, discussions with consultants, evaluation of patient's response to treatment, examination of patient, obtaining history from patient or surrogate, ordering and performing treatments and interventions, ordering and review of laboratory studies, ordering and review of radiographic studies, pulse oximetry and re-evaluation of patient's condition.  Length of Stay: 4  Loralie Champagne, MD  06/06/2020, 8:15 AM  Advanced Heart Failure Team Pager [phone] (M-F; 7a - 4p)  Please contact [facility] for night-coverage after hours (4p -7a ) and weekends on [email or web address]

## 2020-06-06 NOTE — Progress Notes (Signed)
[place] for Heparin  Indication: atrial fibrillation  Allergies  Allergen Reactions  . Avandia [Rosiglitazone] Other (See Comments)    Causes muscle weakness.  . Latex Rash  . Sulfa Antibiotics Rash    Patient Measurements: Weight: 80.7 kg (177 lb 14.6 oz) Heparin Dosing Weight: 79.3 kg  Vital Signs: Temp: 98.4 F (36.9 C) (01/02 2002) Temp Source: Oral (01/02 2002) BP: 99/46 (01/02 2300) Pulse Rate: 83 (01/02 2300)  Labs: Recent Labs    06/04/20 0324 06/05/20 0159 06/05/20 1740 06/05/20 2020 06/05/20 2238 06/06/20 0315 06/06/20 0606 06/06/20 1650 06/06/20 2252  HGB  --   --   --   --   --  10.3*  --   --   --   HCT  --   --   --   --   --  31.8*  --   --   --   PLT  --   --   --   --   --  297  --   --   --   LABPROT 23.7* 21.1*  --   --   --  21.1*  --   --   --   INR 2.2* 1.9*  --   --   --  1.9*  --   --   --   HEPARINUNFRC  --   --    < >  --   --   --  1.08* 0.42 0.35  CREATININE 2.03* 2.05*  --  2.10*  --  2.36*  --   --   --   TROPONINIHS  --   --    < > 1,317* 4,017* 6,872* 7,990*  --   --    < > = values in this interval not displayed.    Estimated Creatinine Clearance: 22.3 mL/min (A) (by C-G formula based on SCr of 2.36 mg/dL (H)).   Medical History: Past Medical History:  Diagnosis Date  . A-fib ([place])   . Acute on chronic combined systolic (congestive) and diastolic (congestive) heart failure ([place])   . Carotid artery occlusion   . DCM (dilated cardiomyopathy) ([place])    EF 30-35% by echo 05/2020  . Diabetes mellitus   . Hyperlipidemia   . Irregular heart beat   . Vitamin D deficiency      Assessment: [name] on warfarin for afib PTA now holding warfarin for [facility]. Pharmacy consulted to start heparin when INR < 2.5, now wanting <2. Admit INR 2.8> down to 1.9.   Last Coumadin dose taken 12/28. -heparin level= 0.42  Warfarin PTA dose: 1.5mg  Fridays, 1mg  all other days (Last seen 05/26/20 by [facility]).    1/2 PM update:  Heparin level therapeutic x 2    Goal of Therapy:  Heparin level 0.3-0.7 units/ml Monitor platelets by anticoagulation protocol: Yes   Plan:  -Continue heparin at 800 units/hr -Daily CBC/HL -Monitor for bleeding  Narda Bonds, PharmD, BCPS Clinical Pharmacist Phone: [phone]

## 2020-06-06 NOTE — Progress Notes (Addendum)
Patient ID: Cynthia Hansen, female   DOB: 29-Oct-1940, 80 y.o.   MRN: [id number]     Advanced Heart Failure Rounding Note  PCP-Cardiologist: Larae Grooms, MD   Subjective:    Last night, patient had pulseless VT requiring shock x 1 and CPR x 4 minutes.  She had rapid recovery and was not intubated.  Electrolytes stable.  Amiodarone gtt started.  Due to low BP overnight, she was started on norepinephrine 4 peripherally.  Creatinine rose 2.1 => 2.36 and HS-TnI rose to 7990.  Currently, only mild chest wall tenderness post-CPR.    CXR post-arrest with RLL infiltrate thought to be aspiration, now on ceftriaxone. She is now down to 2L by [place].   ECG today with unchanged atrial fibrillation and old ASMI. QTc 426.   Of note, TSH is low and free T4 is high with normal free T3.    Objective:   Weight Range: 80.7 kg Body mass index is 26.27 kg/m.   Vital Signs:   Temp:  [97.6 F (36.4 C)-98.9 F (37.2 C)] 97.6 F (36.4 C) (01/02 0655) Pulse Rate:  [53-91] 63 (01/02 0605) Resp:  [12-19] 17 (01/02 0600) BP: (58-112)/(32-71) 103/57 (01/02 0600) SpO2:  [78 %-100 %] 100 % (01/02 0600) Weight:  [80.7 kg] 80.7 kg (01/02 0459) Last BM Date: 06/05/20  Weight change: Filed Weights   06/04/20 0441 06/05/20 0011 06/06/20 0459  Weight: 80.8 kg 79.3 kg 80.7 kg    Intake/Output:   Intake/Output Summary (Last 24 hours) at 06/06/2020 0815 Last data filed at 06/06/2020 0600 Gross per 24 hour  Intake 1110.38 ml  Output 1200 ml  Net -89.62 ml      Physical Exam    General: NAD Neck: JVP 8-9 cm, no thyromegaly or thyroid nodule.  Lungs: Decreased right base.  CV: Nondisplaced PMI.  Heart regular S1/S2, no S3/S4, no murmur.  1+ left ankle edema.  Abdomen: Soft, nontender, no hepatosplenomegaly, no distention.  Skin: Intact without lesions or rashes.  Neurologic: Alert and oriented x 3.  Psych: Normal affect. Extremities: No clubbing or cyanosis.  HEENT: Normal.  MSK: Chest wall mild  tenderness   Telemetry   Atrial fibrillation 70s (personally reviewed)   Labs    CBC Recent Labs    06/06/20 0315  WBC 13.5*  HGB 10.3*  HCT 31.8*  MCV 93.5  PLT 740   Basic Metabolic Panel Recent Labs    06/05/20 2014 06/05/20 2020 06/06/20 0315  NA  --  137 138  K  --  3.8 5.0  CL  --  101 103  CO2  --  22 19*  GLUCOSE  --  158* 175*  BUN  --  57* 59*  CREATININE  --  2.10* 2.36*  CALCIUM  --  8.7* 8.7*  MG 1.9  --  2.0   Liver Function Tests No results for input(s): AST, ALT, ALKPHOS, BILITOT, PROT, ALBUMIN in the last 72 hours. No results for input(s): LIPASE, AMYLASE in the last 72 hours. Cardiac Enzymes No results for input(s): CKTOTAL, CKMB, CKMBINDEX, TROPONINI in the last 72 hours.  BNP: BNP (last 3 results) Recent Labs    05/27/20 0828 06/02/20 1832  BNP 2,213.1* 2,338.9*    ProBNP (last 3 results) Recent Labs    05/13/20 1550 05/31/20 0939  PROBNP 46,357* 48,555*     D-Dimer No results for input(s): DDIMER in the last 72 hours. Hemoglobin A1C No results for input(s): HGBA1C in the last 72 hours. Fasting Lipid  Panel Recent Labs    06/04/20 0324  CHOL 88  HDL 47  LDLCALC 28  TRIG 67  CHOLHDL 1.9   Thyroid Function Tests Recent Labs    06/04/20 1119 06/06/20 0315  TSH  --  0.146*  T3FREE 2.8  --     Other results:   Imaging    DG CHEST PORT 1 VIEW  Result Date: 06/05/2020 CLINICAL DATA:  Post code EXAM: PORTABLE CHEST 1 VIEW COMPARISON:  06/02/2020 FINDINGS: Shallow inspiration. Heart size is enlarged. There is mild vascular congestion with mild perihilar infiltration or edema. Consolidation and effusion in the right lung base similar to prior study. No pneumothorax or pneumomediastinum. Calcification of the aorta. IMPRESSION: Cardiac enlargement with mild vascular congestion and perihilar edema. Consolidation and effusion in the right lung base. Electronically Signed   By: Lucienne Capers M.D.   On: 06/05/2020 18:40      Medications:     Scheduled Medications:  acetaminophen  1,000 mg Oral TID   amiodarone  200 mg Oral BID   aspirin  81 mg Oral Daily   atorvastatin  80 mg Oral Daily   carvedilol  3.125 mg Oral BID WC   Chlorhexidine Gluconate Cloth  6 each Topical Daily   colestipol  1 g Oral BID   dapagliflozin propanediol  10 mg Oral Daily   insulin aspart  0-5 Units Subcutaneous QHS   insulin aspart  0-9 Units Subcutaneous TID WC   melatonin  3 mg Oral QHS   mesalamine  4.8 g Oral Q breakfast   ondansetron (ZOFRAN) IV  4 mg Intravenous Once   sodium chloride flush  3 mL Intravenous Q12H    Infusions:  sodium chloride     sodium chloride     cefTRIAXone (ROCEPHIN)  IV Stopped (06/05/20 2110)   heparin     norepinephrine (LEVOPHED) Adult infusion 4 mcg/min (06/06/20 0600)    PRN Medications: sodium chloride, acetaminophen, ondansetron (ZOFRAN) IV, oxyCODONE, sodium chloride flush   Assessment/Plan   1. Acute on chronic systolic CHF: Cardiolite in 11/21 with EF 26%, inferior fixed perfusion defect with no ischemia.  Echo in 12/21 with EF 35-40%, moderate LV dilation, mildly decreased RV function. Cause of cardiomyopathy uncertain but there is concern for ischemic cardiomyopathy.  Worsening CHF recently complicated by CKD stage IV. She has had NYHA class III symptoms.  She diuresed well initially but had VT arrest last night, Lasix stopped. This morning, mild volume overload with JVP 8-9 range but creatinine higher at 2.3.  - Hold Lasix today with higher creatinine, plan for RHC tomorrow to assess filling pressures and cardiac output.  Discussed with patient, she agrees to procedure.  - Think we can titrate off norepinephrine today (SBP generally in 90s prior to arrest, runs soft).  - Continue Coreg 3.125 mg bid if BP stabilizes.  - Continue dapagliflozin 10 mg daily.  - Will be limited in terms of HF meds we can use with CKD 4, history of hyperkalemia, and soft BP.   For now, no ARNI/ARB/digoxin/spironolactone.  I am leery about starting spironolactone given recent hyperkalemia.  - Ideally, would have coronary angiography to look for CAD as cause of cardiomyopathy and VT.  However, creatinine is up today to 2.3.  Will reassess tomorrow but probably will need to hold off until creatinine lower.  2. CKD: Stage IV.  Creatinine higher at 2.3 today.  Has had recent hyperkalemia, now improved.   - Holding Lasix today, as above.  -  RHC tomorrow to assess filling pressures, cardiac output.  3. Chronic atrial fibrillation: This appears permanent, x years.  Has biatrial severe enlargement and unlikely to hold NSR with DCCV attempt.  - Continue rate control.  - INR 1.9, cath Monday so hold warfarin and cover with heparin gtt.  4. Low TSH: Concern for hyperthyroidism.  Free T4 is high, but free T3 is normal.  Have resent thyroid indices tomorrow and thyroid stimulating antibody level.  5. CAD: NSTEMI with HS-TnI up to 7990 post-VT arrest and CPR.  As above, have been concerned for CAD as cause of cardiomyopathy.  ?Rise in troponin due to demand ischemia versus ACS => no ischemic pain (just chest wall tenderness post-CPR) and no ECG change (old ASMI as in past).   - Cover with heparin gtt while INR low.  - Increase atorvastatin to 80 mg daily.  - Add ASA 81 daily.  - Ideally, would do coronary angiography but think we need improvement in creatinine for that. Probably just RHC tomorrow.  - Will probably get cardiac MRI to look for coronary-pattern scarring (versus some form of infiltrative disease).  6. Cardiac arrest: Pulseless VT.  Electrolytes stable. No chest pain or dyspnea prior.  ?Cause => suspect scar-mediated.  - Can stop amiodarone gtt and start amiodarone 200 mg po bid.  - Will need coronary angiography versus cardiac MRI depending on creatinine trend.  - Will need EP to evaluate for ICD.  7. Pulmonary: RLL PNA post-arrest by CXR.  Suspect aspiration. Afebrile.   - She is now on ceftriaxone.  Mobilize out of bed  CRITICAL CARE Performed by: Loralie Champagne  Total critical care time: 35 minutes  Critical care time was exclusive of separately billable procedures and treating other patients.  Critical care was necessary to treat or prevent imminent or life-threatening deterioration.  Critical care was time spent personally by me on the following activities: development of treatment plan with patient and/or surrogate as well as nursing, discussions with consultants, evaluation of patient's response to treatment, examination of patient, obtaining history from patient or surrogate, ordering and performing treatments and interventions, ordering and review of laboratory studies, ordering and review of radiographic studies, pulse oximetry and re-evaluation of patient's condition.  Length of Stay: 4  Loralie Champagne, MD  06/06/2020, 8:15 AM  Advanced Heart Failure Team Pager [phone] (M-F; 7a - 4p)  Please contact [facility] for night-coverage after hours (4p -7a ) and weekends on [email or web address]

## 2020-06-06 NOTE — Progress Notes (Signed)
Patients skin tare on right upper arm found to be bleeding significantly through Aquacel foam. Skin tare redressed and covered with gauze and pressure dressing.

## 2020-06-06 NOTE — Progress Notes (Signed)
[place] for heparin/warfarin Indication: atrial fibrillation  Allergies  Allergen Reactions  . Avandia [Rosiglitazone] Other (See Comments)    Causes muscle weakness.  . Latex Rash  . Sulfa Antibiotics Rash    Patient Measurements: Weight: 80.7 kg (177 lb 14.6 oz) Heparin Dosing Weight: 79.3 kg  Vital Signs: Temp: 97.6 F (36.4 C) (01/02 0655) Temp Source: Oral (01/02 0655) BP: 103/57 (01/02 0600) Pulse Rate: 63 (01/02 0605)  Labs: Recent Labs    06/04/20 0324 06/05/20 0159 06/05/20 1740 06/05/20 2020 06/05/20 2020 06/05/20 2238 06/06/20 0315 06/06/20 0606  HGB  --   --   --   --   --   --  10.3*  --   HCT  --   --   --   --   --   --  31.8*  --   PLT  --   --   --   --   --   --  297  --   LABPROT 23.7* 21.1*  --   --   --   --  21.1*  --   INR 2.2* 1.9*  --   --   --   --  1.9*  --   HEPARINUNFRC  --   --  0.98*  --   --   --   --  1.08*  CREATININE 2.03* 2.05*  --  2.10*  --   --  2.36*  --   TROPONINIHS  --   --   --  1,317*   < > 4,017* 6,872* 7,990*   < > = values in this interval not displayed.    Estimated Creatinine Clearance: 22.3 mL/min (A) (by C-G formula based on SCr of 2.36 mg/dL (H)).   Medical History: Past Medical History:  Diagnosis Date  . A-fib ([place])   . Acute on chronic combined systolic (congestive) and diastolic (congestive) heart failure ([place])   . Carotid artery occlusion   . DCM (dilated cardiomyopathy) ([place])    EF 30-35% by echo 05/2020  . Diabetes mellitus   . Hyperlipidemia   . Irregular heart beat   . Vitamin D deficiency      Assessment: [name] on warfarin for afib PTA now holding warfarin for [facility]. Pharmacy consulted to start heparin when INR < 2.5, now wanting <2. Admit INR 2.8> down to 1.9.   Last Coumadin dose taken 12/28.  Warfarin PTA dose: 1.5mg  Fridays, 1mg  all other days (Last seen 05/26/20 by [facility]).   Heparin level up to 1.08 this AM despite rate reduction last night.   Level drawn peripherally.  Per RN report, some significant bleeding from skin tear on arm.  Hgb low but stable.  Goal of Therapy:  Heparin level 0.3-0.7 units/ml Monitor platelets by anticoagulation protocol: Yes   Plan:  Hold IV heparin x 1 hr. Then resume heparin gtt at 800 units/hr. Check heparin level 8 hrs after gtt resumed. Monitor daily INR, heparin level, CBC  [place] Crane, Roylene Reason, [facility] Clinical Pharmacist  06/06/2020 8:17 AM   [facility] pharmacy phone numbers are listed on [email or web address]

## 2020-06-07 ENCOUNTER — Inpatient Hospital Stay (HOSPITAL_COMMUNITY): Payer: PPO

## 2020-06-07 ENCOUNTER — Encounter (HOSPITAL_COMMUNITY)
Admission: AD | Disposition: A | Discharge status: Rehabilitation Facility | Payer: Self-pay | Source: Ambulatory Visit | Attending: Cardiology

## 2020-06-07 ENCOUNTER — Encounter (HOSPITAL_COMMUNITY): Payer: Self-pay | Admitting: Cardiology

## 2020-06-07 DIAGNOSIS — I482 Chronic atrial fibrillation, unspecified: Secondary | ICD-10-CM | POA: Diagnosis not present

## 2020-06-07 DIAGNOSIS — I5023 Acute on chronic systolic (congestive) heart failure: Secondary | ICD-10-CM

## 2020-06-07 DIAGNOSIS — I429 Cardiomyopathy, unspecified: Secondary | ICD-10-CM

## 2020-06-07 DIAGNOSIS — I472 Ventricular tachycardia: Secondary | ICD-10-CM | POA: Diagnosis not present

## 2020-06-07 DIAGNOSIS — I5021 Acute systolic (congestive) heart failure: Secondary | ICD-10-CM | POA: Diagnosis not present

## 2020-06-07 DIAGNOSIS — N184 Chronic kidney disease, stage 4 (severe): Secondary | ICD-10-CM | POA: Diagnosis not present

## 2020-06-07 HISTORY — PX: RIGHT HEART CATH: CATH118263

## 2020-06-07 LAB — POCT I-STAT EG7
Acid-base deficit: 3 mmol/L — ABNORMAL HIGH (ref 0.0–2.0)
Acid-base deficit: 3 mmol/L — ABNORMAL HIGH (ref 0.0–2.0)
Bicarbonate: 23.8 mmol/L (ref 20.0–28.0)
Bicarbonate: 24 mmol/L (ref 20.0–28.0)
Calcium, Ion: 1.22 mmol/L (ref 1.15–1.40)
Calcium, Ion: 1.23 mmol/L (ref 1.15–1.40)
HCT: 31 % — ABNORMAL LOW (ref 36.0–46.0)
HCT: 31 % — ABNORMAL LOW (ref 36.0–46.0)
Hemoglobin: 10.5 g/dL — ABNORMAL LOW (ref 12.0–15.0)
Hemoglobin: 10.5 g/dL — ABNORMAL LOW (ref 12.0–15.0)
O2 Saturation: 53 %
O2 Saturation: 56 %
Potassium: 4.1 mmol/L (ref 3.5–5.1)
Potassium: 4.1 mmol/L (ref 3.5–5.1)
Sodium: 138 mmol/L (ref 135–145)
Sodium: 138 mmol/L (ref 135–145)
TCO2: 25 mmol/L (ref 22–32)
TCO2: 26 mmol/L (ref 22–32)
pCO2, Ven: 50.8 mmHg (ref 44.0–60.0)
pCO2, Ven: 51.5 mmHg (ref 44.0–60.0)
pH, Ven: 7.273 (ref 7.250–7.430)
pH, Ven: 7.282 (ref 7.250–7.430)
pO2, Ven: 32 mmHg (ref 32.0–45.0)
pO2, Ven: 33 mmHg (ref 32.0–45.0)

## 2020-06-07 LAB — CBC
HCT: 30.7 % — ABNORMAL LOW (ref 36.0–46.0)
HCT: 30.4 % — ABNORMAL LOW (ref 36.0–46.0)
Hemoglobin: 9.4 g/dL — ABNORMAL LOW (ref 12.0–15.0)
Hemoglobin: 9.7 g/dL — ABNORMAL LOW (ref 12.0–15.0)
MCH: 29.1 pg (ref 26.0–34.0)
MCH: 30.1 pg (ref 26.0–34.0)
MCHC: 30.6 g/dL (ref 30.0–36.0)
MCHC: 31.9 g/dL (ref 30.0–36.0)
MCV: 95 fL (ref 80.0–100.0)
MCV: 94.4 fL (ref 80.0–100.0)
Platelets: 255 10*3/uL (ref 150–400)
Platelets: 263 10*3/uL (ref 150–400)
RBC: 3.23 MIL/uL — ABNORMAL LOW (ref 3.87–5.11)
RBC: 3.22 MIL/uL — ABNORMAL LOW (ref 3.87–5.11)
RDW: 14.2 % (ref 11.5–15.5)
RDW: 14.3 % (ref 11.5–15.5)
WBC: 9.5 10*3/uL (ref 4.0–10.5)
WBC: 10 10*3/uL (ref 4.0–10.5)
nRBC: 0 % (ref 0.0–0.2)
nRBC: 0 % (ref 0.0–0.2)

## 2020-06-07 LAB — MAGNESIUM: Magnesium: 1.9 mg/dL (ref 1.7–2.4)

## 2020-06-07 LAB — BASIC METABOLIC PANEL
Anion gap: 15 (ref 5–15)
Anion gap: 13 (ref 5–15)
BUN: 55 mg/dL — ABNORMAL HIGH (ref 8–23)
BUN: 53 mg/dL — ABNORMAL HIGH (ref 8–23)
CO2: 22 mmol/L (ref 22–32)
CO2: 21 mmol/L — ABNORMAL LOW (ref 22–32)
Calcium: 8.5 mg/dL — ABNORMAL LOW (ref 8.9–10.3)
Calcium: 8.7 mg/dL — ABNORMAL LOW (ref 8.9–10.3)
Chloride: 100 mmol/L (ref 98–111)
Chloride: 102 mmol/L (ref 98–111)
Creatinine, Ser: 2.35 mg/dL — ABNORMAL HIGH (ref 0.44–1.00)
Creatinine, Ser: 2.34 mg/dL — ABNORMAL HIGH (ref 0.44–1.00)
GFR, Estimated: 21 mL/min — ABNORMAL LOW (ref >60)
GFR, Estimated: 21 mL/min — ABNORMAL LOW (ref >60)
Glucose, Bld: 136 mg/dL — ABNORMAL HIGH (ref 70–99)
Glucose, Bld: 120 mg/dL — ABNORMAL HIGH (ref 70–99)
Potassium: 3.9 mmol/L (ref 3.5–5.1)
Potassium: 4 mmol/L (ref 3.5–5.1)
Sodium: 137 mmol/L (ref 135–145)
Sodium: 136 mmol/L (ref 135–145)

## 2020-06-07 LAB — PROTIME-INR
INR: 1.8 — ABNORMAL HIGH (ref 0.8–1.2)
INR: 1.8 — ABNORMAL HIGH (ref 0.8–1.2)
Prothrombin Time: 20.4 seconds — ABNORMAL HIGH (ref 11.4–15.2)
Prothrombin Time: 20.6 seconds — ABNORMAL HIGH (ref 11.4–15.2)

## 2020-06-07 LAB — HEPARIN LEVEL (UNFRACTIONATED): Heparin Unfractionated: 0.33 IU/mL (ref 0.30–0.70)

## 2020-06-07 LAB — GLUCOSE, CAPILLARY
Glucose-Capillary: 113 mg/dL — ABNORMAL HIGH (ref 70–99)
Glucose-Capillary: 152 mg/dL — ABNORMAL HIGH (ref 70–99)
Glucose-Capillary: 112 mg/dL — ABNORMAL HIGH (ref 70–99)
Glucose-Capillary: 150 mg/dL — ABNORMAL HIGH (ref 70–99)

## 2020-06-07 LAB — T3, FREE: T3, Free: 2.4 pg/mL (ref 2.0–4.4)

## 2020-06-07 SURGERY — RIGHT HEART CATH
Anesthesia: LOCAL

## 2020-06-07 MED ORDER — GADOBUTROL 1 MMOL/ML IV SOLN
10.0000 mL | Freq: Once | INTRAVENOUS | Status: AC | PRN
  Administered 2020-06-07: 10 mL via INTRAVENOUS

## 2020-06-07 MED ORDER — WARFARIN SODIUM 2 MG PO TABS
2.0000 mg | ORAL_TABLET | Freq: Once | ORAL | Status: AC
  Administered 2020-06-07: 2 mg via ORAL
  Filled 2020-06-07: qty 1

## 2020-06-07 MED ORDER — LIDOCAINE HCL (PF) 1 % IJ SOLN
INTRAMUSCULAR | Status: DC | PRN
  Administered 2020-06-07: 2 mL

## 2020-06-07 MED ORDER — FUROSEMIDE 10 MG/ML IJ SOLN
60.0000 mg | Freq: Once | INTRAMUSCULAR | Status: AC
  Administered 2020-06-07: 60 mg via INTRAVENOUS
  Filled 2020-06-07: qty 6

## 2020-06-07 MED ORDER — HEPARIN (PORCINE) IN NACL 1000-0.9 UT/500ML-% IV SOLN
INTRAVENOUS | Status: AC
  Filled 2020-06-07: qty 500

## 2020-06-07 MED ORDER — METHIMAZOLE 5 MG PO TABS
5.0000 mg | ORAL_TABLET | Freq: Every day | ORAL | Status: DC
  Administered 2020-06-07 – 2020-06-16 (×9): 5 mg via ORAL
  Filled 2020-06-07 (×10): qty 1

## 2020-06-07 MED ORDER — WARFARIN - PHARMACIST DOSING INPATIENT: Freq: Every day | Status: DC

## 2020-06-07 MED ORDER — MIDAZOLAM HCL 2 MG/2ML IJ SOLN
INTRAMUSCULAR | Status: DC | PRN
  Administered 2020-06-07: 0.5 mg via INTRAVENOUS

## 2020-06-07 MED ORDER — HEPARIN (PORCINE) IN NACL 1000-0.9 UT/500ML-% IV SOLN
INTRAVENOUS | Status: DC | PRN
  Administered 2020-06-07: 500 mL

## 2020-06-07 MED ORDER — LIDOCAINE HCL (PF) 1 % IJ SOLN
INTRAMUSCULAR | Status: AC
  Filled 2020-06-07: qty 30

## 2020-06-07 MED ORDER — MIDAZOLAM HCL 2 MG/2ML IJ SOLN
INTRAMUSCULAR | Status: AC
  Filled 2020-06-07: qty 2

## 2020-06-07 MED ORDER — FENTANYL CITRATE (PF) 100 MCG/2ML IJ SOLN
INTRAMUSCULAR | Status: DC | PRN
  Administered 2020-06-07: 12.5 ug via INTRAVENOUS

## 2020-06-07 MED ORDER — FENTANYL CITRATE (PF) 100 MCG/2ML IJ SOLN
INTRAMUSCULAR | Status: AC
  Filled 2020-06-07: qty 2

## 2020-06-07 SURGICAL SUPPLY — 10 items
CATH BALLN WEDGE 5F 110CM (CATHETERS) ×2 IMPLANT
GUIDEWIRE .025 260CM (WIRE) ×2 IMPLANT
PACK CARDIAC CATHETERIZATION (CUSTOM PROCEDURE TRAY) ×2 IMPLANT
PROTECTION STATION PRESSURIZED (MISCELLANEOUS) ×2
SHEATH GLIDE SLENDER 4/5FR (SHEATH) ×2 IMPLANT
STATION PROTECTION PRESSURIZED (MISCELLANEOUS) ×1 IMPLANT
TRANSDUCER W/STOPCOCK (MISCELLANEOUS) ×2 IMPLANT
TUBING ART PRESS 72  MALE/FEM (TUBING) ×2
TUBING ART PRESS 72 MALE/FEM (TUBING) ×1 IMPLANT
WIRE EMERALD 3MM-J .025X260CM (WIRE) IMPLANT

## 2020-06-07 NOTE — Progress Notes (Signed)
PT Cancellation Note  Patient Details Name: Cynthia Hansen MRN: [id number] DOB: 06-12-1940   Cancelled Treatment:    Reason Eval/Treat Not Completed: Medical issues which prohibited therapy (Going for Cardiac MRI. Will return tomorrow.)   Denice Paradise 06/07/2020, 2:48 PM Meer Reindl W,PT Acute Rehabilitation Services Pager:  [phone]  Office:  [phone]

## 2020-06-07 NOTE — Progress Notes (Signed)
[place] for Heparin  Indication: atrial fibrillation  Allergies  Allergen Reactions  . Avandia [Rosiglitazone] Other (See Comments)    Causes muscle weakness.  . Latex Rash  . Sulfa Antibiotics Rash    Patient Measurements: Height: 5\' 9"  (175.3 cm) Weight: 79.5 kg (175 lb 4.3 oz) IBW/kg (Calculated) : 66.2 Heparin Dosing Weight: 79.3 kg  Vital Signs: Temp: 97.7 F (36.5 C) (01/03 1230) Temp Source: Axillary (01/03 0400) BP: 108/53 (01/03 1300) Pulse Rate: 66 (01/03 1300)  Labs: Recent Labs    06/05/20 0159 06/05/20 2238 06/06/20 0315 06/06/20 0606 06/06/20 1650 06/06/20 2252 06/07/20 0400 06/07/20 0611  HGB   < >  --  10.3*  --   --  9.4*  --  9.7*  HCT  --   --  31.8*  --   --  30.7*  --  30.4*  PLT  --   --  297  --   --  255  --  263  LABPROT  --   --  21.1*  --   --  20.4*  --  20.6*  INR  --   --  1.9*  --   --  1.8*  --  1.8*  HEPARINUNFRC  --   --   --  1.08* 0.42 0.35 0.33  --   CREATININE  --   --  2.36*  --   --  2.35*  --  2.34*  TROPONINIHS  --  4,017* 7,824* 7,990*  --   --   --   --    < > = values in this interval not displayed.    Estimated Creatinine Clearance: 22.4 mL/min (A) (by C-G formula based on SCr of 2.34 mg/dL (H)).   Medical History: Past Medical History:  Diagnosis Date  . A-fib ([place])   . Acute on chronic combined systolic (congestive) and diastolic (congestive) heart failure ([place])   . Carotid artery occlusion   . DCM (dilated cardiomyopathy) ([place])    EF 30-35% by echo 05/2020  . Diabetes mellitus   . Hyperlipidemia   . Irregular heart beat   . Vitamin D deficiency      Assessment: [name] on warfarin for afib PTA now holding warfarin for [facility]. Pharmacy consulted to start heparin when INR <2. Admit INR 2.8> down to 1.8.   Last Coumadin dose taken 12/28. Heparin drip 800 uts/hr HL at goal 0.33 Heparin stopped post cath today Will resume warfarin   Warfarin PTA dose: 1.5mg  Fridays,  1mg  all other days (Last seen 05/26/20 by [facility]).    Goal of Therapy:  Heparin level 0.3-0.7 units/ml Monitor platelets by anticoagulation protocol: Yes   Plan:  -Warfarin 2mg  x1 -Daily CBC/Protime -Monitor for bleeding    Bonnita Nasuti Pharm.D. CPP, BCPS Clinical Pharmacist [phone] 06/07/2020 3:07 PM

## 2020-06-07 NOTE — Consult Note (Addendum)
Cardiology Consultation:   Patient ID: Cynthia Hansen MRN: [id number]; DOB: Aug 25, 1940  Admit date: 06/02/2020 Date of Consult: 06/07/2020  Primary Care Provider: Aretta Nip, MD [facility] Cardiologist: Larae Grooms, MD  [facility] Electrophysiologist:  None    Patient Profile:   Cynthia Hansen is a 80 y.o. female with a hx of HLD, DM, + smoker quit 2017, PVD (50-69% RICA and patent Left carotid endarterectomy 5/19/63m follow w/Dr. Oneida Alar), AFib (permanent), DCM, CKD (IV) who is being seen today for the evaluation of VT arrest at the request of Dr. Aundra Dubin.  History of Present Illness:   [name] saw Dr. Irish Lack recently, after the death of her husband in 2023-05-13 with c/o L arm aching, that she suspected with musculoskeletal had started months prior, she was sent for stress testing 05/04/20 noting scar, no ischemia with LVEF 26%. She was recently felt to be volume OL, worsening functional status and DOE, and her diuretics adjusted out patient.  She was seen in the office 06/02/20 2/2 this and f/u on her volume, seen by Dr. Ashok Norris who recommended admission for further management of her HF. HF team consulted IV lasix initiated, dapagliflozin 10 mg daily.  Noted that her HF treatment wil be limited with softer BPs and renal disease TSH is low and free T4 is high with normal free T3 with plans to repeat F/u remained abn w/TSH 0.146, T3 2.4 (wnl) FreeT4 1.37 (mildly elevated)  06/05/20 she arrested with pulseless VT, had about 4 minutes of CPR and was shocked for VT back to NSR.  Started on amiodarone gtt, and norepi with hypotension , she did not require intubation, started on abx to cover for for aspiration, felt to have NSTEMI with HS-TnI up to 7990 post-VT arrest and CPR, unclear if rise in Trop 2/2 demand with arrest, she had no ischemic sounding symptoms or EKG changes reported 06/05/20 LABS 3.9 > 3.8  BUN/Creat 53/12.05 > 57/2.10   Mag 1.9 HS Trop 1317 > 4017 >  6872 > 7990   Planned for RHC and c/MRI to look for coronary-pattern scarring (versus some form of infiltrative disease). Team being hesitant to do [place] given her kidney disease. RHC Procedural Findings: (today) Hemodynamics (mmHg) RA mean 9 RV 39/9 PA 42/20, mean 29 PCWP mean 19  Oxygen saturations: PA 55% AO 99%  Cardiac Output (Fick) 4.65  Cardiac Index (Fick) 2.36 PVR 2.15 WU  LABS today K+ 4.0 BUN/Creat 53/2.34 WBC 10 H?H 9.7/30.4 Plts 263  EP is asked to see for consideration of ICD  She has not had any CP, she has had  In the last 8 weeks an acute change in her functional status with rapidly progressing SOB, DOE, and LE swelling. Sje was previously very independent and active, no exercising but active to being unable to ambulate 10 feet or so without feeling SOB and needing to use a wheelchair.  With the diuretics adjustments she was getting some improvement though mentions at one point at home she thought she was dying she felt so terrible and weak.   Past Medical History:  Diagnosis Date  . A-fib ([place])   . Acute on chronic combined systolic (congestive) and diastolic (congestive) heart failure ([place])   . Carotid artery occlusion   . DCM (dilated cardiomyopathy) ([place])    EF 30-35% by echo 05/2020  . Diabetes mellitus   . Hyperlipidemia   . Irregular heart beat   . Vitamin D deficiency     Past  Surgical History:  Procedure Laterality Date  . ABDOMINAL HERNIA REPAIR    . CAROTID ENDARTERECTOMY Left Dec. 3, 2008    cea  . CATARACT EXTRACTION Left   . HYSTEROSCOPY WITH D & C N/A 07/30/2014   Procedure: DILATATION AND CURETTAGE /HYSTEROSCOPY;  Surgeon: Osborne Oman, MD;  Location: [facility];  Service: Gynecology;  Laterality: N/A;  . IUD REMOVAL N/A 07/30/2014   Procedure: INTRAUTERINE DEVICE (IUD) REMOVAL;  Surgeon: Osborne Oman, MD;  Location: [facility];  Service: Gynecology;  Laterality: N/A;     Home Medications:  Prior to Admission medications    Medication Sig Start Date End Date Taking? Authorizing Provider  acetaminophen (TYLENOL) 500 MG tablet Take 1,000 mg by mouth every 6 (six) hours as needed for mild pain (For back pain.).   Yes [provider]  atorvastatin (LIPITOR) 20 MG tablet TAKE 1 TABLET(20 MG) BY MOUTH DAILY Patient taking differently: Take 20 mg by mouth daily. 01/27/20  Yes Jettie Booze, MD  colestipol (COLESTID) 1 g tablet Take 1 g by mouth 2 (two) times daily.   Yes [provider]  diltiazem (CARDIZEM CD) 180 MG 24 hr capsule TAKE 1 CAPSULE BY MOUTH TWICE DAILY BEFORE MEALS Patient taking differently: Take 180 mg by mouth in the morning and at bedtime. 02/10/20  Yes Jettie Booze, MD  diphenoxylate-atropine (LOMOTIL) 2.5-0.025 MG tablet Take 1 tablet by mouth 2 (two) times daily as needed. 12/18/19  Yes [provider]  furosemide (LASIX) 40 MG tablet Take 1 tablet (40 mg total) by mouth 2 (two) times daily. 05/19/20  Yes Jettie Booze, MD  glimepiride (AMARYL) 2 MG tablet Take 2 mg by mouth daily with breakfast. 03/25/18  Yes [provider]  Liniments (SALONPAS ARTHRITIS PAIN RELIEF EX) Apply 1 application topically daily as needed (For lower back pain).   Yes [provider]  lisinopril (PRINIVIL,ZESTRIL) 5 MG tablet Take 2.5 mg by mouth daily. 07/13/12  Yes [provider]  Vitamin D, Ergocalciferol, (DRISDOL) 1.25 MG (50000 UNIT) CAPS capsule Take 50,000 Units by mouth once a week. 07/31/19  Yes [provider]  warfarin (COUMADIN) 1 MG tablet Take 1 tablet to 1 and 1/2 tablets by mouth daily as directed by the coumadin clinic. Patient taking differently: Take 1-1.5 mg by mouth See admin instructions. Take 1 tablet by mouth every day except on Tuesdays take  1 and 1/2 tablets by mouth daily as directed by the coumadin clinic. 05/26/20  Yes Jettie Booze, MD  ONE TOUCH ULTRA TEST test strip 1 each by Other route as needed (for BG  testing).  03/22/14   [provider]    Inpatient Medications: Scheduled Meds: . acetaminophen  1,000 mg Oral TID  . amiodarone  200 mg Oral BID  . aspirin  81 mg Oral Daily  . atorvastatin  80 mg Oral Daily  . carvedilol  3.125 mg Oral BID WC  . Chlorhexidine Gluconate Cloth  6 each Topical Daily  . colestipol  1 g Oral BID  . dapagliflozin propanediol  10 mg Oral Daily  . insulin aspart  0-5 Units Subcutaneous QHS  . insulin aspart  0-9 Units Subcutaneous TID WC  . melatonin  3 mg Oral QHS  . mesalamine  4.8 g Oral Q breakfast  . methimazole  5 mg Oral Daily  . ondansetron (ZOFRAN) IV  4 mg Intravenous Once  . sodium chloride flush  3 mL Intravenous Q12H  . sodium  chloride flush  3 mL Intravenous Q12H   Continuous Infusions: . sodium chloride    . sodium chloride    . cefTRIAXone (ROCEPHIN)  IV Stopped (06/06/20 2217)  . heparin Stopped (06/07/20 0741)  . norepinephrine (LEVOPHED) Adult infusion Stopped (06/06/20 1307)   PRN Meds: sodium chloride, acetaminophen, ondansetron (ZOFRAN) IV, oxyCODONE, sodium chloride flush  Allergies:    Allergies  Allergen Reactions  . Avandia [Rosiglitazone] Other (See Comments)    Causes muscle weakness.  . Latex Rash  . Sulfa Antibiotics Rash    Social History:   Social History   Socioeconomic History  . Marital status: Married    Spouse name: Not on file  . Number of children: Not on file  . Years of education: Not on file  . Highest education level: Not on file  Occupational History  . Not on file  Tobacco Use  . Smoking status: Former Smoker    Years: 50.00    Types: Cigarettes    Quit date: 10/05/2016    Years since quitting: 3.6  . Smokeless tobacco: Never Used  Vaping Use  . Vaping Use: Never used  Substance and Sexual Activity  . Alcohol use: Yes    Alcohol/week: 0.0 standard drinks  . Drug use: No  . Sexual activity: Not Currently  Other Topics Concern  . Not on file  Social History Narrative  .  Not on file   Social Determinants of Health   Financial Resource Strain: Not on file  Food Insecurity: Not on file  Transportation Needs: Not on file  Physical Activity: Not on file  Stress: Not on file  Social Connections: Not on file  Intimate Partner Violence: Not on file    Family History:   Family History  Problem Relation Age of Onset  . Cancer Mother        Uterin  . Heart attack Father   . Hypertension Sister      ROS:  Please see the history of present illness.  All other ROS reviewed and negative.     Physical Exam/Data:   Vitals:   06/07/20 0930 06/07/20 0945 06/07/20 1000 06/07/20 1015  BP: (!) 97/51 (!) 87/44 (!) 96/59 107/80  Pulse: 62 86 76 60  Resp: 15 14 17 15   Temp:      TempSrc:      SpO2: 97% 98% 100% 100%  Weight:      Height:        Intake/Output Summary (Last 24 hours) at 06/07/2020 1056 Last data filed at 06/07/2020 0900 Gross per 24 hour  Intake 351.9 ml  Output 1125 ml  Net -773.1 ml   Last 3 Weights 06/07/2020 06/06/2020 06/05/2020  Weight (lbs) 175 lb 4.3 oz 177 lb 14.6 oz 174 lb 12.8 oz  Weight (kg) 79.5 kg 80.7 kg 79.289 kg     Body mass index is 25.88 kg/m.  General:  Well nourished, well developed, in no acute distress HEENT: normal Lymph: no adenopathy Neck: no JVD Endocrine:  No thryomegaly Vascular: No carotid bruits Cardiac:  irreg-irreg; no murmurs, gallops or rubs Lungs: CTA b/ly, no wheezing, rhonchi or rales  Abd: soft, nontender  Ext: trace edema Musculoskeletal:  No deformities, though several bandages on her UE with reports of skin tears Skin: warm and dry  Neuro:  no gross focal abnormalities noted Psych:  Normal affect   EKG:  The EKG was personally reviewed and demonstrates:    05/27/20 AFib 72bpm, fairly diffuse ST/T changes (appear  different from July) (in environment of K+ 5.8) 06/05/20 AFib 113bpm, RBBB, no clear ST changes 06/06/20 AFib 60bpm, no ST changes   Telemetry:  Telemetry was personally reviewed  and demonstrates:   AFib 80's, occ PVCs  Relevant CV Studies:  CARDIAC CATHETERIZATION Result Date: 06/07/2020 1. Mildly elevated filling pressures 2. Pulmonary venous hypertension (mild) 3. Relatively preserved cardiac output  05/04/20: lexiscan stress myoview Myocardial perfusion is abnormal.  Findings consistent with prior myocardial infarction.  This is a high risk study.  Overall left ventricular systolic function was abnormal.    LV cavity size is normal.  Nuclear stress EF:  26%.  The left ventricular ejection fraction is severely decreased (<30%).   that was performed on 5/2/2007Compared to the prior study, there are changes. Defect 1: There is a medium defect of severe severity present in the basal inferior, mid inferior, apical inferior and apical lateral location. The defect is non-reversible.  Defect 2: There is a small defect of moderate severity present in the mid inferoseptal, apical septal and apex location. The defect is non-reversible.   03/14/2020: TTE IMPRESSIONS  1. Left ventricular ejection fraction, by estimation, is 35 to 40%. The  left ventricle has moderately decreased function. Left ventricular  endocardial border not optimally defined to evaluate regional wall motion.  The left ventricular internal cavity  size was moderately dilated. Left ventricular diastolic function could not  be evaluated.  2. Right ventricular systolic function is mildly reduced. The right  ventricular size is normal. There is mildly elevated pulmonary artery  systolic pressure. The estimated right ventricular systolic pressure is  82.9 mmHg.  3. Left atrial size was moderately dilated.  4. Right atrial size was severely dilated.  5. The mitral valve is normal in structure. Mild to moderate mitral valve  regurgitation. No evidence of mitral stenosis. Moderate mitral annular  calcification.  6. Tricuspid valve regurgitation is moderate.  7. The aortic valve is normal in  structure. Aortic valve regurgitation is  not visualized. No aortic stenosis is present.  8. The inferior vena cava is normal in size with greater than 50%  respiratory variability, suggesting right atrial pressure of 3  mmHg.Consider limited study with definity contrast to better evsaluate for  wall motion abnormalities    03/23/2006: TTE SUMMARY  - Overall left ventricular systolic function was normal.  - Mildly thickened trileaflet aortic valve.  - There was mild atheroma of the aortic arch. There was mild     atheroma of the descending aorta.  - There was mild mitral valvular regurgitation.  - The left atrium was dilated. There was no left atrial appendage     thrombus identified.    Laboratory Data:  High Sensitivity Troponin:   Recent Labs  Lab 06/05/20 2020 06/05/20 2238 06/06/20 0315 06/06/20 0606  TROPONINIHS 1,317* 4,017* 6,872* 7,990*     Chemistry Recent Labs  Lab 06/06/20 0315 06/06/20 2252 06/07/20 0611  NA 138 137 136  K 5.0 3.9 4.0  CL 103 100 102  CO2 19* 22 21*  GLUCOSE 175* 136* 120*  BUN 59* 55* 53*  CREATININE 2.36* 2.35* 2.34*  CALCIUM 8.7* 8.5* 8.7*  GFRNONAA 21* 21* 21*  ANIONGAP 16* 15 13    Recent Labs  Lab 06/02/20 1832  PROT 6.6  ALBUMIN 3.8  AST 30  ALT 21  ALKPHOS 66  BILITOT 0.9   Hematology Recent Labs  Lab 06/06/20 0315 06/06/20 2252 06/07/20 0611  WBC 13.5* 9.5 10.0  RBC 3.40*  3.23* 3.22*  HGB 10.3* 9.4* 9.7*  HCT 31.8* 30.7* 30.4*  MCV 93.5 95.0 94.4  MCH 30.3 29.1 30.1  MCHC 32.4 30.6 31.9  RDW 14.3 14.2 14.3  PLT 297 255 263   BNP Recent Labs  Lab 06/02/20 1832  BNP 2,338.9*    DDimer No results for input(s): DDIMER in the last 168 hours.   Radiology/Studies:     DG CHEST PORT 1 VIEW Result Date: 06/05/2020 CLINICAL DATA:  Post code EXAM: PORTABLE CHEST 1 VIEW COMPARISON:  06/02/2020 FINDINGS: Shallow inspiration. Heart size is enlarged. There is mild vascular congestion with  mild perihilar infiltration or edema. Consolidation and effusion in the right lung base similar to prior study. No pneumothorax or pneumomediastinum. Calcification of the aorta. IMPRESSION: Cardiac enlargement with mild vascular congestion and perihilar edema. Consolidation and effusion in the right lung base. Electronically Signed   By: Lucienne Capers M.D.   On: 06/05/2020 18:40     Assessment and Plan:   1. VT 2. NSTEMI     MMVT 06/06/19     No notable electrolyte derangement     She has been transitioned to PO amiodarone (yesterday)  Ideally would get LHC, though renal disease felt not to allow  C.MRI will be helpful CM is newly found Not sure to what extent she will be able to get to GDMT  I have d/w Dr. Quentin Ore who has seen and examined the patient Historically not on BB but was on ACE Now on BB, no ACE/ARB/ARNI  ? If there is a role for life vest to get her to the best GDMT she can tolerate and revisit EF ? follow renal function post diuresis and see if able to get cath .Vs. ICD implant Dr. Quentin Ore in review of her VT feels like it was most likely scar mediated and recommends ICD implant He has discussed with the patient and her daughter recommendation for ICD  Implant, the procedure, potential risks and benefits.  Recommend mobilizing her, get towards/closer to discharge, and plan implant prior to d/c Perhaps Friday  The patient was made aware of [place] law, no driving 80mo     2. CM     Suspected to be  NICM given no reversible defects on her stress test      No current plans for LHC 2/2 renal disease     She does have known PVD and was a ong standing smoker with 2 fixed defects on her stress test Nov 2021     Pending c.MRI   3. Acute/chronic CHF    RHC as above     Holding lasix today    C/w HF team  4. AKI on CKD (IV)     (March 2021 1.9 >> Dec 9th 2.98 > Dec 22 was  2.26)     Today 2.34  5. Post arrest pneumonia     On abx      WBC normalized      Afebrile  6. Permanent AFib     CHA2DS2Vasc is 6, outpt was on warfarin     heparin gtt here > held today for [place] from EP to resume warfarin     For questions or updates, please contact [place] Please consult [email or web address] for contact info under    Signed, Baldwin [place], PA-C  06/07/2020 10:56 AM

## 2020-06-07 NOTE — Progress Notes (Signed)
CARDIAC REHAB PHASE I   Offered to walk with pt. Pt states she is fatigued today and going for a test soon. Deferring walk until after test. Pt helped to [facility], after returning to bed, pt c/o nausea. RN made aware. Will continue to follow and encourage ambulation as able.  [id number] Rufina Falco, RN BSN 06/07/2020 2:49 PM

## 2020-06-07 NOTE — Interval H&P Note (Signed)
History and Physical Interval Note:  06/07/2020 8:07 AM  Cynthia Hansen  has presented today for surgery, with the diagnosis of NSTEMI.  The various methods of treatment have been discussed with the patient and family. After consideration of risks, benefits and other options for treatment, the patient has consented to  Procedure(s): RIGHT HEART CATH (N/A) as a surgical intervention.  The patient's history has been reviewed, patient examined, no change in status, stable for surgery.  I have reviewed the patient's chart and labs.  Questions were answered to the patient's satisfaction.     Margree Gimbel [employer]

## 2020-06-07 NOTE — Progress Notes (Addendum)
Patient ID: Cynthia Hansen, female   DOB: 1941/01/28, 80 y.o.   MRN: [id number]     Advanced Heart Failure Rounding Note  PCP-Cardiologist: Larae Grooms, MD   Subjective:    06/05/20  patient had pulseless VT requiring shock x 1 and CPR x 4 minutes.  She had rapid recovery and was not intubated.  Electrolytes stable.  Amiodarone gtt started.  Due to low BP overnight, she was started on norepinephrine 4 peripherally.  Creatinine rose 2.1 => 2.36 and HS-TnI rose to 7990.  Currently, only mild chest wall tenderness post-CPR.    CXR post-arrest with RLL infiltrate thought to be aspiration, now on ceftriaxone. She is now down to 2L by [place].   ECG today with unchanged atrial fibrillation and old ASMI. QTc 426.   Of note, TSH is low and free T4 is high with normal free T3.   Complaining of pain on left side due to CPR.    Objective:   Weight Range: 79.5 kg Body mass index is 25.88 kg/m.   Vital Signs:   Temp:  [96.4 F (35.8 C)-98.4 F (36.9 C)] 96.8 F (36 C) (01/03 0400) Pulse Rate:  [53-94] 58 (01/03 0700) Resp:  [12-30] 16 (01/03 0700) BP: (82-120)/(39-95) 92/80 (01/03 0700) SpO2:  [92 %-100 %] 99 % (01/03 0700) Weight:  [79.5 kg] 79.5 kg (01/03 0500) Last BM Date: 06/05/20  Weight change: Filed Weights   06/05/20 0011 06/06/20 0459 06/07/20 0500  Weight: 79.3 kg 80.7 kg 79.5 kg    Intake/Output:   Intake/Output Summary (Last 24 hours) at 06/07/2020 0723 Last data filed at 06/07/2020 0600 Gross per 24 hour  Intake 430.46 ml  Output 1125 ml  Net -694.54 ml      Physical Exam   General:   No resp difficulty HEENT: normal Neck: supple. no JVD. Carotids 2+ bilat; no bruits. No lymphadenopathy or thryomegaly appreciated. Cor: PMI nondisplaced. Irregular rate & rhythm. No rubs, gallops or murmurs. Lungs: clear Abdomen: soft, nontender, nondistended. No hepatosplenomegaly. No bruits or masses. Good bowel sounds. Extremities: no cyanosis, clubbing, rash, edema. Skin  tears RUE/LUE Neuro: alert & orientedx3, cranial nerves grossly intact. moves all 4 extremities w/o difficulty. Affect pleasant   Telemetry    A fib. 70-80s    Labs    CBC Recent Labs    06/06/20 2252 06/07/20 0611  WBC 9.5 10.0  HGB 9.4* 9.7*  HCT 30.7* 30.4*  MCV 95.0 94.4  PLT 255 720   Basic Metabolic Panel Recent Labs    06/06/20 0315 06/06/20 2252 06/07/20 0611  NA 138 137 136  K 5.0 3.9 4.0  CL 103 100 102  CO2 19* 22 21*  GLUCOSE 175* 136* 120*  BUN 59* 55* 53*  CREATININE 2.36* 2.35* 2.34*  CALCIUM 8.7* 8.5* 8.7*  MG 2.0 1.9  --    Liver Function Tests No results for input(s): AST, ALT, ALKPHOS, BILITOT, PROT, ALBUMIN in the last 72 hours. No results for input(s): LIPASE, AMYLASE in the last 72 hours. Cardiac Enzymes No results for input(s): CKTOTAL, CKMB, CKMBINDEX, TROPONINI in the last 72 hours.  BNP: BNP (last 3 results) Recent Labs    05/27/20 0828 06/02/20 1832  BNP 2,213.1* 2,338.9*    ProBNP (last 3 results) Recent Labs    05/13/20 1550 05/31/20 0939  PROBNP 46,357* 48,555*     D-Dimer No results for input(s): DDIMER in the last 72 hours. Hemoglobin A1C No results for input(s): HGBA1C in the last 72 hours.  Fasting Lipid Panel No results for input(s): CHOL, HDL, LDLCALC, TRIG, CHOLHDL, LDLDIRECT in the last 72 hours. Thyroid Function Tests Recent Labs    06/06/20 0315  TSH 0.146*  T3FREE 2.4    Other results:   Imaging    No results found.   Medications:     Scheduled Medications: . acetaminophen  1,000 mg Oral TID  . amiodarone  200 mg Oral BID  . aspirin  81 mg Oral Daily  . atorvastatin  80 mg Oral Daily  . carvedilol  3.125 mg Oral BID WC  . Chlorhexidine Gluconate Cloth  6 each Topical Daily  . colestipol  1 g Oral BID  . dapagliflozin propanediol  10 mg Oral Daily  . insulin aspart  0-5 Units Subcutaneous QHS  . insulin aspart  0-9 Units Subcutaneous TID WC  . melatonin  3 mg Oral QHS  .  mesalamine  4.8 g Oral Q breakfast  . ondansetron (ZOFRAN) IV  4 mg Intravenous Once  . sodium chloride flush  3 mL Intravenous Q12H  . sodium chloride flush  3 mL Intravenous Q12H    Infusions: . sodium chloride    . sodium chloride    . sodium chloride    . sodium chloride 10 mL/hr at 06/07/20 0600  . cefTRIAXone (ROCEPHIN)  IV Stopped (06/06/20 2217)  . heparin 800 Units/hr (06/07/20 0650)  . norepinephrine (LEVOPHED) Adult infusion Stopped (06/06/20 1307)    PRN Medications: sodium chloride, sodium chloride, acetaminophen, ondansetron (ZOFRAN) IV, oxyCODONE, sodium chloride flush, sodium chloride flush   Assessment/Plan   1. Acute on chronic systolic CHF: Cardiolite in 11/21 with EF 26%, inferior fixed perfusion defect with no ischemia.  Echo in 12/21 with EF 35-40%, moderate LV dilation, mildly decreased RV function. Cause of cardiomyopathy uncertain but there is concern for ischemic cardiomyopathy.  Worsening CHF recently complicated by CKD stage IV. She has had NYHA class III symptoms.  She diuresed well initially but had VT arrest .   - RHC today - Continue Coreg 3.125 mg bid if BP stabilizes.  - Continue dapagliflozin 10 mg daily.  - Will be limited in terms of HF meds we can use with CKD 4, history of hyperkalemia, and soft BP.  For now, no ARNI/ARB/digoxin/spironolactone.   - Ideally, would have coronary angiography to look for CAD as cause of cardiomyopathy and VT.  However, creatinine is up today to 2.3.   2. CKD: Stage IV.  Creatinine higher at 2.3 today.  Has had recent hyperkalemia, now improved.   - Holding Lasix today, as above.  - RHC tomorrow to assess filling pressures, cardiac output.  3. Chronic atrial fibrillation: This appears permanent, x years.  Has biatrial severe enlargement and unlikely to hold NSR with DCCV attempt.  - Continue rate control.  - INR 1.8, cath cover with heparin gtt.  4. Low TSH: Concern for hyperthyroidism.  Free T4 is high, but free T3  is normal.  Have resent thyroid indices tomorrow and thyroid stimulating antibody level.  5. CAD: NSTEMI with HS-TnI up to 7990 post-VT arrest and CPR.  As above, have been concerned for CAD as cause of cardiomyopathy.  ?Rise in troponin due to demand ischemia versus ACS => no ischemic pain (just chest wall tenderness post-CPR) and no ECG change (old ASMI as in past).   - Cover with heparin gtt while INR low.  - Continue atorvastatin to 80 mg daily.  - Continue ASA 81 daily.  - Ideally, would do  coronary angiography but think we need improvement in creatinine for that. Probably just RHC today.   - Will probably get cardiac MRI to look for coronary-pattern scarring (versus some form of infiltrative disease).  6. Cardiac arrest: Pulseless VT.  Electrolytes stable. No chest pain or dyspnea prior.  ?Cause => suspect scar-mediated.  - Continue amiodarone 200 mg po bid.  -RHC today.  Will need CMRI - Will need EP to evaluate for ICD.  7. Pulmonary: RLL PNA post-arrest by CXR.  Suspect aspiration. Afebrile.  - She is now on ceftriaxone.  Cath this morning.  Length of Stay: [place], NP  06/07/2020, 7:23 AM  Advanced Heart Failure Team Pager [phone] (M-F; [place])  Please contact [facility] for night-coverage after hours (4p -7a ) and weekends on [email or web address]  Patient seen with NP, agree with the above note.   RHC Procedural Findings: Hemodynamics (mmHg) RA mean 9 RV 39/9 PA 42/20, mean 29 PCWP mean 19 Oxygen saturations: PA 55% AO 99% Cardiac Output (Fick) 4.65  Cardiac Index (Fick) 2.36 PVR 2.15 WU  Breathing ok, walked in hall yesterday.  Off norepinephrine.  No further VT. Mild chest wall tenderness from CPR, no dyspnea.   General: NAD Neck: JVP 8-9 cm, no thyromegaly or thyroid nodule.  Lungs: Clear to auscultation bilaterally with normal respiratory effort. CV: Nondisplaced PMI.  Heart irregular S1/S2, no S3/S4, no murmur.  Trace ankle edema.   Abdomen: Soft,  nontender, no hepatosplenomegaly, no distention.  Skin: Intact without lesions or rashes.  Neurologic: Alert and oriented x 3.  Psych: Normal affect. Extremities: No clubbing or cyanosis.  HEENT: Normal.   RHC today showed mild residual volume overload but relatively preserved cardiac output.  Coronary angiography not done as creatinine remains around 2.3.   - Lasix 60 mg IV x 1, start po diuretics tomorrow.  - Continue Coreg and dapagliflozin.  - meds limited by soft BP and CKD.  Will be [place] candidate.  - Will get cardiac MRI today to look for evidence of prior MI or infiltrative disease.  - Coronary angiography if creatinine trends down.   No further VT, remains on amiodarone po.  - EP to evaluate today for possible ICD.   Stable in chronic atrial fibrillation, currently on IV heparin gtt.   Discussed abnormal TFTs with endocrinology, will start low dose methimazole 5 mg qam.  Can go to step down.  Mobilize.   Loralie Champagne 06/07/2020 8:49 AM

## 2020-06-07 NOTE — Progress Notes (Signed)
Discussed with EP. OK to restart coumadin. Will notify pharmacy.   Tu Shimmel NP-C  1:33 PM

## 2020-06-07 NOTE — Progress Notes (Signed)
Handoff given to Janett Billow, RN via telephone. All pt belongings send to 2C06 including: one large black suitcase, a small purple bag, bilateral hearing aids (pt wearing), phone charger, and cell phone. Room checked for belongings.

## 2020-06-08 ENCOUNTER — Inpatient Hospital Stay (HOSPITAL_COMMUNITY): Payer: PPO

## 2020-06-08 DIAGNOSIS — I10 Essential (primary) hypertension: Secondary | ICD-10-CM | POA: Diagnosis not present

## 2020-06-08 DIAGNOSIS — I472 Ventricular tachycardia, unspecified: Secondary | ICD-10-CM

## 2020-06-08 DIAGNOSIS — N183 Chronic kidney disease, stage 3 unspecified: Secondary | ICD-10-CM | POA: Diagnosis not present

## 2020-06-08 DIAGNOSIS — E1121 Type 2 diabetes mellitus with diabetic nephropathy: Secondary | ICD-10-CM | POA: Diagnosis not present

## 2020-06-08 DIAGNOSIS — E782 Mixed hyperlipidemia: Secondary | ICD-10-CM | POA: Diagnosis not present

## 2020-06-08 DIAGNOSIS — I5021 Acute systolic (congestive) heart failure: Secondary | ICD-10-CM | POA: Diagnosis not present

## 2020-06-08 DIAGNOSIS — I469 Cardiac arrest, cause unspecified: Secondary | ICD-10-CM

## 2020-06-08 DIAGNOSIS — K219 Gastro-esophageal reflux disease without esophagitis: Secondary | ICD-10-CM | POA: Diagnosis not present

## 2020-06-08 DIAGNOSIS — N189 Chronic kidney disease, unspecified: Secondary | ICD-10-CM | POA: Diagnosis not present

## 2020-06-08 DIAGNOSIS — E1165 Type 2 diabetes mellitus with hyperglycemia: Secondary | ICD-10-CM | POA: Diagnosis not present

## 2020-06-08 DIAGNOSIS — H35033 Hypertensive retinopathy, bilateral: Secondary | ICD-10-CM | POA: Diagnosis not present

## 2020-06-08 DIAGNOSIS — N184 Chronic kidney disease, stage 4 (severe): Secondary | ICD-10-CM | POA: Diagnosis not present

## 2020-06-08 HISTORY — PX: IR THORACENTESIS ASP PLEURAL SPACE W/IMG GUIDE: IMG5380

## 2020-06-08 LAB — HEPATIC FUNCTION PANEL
ALT: 22 U/L (ref 0–44)
AST: 21 U/L (ref 15–41)
Albumin: 3 g/dL — ABNORMAL LOW (ref 3.5–5.0)
Alkaline Phosphatase: 57 U/L (ref 38–126)
Bilirubin, Direct: <0.1 mg/dL (ref 0.0–0.2)
Total Bilirubin: 0.6 mg/dL (ref 0.3–1.2)
Total Protein: 5.9 g/dL — ABNORMAL LOW (ref 6.5–8.1)

## 2020-06-08 LAB — CBC
HCT: 28.7 % — ABNORMAL LOW (ref 36.0–46.0)
Hemoglobin: 9 g/dL — ABNORMAL LOW (ref 12.0–15.0)
MCH: 29.5 pg (ref 26.0–34.0)
MCHC: 31.4 g/dL (ref 30.0–36.0)
MCV: 94.1 fL (ref 80.0–100.0)
Platelets: 229 10*3/uL (ref 150–400)
RBC: 3.05 MIL/uL — ABNORMAL LOW (ref 3.87–5.11)
RDW: 14.4 % (ref 11.5–15.5)
WBC: 7.4 10*3/uL (ref 4.0–10.5)
nRBC: 0 % (ref 0.0–0.2)

## 2020-06-08 LAB — BASIC METABOLIC PANEL
Anion gap: 17 — ABNORMAL HIGH (ref 5–15)
BUN: 61 mg/dL — ABNORMAL HIGH (ref 8–23)
CO2: 21 mmol/L — ABNORMAL LOW (ref 22–32)
Calcium: 8.7 mg/dL — ABNORMAL LOW (ref 8.9–10.3)
Chloride: 98 mmol/L (ref 98–111)
Creatinine, Ser: 2.7 mg/dL — ABNORMAL HIGH (ref 0.44–1.00)
GFR, Estimated: 18 mL/min — ABNORMAL LOW (ref >60)
Glucose, Bld: 143 mg/dL — ABNORMAL HIGH (ref 70–99)
Potassium: 4.1 mmol/L (ref 3.5–5.1)
Sodium: 136 mmol/L (ref 135–145)

## 2020-06-08 LAB — PROTEIN, PLEURAL OR PERITONEAL FLUID: Total protein, fluid: <3 g/dL

## 2020-06-08 LAB — BODY FLUID CELL COUNT WITH DIFFERENTIAL
Eos, Fluid: 0 %
Lymphs, Fluid: 40 %
Monocyte-Macrophage-Serous Fluid: 54 % (ref 50–90)
Neutrophil Count, Fluid: 6 % (ref 0–25)
Total Nucleated Cell Count, Fluid: 240 cu mm (ref 0–1000)

## 2020-06-08 LAB — THYROID STIMULATING IMMUNOGLOBULIN: Thyroid Stimulating Immunoglob: <0.1 IU/L (ref 0.00–0.55)

## 2020-06-08 LAB — MAGNESIUM: Magnesium: 2.1 mg/dL (ref 1.7–2.4)

## 2020-06-08 LAB — PROTIME-INR
INR: 2.1 — ABNORMAL HIGH (ref 0.8–1.2)
Prothrombin Time: 22.6 seconds — ABNORMAL HIGH (ref 11.4–15.2)

## 2020-06-08 LAB — ALBUMIN, PLEURAL OR PERITONEAL FLUID: Albumin, Fluid: 1.8 g/dL

## 2020-06-08 LAB — GLUCOSE, CAPILLARY
Glucose-Capillary: 131 mg/dL — ABNORMAL HIGH (ref 70–99)
Glucose-Capillary: 156 mg/dL — ABNORMAL HIGH (ref 70–99)
Glucose-Capillary: 168 mg/dL — ABNORMAL HIGH (ref 70–99)
Glucose-Capillary: 206 mg/dL — ABNORMAL HIGH (ref 70–99)

## 2020-06-08 LAB — LACTATE DEHYDROGENASE, PLEURAL OR PERITONEAL FLUID: LD, Fluid: 78 U/L — ABNORMAL HIGH (ref 3–23)

## 2020-06-08 LAB — LACTATE DEHYDROGENASE: LDH: 235 U/L — ABNORMAL HIGH (ref 98–192)

## 2020-06-08 MED ORDER — LIDOCAINE HCL (PF) 1 % IJ SOLN
INTRAMUSCULAR | Status: AC | PRN
  Administered 2020-06-08: 10 mL

## 2020-06-08 MED ORDER — WARFARIN 0.5 MG HALF TABLET
0.5000 mg | ORAL_TABLET | Freq: Once | ORAL | Status: AC
  Administered 2020-06-08: 0.5 mg via ORAL
  Filled 2020-06-08: qty 1

## 2020-06-08 MED ORDER — LIDOCAINE HCL 1 % IJ SOLN
INTRAMUSCULAR | Status: AC
  Filled 2020-06-08: qty 20

## 2020-06-08 NOTE — Plan of Care (Signed)

## 2020-06-08 NOTE — Progress Notes (Signed)
CARDIAC REHAB PHASE I   Went to offer to walk with pt. Pt out of room for procedure. Will f/u as pts schedule allows.  Rufina Falco, RN BSN 06/08/2020 10:37 AM

## 2020-06-08 NOTE — Progress Notes (Signed)
Physical Therapy Treatment Patient Details Name: Cynthia Hansen MRN: [id number] DOB: 1941-02-25 Today's Date: 06/08/2020    History of Present Illness 80yo female admitted with acute systolic CHF. PMH A-fib, acute on chronic heart failure, DCM wtih 30-35% ejection fraction, DM, HLD, 06/05/20 Cardiac arrest with 4 min CPR, 06/08/20 R L thorocentesis     PT Comments    Post cardiac arrest pt is experiencing increased weakness and decreased endurance. Pt is also exhibiting long standing nausea with hiatal hernia, and historic back and neck pain. Pt requires min A for bed mobility, transfers and ambulation of 40 feet with RW. Pt with strong desire to return to PLOF and is willing to work to achieve. Due to change in independence with mobility, PT recommending CIR level rehab prior to return home. PT will continue to follow acutely.    Follow Up Recommendations  CIR     Equipment Recommendations  3in1 (PT);Rolling walker with 5" wheels       Precautions / Restrictions Precautions Precautions: Fall;Other (comment) Precaution Comments: watch HR, chronic BLE numbness Restrictions Weight Bearing Restrictions: No    Mobility  Bed Mobility Overal bed mobility: Needs Assistance Bed Mobility: Supine to Sit     Supine to sit: HOB elevated;Min assist     General bed mobility comments: min A for rolling to sidelying and pushing up to seated, after finished pt reports she normally gets out on L side of bed  Transfers Overall transfer level: Needs assistance Equipment used: None Transfers: Sit to/from Stand Sit to Stand: Min assist         General transfer comment: min A for power up and steading in RW pt with nausea in positional change  Ambulation/Gait Ambulation/Gait assistance: Min assist Gait Distance (Feet): 40 Feet Assistive device: Rolling walker (2 wheeled) Gait Pattern/deviations: Step-through pattern;Drifts right/left;Narrow base of support Gait velocity: decreased Gait  velocity interpretation: <1.31 ft/sec, indicative of household ambulator General Gait Details: min A for steadying with RW      Balance Overall balance assessment: Needs assistance Sitting-balance support: Bilateral upper extremity supported;Feet supported Sitting balance-Leahy Scale: Good     Standing balance support: No upper extremity supported;During functional activity Standing balance-Leahy Scale: Fair Standing balance comment: mild unsteadiness increases with fatigue and she ultimately needed MinA to maintain balance dynamically                            Cognition Arousal/Alertness: Awake/alert Behavior During Therapy: WFL for tasks assessed/performed Overall Cognitive Status: Within Functional Limits for tasks assessed                                           General Comments General comments (skin integrity, edema, etc.): VSS on RA      Pertinent Vitals/Pain Pain Assessment: Faces Faces Pain Scale: Hurts little more Pain Location: back and neck with movement Pain Descriptors / Indicators: Aching;Sore Pain Intervention(s): Limited activity within patient's tolerance;Monitored during session;Repositioned           PT Goals (current goals can now be found in the care plan section) Acute Rehab PT Goals Patient Stated Goal: feel better, get stronger PT Goal Formulation: With patient/family Time For Goal Achievement: 06/19/20 Potential to Achieve Goals: Good Progress towards PT goals: Not progressing toward goals - comment (medical decline since Eval)    Frequency  Min 3X/week      PT Plan Discharge plan needs to be updated       AM-PAC PT "6 Clicks" Mobility   Outcome Measure  Help needed turning from your back to your side while in a flat bed without using bedrails?: None Help needed moving from lying on your back to sitting on the side of a flat bed without using bedrails?: A Little Help needed moving to and from a  bed to a chair (including a wheelchair)?: A Little Help needed standing up from a chair using your arms (e.g., wheelchair or bedside chair)?: A Little Help needed to walk in hospital room?: A Little Help needed climbing 3-5 steps with a railing? : A Lot 6 Click Score: 18    End of Session Equipment Utilized During Treatment: Gait belt Activity Tolerance: Patient tolerated treatment well Patient left: in chair;with call bell/phone within reach Nurse Communication: Mobility status PT Visit Diagnosis: Unsteadiness on feet (R26.81);Muscle weakness (generalized) (M62.81);Difficulty in walking, not elsewhere classified (R26.2)     Time: 6503-5465 PT Time Calculation (min) (ACUTE ONLY): 30 min  Charges:  $Gait Training: 8-22 mins $Therapeutic Activity: 8-22 mins                     Cynthia Hansen B. Migdalia Dk PT, DPT Acute Rehabilitation Services Pager [phone] Office [phone]    [place] 06/08/2020, 1:03 PM

## 2020-06-08 NOTE — Progress Notes (Signed)
Progress Note  Patient Name: Cynthia Hansen Date of Encounter: 06/08/2020  [facility] Cardiologist: Larae Grooms, MD   Subjective   Generally fatigued, not SOB, no CP  Inpatient Medications    Scheduled Meds: . acetaminophen  1,000 mg Oral TID  . amiodarone  200 mg Oral BID  . aspirin  81 mg Oral Daily  . atorvastatin  80 mg Oral Daily  . carvedilol  3.125 mg Oral BID WC  . Chlorhexidine Gluconate Cloth  6 each Topical Daily  . colestipol  1 g Oral BID  . dapagliflozin propanediol  10 mg Oral Daily  . insulin aspart  0-5 Units Subcutaneous QHS  . insulin aspart  0-9 Units Subcutaneous TID WC  . melatonin  3 mg Oral QHS  . mesalamine  4.8 g Oral Q breakfast  . methimazole  5 mg Oral Daily  . ondansetron (ZOFRAN) IV  4 mg Intravenous Once  . sodium chloride flush  3 mL Intravenous Q12H  . sodium chloride flush  3 mL Intravenous Q12H  . Warfarin - Pharmacist Dosing Inpatient   Does not apply q1600   Continuous Infusions: . sodium chloride 10 mL/hr at 06/08/20 0700  . sodium chloride    . cefTRIAXone (ROCEPHIN)  IV Stopped (06/07/20 2244)   PRN Meds: sodium chloride, acetaminophen, ondansetron (ZOFRAN) IV, oxyCODONE, sodium chloride flush   Vital Signs    Vitals:   06/08/20 0435 06/08/20 0620 06/08/20 0803 06/08/20 0822  BP: (!) 90/52  113/64   Pulse: 88  97 87  Resp: 16  17   Temp: 97.8 F (36.6 C)  97.6 F (36.4 C)   TempSrc: Oral  Oral   SpO2: 94%  98%   Weight:  79.1 kg    Height:        Intake/Output Summary (Last 24 hours) at 06/08/2020 0827 Last data filed at 06/08/2020 0823 Gross per 24 hour  Intake 683.31 ml  Output 575 ml  Net 108.31 ml   Last 3 Weights 06/08/2020 06/07/2020 06/07/2020  Weight (lbs) 174 lb 6.1 oz 174 lb 9.7 oz 175 lb 4.3 oz  Weight (kg) 79.1 kg 79.2 kg 79.5 kg      Telemetry    AFib w/CVR, generally 70's-80's - Personally Reviewed  ECG    No new EKGs - Personally Reviewed  Physical Exam   GEN: No acute distress.    Neck: No JVD Cardiac: RRR, no murmurs, rubs, or gallops.  Respiratory: CTA b/l. GI: Soft, nontender, non-distended  MS: trace edema; No deformity. Neuro:  Nonfocal  Psych: Normal affect   Labs    High Sensitivity Troponin:   Recent Labs  Lab 06/05/20 2020 06/05/20 2238 06/06/20 0315 06/06/20 0606  TROPONINIHS 1,317* 4,017* 6,872* 7,990*      Chemistry Recent Labs  Lab 06/02/20 1832 06/03/20 0247 06/06/20 2252 06/07/20 0611 06/07/20 0826 06/08/20 0130  NA 137   < > 137 136 138  138 136  K 6.1*   < > 3.9 4.0 4.1  4.1 4.1  CL 105   < > 100 102  --  98  CO2 20*   < > 22 21*  --  21*  GLUCOSE 146*   < > 136* 120*  --  143*  BUN 64*   < > 55* 53*  --  61*  CREATININE 2.27*   < > 2.35* 2.34*  --  2.70*  CALCIUM 9.1   < > 8.5* 8.7*  --  8.7*  PROT  6.6  --   --   --   --   --   ALBUMIN 3.8  --   --   --   --   --   AST 30  --   --   --   --   --   ALT 21  --   --   --   --   --   ALKPHOS 66  --   --   --   --   --   BILITOT 0.9  --   --   --   --   --   GFRNONAA 22*   < > 21* 21*  --  18*  ANIONGAP 12   < > 15 13  --  17*   < > = values in this interval not displayed.     Hematology Recent Labs  Lab 06/06/20 2252 06/07/20 0611 06/07/20 0826 06/08/20 0130  WBC 9.5 10.0  --  7.4  RBC 3.23* 3.22*  --  3.05*  HGB 9.4* 9.7* 10.5*  10.5* 9.0*  HCT 30.7* 30.4* 31.0*  31.0* 28.7*  MCV 95.0 94.4  --  94.1  MCH 29.1 30.1  --  29.5  MCHC 30.6 31.9  --  31.4  RDW 14.2 14.3  --  14.4  PLT 255 263  --  229    BNP Recent Labs  Lab 06/02/20 1832  BNP 2,338.9*     DDimer No results for input(s): DDIMER in the last 168 hours.   Radiology     MR CARDIAC MORPHOLOGY W WO CONTRAST Result Date: 06/07/2020 CLINICAL DATA:  Cardiomyopathy of uncertain etiology EXAM: CARDIAC MRI TECHNIQUE: The patient was scanned on a 1.5 Tesla GE magnet. A dedicated cardiac coil was used. Functional imaging was done using Fiesta sequences. 2,3, and 4 chamber views were done to assess  for RWMA's. Modified Simpson's rule using a short axis stack was used to calculate an ejection fraction on a dedicated work Conservation officer, nature. The patient received 8 cc of Gadavist. After 10 minutes inversion recovery sequences were used to assess for infiltration and scar tissue. FINDINGS: Limited images of the lung fields showed a moderate right pleural effusion. Difficult images due to atrial fibrillation and respiratory artifact. Normal left ventricular size and wall thickness. Unable to quantify LV systolic function due to respiratory artifact. Would estimate LV EF 25-30% range. Diffuse hypokinesis, worse in the inferior wall. The right ventricle looks normal in size with mild-moderate systolic dysfunction. Moderate left and right atrial enlargement. Trileaflet aortic valve, no significant stenosis or regurgitation. There does not appear to be more than mild mitral regurgitation. Difficult delayed enhancement images. In the short axis images, there was concern for possible subtle mid inferior wall subendocardial late gadolinium enhancement (LGE) but this was not seen in the long axis images. IMPRESSION: 1. Very difficult images due to respiratory artifact and atrial fibrillation. 2. Due to poor images, unable to quantify LV EF. Estimate EF 25-30%, diffuse hypokinesis looking worse in the inferior wall. 3.  Normal RV size with mild-moderately decreased systolic function. 4. Very difficult delayed enhancement images. Possible subtle subendocardial LGE in the mid inferior wall seen on the short axis images, but this could not be confirmed in the long axis images. There does not appear to be a large amount of scar. Dalton Mclean Electronically Signed   By: Loralie Champagne M.D.   On: 06/07/2020 17:11    Cardiac Studies   CARDIAC CATHETERIZATION Result  Date: 06/07/2020 1. Mildly elevated filling pressures 2. Pulmonary venous hypertension (mild) 3. Relatively preserved cardiac output  05/04/20:  lexiscan stress myoview Myocardial perfusion is abnormal. Findings consistent with prior myocardial infarction. This is a high risk study. Overall left ventricular systolic function was abnormal. LV cavity size is normal. Nuclear stress EF: 26%. The left ventricular ejection fraction is severely decreased (<30%). that was performed on 5/2/2007Compared to the prior study, there are changes. Defect 1: There is a medium defect of severe severity present in the basal inferior, mid inferior, apical inferior and apical lateral location. The defect is non-reversible.  Defect 2: There is a small defect of moderate severity present in the mid inferoseptal, apical septal and apex location. The defect is non-reversible.   03/14/2020: TTE IMPRESSIONS  1. Left ventricular ejection fraction, by estimation, is 35 to 40%. The  left ventricle has moderately decreased function. Left ventricular  endocardial border not optimally defined to evaluate regional wall motion.  The left ventricular internal cavity  size was moderately dilated. Left ventricular diastolic function could not  be evaluated.  2. Right ventricular systolic function is mildly reduced. The right  ventricular size is normal. There is mildly elevated pulmonary artery  systolic pressure. The estimated right ventricular systolic pressure is  48.8 mmHg.  3. Left atrial size was moderately dilated.  4. Right atrial size was severely dilated.  5. The mitral valve is normal in structure. Mild to moderate mitral valve  regurgitation. No evidence of mitral stenosis. Moderate mitral annular  calcification.  6. Tricuspid valve regurgitation is moderate.  7. The aortic valve is normal in structure. Aortic valve regurgitation is  not visualized. No aortic stenosis is present.  8. The inferior vena cava is normal in size with greater than 50%  respiratory variability, suggesting right atrial pressure of 3  mmHg.Consider limited  study with definity contrast to better evsaluate for  wall motion abnormalities    03/23/2006: TTE SUMMARY  - Overall left ventricular systolic function was normal.  - Mildly thickened trileaflet aortic valve.  - There was mild atheroma of the aortic arch. There was mild     atheroma of the descending aorta.  - There was mild mitral valvular regurgitation.  - The left atrium was dilated. There was no left atrial appendage     thrombus identified.   Patient Profile     80 y.o. female with a hx of HLD, DM, + smoker quit 2017, PVD (50-69% RICA and patent Left carotid endarterectomy 5/19/39m follow w/Dr. Oneida Alar), AFib (permanent), DCM, CKD (IV) admitted with acute/chronic CHF developed pulseless VT arrest while here.  Assessment & Plan    1. VT 2. NSTEMI     MMVT 06/06/19     No notable electrolyte derangement     She has been transitioned to PO amiodarone (yesterday)  Ideally would get LHC, though renal disease felt not to allow  C.MRI will be helpful CM is newly found Not sure to what extent she will be able to get to GDMT  Dr. Quentin Ore has seen and examined the patient this morning, pt encouraged OOB with PT and mobilize. Would like her a couple days OOB and near d/c with plans towards Friday for ICD  The patient was made aware of [place] law, no driving 67mo yesterday     2. CM     Suspected to be  NICM given no reversible defects on her stress test      No current plans for  LHC 2/2 renal disease     She does have known PVD and was a ong standing smoker with 2 fixed defects on her stress test Nov 2021     unfortunately c/MRI was limited 3. Acute/chronic CHF    RHC as above      C/w HF team 4. AKI on CKD (IV)     (March 2021 1.9 >> Dec 9th 2.98 > Dec 22 was  2.26)    back on the rise  C/w AHF team    5. Post arrest pneumonia     On abx      WBC remains normalized     Afebrile  6. Permanent AFib     CHA2DS2Vasc is 6, outpt was on  warfarin     heparin gtt here > held today for RHC     warfarin resumed, INR today is 2.1     Would ask that her goal for now be 2-2.5, try to keep <3.0 for planned ICD implant tenatively scheduled for Friday   For questions or updates, please contact [place] Please consult [email or web address] for contact info under        Signed, Baldwin [place], PA-C  06/08/2020, 8:27 AM

## 2020-06-08 NOTE — Procedures (Signed)
PROCEDURE SUMMARY:  Successful US guided right thoracentesis. Yielded 700 ml of clear yellow fluid. Pt tolerated procedure well. No immediate complications.  Specimen sent for labs. CXR ordered; no post-procedure pneumothorax identified  EBL < 2 mL  Theresa Duty, NP 06/08/2020 11:31 AM

## 2020-06-08 NOTE — Progress Notes (Addendum)
Patient ID: Cynthia Hansen, female   DOB: 1941-01-09, 80 y.o.   MRN: [id number]     Advanced Heart Failure Rounding Note  PCP-Cardiologist: Larae Grooms, MD   Subjective:    06/05/20  patient had pulseless VT requiring shock x 1 and CPR x 4 minutes.  She had rapid recovery and was not intubated.  Electrolytes stable.  Amiodarone gtt started.  Due to low BP, she was started on norepinephrine 4 peripherally.  Creatinine rose 2.1 => 2.36 and HS-TnI rose to 7990.  Currently, only mild chest wall tenderness post-CPR.    CXR post-arrest with RLL infiltrate thought to be aspiration, now on ceftriaxone. She is AF. WBC nl ECG w/ atrial fibrillation and old ASMI. QTc 426.     06/08/19 RHC demonstrated mildly elevated filling pressures and relatively preserved CO. No LHC due to abnormal renal function.   cMRI limited study. Very difficult images due to respiratory artifact and atrial fibrillation.   Feels ok this morning. Continues w/ chest wall soreness. No dyspnea. Remains in rate controlled Afib. No further VT.  SBPs remains soft, 90s-low 100s.   SCr rising, 2.34>>2.70 today.    RHC 06/08/19  Right Heart  Right Heart Pressures RHC Procedural Findings: Hemodynamics (mmHg) RA mean 9 RV 39/9 PA 42/20, mean 29 PCWP mean 19  Oxygen saturations: PA 55% AO 99%  Cardiac Output (Fick) 4.65  Cardiac Index (Fick) 2.36 PVR 2.15 WU   Conclusion  1. Mildly elevated filling pressures 2. Pulmonary venous hypertension (mild) 3. Relatively preserved cardiac output  cMRI 06/08/19  IMPRESSION: 1. Very difficult images due to respiratory artifact and atrial fibrillation.  2. Due to poor images, unable to quantify LV EF. Estimate EF 25-30%, diffuse hypokinesis looking worse in the inferior wall.  3.  Normal RV size with mild-moderately decreased systolic function.  4. Very difficult delayed enhancement images. Possible subtle subendocardial LGE in the mid inferior wall seen on the  short axis images, but this could not be confirmed in the long axis images. There does not appear to be a large amount of scar.   Objective:   Weight Range: 79.1 kg Body mass index is 26.51 kg/m.   Vital Signs:   Temp:  [97.7 F (36.5 C)-97.8 F (36.6 C)] 97.8 F (36.6 C) (01/04 0435) Pulse Rate:  [51-216] 88 (01/04 0435) Resp:  [12-29] 16 (01/04 0435) BP: (87-118)/(44-80) 90/52 (01/04 0435) SpO2:  [92 %-100 %] 94 % (01/04 0435) Weight:  [79.1 kg-79.2 kg] 79.1 kg (01/04 0620) Last BM Date: 06/05/20  Weight change: Filed Weights   06/07/20 0500 06/07/20 2154 06/08/20 0620  Weight: 79.5 kg 79.2 kg 79.1 kg    Intake/Output:   Intake/Output Summary (Last 24 hours) at 06/08/2020 0738 Last data filed at 06/08/2020 0700 Gross per 24 hour  Intake 680.31 ml  Output 575 ml  Net 105.31 ml      Physical Exam    General:  Mildly fatigued appearing elderly WF. No respiratory difficulty HEENT: normal Neck: supple. JVD 8 cm. Carotids 2+ bilat; no bruits. No lymphadenopathy or thyromegaly appreciated. Cor: PMI nondisplaced. irregularly irregular rhythm. No rubs, gallops or murmurs. Lungs: clear. No wheezing  Abdomen: soft, nontender, nondistended. No hepatosplenomegaly. No bruits or masses. Good bowel sounds. Extremities: no cyanosis, clubbing, rash, edema Neuro: alert & oriented x 3, cranial nerves grossly intact. moves all 4 extremities w/o difficulty. Affect pleasant.   Telemetry    A fib. 70-80s. No further VT    Labs  CBC Recent Labs    06/07/20 0611 06/07/20 0826 06/08/20 0130  WBC 10.0  --  7.4  HGB 9.7* 10.5*  10.5* 9.0*  HCT 30.4* 31.0*  31.0* 28.7*  MCV 94.4  --  94.1  PLT 263  --  253   Basic Metabolic Panel Recent Labs    06/06/20 2252 06/07/20 0611 06/07/20 0826 06/08/20 0130  NA 137 136 138  138 136  K 3.9 4.0 4.1  4.1 4.1  CL 100 102  --  98  CO2 22 21*  --  21*  GLUCOSE 136* 120*  --  143*  BUN 55* 53*  --  61*  CREATININE 2.35*  2.34*  --  2.70*  CALCIUM 8.5* 8.7*  --  8.7*  MG 1.9  --   --  2.1   Liver Function Tests No results for input(s): AST, ALT, ALKPHOS, BILITOT, PROT, ALBUMIN in the last 72 hours. No results for input(s): LIPASE, AMYLASE in the last 72 hours. Cardiac Enzymes No results for input(s): CKTOTAL, CKMB, CKMBINDEX, TROPONINI in the last 72 hours.  BNP: BNP (last 3 results) Recent Labs    05/27/20 0828 06/02/20 1832  BNP 2,213.1* 2,338.9*    ProBNP (last 3 results) Recent Labs    05/13/20 1550 05/31/20 0939  PROBNP 46,357* 48,555*     D-Dimer No results for input(s): DDIMER in the last 72 hours. Hemoglobin A1C No results for input(s): HGBA1C in the last 72 hours. Fasting Lipid Panel No results for input(s): CHOL, HDL, LDLCALC, TRIG, CHOLHDL, LDLDIRECT in the last 72 hours. Thyroid Function Tests Recent Labs    06/06/20 0315  TSH 0.146*  T3FREE 2.4    Other results:   Imaging    CARDIAC CATHETERIZATION  Result Date: 06/07/2020 1. Mildly elevated filling pressures 2. Pulmonary venous hypertension (mild) 3. Relatively preserved cardiac output  MR CARDIAC MORPHOLOGY W WO CONTRAST  Result Date: 06/07/2020 CLINICAL DATA:  Cardiomyopathy of uncertain etiology EXAM: CARDIAC MRI TECHNIQUE: The patient was scanned on a 1.5 Tesla GE magnet. A dedicated cardiac coil was used. Functional imaging was done using Fiesta sequences. 2,3, and 4 chamber views were done to assess for RWMA's. Modified Simpson's rule using a short axis stack was used to calculate an ejection fraction on a dedicated work Conservation officer, nature. The patient received 8 cc of Gadavist. After 10 minutes inversion recovery sequences were used to assess for infiltration and scar tissue. FINDINGS: Limited images of the lung fields showed a moderate right pleural effusion. Difficult images due to atrial fibrillation and respiratory artifact. Normal left ventricular size and wall thickness. Unable to quantify LV  systolic function due to respiratory artifact. Would estimate LV EF 25-30% range. Diffuse hypokinesis, worse in the inferior wall. The right ventricle looks normal in size with mild-moderate systolic dysfunction. Moderate left and right atrial enlargement. Trileaflet aortic valve, no significant stenosis or regurgitation. There does not appear to be more than mild mitral regurgitation. Difficult delayed enhancement images. In the short axis images, there was concern for possible subtle mid inferior wall subendocardial late gadolinium enhancement (LGE) but this was not seen in the long axis images. IMPRESSION: 1. Very difficult images due to respiratory artifact and atrial fibrillation. 2. Due to poor images, unable to quantify LV EF. Estimate EF 25-30%, diffuse hypokinesis looking worse in the inferior wall. 3.  Normal RV size with mild-moderately decreased systolic function. 4. Very difficult delayed enhancement images. Possible subtle subendocardial LGE in the mid inferior wall seen  on the short axis images, but this could not be confirmed in the long axis images. There does not appear to be a large amount of scar. Brolin Dambrosia Electronically Signed   By: Loralie Champagne M.D.   On: 06/07/2020 17:11     Medications:     Scheduled Medications: . acetaminophen  1,000 mg Oral TID  . amiodarone  200 mg Oral BID  . aspirin  81 mg Oral Daily  . atorvastatin  80 mg Oral Daily  . carvedilol  3.125 mg Oral BID WC  . Chlorhexidine Gluconate Cloth  6 each Topical Daily  . colestipol  1 g Oral BID  . dapagliflozin propanediol  10 mg Oral Daily  . insulin aspart  0-5 Units Subcutaneous QHS  . insulin aspart  0-9 Units Subcutaneous TID WC  . melatonin  3 mg Oral QHS  . mesalamine  4.8 g Oral Q breakfast  . methimazole  5 mg Oral Daily  . ondansetron (ZOFRAN) IV  4 mg Intravenous Once  . sodium chloride flush  3 mL Intravenous Q12H  . sodium chloride flush  3 mL Intravenous Q12H  . Warfarin - Pharmacist  Dosing Inpatient   Does not apply q1600    Infusions: . sodium chloride 10 mL/hr at 06/08/20 0700  . sodium chloride    . cefTRIAXone (ROCEPHIN)  IV Stopped (06/07/20 2244)    PRN Medications: sodium chloride, acetaminophen, ondansetron (ZOFRAN) IV, oxyCODONE, sodium chloride flush   Assessment/Plan   1. Acute on chronic systolic CHF: Cardiolite in 11/21 with EF 26%, inferior fixed perfusion defect with no ischemia.  Echo in 12/21 with EF 35-40%, moderate LV dilation, mildly decreased RV function. Cause of cardiomyopathy uncertain but there is concern for ischemic cardiomyopathy. Cardiac MRI was difficult study, EF 25-30% estimated without definitive LGE.  Worsening CHF recently complicated by CKD stage IV. She has had NYHA class III symptoms.  She diuresed well initially but had VT arrest .   - RHC this admit w/ mildly elevated filling pressures, mild pulmonary HTN and relatively preserved CO.  - hold diuretics again today w/ rising SCr.  - On Coreg 3.125 mg bid but monitor BP (may need to allow higher BP for renal perfusion)  - Continue dapagliflozin 10 mg daily.  - Will be limited in terms of HF meds we can use with CKD 4, history of hyperkalemia, and soft BP.  For now, no ARNI/ARB/digoxin/spironolactone.   - Will be Verquvo candidate. - Ideally, would have coronary angiography to look for CAD as cause of cardiomyopathy and VT.  However, creatinine is up today to 2.7 2. CKD: Stage IV.  Creatinine higher at 2.7 today.  Has had recent hyperkalemia, now improved.   - Holding Lasix today, as above.  - RHC yesterday showed relatively preserved cardiac output.  - ? Holding  blocker to allow increased renal perfusion. Aim to keep SBP > 110  3. Chronic atrial fibrillation: This appears permanent, x years.  Has biatrial severe enlargement and unlikely to hold NSR with DCCV attempt.  - Continue rate control.  - INR 2.1 4. Low TSH: Concern for hyperthyroidism.  Free T4 is high, but free T3 is  normal. Thyroid stimulating antibody level pending. - Discussed abnormal TFTs with endocrinology, will start low dose methimazole 5 mg qam. 5. CAD: NSTEMI with HS-TnI up to 7990 post-VT arrest and CPR.  As above, have been concerned for CAD as cause of cardiomyopathy.  ?Rise in troponin due to demand ischemia versus  ACS => no ischemic pain (just chest wall tenderness post-CPR) and no ECG change (old ASMI as in past).   - treated w/ heparin gtt, now off. INR 2.1 today  - Continue atorvastatin 80 mg daily.  - Continue ASA 81 daily.  - Ideally, would do coronary angiography but think we need improvement in creatinine for that.  - cMRI w/ poor imaging, Very difficult delayed enhancement images. Possible subtle subendocardial LGE in the mid inferior wall seen on the short axis images, but this could not be confirmed in the long axis images. There does not appear to be a large amount of scar. 6. Cardiac arrest: Pulseless VT.  Electrolytes stable. No chest pain or dyspnea prior.  ?Cause => suspect scar-mediated.  - Continue amiodarone 200 mg po bid.  - EP following. Plan for ICD  7. Pulmonary: RLL PNA post-arrest by CXR.  Suspect aspiration. Afebrile.  - She is now on ceftriaxone.   Length of Stay: [address], [place]  06/08/2020, 7:38 AM  Advanced Heart Failure Team Pager [phone] (M-F; 7a - 4p)  Please contact [facility] for night-coverage after hours (4p -7a ) and weekends on [email or web address]  Patient seen with NP, agree with the above note.   No further VT.  Creatinine up to 2.7 today.  Weight stable, had 1 dose of IV Lasix yesterday.  No complaints but has not been out of bed.   General: NAD Neck: JVP 8 cm, no thyromegaly or thyroid nodule.  Lungs: Decreased BS right base. CV: Nondisplaced PMI.  Heart regular S1/S2, no S3/S4, no murmur.  No peripheral edema.   Abdomen: Soft, nontender, no hepatosplenomegaly, no distention.  Skin: Intact without lesions or rashes.  Neurologic:  Alert and oriented x 3.  Psych: Normal affect. Extremities: No clubbing or cyanosis.  HEENT: Normal.    Suspect mild residual volume overload but creatinine up to 2.7.  Cardiac MRI with EF 25-30%, difficult study but does not have extensive LGE.   - No diuretic today with rise in creatinine.  - Continue Coreg and dapagliflozin.  - meds limited by soft BP and CKD.  Will be Verquvo candidate.  - Coronary angiography if creatinine trends down eventually.   No further VT, remains on amiodarone po.  - EP has seen, want to get her mobilized then ICD probably on Friday.   Stable in chronic atrial fibrillation, INR now 2.1 and off heparin gtt.  Keep INR 2-2.5 for ICD.   Discussed abnormal TFTs with endocrinology, started low dose methimazole 5 mg qam.  Moderate right pleural effusion on cardiac MRI.  Will get CXR/right lat decubitus.  If significant fluid, thoracentesis by IR and send fluid for studies.   Ambulate/PT  Loralie Champagne 06/08/2020 10:15 AM

## 2020-06-08 NOTE — Progress Notes (Signed)
[place] for Heparin > warfarin Indication: atrial fibrillation  Allergies  Allergen Reactions  . Avandia [Rosiglitazone] Other (See Comments)    Causes muscle weakness.  . Latex Rash  . Sulfa Antibiotics Rash    Patient Measurements: Height: 5\' 8"  (172.7 cm) Weight: 79.1 kg (174 lb 6.1 oz) IBW/kg (Calculated) : 63.9 Heparin Dosing Weight: 79.3 kg  Vital Signs: Temp: 97.3 F (36.3 C) (01/04 1117) Temp Source: Oral (01/04 1117) BP: 93/56 (01/04 1117) Pulse Rate: 79 (01/04 1117)  Labs: Recent Labs    06/05/20 2020 06/05/20 2238 06/06/20 0315 06/06/20 0606 06/06/20 1650 06/06/20 2252 06/07/20 0400 06/07/20 0611 06/07/20 0826 06/08/20 0130  HGB   < >  --  10.3*  --   --  9.4*  --  9.7* 10.5*  10.5* 9.0*  HCT   < >  --  31.8*  --   --  30.7*  --  30.4* 31.0*  31.0* 28.7*  PLT   < >  --  297  --   --  255  --  263  --  229  LABPROT   < >  --  21.1*  --   --  20.4*  --  20.6*  --  22.6*  INR   < >  --  1.9*  --   --  1.8*  --  1.8*  --  2.1*  HEPARINUNFRC  --   --   --  1.08* 0.42 0.35 0.33  --   --   --   CREATININE  --   --  2.36*  --   --  2.35*  --  2.34*  --  2.70*  TROPONINIHS  --  4,017* 8,891* 7,990*  --   --   --   --   --   --    < > = values in this interval not displayed.    Estimated Creatinine Clearance: 19 mL/min (A) (by C-G formula based on SCr of 2.7 mg/dL (H)).   Medical History: Past Medical History:  Diagnosis Date  . A-fib ([place])   . Acute on chronic combined systolic (congestive) and diastolic (congestive) heart failure ([place])   . Carotid artery occlusion   . DCM (dilated cardiomyopathy) ([place])    EF 30-35% by echo 05/2020  . Diabetes mellitus   . Hyperlipidemia   . Irregular heart beat   . Vitamin D deficiency      Assessment: [name] on warfarin for afib PTA now holding warfarin for [facility]. Pharmacy consulted to start heparin when INR <2. Admit INR 2.8> down to 1.8.   Last Coumadin dose taken  12/28.  Warfarin resumed post cath 1/3 INR 1.8>2.1 after small boost from pta dose Goal to keep INR 2-2.5 for ICD placement 1/7  Warfarin PTA dose: 1.5mg  Fridays, 1mg  all other days (Last seen 05/26/20 by [facility]).    Goal of Therapy:  Heparin level 0.3-0.7 units/ml Monitor platelets by anticoagulation protocol: Yes   Plan:  -Warfarin 0.5mg  x1 tonight then resume PTA dose 1mg  daily tomorrow -Daily CBC/Protime -Monitor for bleeding    Bonnita Nasuti Pharm.D. CPP, BCPS Clinical Pharmacist [phone] 06/08/2020 1:03 PM

## 2020-06-08 NOTE — Progress Notes (Signed)
Inpatient Rehab Admissions Coordinator Note:   Per therapy recommendations, pt was screened for CIR candidacy by Shann Medal, PT, DPT.  At this time we are recommending CIR consult.  If pt would like to be considered for CIR, please place an IP Rehab MD consult order and an OT consult.  Please contact me with questions.   Shann Medal, PT, DPT [phone] 06/08/20 3:10 PM

## 2020-06-08 NOTE — Progress Notes (Signed)
CARDIAC REHAB PHASE I   Offered to walk with pt. Pt states recent ambulation with PT. Pt states she feels much better since procedure this morning. Pt given HF booklet. Reviewed importance of daily weights, symptom monitoring, and salt restrictions. Pt states she will have some difficulty implementing all of the necessary changes, but has more life to enjoy. Encouraged continued ambulation. Will continue to follow.  [phone] Rufina Falco, RN BSN 06/08/2020 2:08 PM

## 2020-06-09 ENCOUNTER — Inpatient Hospital Stay (HOSPITAL_COMMUNITY): Payer: PPO

## 2020-06-09 DIAGNOSIS — N179 Acute kidney failure, unspecified: Secondary | ICD-10-CM | POA: Diagnosis not present

## 2020-06-09 DIAGNOSIS — I5021 Acute systolic (congestive) heart failure: Secondary | ICD-10-CM | POA: Diagnosis not present

## 2020-06-09 LAB — GLUCOSE, CAPILLARY
Glucose-Capillary: 104 mg/dL — ABNORMAL HIGH (ref 70–99)
Glucose-Capillary: 170 mg/dL — ABNORMAL HIGH (ref 70–99)
Glucose-Capillary: 206 mg/dL — ABNORMAL HIGH (ref 70–99)
Glucose-Capillary: 213 mg/dL — ABNORMAL HIGH (ref 70–99)

## 2020-06-09 LAB — HEMOGLOBIN AND HEMATOCRIT, BLOOD
HCT: 27.1 % — ABNORMAL LOW (ref 36.0–46.0)
Hemoglobin: 8.8 g/dL — ABNORMAL LOW (ref 12.0–15.0)

## 2020-06-09 LAB — CBC
HCT: 27.9 % — ABNORMAL LOW (ref 36.0–46.0)
Hemoglobin: 8.6 g/dL — ABNORMAL LOW (ref 12.0–15.0)
MCH: 29.1 pg (ref 26.0–34.0)
MCHC: 30.8 g/dL (ref 30.0–36.0)
MCV: 94.3 fL (ref 80.0–100.0)
Platelets: 227 10*3/uL (ref 150–400)
RBC: 2.96 MIL/uL — ABNORMAL LOW (ref 3.87–5.11)
RDW: 14.2 % (ref 11.5–15.5)
WBC: 7.8 10*3/uL (ref 4.0–10.5)
nRBC: 0 % (ref 0.0–0.2)

## 2020-06-09 LAB — MAGNESIUM: Magnesium: 2.2 mg/dL (ref 1.7–2.4)

## 2020-06-09 LAB — BASIC METABOLIC PANEL
Anion gap: 13 (ref 5–15)
Anion gap: 13 (ref 5–15)
BUN: 69 mg/dL — ABNORMAL HIGH (ref 8–23)
BUN: 70 mg/dL — ABNORMAL HIGH (ref 8–23)
CO2: 23 mmol/L (ref 22–32)
CO2: 20 mmol/L — ABNORMAL LOW (ref 22–32)
Calcium: 8.5 mg/dL — ABNORMAL LOW (ref 8.9–10.3)
Calcium: 8.6 mg/dL — ABNORMAL LOW (ref 8.9–10.3)
Chloride: 99 mmol/L (ref 98–111)
Chloride: 100 mmol/L (ref 98–111)
Creatinine, Ser: 2.86 mg/dL — ABNORMAL HIGH (ref 0.44–1.00)
Creatinine, Ser: 2.87 mg/dL — ABNORMAL HIGH (ref 0.44–1.00)
GFR, Estimated: 16 mL/min — ABNORMAL LOW (ref >60)
GFR, Estimated: 16 mL/min — ABNORMAL LOW (ref >60)
Glucose, Bld: 139 mg/dL — ABNORMAL HIGH (ref 70–99)
Glucose, Bld: 206 mg/dL — ABNORMAL HIGH (ref 70–99)
Potassium: 3.9 mmol/L (ref 3.5–5.1)
Potassium: 3.9 mmol/L (ref 3.5–5.1)
Sodium: 135 mmol/L (ref 135–145)
Sodium: 133 mmol/L — ABNORMAL LOW (ref 135–145)

## 2020-06-09 LAB — PROTIME-INR
INR: 3.5 — ABNORMAL HIGH (ref 0.8–1.2)
Prothrombin Time: 34.3 seconds — ABNORMAL HIGH (ref 11.4–15.2)

## 2020-06-09 MED ORDER — MIDODRINE HCL 5 MG PO TABS
2.5000 mg | ORAL_TABLET | Freq: Three times a day (TID) | ORAL | Status: DC
  Administered 2020-06-09: 2.5 mg via ORAL
  Filled 2020-06-09: qty 1

## 2020-06-09 MED ORDER — BOOST / RESOURCE BREEZE PO LIQD CUSTOM
1.0000 | Freq: Once | ORAL | Status: AC
  Administered 2020-06-09: 1 via ORAL

## 2020-06-09 MED ORDER — MIDODRINE HCL 5 MG PO TABS
5.0000 mg | ORAL_TABLET | Freq: Three times a day (TID) | ORAL | Status: DC
  Administered 2020-06-10 – 2020-06-16 (×18): 5 mg via ORAL
  Filled 2020-06-09 (×18): qty 1

## 2020-06-09 NOTE — Progress Notes (Addendum)
Patient ID: Cynthia Hansen, female   DOB: [birth year], 80 y.o.   MRN: [id number]     Advanced Heart Failure Rounding Note  PCP-Cardiologist: Larae Grooms, MD   Subjective:    06/05/20  patient had pulseless VT requiring shock x 1 and CPR x 4 minutes.  She had rapid recovery and was not intubated.  Electrolytes stable.  Amiodarone gtt started.  Due to low BP, she was started on norepinephrine 4 peripherally.  Creatinine rose 2.1 => 2.36 and HS-TnI rose to 7990.  Currently, only mild chest wall tenderness post-CPR.   06/08/19 RHC demonstrated mildly elevated filling pressures and relatively preserved CO. No LHC due to abnormal renal function. cMRI limited study. Very difficult images due to respiratory artifact and atrial fibrillation.  06/09/19 S/P Thoracentesis on R. 700 cc removed.   Denies SOB. Having some muscular pain.    Objective:   Weight Range: 78.7 kg Body mass index is 26.38 kg/m.   Vital Signs:   Temp:  [97.3 F (36.3 C)-98.2 F (36.8 C)] 97.8 F (36.6 C) (01/05 0748) Pulse Rate:  [72-79] 73 (01/05 0748) Resp:  [17-18] 17 (01/05 0318) BP: (93-113)/(51-64) 113/61 (01/05 0748) SpO2:  [96 %-98 %] 98 % (01/05 0748) Weight:  [78.7 kg] 78.7 kg (01/05 0455) Last BM Date: 06/05/20  Weight change: Filed Weights   06/07/20 2154 06/08/20 0620 06/09/20 0455  Weight: 79.2 kg 79.1 kg 78.7 kg    Intake/Output:   Intake/Output Summary (Last 24 hours) at 06/09/2020 0958 Last data filed at 06/09/2020 0904 Gross per 24 hour  Intake 617.05 ml  Output --  Net 617.05 ml      Physical Exam   General:  No resp difficulty. In bed.  HEENT: normal Neck: supple. no JVD. Carotids 2+ bilat; no bruits. No lymphadenopathy or thryomegaly appreciated. Cor: PMI nondisplaced. Irregular rate & rhythm. No rubs, gallops or murmurs. Lungs: clear Abdomen: soft, nontender, nondistended. No hepatosplenomegaly. No bruits or masses. Good bowel sounds. Extremities: no cyanosis, clubbing, rash,  edema Neuro: alert & orientedx3, cranial nerves grossly intact. moves all 4 extremities w/o difficulty. Affect pleasant    Telemetry    A Fib 70-80s    Labs    CBC Recent Labs    06/08/20 0130 06/09/20 0017  WBC 7.4 7.8  HGB 9.0* 8.6*  HCT 28.7* 27.9*  MCV 94.1 94.3  PLT 229 683   Basic Metabolic Panel Recent Labs    06/08/20 0130 06/09/20 0017  NA 136 135  K 4.1 3.9  CL 98 99  CO2 21* 23  GLUCOSE 143* 139*  BUN 61* 69*  CREATININE 2.70* 2.86*  CALCIUM 8.7* 8.5*  MG 2.1 2.2   Liver Function Tests Recent Labs    06/08/20 1112  AST 21  ALT 22  ALKPHOS 57  BILITOT 0.6  PROT 5.9*  ALBUMIN 3.0*   No results for input(s): LIPASE, AMYLASE in the last 72 hours. Cardiac Enzymes No results for input(s): CKTOTAL, CKMB, CKMBINDEX, TROPONINI in the last 72 hours.  BNP: BNP (last 3 results) Recent Labs    05/27/20 0828 06/02/20 1832  BNP 2,213.1* 2,338.9*    ProBNP (last 3 results) Recent Labs    05/13/20 1550 05/31/20 0939  PROBNP 46,357* 48,555*     D-Dimer No results for input(s): DDIMER in the last 72 hours. Hemoglobin A1C No results for input(s): HGBA1C in the last 72 hours. Fasting Lipid Panel No results for input(s): CHOL, HDL, LDLCALC, TRIG, CHOLHDL, LDLDIRECT in the last  72 hours. Thyroid Function Tests No results for input(s): TSH, T4TOTAL, T3FREE, THYROIDAB in the last 72 hours.  Invalid input(s): FREET3  Other results:   Imaging    DG Chest 1 View  Result Date: 06/08/2020 CLINICAL DATA:  Post right thoracentesis EXAM: CHEST  1 VIEW COMPARISON:  06/05/2020 FINDINGS: Decreased right pleural effusion following thoracentesis. No pneumothorax. Improved aeration in the right lung base with persistent airspace disease. Small left pleural effusion.  Negative for pulmonary edema. IMPRESSION: No complication post right thoracentesis. Improved aeration right lower lobe. Electronically Signed   By: Franchot Gallo M.D.   On: 06/08/2020 10:33    DG Chest Right Decubitus  Result Date: 06/08/2020 CLINICAL DATA:  Pleural effusion EXAM: CHEST - RIGHT DECUBITUS COMPARISON:  06/05/2020 FINDINGS: Following right thoracentesis, right lateral decubitus view was obtained. Small right pleural effusions present. Right lower lobe airspace disease is present. Left lung appears clear. IMPRESSION: Small layering right effusion. Mild right lower lobe airspace disease. Electronically Signed   By: Franchot Gallo M.D.   On: 06/08/2020 10:35   IR THORACENTESIS ASP PLEURAL SPACE W/IMG GUIDE  Result Date: 06/08/2020 INDICATION: Patient with recent pulseless VT requiring shock and CPR; history of persistent atrial fibrillation. Patient has now developed pleural effusions. Interventional Radiology asked to perform a diagnostic and therapeutic thoracentesis. EXAM: ULTRASOUND GUIDED THORACENTESIS MEDICATIONS: 1% lidocaine 20 mL COMPLICATIONS: None immediate.  No pneumothorax on follow-up radiograph. PROCEDURE: An ultrasound guided thoracentesis was thoroughly discussed with the patient and questions answered. The benefits, risks, alternatives and complications were also discussed. The patient understands and wishes to proceed with the procedure. Written consent was obtained. Ultrasound was performed to localize and mark an adequate pocket of fluid in the right chest. The area was then prepped and draped in the normal sterile fashion. 1% Lidocaine was used for local anesthesia. Under ultrasound guidance a 6 Fr Safe-T-Centesis catheter was introduced. Thoracentesis was performed. The catheter was removed and a dressing applied. FINDINGS: A total of approximately 700 mL of clear yellow fluid was removed. Samples were sent to the laboratory as requested by the clinical team. IMPRESSION: Successful ultrasound guided right thoracentesis yielding 700 mL of pleural fluid. Read by: Soyla Dryer, NP Electronically Signed   By: Lucrezia Europe M.D.   On: 06/08/2020 11:30      Medications:     Scheduled Medications:  acetaminophen  1,000 mg Oral TID   amiodarone  200 mg Oral BID   aspirin  81 mg Oral Daily   atorvastatin  80 mg Oral Daily   carvedilol  3.125 mg Oral BID WC   Chlorhexidine Gluconate Cloth  6 each Topical Daily   colestipol  1 g Oral BID   dapagliflozin propanediol  10 mg Oral Daily   insulin aspart  0-5 Units Subcutaneous QHS   insulin aspart  0-9 Units Subcutaneous TID WC   melatonin  3 mg Oral QHS   mesalamine  4.8 g Oral Q breakfast   methimazole  5 mg Oral Daily   ondansetron (ZOFRAN) IV  4 mg Intravenous Once   sodium chloride flush  3 mL Intravenous Q12H   sodium chloride flush  3 mL Intravenous Q12H   Warfarin - Pharmacist Dosing Inpatient   Does not apply q1600    Infusions:  sodium chloride Stopped (06/08/20 2313)   sodium chloride     cefTRIAXone (ROCEPHIN)  IV Stopped (06/08/20 2206)    PRN Medications: sodium chloride, acetaminophen, ondansetron (ZOFRAN) IV, sodium chloride flush  Assessment/Plan   1. Acute on chronic systolic CHF: Cardiolite in 11/21 with EF 26%, inferior fixed perfusion defect with no ischemia.  Echo in 12/21 with EF 35-40%, moderate LV dilation, mildly decreased RV function. Cause of cardiomyopathy uncertain but there is concern for ischemic cardiomyopathy. Cardiac MRI was difficult study, EF 25-30% estimated without definitive LGE.  Worsening CHF recently complicated by CKD stage IV. She has had NYHA class III symptoms.  She diuresed well initially but had VT arrest .   - RHC this admit w/ mildly elevated filling pressures, mild pulmonary HTN and relatively preserved CO.  - Volume status stable. No diuretic today.  - On Coreg 3.125 mg bid.  May need to stop for renal perfusion.  - Continue dapagliflozin 10 mg daily.  - Will be limited in terms of HF meds we can use with CKD 4, history of hyperkalemia, and soft BP.  For now, no ARNI/ARB/digoxin/spironolactone.   - Will be  Verquvo candidate. - Ideally, would have coronary angiography to look for CAD as cause of cardiomyopathy and VT.  However, creatinine is up today to 2.8  2. CKD: Stage IV.  Creatinine higher at 2.8 today.  Has had recent hyperkalemia, now improved.   -No diuretics.  - RHC  showed relatively preserved cardiac output.  - ? Holding  blocker to allow increased renal perfusion. Aim to keep SBP > 110  3. Chronic atrial fibrillation: This appears permanent, x years.  Has biatrial severe enlargement and unlikely to hold NSR with DCCV attempt.  - Rate controlled.   - INR 3.5  4. Low TSH: Concern for hyperthyroidism.  Free T4 is high, but free T3 is normal. Thyroid stimulating antibody level pending. - Discussed abnormal TFTs with endocrinology, started on  low dose methimazole 5 mg qam. 5. CAD: NSTEMI with HS-TnI up to 7990 post-VT arrest and CPR.  As above, have been concerned for CAD as cause of cardiomyopathy.  ?Rise in troponin due to demand ischemia versus ACS => no ischemic pain (just chest wall tenderness post-CPR) and no ECG change (old ASMI as in past).   - treated w/ heparin gtt, now off. INR 3.5  Today. No chest pain.  - Continue atorvastatin 80 mg daily.  - Continue ASA 81 daily.  - Ideally, would do coronary angiography but think we need improvement in creatinine for that.  - cMRI w/ poor imaging, Very difficult delayed enhancement images. Possible subtle subendocardial LGE in the mid inferior wall seen on the short axis images, but this could not be confirmed in the long axis images. There does not appear to be a large amount of scar. 6. Cardiac arrest: Pulseless VT.  Electrolytes stable. No chest pain or dyspnea prior.  ?Cause => suspect scar-mediated.  - Continue amiodarone 200 mg po bid.  - EP following. Plan for ICD later this week.  7. Pulmonary: RLL PNA post-arrest by CXR.  Suspect aspiration. Afebrile.  - She is now on ceftriaxone. 8. Pleural Effusion S/P R Thoracentesis with 700  cc removed.   Consult CIR today. PT following.   Length of Stay: [place], NP  06/09/2020, 9:58 AM  Advanced Heart Failure Team Pager [phone] (M-F; 7a - 4p)  Please contact [facility] for night-coverage after hours (4p -7a ) and weekends on [email or web address]  Patient seen with NP, agree with the above note.   No further VT.  Creatinine up to 2.86 today.  Weight stable, no Lasix yesterday.  Walked down hall with  PT, had some dyspnea with walk.  BP lower today and was orthostatic.   Thoracentesis yesterday on right, 700 cc out.  Transudative.   General: NAD Neck: JVP 8-9 cm, no thyromegaly or thyroid nodule.  Lungs: Clear to auscultation bilaterally with normal respiratory effort. CV: Nondisplaced PMI.  Heart irregular S1/S2, no S3/S4, no murmur.  No peripheral edema.   Abdomen: Soft, nontender, no hepatosplenomegaly, no distention.  Skin: Intact without lesions or rashes.  Neurologic: Alert and oriented x 3.  Psych: Normal affect. Extremities: No clubbing or cyanosis.  HEENT: Normal.   Suspect mild residual volume overload but creatinine up to 2.86.  Cardiac MRI with EF 25-30%, difficult study but does not have extensive LGE.   - No diuretic today with rise in creatinine.  - With soft BP and orthostasis, hold Coreg and will start low dose midodrine 2.5 mg tid (will try to get her off this eventually).  - meds limited by soft BP and CKD. Will be Verquvo candidate.  - Coronary angiography if creatinine trends down eventually but probably no time soon in absence of definite ACS.   No further VT, remains on amiodarone po.  - EP has seen, want to get her mobilized then ICD probably on Friday.  - Off Coreg with soft BP.   Stable in chronic atrial fibrillation, INR up to 3.5.  Keep INR 2-2.5 for ICD. No warfarin tonight.   Discussed abnormal TFTs with endocrinology, started low dose methimazole 5 mg qam.  Hgb trending down, ?chronic disease/renal disease.  No overt GI  bleeding.  Will send anemia studies and FOBT.   Ceftriaxone for possible aspiration PNA with arrest.   AKI, she has not been aggressively diuresed for a couple of days now.  I wonder if this could be ATN related to her cardiac arrest.  - Hold diuretics, only mildly volume overloaded on exam.  - Try to optimize BP as above.  - Consult renal.   Loralie Champagne 06/09/2020 12:28 PM

## 2020-06-09 NOTE — Progress Notes (Signed)
CARDIAC REHAB PHASE I   PRE:  Rate/Rhythm: 79 Afib  BP:  Sitting: 104/58      SaO2: 97 RA  MODE:  Ambulation: 90 ft   POST:  Rate/Rhythm: 104 Afib with PVCs  BP:  Sitting: 90/52    SaO2: 96 RA   Pt helped to BR than ambulated 28ft in hallway assist of one with front wheel walker. Pt took several short standing rest breaks c/o back pain and SOB. Pt coached through purse lipped breathing. Pt returned to recliner, call bell and bedside table within reach. Encouraged sitting in chair and continued ambulation as tolerated. Will continue to follow.  [phone] Rufina Falco, RN BSN 06/09/2020 10:38 AM

## 2020-06-09 NOTE — Progress Notes (Signed)
[place] for Heparin > warfarin Indication: atrial fibrillation  Allergies  Allergen Reactions  . Avandia [Rosiglitazone] Other (See Comments)    Causes muscle weakness.  . Latex Rash  . Sulfa Antibiotics Rash    Patient Measurements: Height: 5\' 8"  (172.7 cm) Weight: 78.7 kg (173 lb 8 oz) IBW/kg (Calculated) : 63.9 Heparin Dosing Weight: 79.3 kg  Vital Signs: Temp: 97.8 F (36.6 C) (01/05 0748) Temp Source: Oral (01/05 0748) BP: 113/61 (01/05 0748) Pulse Rate: 73 (01/05 0748)  Labs: Recent Labs    06/06/20 1650 06/06/20 2252 06/06/20 2252 06/07/20 0400 06/07/20 0611 06/07/20 0826 06/08/20 0130 06/09/20 0017  HGB  --  9.4*   < >  --  9.7* 10.5*  10.5* 9.0* 8.6*  HCT  --  30.7*   < >  --  30.4* 31.0*  31.0* 28.7* 27.9*  PLT  --  255   < >  --  263  --  229 227  LABPROT  --  20.4*   < >  --  20.6*  --  22.6* 34.3*  INR  --  1.8*   < >  --  1.8*  --  2.1* 3.5*  HEPARINUNFRC 0.42 0.35  --  0.33  --   --   --   --   CREATININE  --  2.35*   < >  --  2.34*  --  2.70* 2.86*   < > = values in this interval not displayed.    Estimated Creatinine Clearance: 17.9 mL/min (A) (by C-G formula based on SCr of 2.86 mg/dL (H)).   Medical History: Past Medical History:  Diagnosis Date  . A-fib ([place])   . Acute on chronic combined systolic (congestive) and diastolic (congestive) heart failure ([place])   . Carotid artery occlusion   . DCM (dilated cardiomyopathy) ([place])    EF 30-35% by echo 05/2020  . Diabetes mellitus   . Hyperlipidemia   . Irregular heart beat   . Vitamin D deficiency      Assessment: [name] on warfarin for afib PTA now holding warfarin for [facility]. Pharmacy consulted to start heparin when INR <2. Admit INR 2.8> down to 1.8.   Last Coumadin dose taken 12/28.  Warfarin resumed post cath 1/3 INR 1.8>2.1>3.5 after small boost from pta dose Goal to keep INR 2-2.5 for ICD placement 1/7 Hold warfarin and allow to drift  down, will also have her drink some ensure today because it has a small amount vit K and will keep INR from rising more   Warfarin PTA dose: 1.5mg  Fridays, 1mg  all other days (Last seen 05/26/20 by [facility]).    Goal of Therapy:  Heparin level 0.3-0.7 units/ml Monitor platelets by anticoagulation protocol: Yes   Plan:  -No warfarin today Will have patient drink some ensure for small amount Vit K and Keep INR from continuing to trend up -Daily CBC/Protime -Monitor for bleeding    Bonnita Nasuti Pharm.D. CPP, BCPS Clinical Pharmacist [phone] 06/09/2020 11:19 AM

## 2020-06-09 NOTE — Plan of Care (Signed)

## 2020-06-09 NOTE — Progress Notes (Addendum)
Progress Note  Patient Name: Cynthia Hansen Date of Encounter: 06/09/2020  [facility] Cardiologist: Larae Grooms, MD   Subjective   Generally fatigued, not SOB, no CP  Inpatient Medications    Scheduled Meds: . acetaminophen  1,000 mg Oral TID  . amiodarone  200 mg Oral BID  . aspirin  81 mg Oral Daily  . atorvastatin  80 mg Oral Daily  . carvedilol  3.125 mg Oral BID WC  . Chlorhexidine Gluconate Cloth  6 each Topical Daily  . colestipol  1 g Oral BID  . dapagliflozin propanediol  10 mg Oral Daily  . insulin aspart  0-5 Units Subcutaneous QHS  . insulin aspart  0-9 Units Subcutaneous TID WC  . melatonin  3 mg Oral QHS  . mesalamine  4.8 g Oral Q breakfast  . methimazole  5 mg Oral Daily  . ondansetron (ZOFRAN) IV  4 mg Intravenous Once  . sodium chloride flush  3 mL Intravenous Q12H  . sodium chloride flush  3 mL Intravenous Q12H  . Warfarin - Pharmacist Dosing Inpatient   Does not apply q1600   Continuous Infusions: . sodium chloride Stopped (06/08/20 2313)  . sodium chloride    . cefTRIAXone (ROCEPHIN)  IV Stopped (06/08/20 2206)   PRN Meds: sodium chloride, acetaminophen, ondansetron (ZOFRAN) IV, sodium chloride flush   Vital Signs    Vitals:   06/08/20 2314 06/09/20 0318 06/09/20 0455 06/09/20 0748  BP: (!) 104/51 104/64  113/61  Pulse: 75 75  73  Resp: 18 17    Temp: 97.7 F (36.5 C) 98.2 F (36.8 C)  97.8 F (36.6 C)  TempSrc: Oral Oral  Oral  SpO2: 96% 97%  98%  Weight:   78.7 kg   Height:        Intake/Output Summary (Last 24 hours) at 06/09/2020 0820 Last data filed at 06/09/2020 0000 Gross per 24 hour  Intake 500.05 ml  Output --  Net 500.05 ml   Last 3 Weights 06/09/2020 06/08/2020 06/07/2020  Weight (lbs) 173 lb 8 oz 174 lb 6.1 oz 174 lb 9.7 oz  Weight (kg) 78.7 kg 79.1 kg 79.2 kg      Telemetry    AFib w/CVR, generally 70's-80's - Personally Reviewed  ECG    No new EKGs - Personally Reviewed  Physical Exam   GEN: No acute  distress.   Neck: No JVD Cardiac: RRR, no murmurs, rubs, or gallops.  Respiratory: CTA b/l. GI: Soft, nontender, non-distended  MS: trace edema; No deformity. Neuro:  Nonfocal  Psych: Normal affect   Labs    High Sensitivity Troponin:   Recent Labs  Lab 06/05/20 2020 06/05/20 2238 06/06/20 0315 06/06/20 0606  TROPONINIHS 1,317* 4,017* 6,872* 7,990*      Chemistry Recent Labs  Lab 06/02/20 1832 06/03/20 0247 06/07/20 7035 06/07/20 0826 06/08/20 0130 06/08/20 1112 06/09/20 0017  NA 137   < > 136 138  138 136  --  135  K 6.1*   < > 4.0 4.1  4.1 4.1  --  3.9  CL 105   < > 102  --  98  --  99  CO2 20*   < > 21*  --  21*  --  23  GLUCOSE 146*   < > 120*  --  143*  --  139*  BUN 64*   < > 53*  --  61*  --  69*  CREATININE 2.27*   < > 2.34*  --  2.70*  --  2.86*  CALCIUM 9.1   < > 8.7*  --  8.7*  --  8.5*  PROT 6.6  --   --   --   --  5.9*  --   ALBUMIN 3.8  --   --   --   --  3.0*  --   AST 30  --   --   --   --  21  --   ALT 21  --   --   --   --  22  --   ALKPHOS 66  --   --   --   --  57  --   BILITOT 0.9  --   --   --   --  0.6  --   GFRNONAA 22*   < > 21*  --  18*  --  16*  ANIONGAP 12   < > 13  --  17*  --  13   < > = values in this interval not displayed.     Hematology Recent Labs  Lab 06/07/20 0611 06/07/20 0826 06/08/20 0130 06/09/20 0017  WBC 10.0  --  7.4 7.8  RBC 3.22*  --  3.05* 2.96*  HGB 9.7* 10.5*  10.5* 9.0* 8.6*  HCT 30.4* 31.0*  31.0* 28.7* 27.9*  MCV 94.4  --  94.1 94.3  MCH 30.1  --  29.5 29.1  MCHC 31.9  --  31.4 30.8  RDW 14.3  --  14.4 14.2  PLT 263  --  229 227    BNP Recent Labs  Lab 06/02/20 1832  BNP 2,338.9*     DDimer No results for input(s): DDIMER in the last 168 hours.   Radiology     MR CARDIAC MORPHOLOGY W WO CONTRAST Result Date: 06/07/2020 CLINICAL DATA:  Cardiomyopathy of uncertain etiology EXAM: CARDIAC MRI TECHNIQUE: The patient was scanned on a 1.5 Tesla GE magnet. A dedicated cardiac coil was used.  Functional imaging was done using Fiesta sequences. 2,3, and 4 chamber views were done to assess for RWMA's. Modified Simpson's rule using a short axis stack was used to calculate an ejection fraction on a dedicated work Conservation officer, nature. The patient received 8 cc of Gadavist. After 10 minutes inversion recovery sequences were used to assess for infiltration and scar tissue. FINDINGS: Limited images of the lung fields showed a moderate right pleural effusion. Difficult images due to atrial fibrillation and respiratory artifact. Normal left ventricular size and wall thickness. Unable to quantify LV systolic function due to respiratory artifact. Would estimate LV EF 25-30% range. Diffuse hypokinesis, worse in the inferior wall. The right ventricle looks normal in size with mild-moderate systolic dysfunction. Moderate left and right atrial enlargement. Trileaflet aortic valve, no significant stenosis or regurgitation. There does not appear to be more than mild mitral regurgitation. Difficult delayed enhancement images. In the short axis images, there was concern for possible subtle mid inferior wall subendocardial late gadolinium enhancement (LGE) but this was not seen in the long axis images. IMPRESSION: 1. Very difficult images due to respiratory artifact and atrial fibrillation. 2. Due to poor images, unable to quantify LV EF. Estimate EF 25-30%, diffuse hypokinesis looking worse in the inferior wall. 3.  Normal RV size with mild-moderately decreased systolic function. 4. Very difficult delayed enhancement images. Possible subtle subendocardial LGE in the mid inferior wall seen on the short axis images, but this could not be confirmed in the long axis images. There does  not appear to be a large amount of scar. Dalton Mclean Electronically Signed   By: Loralie Champagne M.D.   On: 06/07/2020 17:11    Cardiac Studies   CARDIAC CATHETERIZATION Result Date: 06/07/2020 1. Mildly elevated filling pressures  2. Pulmonary venous hypertension (mild) 3. Relatively preserved cardiac output  05/04/20: lexiscan stress myoview Myocardial perfusion is abnormal. Findings consistent with prior myocardial infarction. This is a high risk study. Overall left ventricular systolic function was abnormal. LV cavity size is normal. Nuclear stress EF: 26%. The left ventricular ejection fraction is severely decreased (<30%). that was performed on 5/2/2007Compared to the prior study, there are changes. Defect 1: There is a medium defect of severe severity present in the basal inferior, mid inferior, apical inferior and apical lateral location. The defect is non-reversible.  Defect 2: There is a small defect of moderate severity present in the mid inferoseptal, apical septal and apex location. The defect is non-reversible.   03/14/2020: TTE IMPRESSIONS  1. Left ventricular ejection fraction, by estimation, is 35 to 40%. The  left ventricle has moderately decreased function. Left ventricular  endocardial border not optimally defined to evaluate regional wall motion.  The left ventricular internal cavity  size was moderately dilated. Left ventricular diastolic function could not  be evaluated.  2. Right ventricular systolic function is mildly reduced. The right  ventricular size is normal. There is mildly elevated pulmonary artery  systolic pressure. The estimated right ventricular systolic pressure is  65.7 mmHg.  3. Left atrial size was moderately dilated.  4. Right atrial size was severely dilated.  5. The mitral valve is normal in structure. Mild to moderate mitral valve  regurgitation. No evidence of mitral stenosis. Moderate mitral annular  calcification.  6. Tricuspid valve regurgitation is moderate.  7. The aortic valve is normal in structure. Aortic valve regurgitation is  not visualized. No aortic stenosis is present.  8. The inferior vena cava is normal in size with greater than  50%  respiratory variability, suggesting right atrial pressure of 3  mmHg.Consider limited study with definity contrast to better evsaluate for  wall motion abnormalities    03/23/2006: TTE SUMMARY  - Overall left ventricular systolic function was normal.  - Mildly thickened trileaflet aortic valve.  - There was mild atheroma of the aortic arch. There was mild     atheroma of the descending aorta.  - There was mild mitral valvular regurgitation.  - The left atrium was dilated. There was no left atrial appendage     thrombus identified.   Patient Profile     80 y.o. female with a hx of HLD, DM, + smoker quit 2017, PVD (50-69% RICA and patent Left carotid endarterectomy 5/19/12m follow w/Dr. Oneida Alar), AFib (permanent), DCM, CKD (IV) admitted with acute/chronic CHF developed pulseless VT arrest while here.  Assessment & Plan    1. VT 2. NSTEMI     MMVT 06/06/19     No notable electrolyte derangement     She has been transitioned to PO amiodarone (yesterday)  Ideally would get LHC, though renal disease felt not to allow  CM is newly found Not sure to what extent she will be able to get to GDMT  Dr. Quentin Ore has seen and examined the patient this morning, pt encouraged to continue OOB with PT and mobilize. Would like her labs to stabilize,  and near d/c with plans towards Friday for ICD, she is on Friday's schedule   The patient was made aware of  [place] law, no driving 63mo      2. CM     Suspected to be  NICM given no reversible defects on her stress test      No current plans for LHC 2/2 renal disease      She does have known PVD and was a ong standing smoker with 2 fixed defects on her stress test Nov 2021     unfortunately c/MRI was limited, though no large scar burden 3. Acute/chronic CHF    RHC as above     S/p thora yesterday    C/w HF team 4. AKI on CKD (IV)     (March 2021 1.9 >> Dec 9th 2.98 > Dec 22 was  2.26)     continues on the  rise  Recommend consideration for consulting nephrology, patient was to have seen Dr. Arty Baumgartner in consult out pt recently though missed the appt with some scheduling confusion Will defer to the attending team Will check afternoon labs   5. Post arrest pneumonia     On abx      WBC remains normalized     Afebrile  6. Permanent AFib     CHA2DS2Vasc is 6, outpt was on warfarin     heparin gtt here > held today for RHC     warfarin resumed, INR today is 2.1     Would ask that her goal for now be 2-2.5, try to keep <3.0 for planned ICD implant tenatively scheduled for Friday      Rapid rise in INR to 3.5 today, pharmacy will hold warfarin today  7. Anemia     H/H down from 2 days ago     She looks well perfused, no pallor     Check H/H later today   For questions or updates, please contact [place] Please consult [email or web address] for contact info under        Signed, Baldwin [place], PA-C  06/09/2020, 8:20 AM

## 2020-06-09 NOTE — Evaluation (Signed)
Occupational Therapy Evaluation Patient Details Name: Cynthia Hansen MRN: [id number] DOB: 1941-02-23 Today's Date: 06/09/2020    History of Present Illness 80yo female admitted with acute systolic CHF. PMH A-fib, acute on chronic heart failure, DCM wtih 30-35% ejection fraction, DM, HLD, 06/05/20 Cardiac arrest with 4 min CPR, 06/08/20 R lung thorocentesis   Clinical Impression   Patient admitted for the diagnosis above.  Patient is scheduled for a pacemaker this week.  Deficits impacting independence are listed below.  Skilled OT to engage in ADL and in room mobility to maximize functional status in the acute setting.  CIR has been recommended for post acute rehab.  PTA patient lived alone and was very independent.  Currently she is needing Min A with transfers, Min A with upper body ADL and up to Mod A with lower body ADL.  Patient is very motivated, and should regain independence with post acute rehab.      Follow Up Recommendations  CIR    Equipment Recommendations  None recommended by OT    Recommendations for Other Services       Precautions / Restrictions Precautions Precautions: Fall Precaution Comments: watch HR, chronic BLE numbness Restrictions Weight Bearing Restrictions: No      Mobility Bed Mobility Overal bed mobility: Needs Assistance Bed Mobility: Supine to Sit;Sit to Supine     Supine to sit: Min assist Sit to supine: Min assist        Transfers Overall transfer level: Needs assistance   Transfers: Sit to/from [employer] Transfers Sit to Stand: Min assist Stand pivot transfers: Min assist            Balance   Sitting-balance support: Bilateral upper extremity supported;Feet supported Sitting balance-Leahy Scale: Good     Standing balance support: Bilateral upper extremity supported Standing balance-Leahy Scale: Poor Standing balance comment: HHA this date.                           ADL either performed or assessed with  clinical judgement   ADL Overall ADL's : Needs assistance/impaired Eating/Feeding: Independent;Sitting   Grooming: Wash/dry hands;Wash/dry face;Supervision/safety;Sitting   Upper Body Bathing: Minimal assistance;Sitting   Lower Body Bathing: Moderate assistance;Sit to/from stand   Upper Body Dressing : Minimal assistance;Sitting   Lower Body Dressing: Moderate assistance;Sit to/from stand   Toilet Transfer: Minimal assistance;RW   Toileting- Clothing Manipulation and Hygiene: Min guard;Sit to/from stand       Functional mobility during ADLs: Minimal assistance;Rolling walker General ADL Comments: dizziness noted -- 12 point drop with SBP.  MD aware and discontinuing a med to increase BP.     Vision Baseline Vision/History: No visual deficits Patient Visual Report: No change from baseline       Perception     Praxis      Pertinent Vitals/Pain Faces Pain Scale: Hurts a little bit Pain Location: back and neck with movement Pain Descriptors / Indicators: Aching;Sore Pain Intervention(s): Monitored during session     Hand Dominance Right   Extremity/Trunk Assessment Upper Extremity Assessment Upper Extremity Assessment: Generalized weakness   Lower Extremity Assessment Lower Extremity Assessment: Defer to PT evaluation   Cervical / Trunk Assessment Cervical / Trunk Assessment: Kyphotic   Communication Communication Communication: No difficulties   Cognition Arousal/Alertness: Awake/alert Behavior During Therapy: WFL for tasks assessed/performed Overall Cognitive Status: Within Functional Limits for tasks assessed  General Comments   initial SBP 95. Noted dizzy with stand, retaken and dropped to 89.  MD in room and notified.      Exercises     Shoulder Instructions      Home Living Family/patient expects to be discharged to:: Private residence Living Arrangements: Alone Available Help at Discharge:  Family;Available PRN/intermittently   Home Access: Stairs to enter Entrance Stairs-Number of Steps: 3-4 tall steps with R ascending rail- steps are very tall and she has had trouble with this for awhile Entrance Stairs-Rails: Right Home Layout: Two level Alternate Level Stairs-Number of Steps: has a chair lift that she can ride up inside   [employer] Shower/Tub: Occupational psychologist: Handicapped height     Home Equipment: Shower seat;Cane - single point;Walker - 4 wheels;Hand held shower head;Grab bars - tub/shower          Prior Functioning/Environment Level of Independence: Independent        Comments: has DME from her late husband but does not really use it        OT Problem List: Decreased strength;Decreased activity tolerance;Impaired balance (sitting and/or standing);Cardiopulmonary status limiting activity;Pain;Increased edema      OT Treatment/Interventions: Self-care/ADL training;Therapeutic exercise;Energy conservation;DME and/or AE instruction;Balance training;Therapeutic activities    OT Goals(Current goals can be found in the care plan section) Acute Rehab OT Goals Patient Stated Goal: feel better, get stronger OT Goal Formulation: With patient Time For Goal Achievement: 06/23/20 Potential to Achieve Goals: Good ADL Goals Pt Will Perform Grooming: sitting;standing;with set-up Pt Will Perform Lower Body Bathing: with supervision;sit to/from stand Pt Will Perform Lower Body Dressing: with supervision;sit to/from stand Pt Will Transfer to Toilet: with modified independence;ambulating;regular height toilet Pt Will Perform Toileting - Clothing Manipulation and hygiene: with modified independence;sit to/from stand  OT Frequency: Min 2X/week   Barriers to D/C:    none noted       Co-evaluation              AM-PAC OT "6 Clicks" Daily Activity     Outcome Measure Help from another person eating meals?: None Help from another person taking  care of personal grooming?: A Little Help from another person toileting, which includes using toliet, bedpan, or urinal?: A Little Help from another person bathing (including washing, rinsing, drying)?: A Lot Help from another person to put on and taking off regular upper body clothing?: A Little Help from another person to put on and taking off regular lower body clothing?: A Lot 6 Click Score: 17   End of Session Equipment Utilized During Treatment: Rolling walker;Oxygen  Activity Tolerance: Patient tolerated treatment well Patient left: in bed;with call bell/phone within reach;with nursing/sitter in room  OT Visit Diagnosis: Unsteadiness on feet (R26.81);Muscle weakness (generalized) (M62.81);Dizziness and giddiness (R42);Pain                Time: 1203-1227 OT Time Calculation (min): 24 min Charges:  OT General Charges $OT Visit: 1 Visit OT Evaluation $OT Eval Moderate Complexity: 1 Mod OT Treatments $Self Care/Home Management : 8-22 mins  06/09/2020  Rich, OTR/L  Acute Rehabilitation Services  Office:  [place] 06/09/2020, 2:28 PM

## 2020-06-09 NOTE — Consult Note (Signed)
[facility]  HISTORY AND PHYSICAL  Cynthia Hansen is an 80 y.o. female.    Chief Complaint: SOB and weight gain  HPI: Pt is a 30F with a PMH sig for Afib, DM II, HTN, h/o CEA, and new dx of dilated cardiomyopathy with EF 30-35% who is now seen in consultation at the request of Dr. Aundra Dubin for eval and recs re: AKI on CKD.    Pt was admitted 06/02/2020 with increased SOB and 12 lb weight gain.  She had been struggling with volume status during the month of December.  Was diuresed pretty effectively but on 06/05/20 had a CODE blue with pulseless Vtach.  Got 4 min of CPR and 1 of epi, had ROSC.  BP was low after that and was started on Levophed which was quickly weaned off.  Wasn't cooled as she was back to her baseline MS quickly after the event.    She is still on midodrine now.  She has pre-existing CKD IV and was slated to see our practice for initial eval but hasn't been able to attend appt yet (got time mixed up one day).  Her husband used to be Dr [employer] pt and she wants to follow with him.     Cr trend as follows: 1.44 11/2018--> 1.8 08/19/2019--> 2.98 05/13/2020 --> 2.26 05/26/20 --> 2.26 05/27/20--> 2.03 06/04/20 --> 2.10 (CODE BLUE) --> 2.35 06/06/20--> 2.7 06/08/20 --> 2.86 06/09/20, prompting evaluation.    Has been placed on Farxiga since 06/04/20.  No NSAIDs, ACEi/ ARB/ spironolactone.    In this setting we are asked to see.   Pt notes no foamy/ frothy urine, no rashes.  Has neuropathy from DM II.  ICD planned for 06/11/20.  Says she sometimes has a feeling of incomplete emptying of her bladder. Had thora 06/08/20 with 700 cc out Had [place] 06/08/19- mildly elevated filling pressure, preserved CO   PMH: Past Medical History:  Diagnosis Date  . A-fib ([place])   . Acute on chronic combined systolic (congestive) and diastolic (congestive) heart failure ([place])   . Carotid artery occlusion   . DCM (dilated cardiomyopathy) ([place])    EF 30-35% by echo 05/2020  . Diabetes mellitus   .  Hyperlipidemia   . Irregular heart beat   . Vitamin D deficiency    PSH: Past Surgical History:  Procedure Laterality Date  . ABDOMINAL HERNIA REPAIR    . CAROTID ENDARTERECTOMY Left Dec. 3, 2008    cea  . CATARACT EXTRACTION Left   . HYSTEROSCOPY WITH D & C N/A 07/30/2014   Procedure: DILATATION AND CURETTAGE /HYSTEROSCOPY;  Surgeon: Osborne Oman, MD;  Location: [facility];  Service: Gynecology;  Laterality: N/A;  . IR THORACENTESIS ASP PLEURAL SPACE W/IMG GUIDE  06/08/2020  . IUD REMOVAL N/A 07/30/2014   Procedure: INTRAUTERINE DEVICE (IUD) REMOVAL;  Surgeon: Osborne Oman, MD;  Location: [facility];  Service: Gynecology;  Laterality: N/A;  . RIGHT HEART CATH N/A 06/07/2020   Procedure: RIGHT HEART CATH;  Surgeon: Larey Dresser, MD;  Location: [facility];  Service: Cardiovascular;  Laterality: N/A;     Past Medical History:  Diagnosis Date  . A-fib ([place])   . Acute on chronic combined systolic (congestive) and diastolic (congestive) heart failure ([place])   . Carotid artery occlusion   . DCM (dilated cardiomyopathy) ([place])    EF 30-35% by echo 05/2020  . Diabetes mellitus   . Hyperlipidemia   . Irregular heart beat   .  Vitamin D deficiency     Medications:   Scheduled: . acetaminophen  1,000 mg Oral TID  . amiodarone  200 mg Oral BID  . aspirin  81 mg Oral Daily  . atorvastatin  80 mg Oral Daily  . colestipol  1 g Oral BID  . dapagliflozin propanediol  10 mg Oral Daily  . insulin aspart  0-5 Units Subcutaneous QHS  . insulin aspart  0-9 Units Subcutaneous TID WC  . melatonin  3 mg Oral QHS  . mesalamine  4.8 g Oral Q breakfast  . methimazole  5 mg Oral Daily  . midodrine  2.5 mg Oral TID WC  . ondansetron (ZOFRAN) IV  4 mg Intravenous Once  . sodium chloride flush  3 mL Intravenous Q12H  . sodium chloride flush  3 mL Intravenous Q12H  . Warfarin - Pharmacist Dosing Inpatient   Does not apply q1600    Medications Prior to Admission  Medication Sig Dispense  Refill  . acetaminophen (TYLENOL) 500 MG tablet Take 1,000 mg by mouth every 6 (six) hours as needed for mild pain (For back pain.).    Marland Kitchen atorvastatin (LIPITOR) 20 MG tablet TAKE 1 TABLET(20 MG) BY MOUTH DAILY (Patient taking differently: Take 20 mg by mouth daily.) 90 tablet 2  . colestipol (COLESTID) 1 g tablet Take 1 g by mouth 2 (two) times daily.    Marland Kitchen diltiazem (CARDIZEM CD) 180 MG 24 hr capsule TAKE 1 CAPSULE BY MOUTH TWICE DAILY BEFORE MEALS (Patient taking differently: Take 180 mg by mouth in the morning and at bedtime.) 180 capsule 3  . diphenoxylate-atropine (LOMOTIL) 2.5-0.025 MG tablet Take 1 tablet by mouth 2 (two) times daily as needed.    . furosemide (LASIX) 40 MG tablet Take 1 tablet (40 mg total) by mouth 2 (two) times daily. 180 tablet 3  . glimepiride (AMARYL) 2 MG tablet Take 2 mg by mouth daily with breakfast.  1  . Liniments (SALONPAS ARTHRITIS PAIN RELIEF EX) Apply 1 application topically daily as needed (For lower back pain).    Marland Kitchen lisinopril (PRINIVIL,ZESTRIL) 5 MG tablet Take 2.5 mg by mouth daily.    . Vitamin D, Ergocalciferol, (DRISDOL) 1.25 MG (50000 UNIT) CAPS capsule Take 50,000 Units by mouth once a week.    . warfarin (COUMADIN) 1 MG tablet Take 1 tablet to 1 and 1/2 tablets by mouth daily as directed by the coumadin clinic. (Patient taking differently: Take 1-1.5 mg by mouth See admin instructions. Take 1 tablet by mouth every day except on Tuesdays take  1 and 1/2 tablets by mouth daily as directed by the coumadin clinic.) 40 tablet 0  . ONE TOUCH ULTRA TEST test strip 1 each by Other route as needed (for BG testing).   5    ALLERGIES:   Allergies  Allergen Reactions  . Avandia [Rosiglitazone] Other (See Comments)    Causes muscle weakness.  . Latex Rash  . Sulfa Antibiotics Rash    FAM HX: Family History  Problem Relation Age of Onset  . Cancer Mother        Uterin  . Heart attack Father   . Hypertension Sister     Social History:   reports that  she quit smoking about 3 years ago. Her smoking use included cigarettes. She quit after 50.00 years of use. She has never used smokeless tobacco. She reports current alcohol use. She reports that she does not use drugs.  ROS: ROS: all other systems reviewed and and  are negative except per HPI  Blood pressure 113/61, pulse 73, temperature (!) 97.5 F (36.4 C), temperature source Oral, resp. rate 17, height 5\' 8"  (1.727 m), weight 78.7 kg, SpO2 98 %. PHYSICAL EXAM: Physical Exam  GEN NAD, sitting up in bed HEENT EOMI PERRL NECK + JVD, L sided CEA scar PULM clear CV irregular ABD soft, nontender EXT trace LE edema NEURO AAO x 3 SKIN multiple skin tears and bruises   Results for orders placed or performed during the hospital encounter of 06/02/20 (from the past 48 hour(s))  Glucose, capillary     Status: Abnormal   Collection Time: 06/07/20  5:40 PM  Result Value Ref Range   Glucose-Capillary 112 (H) 70 - 99 mg/dL    Comment: Glucose reference range applies only to samples taken after fasting for at least 8 hours.  Glucose, capillary     Status: Abnormal   Collection Time: 06/07/20  9:36 PM  Result Value Ref Range   Glucose-Capillary 150 (H) 70 - 99 mg/dL    Comment: Glucose reference range applies only to samples taken after fasting for at least 8 hours.  Protime-INR     Status: Abnormal   Collection Time: 06/08/20  1:30 AM  Result Value Ref Range   Prothrombin Time 22.6 (H) 11.4 - 15.2 seconds   INR 2.1 (H) 0.8 - 1.2    Comment: (NOTE) INR goal varies based on device and disease states. Performed at [facility], [place] [address] [place], [place] [address]   CBC     Status: Abnormal   Collection Time: 06/08/20  1:30 AM  Result Value Ref Range   WBC 7.4 4.0 - 10.5 K/uL   RBC 3.05 (L) 3.87 - 5.11 MIL/uL   Hemoglobin 9.0 (L) 12.0 - 15.0 g/dL   HCT 28.7 (L) 36.0 - 46.0 %   MCV 94.1 80.0 - 100.0 fL   MCH 29.5 26.0 - 34.0 pg   MCHC 31.4 30.0 - 36.0 g/dL   RDW 14.4 11.5  - 15.5 %   Platelets 229 150 - 400 K/uL   nRBC 0.0 0.0 - 0.2 %    Comment: Performed at [facility], [place] [address] [place], [place] [address]  Magnesium     Status: None   Collection Time: 06/08/20  1:30 AM  Result Value Ref Range   Magnesium 2.1 1.7 - 2.4 mg/dL    Comment: Performed at [facility], [place] [address], [place],  [address]  Basic metabolic panel     Status: Abnormal   Collection Time: 06/08/20  1:30 AM  Result Value Ref Range   Sodium 136 135 - 145 mmol/L   Potassium 4.1 3.5 - 5.1 mmol/L   Chloride 98 98 - 111 mmol/L   CO2 21 (L) 22 - 32 mmol/L   Glucose, Bld 143 (H) 70 - 99 mg/dL    Comment: Glucose reference range applies only to samples taken after fasting for at least 8 hours.   BUN 61 (H) 8 - 23 mg/dL   Creatinine, Ser 2.70 (H) 0.44 - 1.00 mg/dL   Calcium 8.7 (L) 8.9 - 10.3 mg/dL   GFR, Estimated 18 (L) >60 mL/min    Comment: (NOTE) Calculated using the CKD-EPI Creatinine Equation (2021)    Anion gap 17 (H) 5 - 15    Comment: Performed at [facility] [address], [place], [place] [address]  Glucose, capillary     Status: Abnormal  Collection Time: 06/08/20  6:06 AM  Result Value Ref Range   Glucose-Capillary 131 (H) 70 - 99 mg/dL    Comment: Glucose reference range applies only to samples taken after fasting for at least 8 hours.  Lactate dehydrogenase (pleural or peritoneal fluid)     Status: Abnormal   Collection Time: 06/08/20  9:43 AM  Result Value Ref Range   LD, Fluid 78 (H) 3 - 23 U/L    Comment: (NOTE) Results should be evaluated in conjunction with serum values    Fluid Type-FLDH Lung, Right Pleural     Comment: Performed at [facility] [address], [place], [place] [address] CORRECTED ON 01/04 AT 1136: PREVIOUSLY REPORTED AS Lung, Right   Body fluid cell count with differential     Status: Abnormal   Collection Time: 06/08/20  9:43 AM  Result Value Ref Range   Fluid Type-FCT Lung, Right Pleural      Comment: CORRECTED ON 01/04 AT 1136: PREVIOUSLY REPORTED AS Lung, Right   Color, Fluid YELLOW (A) YELLOW   Appearance, Fluid HAZY (A) CLEAR   Total Nucleated Cell Count, Fluid 240 0 - 1,000 cu mm   Neutrophil Count, Fluid 6 0 - 25 %    Comment: MESOTHELIAL CELLS PRESENT    Lymphs, Fluid 40 %   Monocyte-Macrophage-Serous Fluid 54 50 - 90 %   Eos, Fluid 0 %    Comment: Performed at [facility], [place] [address] [place], [place] [address]  Body fluid culture     Status: None (Preliminary result)   Collection Time: 06/08/20  9:43 AM   Specimen: Lung, Right; Pleural Fluid  Result Value Ref Range   Specimen Description LUNG RIGHT    Special Requests PLEURAL FLD    Gram Stain      WBC PRESENT,BOTH PMN AND MONONUCLEAR NO ORGANISMS SEEN CYTOSPIN SMEAR    Culture      NO GROWTH < 24 HOURS Performed at [facility], [place] [address], [place], [place] [address]    Report Status PENDING   Albumin, pleural or peritoneal fluid     Status: None   Collection Time: 06/08/20  9:43 AM  Result Value Ref Range   Albumin, Fluid 1.8 g/dL    Comment: (NOTE) No normal range established for this test Results should be evaluated in conjunction with serum values    Fluid Type-FALB Lung, Right Pleural     Comment: Performed at [facility] [address] [place], [place] [address] CORRECTED ON 01/04 AT 1136: PREVIOUSLY REPORTED AS Lung, Right   Protein, pleural or peritoneal fluid     Status: None   Collection Time: 06/08/20  9:43 AM  Result Value Ref Range   Total protein, fluid <3.0 g/dL    Comment: RESULT REPEATED AND VERIFIED (NOTE) No normal range established for this test Results should be evaluated in conjunction with serum values    Fluid Type-FTP Lung, Right Pleural     Comment: Performed at [facility], [place] [address] [place], [place] [address] CORRECTED ON 01/04 AT 1136: PREVIOUSLY REPORTED AS Lung, Right   Hepatic function panel     Status: Abnormal    Collection Time: 06/08/20 11:12 AM  Result Value Ref Range   Total Protein 5.9 (L) 6.5 - 8.1 g/dL   Albumin 3.0 (L) 3.5 - 5.0 g/dL   AST 21 15 - 41 U/L   ALT 22 0 - 44 U/L  Alkaline Phosphatase 57 38 - 126 U/L   Total Bilirubin 0.6 0.3 - 1.2 mg/dL   Bilirubin, Direct <0.1 0.0 - 0.2 mg/dL   Indirect Bilirubin NOT CALCULATED 0.3 - 0.9 mg/dL    Comment: Performed at [facility] [address] [place], [place] [address]  Lactate dehydrogenase     Status: Abnormal   Collection Time: 06/08/20 11:12 AM  Result Value Ref Range   LDH 235 (H) 98 - 192 U/L    Comment: Performed at [facility], [place] [address], [place], [place] [address]  Glucose, capillary     Status: Abnormal   Collection Time: 06/08/20 12:08 PM  Result Value Ref Range   Glucose-Capillary 156 (H) 70 - 99 mg/dL    Comment: Glucose reference range applies only to samples taken after fasting for at least 8 hours.  Glucose, capillary     Status: Abnormal   Collection Time: 06/08/20  4:51 PM  Result Value Ref Range   Glucose-Capillary 168 (H) 70 - 99 mg/dL    Comment: Glucose reference range applies only to samples taken after fasting for at least 8 hours.  Glucose, capillary     Status: Abnormal   Collection Time: 06/08/20  9:27 PM  Result Value Ref Range   Glucose-Capillary 206 (H) 70 - 99 mg/dL    Comment: Glucose reference range applies only to samples taken after fasting for at least 8 hours.   Comment 1 Notify RN    Comment 2 Document in Chart   Protime-INR     Status: Abnormal   Collection Time: 06/09/20 12:17 AM  Result Value Ref Range   Prothrombin Time 34.3 (H) 11.4 - 15.2 seconds   INR 3.5 (H) 0.8 - 1.2    Comment: (NOTE) INR goal varies based on device and disease states. Performed at [facility], [place] [address] [place], [place] [address]   CBC     Status: Abnormal   Collection Time: 06/09/20 12:17 AM  Result Value Ref Range   WBC 7.8 4.0 - 10.5 K/uL   RBC 2.96 (L) 3.87 - 5.11  MIL/uL   Hemoglobin 8.6 (L) 12.0 - 15.0 g/dL   HCT 27.9 (L) 36.0 - 46.0 %   MCV 94.3 80.0 - 100.0 fL   MCH 29.1 26.0 - 34.0 pg   MCHC 30.8 30.0 - 36.0 g/dL   RDW 14.2 11.5 - 15.5 %   Platelets 227 150 - 400 K/uL   nRBC 0.0 0.0 - 0.2 %    Comment: Performed at [facility], [place] [address] [place], [place] [address]  Magnesium     Status: None   Collection Time: 06/09/20 12:17 AM  Result Value Ref Range   Magnesium 2.2 1.7 - 2.4 mg/dL    Comment: Performed at [facility] [address], [place],  [address]  Basic metabolic panel     Status: Abnormal   Collection Time: 06/09/20 12:17 AM  Result Value Ref Range   Sodium 135 135 - 145 mmol/L   Potassium 3.9 3.5 - 5.1 mmol/L   Chloride 99 98 - 111 mmol/L   CO2 23 22 - 32 mmol/L   Glucose, Bld 139 (H) 70 - 99 mg/dL    Comment: Glucose reference range applies only to samples taken after fasting for at least 8 hours.   BUN 69 (H) 8 - 23 mg/dL   Creatinine, Ser 2.86 (H) 0.44 - 1.00 mg/dL   Calcium  8.5 (L) 8.9 - 10.3 mg/dL   GFR, Estimated 16 (L) >60 mL/min    Comment: (NOTE) Calculated using the CKD-EPI Creatinine Equation (2021)    Anion gap 13 5 - 15    Comment: Performed at [facility], [place] [address], [place], [place] [address]  Glucose, capillary     Status: Abnormal   Collection Time: 06/09/20  6:24 AM  Result Value Ref Range   Glucose-Capillary 104 (H) 70 - 99 mg/dL    Comment: Glucose reference range applies only to samples taken after fasting for at least 8 hours.   Comment 1 Notify RN    Comment 2 Document in Chart   Glucose, capillary     Status: Abnormal   Collection Time: 06/09/20 11:29 AM  Result Value Ref Range   Glucose-Capillary 170 (H) 70 - 99 mg/dL    Comment: Glucose reference range applies only to samples taken after fasting for at least 8 hours.  Basic metabolic panel     Status: Abnormal   Collection Time: 06/09/20  1:21 PM  Result Value Ref Range   Sodium 133 (L) 135 - 145  mmol/L   Potassium 3.9 3.5 - 5.1 mmol/L   Chloride 100 98 - 111 mmol/L   CO2 20 (L) 22 - 32 mmol/L   Glucose, Bld 206 (H) 70 - 99 mg/dL    Comment: Glucose reference range applies only to samples taken after fasting for at least 8 hours.   BUN 70 (H) 8 - 23 mg/dL   Creatinine, Ser 2.87 (H) 0.44 - 1.00 mg/dL   Calcium 8.6 (L) 8.9 - 10.3 mg/dL   GFR, Estimated 16 (L) >60 mL/min    Comment: (NOTE) Calculated using the CKD-EPI Creatinine Equation (2021)    Anion gap 13 5 - 15    Comment: Performed at [facility] [address], [place], [place] [address]  Hemoglobin and hematocrit, blood     Status: Abnormal   Collection Time: 06/09/20  1:21 PM  Result Value Ref Range   Hemoglobin 8.8 (L) 12.0 - 15.0 g/dL   HCT 27.1 (L) 36.0 - 46.0 %    Comment: Performed at [facility], [place] [address], [place], [place] [address]  Glucose, capillary     Status: Abnormal   Collection Time: 06/09/20  4:05 PM  Result Value Ref Range   Glucose-Capillary 206 (H) 70 - 99 mg/dL    Comment: Glucose reference range applies only to samples taken after fasting for at least 8 hours.   Comment 1 Notify RN     DG Chest 1 View  Result Date: 06/08/2020 CLINICAL DATA:  Post right thoracentesis EXAM: CHEST  1 VIEW COMPARISON:  06/05/2020 FINDINGS: Decreased right pleural effusion following thoracentesis. No pneumothorax. Improved aeration in the right lung base with persistent airspace disease. Small left pleural effusion.  Negative for pulmonary edema. IMPRESSION: No complication post right thoracentesis. Improved aeration right lower lobe. Electronically Signed   By: Franchot Gallo M.D.   On: 06/08/2020 10:33   DG Chest Right Decubitus  Result Date: 06/08/2020 CLINICAL DATA:  Pleural effusion EXAM: CHEST - RIGHT DECUBITUS COMPARISON:  06/05/2020 FINDINGS: Following right thoracentesis, right lateral decubitus view was obtained. Small right pleural effusions present. Right lower lobe airspace disease is  present. Left lung appears clear. IMPRESSION: Small layering right effusion. Mild right lower lobe airspace disease. Electronically Signed   By: Franchot Gallo M.D.   On: 06/08/2020 10:35   IR  THORACENTESIS ASP PLEURAL SPACE W/IMG GUIDE  Result Date: 06/08/2020 INDICATION: Patient with recent pulseless VT requiring shock and CPR; history of persistent atrial fibrillation. Patient has now developed pleural effusions. Interventional Radiology asked to perform a diagnostic and therapeutic thoracentesis. EXAM: ULTRASOUND GUIDED THORACENTESIS MEDICATIONS: 1% lidocaine 20 mL COMPLICATIONS: None immediate.  No pneumothorax on follow-up radiograph. PROCEDURE: An ultrasound guided thoracentesis was thoroughly discussed with the patient and questions answered. The benefits, risks, alternatives and complications were also discussed. The patient understands and wishes to proceed with the procedure. Written consent was obtained. Ultrasound was performed to localize and mark an adequate pocket of fluid in the right chest. The area was then prepped and draped in the normal sterile fashion. 1% Lidocaine was used for local anesthesia. Under ultrasound guidance a 6 Fr Safe-T-Centesis catheter was introduced. Thoracentesis was performed. The catheter was removed and a dressing applied. FINDINGS: A total of approximately 700 mL of clear yellow fluid was removed. Samples were sent to the laboratory as requested by the clinical team. IMPRESSION: Successful ultrasound guided right thoracentesis yielding 700 mL of pleural fluid. Read by: Soyla Dryer, NP Electronically Signed   By: Lucrezia Europe M.D.   On: 06/08/2020 11:30    Assessment/Plan  1.  AKI on CKD IV: will do basic workup for CKD IV including UA, UP/C, renal US, SPEP and go from there.  No GN signs/ symptoms so will see if serologies warranted by UA.   - AKI component likely d/t hemodynamic effects of pulseless Vtach; TGF regulation from [place] also possibly  contributing.   - low suspicion for AIN - on midodrine to augment BP for now--> BP a little soft today, increase to 5 TID and follow - Lasix has been held - ? If she has some urinary retention, renal US will show this  2.  Acute systolic CHF exacerbation: EF 35-40% with TTE 05/14/2020.   - on Farxiga  - Lasix has been held - AHF following - to get ICD 06/12/19  3.  Pulseless Vtach - 4 min, ROSC after 4 min  4.  Afib - warfarin  5.  H/o hyperkalemia: - monitor for now, normokalemic   Cynthia Hansen 06/09/2020, 4:39 PM

## 2020-06-10 DIAGNOSIS — I509 Heart failure, unspecified: Secondary | ICD-10-CM

## 2020-06-10 DIAGNOSIS — N179 Acute kidney failure, unspecified: Secondary | ICD-10-CM | POA: Diagnosis not present

## 2020-06-10 LAB — PROTIME-INR
INR: 3.8 — ABNORMAL HIGH (ref 0.8–1.2)
Prothrombin Time: 36.3 seconds — ABNORMAL HIGH (ref 11.4–15.2)

## 2020-06-10 LAB — CBC
HCT: 27.8 % — ABNORMAL LOW (ref 36.0–46.0)
Hemoglobin: 8.6 g/dL — ABNORMAL LOW (ref 12.0–15.0)
MCH: 29.2 pg (ref 26.0–34.0)
MCHC: 30.9 g/dL (ref 30.0–36.0)
MCV: 94.2 fL (ref 80.0–100.0)
Platelets: 226 10*3/uL (ref 150–400)
RBC: 2.95 MIL/uL — ABNORMAL LOW (ref 3.87–5.11)
RDW: 14.1 % (ref 11.5–15.5)
WBC: 7.6 10*3/uL (ref 4.0–10.5)
nRBC: 0 % (ref 0.0–0.2)

## 2020-06-10 LAB — URINALYSIS, ROUTINE W REFLEX MICROSCOPIC
Bacteria, UA: NONE SEEN
Bilirubin Urine: NEGATIVE
Glucose, UA: >=500 mg/dL — AB
Hgb urine dipstick: NEGATIVE
Ketones, ur: NEGATIVE mg/dL
Leukocytes,Ua: NEGATIVE
Nitrite: NEGATIVE
Protein, ur: NEGATIVE mg/dL
Specific Gravity, Urine: 1.018 (ref 1.005–1.030)
pH: 5 (ref 5.0–8.0)

## 2020-06-10 LAB — BASIC METABOLIC PANEL
Anion gap: 12 (ref 5–15)
BUN: 74 mg/dL — ABNORMAL HIGH (ref 8–23)
CO2: 19 mmol/L — ABNORMAL LOW (ref 22–32)
Calcium: 8 mg/dL — ABNORMAL LOW (ref 8.9–10.3)
Chloride: 95 mmol/L — ABNORMAL LOW (ref 98–111)
Creatinine, Ser: 2.94 mg/dL — ABNORMAL HIGH (ref 0.44–1.00)
GFR, Estimated: 16 mL/min — ABNORMAL LOW (ref >60)
Glucose, Bld: 173 mg/dL — ABNORMAL HIGH (ref 70–99)
Potassium: 3.8 mmol/L (ref 3.5–5.1)
Sodium: 126 mmol/L — ABNORMAL LOW (ref 135–145)

## 2020-06-10 LAB — RENAL FUNCTION PANEL
Albumin: 3.2 g/dL — ABNORMAL LOW (ref 3.5–5.0)
Anion gap: 15 (ref 5–15)
BUN: 74 mg/dL — ABNORMAL HIGH (ref 8–23)
CO2: 20 mmol/L — ABNORMAL LOW (ref 22–32)
Calcium: 8.8 mg/dL — ABNORMAL LOW (ref 8.9–10.3)
Chloride: 99 mmol/L (ref 98–111)
Creatinine, Ser: 2.84 mg/dL — ABNORMAL HIGH (ref 0.44–1.00)
GFR, Estimated: 16 mL/min — ABNORMAL LOW (ref >60)
Glucose, Bld: 155 mg/dL — ABNORMAL HIGH (ref 70–99)
Phosphorus: 5.3 mg/dL — ABNORMAL HIGH (ref 2.5–4.6)
Potassium: 4.2 mmol/L (ref 3.5–5.1)
Sodium: 134 mmol/L — ABNORMAL LOW (ref 135–145)

## 2020-06-10 LAB — GLUCOSE, CAPILLARY
Glucose-Capillary: 135 mg/dL — ABNORMAL HIGH (ref 70–99)
Glucose-Capillary: 176 mg/dL — ABNORMAL HIGH (ref 70–99)
Glucose-Capillary: 233 mg/dL — ABNORMAL HIGH (ref 70–99)
Glucose-Capillary: 158 mg/dL — ABNORMAL HIGH (ref 70–99)

## 2020-06-10 LAB — RETICULOCYTES
Immature Retic Fract: 27.6 % — ABNORMAL HIGH (ref 2.3–15.9)
RBC.: 3.09 MIL/uL — ABNORMAL LOW (ref 3.87–5.11)
Retic Count, Absolute: 69.5 10*3/uL (ref 19.0–186.0)
Retic Ct Pct: 2.3 % (ref 0.4–3.1)

## 2020-06-10 LAB — OSMOLALITY, URINE: Osmolality, Ur: 435 mOsm/kg (ref 300–900)

## 2020-06-10 LAB — IRON AND TIBC
Iron: 20 ug/dL — ABNORMAL LOW (ref 28–170)
Saturation Ratios: 5 % — ABNORMAL LOW (ref 10.4–31.8)
TIBC: 420 ug/dL (ref 250–450)
UIBC: 400 ug/dL

## 2020-06-10 LAB — VITAMIN B12: Vitamin B-12: 970 pg/mL — ABNORMAL HIGH (ref 180–914)

## 2020-06-10 LAB — FOLATE: Folate: 8.7 ng/mL (ref >5.9)

## 2020-06-10 LAB — OSMOLALITY: Osmolality: 310 mOsm/kg — ABNORMAL HIGH (ref 275–295)

## 2020-06-10 LAB — PROTEIN / CREATININE RATIO, URINE
Creatinine, Urine: 117.58 mg/dL
Protein Creatinine Ratio: 0.1 mg/mg{Cre} (ref 0.00–0.15)
Total Protein, Urine: 12 mg/dL

## 2020-06-10 LAB — CYTOLOGY - NON PAP

## 2020-06-10 LAB — SODIUM, URINE, RANDOM: Sodium, Ur: 13 mmol/L

## 2020-06-10 LAB — FERRITIN: Ferritin: 20 ng/mL (ref 11–307)

## 2020-06-10 LAB — MAGNESIUM: Magnesium: 2.3 mg/dL (ref 1.7–2.4)

## 2020-06-10 MED ORDER — HYDROCORTISONE 1 % EX CREA
TOPICAL_CREAM | Freq: Four times a day (QID) | CUTANEOUS | Status: DC
  Administered 2020-06-10 – 2020-06-12 (×3): 1 via TOPICAL
  Filled 2020-06-10: qty 28

## 2020-06-10 MED ORDER — NITROGLYCERIN 0.4 MG SL SUBL
SUBLINGUAL_TABLET | SUBLINGUAL | Status: AC
  Administered 2020-06-10: 0.4 mg
  Filled 2020-06-10: qty 1

## 2020-06-10 MED ORDER — ENSURE ENLIVE PO LIQD
237.0000 mL | Freq: Two times a day (BID) | ORAL | Status: AC
  Administered 2020-06-10 (×2): 237 mL via ORAL

## 2020-06-10 MED ORDER — SODIUM CHLORIDE 0.9 % IV SOLN
510.0000 mg | INTRAVENOUS | Status: DC
  Administered 2020-06-10: 510 mg via INTRAVENOUS
  Filled 2020-06-10: qty 17

## 2020-06-10 NOTE — Progress Notes (Signed)
CARDIAC REHAB PHASE I   Returned to offer to walk with pt. PT going to work with shortly. Will f/u tomorrow.  Rufina Falco, RN BSN 06/10/2020 2:05 PM

## 2020-06-10 NOTE — Progress Notes (Signed)
[facility] Progress Note   Assessment/ Plan:   1. AKI on CKD Stage IV:  -   No hydronephrosis on renal ultrasound or evidence of obstruction.  -   SBP improved with morning dose of midodrine. Continue Midodrine 5 TID -   P/C ration nl, UA doesn't suggest AIN. This is likely progression of her CKD from suspected to be from T2DM and HTN .  -   No urine output 1/4/-1/5 on chart. 400cc out 1/6. Lasix on hold.  -   Hold Farxiga  2.Hyponatremia - Patient experiencing nausea and decrease intake, could represent hypovolemic hyponatremia. Holding Lasix and exam suggest hypervolemic hyponatremia. Will order sodium studies to better determine.  3. Acute on chronic systolic heart failure -AHF following - Lasix on hold  - Plan for ICD 06/12/19 4. Anemia: Appears recent baseline hemoglobin has been 9.5-10.5. Normocytic anemia, Iron studies  - low iron, low ferritin, TIBC upper limit of nl, Fe/TIBC ~5%, calculated RI 1% - hypoproliferative. I interpret the studies and given history anemia is likely from CKD and IDA. FOBT pending as source for IDA.  Colonoscopy in 2018 show diverticula, nl otherwise.  5. CKD-MBD: Calcium corrects to nl based on recent albumin.  6. Nutrition: Low sodium diet   Subjective:     Chest wall pain, and uncomfortable from hemorrroids this morning. Experiencing nausea .    Objective:   BP 128/73 (BP Location: Right Arm)   Pulse (!) 105   Temp 97.8 F (36.6 C) (Oral)   Resp 20   Ht 5\' 8"  (1.727 m)   Wt 78.5 kg   SpO2 92%   BMI 26.31 kg/m   Physical Exam: Gen: Pleasant elderly women, well kept CVS: normal rate, irregular rhythm, no murmurs or rubs . Hepatojugular reflex Resp: normal work of breathing, coarse rales at right base, no wheezing or rhonchi Abd: Soft, non distended Ext: warm, no edema  Labs: BMET Recent Labs  Lab 06/06/20 0315 06/06/20 2252 06/07/20 0611 06/07/20 0826 06/08/20 0130 06/09/20 0017 06/09/20 1321 06/10/20 0044  NA  138 137 136 138  138 136 135 133* 126*  K 5.0 3.9 4.0 4.1  4.1 4.1 3.9 3.9 3.8  CL 103 100 102  --  98 99 100 95*  CO2 19* 22 21*  --  21* 23 20* 19*  GLUCOSE 175* 136* 120*  --  143* 139* 206* 173*  BUN 59* 55* 53*  --  61* 69* 70* 74*  CREATININE 2.36* 2.35* 2.34*  --  2.70* 2.86* 2.87* 2.94*  CALCIUM 8.7* 8.5* 8.7*  --  8.7* 8.5* 8.6* 8.0*   CBC Recent Labs  Lab 06/07/20 0611 06/07/20 0826 06/08/20 0130 06/09/20 0017 06/09/20 1321 06/10/20 0044  WBC 10.0  --  7.4 7.8  --  7.6  HGB 9.7*   < > 9.0* 8.6* 8.8* 8.6*  HCT 30.4*   < > 28.7* 27.9* 27.1* 27.8*  MCV 94.4  --  94.1 94.3  --  94.2  PLT 263  --  229 227  --  226   < > = values in this interval not displayed.    Intake/Output Summary (Last 24 hours) at 06/10/2020 0905 Last data filed at 06/10/2020 0814 Gross per 24 hour  Intake 462.76 ml  Output -  Net 462.76 ml     Medications:    . acetaminophen  1,000 mg Oral TID  . amiodarone  200 mg Oral BID  . aspirin  81 mg Oral Daily  .  atorvastatin  80 mg Oral Daily  . colestipol  1 g Oral BID  . dapagliflozin propanediol  10 mg Oral Daily  . insulin aspart  0-5 Units Subcutaneous QHS  . insulin aspart  0-9 Units Subcutaneous TID WC  . melatonin  3 mg Oral QHS  . mesalamine  4.8 g Oral Q breakfast  . methimazole  5 mg Oral Daily  . midodrine  5 mg Oral TID WC  . ondansetron (ZOFRAN) IV  4 mg Intravenous Once  . sodium chloride flush  3 mL Intravenous Q12H  . sodium chloride flush  3 mL Intravenous Q12H  . Warfarin - Pharmacist Dosing Inpatient   Does not apply q1600     Tamsen Snider, MD PGY2 IM 06/10/2020, 8:00 AM   See attending attestation for final recommendations

## 2020-06-10 NOTE — Progress Notes (Signed)
CARDIAC REHAB PHASE I   Went to offer to walk with pt, pt sitting in recliner. Pt declining at this time due to nausea and recent emesis. Pt c/o sore bottom, donut pillow requested to RN.  Rufina Falco, RN BSN 06/10/2020 9:45 AM

## 2020-06-10 NOTE — Progress Notes (Addendum)
Patient ID: Cynthia Hansen, female   DOB: 02/14/1941, 80 y.o.   MRN: [id number]     Advanced Heart Failure Rounding Note  PCP-Cardiologist: Larae Grooms, MD   Subjective:    06/05/20  patient had pulseless VT requiring shock x 1 and CPR x 4 minutes.  She had rapid recovery and was not intubated.  Electrolytes stable.  Amiodarone gtt started.  Due to low BP, she was started on norepinephrine 4 peripherally.  Creatinine rose 2.1 => 2.36 and HS-TnI rose to 7990.  Currently, only mild chest wall tenderness post-CPR.   06/08/19 RHC demonstrated mildly elevated filling pressures and relatively preserved CO. No LHC due to abnormal renal function. cMRI limited study. Very difficult images due to respiratory artifact and atrial fibrillation.  06/09/19 S/P Thoracentesis on R. 700 cc removed.   Renal function worse, SCr 2.87>>2.94. BUN up to 74  Na low 126   Diuretics on hold. Wt stable at 173 lb. She has been making urine but no output charted yesterday.   Remains in rate controlled Afib. No further VT.   Only complaint today is pleuritic CP and chest wall soreness from chest compressions. No dyspnea.    Objective:   Weight Range: 78.5 kg Body mass index is 26.31 kg/m.   Vital Signs:   Temp:  [97.5 F (36.4 C)-98.5 F (36.9 C)] 97.8 F (36.6 C) (01/06 0740) Pulse Rate:  [71-105] 105 (01/06 0344) Resp:  [16-20] 20 (01/06 0740) BP: (100-128)/(52-73) 128/73 (01/06 0740) SpO2:  [92 %-97 %] 92 % (01/06 0344) Weight:  [78.5 kg] 78.5 kg (01/06 0630) Last BM Date: 06/05/20  Weight change: Filed Weights   06/08/20 0620 06/09/20 0455 06/10/20 0630  Weight: 79.1 kg 78.7 kg 78.5 kg    Intake/Output:   Intake/Output Summary (Last 24 hours) at 06/10/2020 0800 Last data filed at 06/10/2020 9449 Gross per 24 hour  Intake 582.76 ml  Output -  Net 582.76 ml      Physical Exam   General:  Well appearing. No respiratory difficulty HEENT: normal Neck: supple. JVD 8 cm. Carotids 2+ bilat; no  bruits. No lymphadenopathy or thyromegaly appreciated. Cor: PMI nondisplaced. Regular rate & rhythm. No rubs, gallops or murmurs. Lungs: faint bibasilar crackles  Abdomen: soft, nontender, nondistended. No hepatosplenomegaly. No bruits or masses. Good bowel sounds. Extremities: no cyanosis, clubbing, rash, edema Neuro: alert & oriented x 3, cranial nerves grossly intact. moves all 4 extremities w/o difficulty. Affect pleasant.   Telemetry    A Fib 70-80s. No further VT    Labs    CBC Recent Labs    06/09/20 0017 06/09/20 1321 06/10/20 0044  WBC 7.8  --  7.6  HGB 8.6* 8.8* 8.6*  HCT 27.9* 27.1* 27.8*  MCV 94.3  --  94.2  PLT 227  --  675   Basic Metabolic Panel Recent Labs    06/09/20 0017 06/09/20 1321 06/10/20 0044  NA 135 133* 126*  K 3.9 3.9 3.8  CL 99 100 95*  CO2 23 20* 19*  GLUCOSE 139* 206* 173*  BUN 69* 70* 74*  CREATININE 2.86* 2.87* 2.94*  CALCIUM 8.5* 8.6* 8.0*  MG 2.2  --  2.3   Liver Function Tests Recent Labs    06/08/20 1112  AST 21  ALT 22  ALKPHOS 57  BILITOT 0.6  PROT 5.9*  ALBUMIN 3.0*   No results for input(s): LIPASE, AMYLASE in the last 72 hours. Cardiac Enzymes No results for input(s): CKTOTAL, CKMB, CKMBINDEX, TROPONINI  in the last 72 hours.  BNP: BNP (last 3 results) Recent Labs    05/27/20 0828 06/02/20 1832  BNP 2,213.1* 2,338.9*    ProBNP (last 3 results) Recent Labs    05/13/20 1550 05/31/20 0939  PROBNP 46,357* 48,555*     D-Dimer No results for input(s): DDIMER in the last 72 hours. Hemoglobin A1C No results for input(s): HGBA1C in the last 72 hours. Fasting Lipid Panel No results for input(s): CHOL, HDL, LDLCALC, TRIG, CHOLHDL, LDLDIRECT in the last 72 hours. Thyroid Function Tests No results for input(s): TSH, T4TOTAL, T3FREE, THYROIDAB in the last 72 hours.  Invalid input(s): FREET3  Other results:   Imaging    US RENAL  Result Date: 06/09/2020 CLINICAL DATA:  80 year old female with acute  renal insufficiency. EXAM: RENAL / URINARY TRACT ULTRASOUND COMPLETE COMPARISON:  None. FINDINGS: Right Kidney: Renal measurements: 9.9 x 4.6 x 4.8 cm = volume: 113 mL. Moderate parenchyma atrophy. Normal echogenicity. No hydronephrosis or shadowing stone. Left Kidney: Renal measurements: 9.2 x 3.7 x 3.9 cm = volume: 70 mL. The left kidney is atrophic and poorly visualized. There is increased renal parenchyma atrophy. Evaluation of the kidney is very limited due to poor visualization. Bladder: Appears normal for degree of bladder distention. Other: None. IMPRESSION: 1. Moderate right renal parenchyma atrophy. No hydronephrosis or shadowing stone. 2. Atrophic and poorly visualized left kidney. Electronically Signed   By: Anner Crete M.D.   On: 06/09/2020 18:29     Medications:     Scheduled Medications: . acetaminophen  1,000 mg Oral TID  . amiodarone  200 mg Oral BID  . aspirin  81 mg Oral Daily  . atorvastatin  80 mg Oral Daily  . colestipol  1 g Oral BID  . dapagliflozin propanediol  10 mg Oral Daily  . insulin aspart  0-5 Units Subcutaneous QHS  . insulin aspart  0-9 Units Subcutaneous TID WC  . melatonin  3 mg Oral QHS  . mesalamine  4.8 g Oral Q breakfast  . methimazole  5 mg Oral Daily  . midodrine  5 mg Oral TID WC  . ondansetron (ZOFRAN) IV  4 mg Intravenous Once  . sodium chloride flush  3 mL Intravenous Q12H  . sodium chloride flush  3 mL Intravenous Q12H  . Warfarin - Pharmacist Dosing Inpatient   Does not apply q1600    Infusions: . sodium chloride Stopped (06/09/20 2219)  . sodium chloride    . cefTRIAXone (ROCEPHIN)  IV Stopped (06/09/20 2204)    PRN Medications: sodium chloride, acetaminophen, ondansetron (ZOFRAN) IV, sodium chloride flush   Assessment/Plan   1. Acute on chronic systolic CHF: Cardiolite in 11/21 with EF 26%, inferior fixed perfusion defect with no ischemia.  Echo in 12/21 with EF 35-40%, moderate LV dilation, mildly decreased RV function.  Cause of cardiomyopathy uncertain but there is concern for ischemic cardiomyopathy. Cardiac MRI was difficult study, EF 25-30% estimated without definitive LGE.  Worsening CHF recently complicated by CKD stage IV. She has had NYHA class III symptoms.  She diuresed well initially but had VT arrest .   - RHC this admit w/ mildly elevated filling pressures, mild pulmonary HTN and relatively preserved CO.  - Volume status stable. No diuretic today.   - Coreg on hold for hypotension  - Continue dapagliflozin 10 mg daily. ? Continuation given worsening renal function (GFR 16).  - Continue midodrine 5 mg tid  - Will be limited in terms of HF meds we can  use with CKD 4, history of hyperkalemia, and soft BP.  For now, no ARNI/ARB/digoxin/spironolactone.   - Will be Verquvo candidate. - Ideally, would have coronary angiography to look for CAD as cause of cardiomyopathy and VT.  However, creatinine is up today to 2.9 2. CKD: Stage IV.  Creatinine higher at 2.9 today.  Has had recent hyperkalemia, now improved.   - No diuretics.  - RHC  showed relatively preserved cardiac output.  - Continue to hold  blocker to allow increased renal perfusion. Aim to keep SBP > 110  - Continue Midodrine - Nephrology following and conducting w/u. Appreciate their assistance  3. Chronic atrial fibrillation: This appears permanent, x years.  Has biatrial severe enlargement and unlikely to hold NSR with DCCV attempt.  - Rate controlled.   - INR 3.8 today  - Hold Coumadin for planned ICD  4. Low TSH: Concern for hyperthyroidism.  Free T4 is high, but free T3 is normal. Thyroid stimulating antibody negative  - Discussed abnormal TFTs with endocrinology, started on  low dose methimazole 5 mg qam. 5. CAD: NSTEMI with HS-TnI up to 7990 post-VT arrest and CPR.  As above, have been concerned for CAD as cause of cardiomyopathy.  ?Rise in troponin due to demand ischemia versus ACS => no ischemic pain (just chest wall tenderness  post-CPR) and no ECG change (old ASMI as in past).   - treated w/ heparin gtt, now off. INR 3.8  Today. No ischemic chest pain.  - Continue atorvastatin 80 mg daily.  - Continue ASA 81 daily.  - Ideally, would do coronary angiography but think we need improvement in creatinine for that.  - cMRI w/ poor imaging, Very difficult delayed enhancement images. Possible subtle subendocardial LGE in the mid inferior wall seen on the short axis images, but this could not be confirmed in the long axis images. There does not appear to be a large amount of scar. 6. Cardiac arrest: Pulseless VT.  Electrolytes stable. No chest pain or dyspnea prior.  ?Cause => suspect scar-mediated.  - Continue amiodarone 200 mg po bid.  - EP following. Plan for ICD prior to d/c (likely Monday)  7. Pulmonary: RLL PNA post-arrest by CXR.  Suspect aspiration. Afebrile.  - She is now on ceftriaxone. 8. Pleural Effusion S/P R Thoracentesis with 700 cc removed.  9. Hyponatremia - Na 126 - mentation ok.  - consider tolvaptan.  10. Anemia - Hbg 8.6  - ? Anemia of chronic disease from CKD. No overt GI bleeding  - Will send anemia studies and FOBT.   Length of Stay: [address], PA-C  06/10/2020, 8:00 AM  Advanced Heart Failure Team Pager [phone] (M-F; 7a - 4p)  Please contact [facility] for night-coverage after hours (4p -7a ) and weekends on [email or web address]  Patient seen with NP, agree with the above note.   No further VT. Creatinine 2.86 => 2.94 today. Weight stable, no diuretics. Walked farther in hall yesterday, had some mild dyspnea with walk.  BP better today on midodrine.   General: NAD Neck:JVP 8-9 cm, no thyromegaly or thyroid nodule.  Lungs: Clear to auscultation bilaterally with normal respiratory effort. CV: Nondisplaced PMI.  Heart irregular S1/S2, no S3/S4, no murmur.  No peripheral edema.   Abdomen: Soft, nontender, no hepatosplenomegaly, no distention.  Skin: Intact without lesions or  rashes.  Neurologic: Alert and oriented x 3.  Psych: Normal affect. Extremities: No clubbing or cyanosis.  HEENT: Normal.   Suspect mild residual  volume overload but creatinine up to 2.9 (hopefully plateauing). Cardiac MRI with EF 25-30%, difficult study but does not have extensive LGE.  - No diuretic today with rise in creatinine. - With soft BP and orthostasis, now off Coreg and increased midodrine to 5 mg tid.  BP better today.  - Hold Farxiga until creatinine stabilizes.  - meds limited by soft BP and CKD. Will be Verquvo candidate.  - Coronary angiography if creatinine trends downeventually but probably no time soon in absence of definite ACS.   No further VT, remains on amiodarone po.  -Needs INR lower for ICD (closer to discharge).  Plan for ICD possibly Monday.  - warfarin on hold with INR 3.8.  Eating greens.   Discussed abnormal TFTs with endocrinology,startedlow dose methimazole 5 mg qam.  Hgb trending down, ?chronic disease/renal disease.  No overt GI bleeding.  Will send anemia studies and FOBT.   Ceftriaxone for possible aspiration PNA with arrest.   AKI, she has not been aggressively diuresed for a couple of days now.  I suspect this is ATN related to her cardiac arrest.  - Hold diuretics, only mildly volume overloaded on exam.  - Try to optimize BP as above.  - Renal following.   Loralie Champagne 06/10/2020 9:05 AM

## 2020-06-10 NOTE — Progress Notes (Signed)
Progress Note  Patient Name: Cynthia Hansen Date of Encounter: 06/10/2020  [facility] Cardiologist: Larae Grooms, MD   Subjective   Feeling OK, had some chest discomfort last night, remains resolved, no SOB  Inpatient Medications    Scheduled Meds: . acetaminophen  1,000 mg Oral TID  . amiodarone  200 mg Oral BID  . aspirin  81 mg Oral Daily  . atorvastatin  80 mg Oral Daily  . colestipol  1 g Oral BID  . dapagliflozin propanediol  10 mg Oral Daily  . feeding supplement  237 mL Oral BID BM  . insulin aspart  0-5 Units Subcutaneous QHS  . insulin aspart  0-9 Units Subcutaneous TID WC  . melatonin  3 mg Oral QHS  . mesalamine  4.8 g Oral Q breakfast  . methimazole  5 mg Oral Daily  . midodrine  5 mg Oral TID WC  . ondansetron (ZOFRAN) IV  4 mg Intravenous Once  . sodium chloride flush  3 mL Intravenous Q12H  . sodium chloride flush  3 mL Intravenous Q12H  . Warfarin - Pharmacist Dosing Inpatient   Does not apply q1600   Continuous Infusions: . sodium chloride Stopped (06/09/20 2219)  . sodium chloride    . cefTRIAXone (ROCEPHIN)  IV Stopped (06/09/20 2204)   PRN Meds: sodium chloride, acetaminophen, ondansetron (ZOFRAN) IV, sodium chloride flush   Vital Signs    Vitals:   06/09/20 2308 06/10/20 0344 06/10/20 0630 06/10/20 0740  BP: (!) 100/52 107/62  128/73  Pulse: 71 (!) 105    Resp: 17 16  20   Temp: 98.5 F (36.9 C) 98.1 F (36.7 C)  97.8 F (36.6 C)  TempSrc: Oral Oral  Oral  SpO2: 92% 92%    Weight:   78.5 kg   Height:        Intake/Output Summary (Last 24 hours) at 06/10/2020 0830 Last data filed at 06/10/2020 2263 Gross per 24 hour  Intake 582.76 ml  Output -  Net 582.76 ml   Last 3 Weights 06/10/2020 06/09/2020 06/08/2020  Weight (lbs) 173 lb 1 oz 173 lb 8 oz 174 lb 6.1 oz  Weight (kg) 78.5 kg 78.7 kg 79.1 kg      Telemetry    AFib w/CVR, generally 70's-80's - Personally Reviewed  ECG    AFib  86bpm - Personally Reviewed  Physical  Exam   GEN: No acute distress.   Neck: No JVD Cardiac: RRR, no murmurs, rubs, or gallops.  Respiratory: soft crackles L base GI: Soft, nontender, non-distended  MS: noedema; No deformity. Neuro:  Nonfocal  Psych: Normal affect   Labs    High Sensitivity Troponin:   Recent Labs  Lab 06/05/20 2020 06/05/20 2238 06/06/20 0315 06/06/20 0606  TROPONINIHS 1,317* 4,017* 6,872* 7,990*      Chemistry Recent Labs  Lab 06/08/20 1112 06/09/20 0017 06/09/20 1321 06/10/20 0044  NA  --  135 133* 126*  K  --  3.9 3.9 3.8  CL  --  99 100 95*  CO2  --  23 20* 19*  GLUCOSE  --  139* 206* 173*  BUN  --  69* 70* 74*  CREATININE  --  2.86* 2.87* 2.94*  CALCIUM  --  8.5* 8.6* 8.0*  PROT 5.9*  --   --   --   ALBUMIN 3.0*  --   --   --   AST 21  --   --   --   ALT 22  --   --   --  ALKPHOS 57  --   --   --   BILITOT 0.6  --   --   --   GFRNONAA  --  16* 16* 16*  ANIONGAP  --  13 13 12      Hematology Recent Labs  Lab 06/08/20 0130 06/09/20 0017 06/09/20 1321 06/10/20 0044  WBC 7.4 7.8  --  7.6  RBC 3.05* 2.96*  --  2.95*  HGB 9.0* 8.6* 8.8* 8.6*  HCT 28.7* 27.9* 27.1* 27.8*  MCV 94.1 94.3  --  94.2  MCH 29.5 29.1  --  29.2  MCHC 31.4 30.8  --  30.9  RDW 14.4 14.2  --  14.1  PLT 229 227  --  226    BNP No results for input(s): BNP, PROBNP in the last 168 hours.   DDimer No results for input(s): DDIMER in the last 168 hours.   Radiology     MR CARDIAC MORPHOLOGY W WO CONTRAST Result Date: 06/07/2020 CLINICAL DATA:  Cardiomyopathy of uncertain etiology EXAM: CARDIAC MRI TECHNIQUE: The patient was scanned on a 1.5 Tesla GE magnet. A dedicated cardiac coil was used. Functional imaging was done using Fiesta sequences. 2,3, and 4 chamber views were done to assess for RWMA's. Modified Simpson's rule using a short axis stack was used to calculate an ejection fraction on a dedicated work Conservation officer, nature. The patient received 8 cc of Gadavist. After 10 minutes  inversion recovery sequences were used to assess for infiltration and scar tissue. FINDINGS: Limited images of the lung fields showed a moderate right pleural effusion. Difficult images due to atrial fibrillation and respiratory artifact. Normal left ventricular size and wall thickness. Unable to quantify LV systolic function due to respiratory artifact. Would estimate LV EF 25-30% range. Diffuse hypokinesis, worse in the inferior wall. The right ventricle looks normal in size with mild-moderate systolic dysfunction. Moderate left and right atrial enlargement. Trileaflet aortic valve, no significant stenosis or regurgitation. There does not appear to be more than mild mitral regurgitation. Difficult delayed enhancement images. In the short axis images, there was concern for possible subtle mid inferior wall subendocardial late gadolinium enhancement (LGE) but this was not seen in the long axis images. IMPRESSION: 1. Very difficult images due to respiratory artifact and atrial fibrillation. 2. Due to poor images, unable to quantify LV EF. Estimate EF 25-30%, diffuse hypokinesis looking worse in the inferior wall. 3.  Normal RV size with mild-moderately decreased systolic function. 4. Very difficult delayed enhancement images. Possible subtle subendocardial LGE in the mid inferior wall seen on the short axis images, but this could not be confirmed in the long axis images. There does not appear to be a large amount of scar. Dalton Mclean Electronically Signed   By: Loralie Champagne M.D.   On: 06/07/2020 17:11    Cardiac Studies   CARDIAC CATHETERIZATION Result Date: 06/07/2020 1. Mildly elevated filling pressures 2. Pulmonary venous hypertension (mild) 3. Relatively preserved cardiac output  05/04/20: lexiscan stress myoview Myocardial perfusion is abnormal. Findings consistent with prior myocardial infarction. This is a high risk study. Overall left ventricular systolic function was abnormal. LV cavity  size is normal. Nuclear stress EF: 26%. The left ventricular ejection fraction is severely decreased (<30%). that was performed on 5/2/2007Compared to the prior study, there are changes. Defect 1: There is a medium defect of severe severity present in the basal inferior, mid inferior, apical inferior and apical lateral location. The defect is non-reversible.  Defect 2: There is  a small defect of moderate severity present in the mid inferoseptal, apical septal and apex location. The defect is non-reversible.   03/14/2020: TTE IMPRESSIONS  1. Left ventricular ejection fraction, by estimation, is 35 to 40%. The  left ventricle has moderately decreased function. Left ventricular  endocardial border not optimally defined to evaluate regional wall motion.  The left ventricular internal cavity  size was moderately dilated. Left ventricular diastolic function could not  be evaluated.  2. Right ventricular systolic function is mildly reduced. The right  ventricular size is normal. There is mildly elevated pulmonary artery  systolic pressure. The estimated right ventricular systolic pressure is  00.8 mmHg.  3. Left atrial size was moderately dilated.  4. Right atrial size was severely dilated.  5. The mitral valve is normal in structure. Mild to moderate mitral valve  regurgitation. No evidence of mitral stenosis. Moderate mitral annular  calcification.  6. Tricuspid valve regurgitation is moderate.  7. The aortic valve is normal in structure. Aortic valve regurgitation is  not visualized. No aortic stenosis is present.  8. The inferior vena cava is normal in size with greater than 50%  respiratory variability, suggesting right atrial pressure of 3  mmHg.Consider limited study with definity contrast to better evsaluate for  wall motion abnormalities    03/23/2006: TTE SUMMARY  - Overall left ventricular systolic function was normal.  - Mildly thickened trileaflet  aortic valve.  - There was mild atheroma of the aortic arch. There was mild     atheroma of the descending aorta.  - There was mild mitral valvular regurgitation.  - The left atrium was dilated. There was no left atrial appendage     thrombus identified.   Patient Profile     80 y.o. female with a hx of HLD, DM, + smoker quit 2017, PVD (50-69% RICA and patent Left carotid endarterectomy 5/19/8m follow w/Dr. Oneida Alar), AFib (permanent), DCM, CKD (IV) admitted with acute/chronic CHF developed pulseless VT arrest while here.  Assessment & Plan    1. VT 2. NSTEMI     MMVT 06/06/19     No notable electrolyte derangement     She has been transitioned to PO amiodarone   Ideally would get LHC, though renal disease felt not to allow  CM is newly found Not sure to what extent she will be able to get to GDMT  Dr. Quentin Ore has seen and examined the patient this morning, pt encouraged to continue OOB with PT and mobilize. Need her labs to stabilize,  and near d/c will look towards Monday  The patient was made aware of [place] law, no driving 56mo      2. CM     Suspected to be  NICM given no reversible defects on her stress test      No current plans for LHC 2/2 renal disease      She does have known PVD and was a ong standing smoker with 2 fixed defects on her stress test Nov 2021     unfortunately c/MRI was limited, though no large scar burden 3. Acute/chronic CHF    RHC as above     S/p thora     C/w HF team  No output charted, though pt reports urine output, weight stable\  4. AKI on CKD (IV)     (March 2021 1.9 >> Dec 9th 2.98 > Dec 22 was  2.26)     continues on the rise     Nephrology on  board yesterday     Hyponatremic this AM        5. Post arrest pneumonia     On abx      WBC remains normalized     Afebrile  6. Permanent AFib     CHA2DS2Vasc is 6, outpt was on warfarin     heparin gtt here > held today for RHC     warfarin resumed     Would ask  that her goal for now be 2-2.5, try to keep <3.0 for planned ICD       Rapid rise in INR to 3.5 yesterday and 3.8 today with no dose yesterday, pharmacy to hold warfarin today      H/H stable from yesterday  7. Anemia     H/H down from 2 days ago     She looks well perfused, no pallor     Check H/H later today   8. CP     She has had some generalized aching/tenderness in her chest 2/2 CPR     She mentions a different discomfort last night, felt like a discomfort in her breast seemed to include her L arm, no associated symptoms, and resolved fairly quickly without intervention and remains resolved      EKG was done without change from last, though in review with Dr. Quentin Ore, she does have new Q waves isolated to III post arrest, unclear significance      With Creat on the rise and self terminated pain, not cath candidate      On midodrine yesterday and titrated up      Will defer to primary team     Dr. Quentin Ore has seen and examined the patient this morning Will continue to defer management to primary/AHF team ICD implant scheduled for Monday pending her clinical course      For questions or updates, please contact [facility] Please consult [email or web address] for contact info under        Signed, Baldwin [place], PA-C  06/10/2020, 8:30 AM

## 2020-06-10 NOTE — Plan of Care (Signed)

## 2020-06-10 NOTE — Progress Notes (Signed)
Inpatient Rehabilitation Admissions Coordinator  Inpatient rehab consult received. I met with patient and her daughter at bedside. We discussed goals and expectations of a possible [facility] admit. They prefer [facility]. Noted ICD pending. I can not begin insurance authorization with [employer] until she is within 24 to 48 hrs to be medically ready for admit to [facility]. I will follow her progress daily.  Danne Baxter, RN, MSN Rehab Admissions Coordinator [phone] 06/10/2020 1:07 PM

## 2020-06-10 NOTE — Progress Notes (Signed)
[place] for Heparin > warfarin Indication: atrial fibrillation  Allergies  Allergen Reactions  . Avandia [Rosiglitazone] Other (See Comments)    Causes muscle weakness.  . Latex Rash  . Sulfa Antibiotics Rash    Patient Measurements: Height: 5\' 8"  (172.7 cm) Weight: 78.5 kg (173 lb 1 oz) IBW/kg (Calculated) : 63.9 Heparin Dosing Weight: 79.3 kg  Vital Signs: Temp: 97.8 F (36.6 C) (01/06 0740) Temp Source: Oral (01/06 0740) BP: 128/73 (01/06 0740) Pulse Rate: 105 (01/06 0344)  Labs: Recent Labs    06/08/20 0130 06/09/20 0017 06/09/20 1321 06/10/20 0044  HGB 9.0* 8.6* 8.8* 8.6*  HCT 28.7* 27.9* 27.1* 27.8*  PLT 229 227  --  226  LABPROT 22.6* 34.3*  --  36.3*  INR 2.1* 3.5*  --  3.8*  CREATININE 2.70* 2.86* 2.87* 2.94*    Estimated Creatinine Clearance: 17.4 mL/min (A) (by C-G formula based on SCr of 2.94 mg/dL (H)).   Medical History: Past Medical History:  Diagnosis Date  . A-fib ([place])   . Acute on chronic combined systolic (congestive) and diastolic (congestive) heart failure ([place])   . Carotid artery occlusion   . DCM (dilated cardiomyopathy) ([place])    EF 30-35% by echo 05/2020  . Diabetes mellitus   . Hyperlipidemia   . Irregular heart beat   . Vitamin D deficiency      Assessment: [name] on warfarin for afib PTA now holding warfarin for [facility]. Pharmacy consulted to start heparin when INR <2. Admit INR 2.8> down to 1.8.   Last Coumadin dose taken 12/28.  Warfarin resumed post cath 1/3, INR up to 3.8 (dose held on 1/5). Hgb 8.6, plt 226. Goal to keep INR 2-2.5 for ICD placement 1/7.  No s/sx of bleeding - will give some ensure to help trend INR downward.   Warfarin PTA dose: 1.5mg  Fridays, 1mg  all other days (Last seen 05/26/20 by [facility]).    Goal of Therapy:  Heparin level 0.3-0.7 units/ml Monitor platelets by anticoagulation protocol: Yes   Plan:  -No warfarin today -Will order ensure to help trend INR  down -Daily CBC/Protime -Monitor for bleeding   Antonietta Jewel, PharmD, BCCCP Clinical Pharmacist  Phone: [phone] 06/10/2020 8:17 AM  Please check AMION for all [place] phone numbers After 10:00 PM, call [place] [phone]

## 2020-06-10 NOTE — Progress Notes (Signed)
Physical Therapy Treatment Patient Details Name: Cynthia Hansen MRN: [id number] DOB: [birth year] Today's Date: 06/10/2020    History of Present Illness 80yo female admitted with acute systolic CHF. PMH A-fib, acute on chronic heart failure, DCM wtih 30-35% ejection fraction, DM, HLD, 06/05/20 Cardiac arrest with 4 min CPR, 06/08/20 R lung thorocentesis    PT Comments    Pt's daughter is present and helpful in encouraging pt to get up and walk. Pt reports some dizziness with coming to EoB, and BP is low however increases with standing. Pt is min A for mobility so focus of session relaxation to decrease pain, HR response to activity and nausea.  Worked with pt on improving form for sit<>stand that reduces forward flexion and resultant nausea as well as increased safety. After increase in HR and pain with ambulation in room sat EoB and worked on rhythmic breathing, and muscle relaxation. Once improved continued to work to progress to sit<>stand with min guard for safety. Pt verbalizes understand of her tendency for holding breath and increased muscle activation when she anticipates pain and states she will work whenever she gets up to the bathroom. D/c plans remain appropriate. PT will continue to follow acutely.   Follow Up Recommendations  CIR     Equipment Recommendations  3in1 (PT);Rolling walker with 5" wheels       Precautions / Restrictions Precautions Precautions: Fall;Other (comment) Precaution Comments: watch HR, chronic BLE numbness Restrictions Weight Bearing Restrictions: No    Mobility  Bed Mobility Overal bed mobility: Needs Assistance Bed Mobility: Supine to Sit     Supine to sit: HOB elevated;Min assist     General bed mobility comments: min A for rolling to sidelying and pushing up to seated  Transfers Overall transfer level: Needs assistance Equipment used: None Transfers: Sit to/from Stand Sit to Stand: Min assist;Min guard         General transfer comment:  min A for power up and steadying on initial 2 bouts of standing, focus on decreased forward flexion which causes nausea, practiced 4x additional sit<>stand with cuing for upright posture and increased UE usage to come to standing  Ambulation/Gait Ambulation/Gait assistance: Min assist Gait Distance (Feet): 20 Feet Assistive device: Rolling walker (2 wheeled) Gait Pattern/deviations: Step-through pattern;Drifts right/left;Narrow base of support Gait velocity: decreased Gait velocity interpretation: <1.31 ft/sec, indicative of household ambulator General Gait Details: min A for steadying with RW, max HR of 130 bpm with walking away from sink to walk in hallway, pt with nausea requests to sit HR returns to 110s with sitting.         Balance Overall balance assessment: Needs assistance Sitting-balance support: Bilateral upper extremity supported;Feet supported Sitting balance-Leahy Scale: Good     Standing balance support: During functional activity;Single extremity supported;Bilateral upper extremity supported Standing balance-Leahy Scale: Fair Standing balance comment: mild unsteadiness increases with fatigue and she ultimately needed MinA to maintain balance dynamically                            Cognition Arousal/Alertness: Awake/alert Behavior During Therapy: WFL for tasks assessed/performed Overall Cognitive Status: Within Functional Limits for tasks assessed                                           General Comments General comments (skin integrity, edema, etc.): BP low but  not orthostatic sitting 92/52, sitting 3 min 106/54, standing 112/62      Pertinent Vitals/Pain Pain Assessment: Faces Faces Pain Scale: Hurts little more Pain Location: back and neck with movement Pain Descriptors / Indicators: Aching;Sore Pain Intervention(s): Limited activity within patient's tolerance;Monitored during session;Repositioned;Utilized relaxation techniques            PT Goals (current goals can now be found in the care plan section) Acute Rehab PT Goals Patient Stated Goal: feel better, get stronger PT Goal Formulation: With patient/family Time For Goal Achievement: 06/19/20 Potential to Achieve Goals: Good    Frequency    Min 3X/week      PT Plan Discharge plan needs to be updated       AM-PAC PT "6 Clicks" Mobility   Outcome Measure  Help needed turning from your back to your side while in a flat bed without using bedrails?: None Help needed moving from lying on your back to sitting on the side of a flat bed without using bedrails?: A Little Help needed moving to and from a bed to a chair (including a wheelchair)?: A Little Help needed standing up from a chair using your arms (e.g., wheelchair or bedside chair)?: A Little Help needed to walk in hospital room?: A Little Help needed climbing 3-5 steps with a railing? : A Lot 6 Click Score: 18    End of Session Equipment Utilized During Treatment: Gait belt Activity Tolerance: Patient tolerated treatment well Patient left: in chair;with call bell/phone within reach Nurse Communication: Mobility status PT Visit Diagnosis: Unsteadiness on feet (R26.81);Muscle weakness (generalized) (M62.81);Difficulty in walking, not elsewhere classified (R26.2)     Time: 3149-7026 PT Time Calculation (min) (ACUTE ONLY): 50 min  Charges:  $Gait Training: 8-22 mins $Therapeutic Exercise: 8-22 mins $Therapeutic Activity: 8-22 mins                     Yamaris Cummings B. Migdalia Dk PT, DPT Acute Rehabilitation Services Pager [phone] Office [phone]    [place] 06/10/2020, 5:13 PM

## 2020-06-11 DIAGNOSIS — N179 Acute kidney failure, unspecified: Secondary | ICD-10-CM | POA: Diagnosis not present

## 2020-06-11 DIAGNOSIS — I5021 Acute systolic (congestive) heart failure: Secondary | ICD-10-CM | POA: Diagnosis not present

## 2020-06-11 LAB — BASIC METABOLIC PANEL
Anion gap: 13 (ref 5–15)
BUN: 76 mg/dL — ABNORMAL HIGH (ref 8–23)
CO2: 20 mmol/L — ABNORMAL LOW (ref 22–32)
Calcium: 8.8 mg/dL — ABNORMAL LOW (ref 8.9–10.3)
Chloride: 101 mmol/L (ref 98–111)
Creatinine, Ser: 2.85 mg/dL — ABNORMAL HIGH (ref 0.44–1.00)
GFR, Estimated: 16 mL/min — ABNORMAL LOW (ref >60)
Glucose, Bld: 157 mg/dL — ABNORMAL HIGH (ref 70–99)
Potassium: 4.1 mmol/L (ref 3.5–5.1)
Sodium: 134 mmol/L — ABNORMAL LOW (ref 135–145)

## 2020-06-11 LAB — GLUCOSE, CAPILLARY
Glucose-Capillary: 104 mg/dL — ABNORMAL HIGH (ref 70–99)
Glucose-Capillary: 138 mg/dL — ABNORMAL HIGH (ref 70–99)
Glucose-Capillary: 117 mg/dL — ABNORMAL HIGH (ref 70–99)

## 2020-06-11 LAB — CBC
HCT: 26.1 % — ABNORMAL LOW (ref 36.0–46.0)
Hemoglobin: 8.6 g/dL — ABNORMAL LOW (ref 12.0–15.0)
MCH: 30.4 pg (ref 26.0–34.0)
MCHC: 33 g/dL (ref 30.0–36.0)
MCV: 92.2 fL (ref 80.0–100.0)
Platelets: 242 10*3/uL (ref 150–400)
RBC: 2.83 MIL/uL — ABNORMAL LOW (ref 3.87–5.11)
RDW: 14.1 % (ref 11.5–15.5)
WBC: 7 10*3/uL (ref 4.0–10.5)
nRBC: 0.3 % — ABNORMAL HIGH (ref 0.0–0.2)

## 2020-06-11 LAB — BODY FLUID CULTURE: Culture: NO GROWTH

## 2020-06-11 LAB — OSMOLALITY: Osmolality: 314 mOsm/kg — ABNORMAL HIGH (ref 275–295)

## 2020-06-11 LAB — PROTIME-INR
INR: 2.9 — ABNORMAL HIGH (ref 0.8–1.2)
Prothrombin Time: 29.5 seconds — ABNORMAL HIGH (ref 11.4–15.2)

## 2020-06-11 LAB — MAGNESIUM: Magnesium: 2.5 mg/dL — ABNORMAL HIGH (ref 1.7–2.4)

## 2020-06-11 MED ORDER — FUROSEMIDE 10 MG/ML IJ SOLN
40.0000 mg | Freq: Once | INTRAMUSCULAR | Status: AC
  Administered 2020-06-11: 40 mg via INTRAVENOUS
  Filled 2020-06-11: qty 4

## 2020-06-11 MED ORDER — POLYETHYLENE GLYCOL 3350 17 G PO PACK: 17.0000 g | PACK | Freq: Every day | ORAL | Status: DC | PRN

## 2020-06-11 NOTE — Progress Notes (Signed)
Physical Therapy Treatment Patient Details Name: Cynthia Hansen MRN: [id number] DOB: 12-Nov-1940 Today's Date: 06/11/2020    History of Present Illness 80yo female admitted with acute systolic CHF. PMH A-fib, acute on chronic heart failure, DCM wtih 30-35% ejection fraction, DM, HLD, 06/05/20 Cardiac arrest with 4 min CPR, 06/08/20 R lung thorocentesis    PT Comments    Pt request to go to bathroom prior to ambulation, reports she has not had a BM since she came here and is beginning to get worried. Pt is progressing her mobility nicely. Pt is min guard for bed mobility, and transfers from elevated surface, as well as ambulation with RW. Pt with much improved relaxation and deep breathing during ambulation today. Pt report placement of ICD next week. D/c plans remain appropriate. PT will continue to follow acutely.     Follow Up Recommendations  CIR     Equipment Recommendations  3in1 (PT);Rolling walker with 5" wheels       Precautions / Restrictions Precautions Precautions: Fall;Other (comment) Precaution Comments: watch HR, chronic BLE numbness Restrictions Weight Bearing Restrictions: No    Mobility  Bed Mobility Overal bed mobility: Needs Assistance Bed Mobility: Supine to Sit     Supine to sit: HOB elevated;Min guard     General bed mobility comments: min guard for safety to roll onto side and push up from elevated HoB to come to seated increased effort and heavy use of bedrail  Transfers Overall transfer level: Needs assistance Equipment used: None Transfers: Sit to/from Stand Sit to Stand: Min guard         General transfer comment: min guard for safety, vc for hand placement and upright trunk for power up to standing, PT stood in way to decrease tendancy for forward flexion  Ambulation/Gait Ambulation/Gait assistance: Min guard Gait Distance (Feet): 100 Feet Assistive device: Rolling walker (2 wheeled) Gait Pattern/deviations: Step-through pattern;Drifts  right/left;Narrow base of support Gait velocity: decreased Gait velocity interpretation: <1.8 ft/sec, indicate of risk for recurrent falls General Gait Details: min guard for safety and line management, vc for relaxed upright posture, able to ambulate with max HR 123 bpm quickly returned to high 80s with deep breathing       Balance Overall balance assessment: Needs assistance Sitting-balance support: Bilateral upper extremity supported;Feet supported Sitting balance-Leahy Scale: Good     Standing balance support: During functional activity;Single extremity supported;Bilateral upper extremity supported Standing balance-Leahy Scale: Fair Standing balance comment: mild unsteadiness increases with fatigue and she ultimately needed MinA to maintain balance dynamically                            Cognition Arousal/Alertness: Awake/alert Behavior During Therapy: WFL for tasks assessed/performed Overall Cognitive Status: Within Functional Limits for tasks assessed                                               Pertinent Vitals/Pain Pain Assessment: Faces Faces Pain Scale: Hurts little more Pain Location: back and neck with movement Pain Descriptors / Indicators: Aching;Sore Pain Intervention(s): Limited activity within patient's tolerance           PT Goals (current goals can now be found in the care plan section) Acute Rehab PT Goals Patient Stated Goal: feel better, get stronger PT Goal Formulation: With patient/family Time For Goal Achievement: 06/19/20  Potential to Achieve Goals: Good    Frequency    Min 3X/week      PT Plan Discharge plan needs to be updated       AM-PAC PT "6 Clicks" Mobility   Outcome Measure  Help needed turning from your back to your side while in a flat bed without using bedrails?: None Help needed moving from lying on your back to sitting on the side of a flat bed without using bedrails?: A Little Help needed  moving to and from a bed to a chair (including a wheelchair)?: A Little Help needed standing up from a chair using your arms (e.g., wheelchair or bedside chair)?: A Little Help needed to walk in hospital room?: A Little Help needed climbing 3-5 steps with a railing? : A Lot 6 Click Score: 18    End of Session Equipment Utilized During Treatment: Gait belt Activity Tolerance: Patient tolerated treatment well Patient left: in bed;with call bell/phone within reach;with bed alarm set Nurse Communication: Mobility status PT Visit Diagnosis: Unsteadiness on feet (R26.81);Muscle weakness (generalized) (M62.81);Difficulty in walking, not elsewhere classified (R26.2)     Time: 5672-0919 PT Time Calculation (min) (ACUTE ONLY): 37 min  Charges:  $Gait Training: 8-22 mins $Therapeutic Activity: 8-22 mins                     Juvencio Verdi B. Migdalia Dk PT, DPT Acute Rehabilitation Services Pager [phone] Office [phone]    [place] 06/11/2020, 3:10 PM

## 2020-06-11 NOTE — Progress Notes (Signed)
Patient ID: Cynthia Hansen, female   DOB: 02/06/1941, 80 y.o.   MRN: [id number] P    Advanced Heart Failure Rounding Note  PCP-Cardiologist: Larae Grooms, MD   Subjective:    - 06/05/20  patient had pulseless VT requiring shock x 1 and CPR x 4 minutes.  She had rapid recovery and was not intubated.  Electrolytes stable.  Amiodarone gtt started.  Due to low BP, she was started on norepinephrine 4 peripherally.  Creatinine rose 2.1 => 2.36 and HS-TnI rose to 7990.  Currently, only mild chest wall tenderness post-CPR.   - 06/08/19 RHC demonstrated mildly elevated filling pressures and relatively preserved CO. No LHC due to abnormal renal function. cMRI limited study. Very difficult images due to respiratory artifact and atrial fibrillation.  - 06/09/19 S/P Thoracentesis on R. 700 cc removed.   Creatinine starting to trend down, 2.94 => 2.85.  Walked in hall, short of breath by end of walk.  Weight up 1 lb. BP improved on midodrine.   Remains in rate controlled Afib. No further VT.   Only complaint today is pleuritic CP and chest wall soreness from chest compressions. No dyspnea.    Objective:   Weight Range: 79.1 kg Body mass index is 26.51 kg/m.   Vital Signs:   Temp:  [97.3 F (36.3 C)-98.1 F (36.7 C)] 97.7 F (36.5 C) (01/07 0757) Pulse Rate:  [66-76] 66 (01/07 0757) Resp:  [16-20] 18 (01/07 0757) BP: (101-105)/(51-65) 103/65 (01/07 0757) SpO2:  [96 %-98 %] 97 % (01/07 0757) Weight:  [79.1 kg] 79.1 kg (01/07 0642) Last BM Date: 06/05/20  Weight change: Filed Weights   06/09/20 0455 06/10/20 0630 06/11/20 0642  Weight: 78.7 kg 78.5 kg 79.1 kg    Intake/Output:   Intake/Output Summary (Last 24 hours) at 06/11/2020 0955 Last data filed at 06/11/2020 0630 Gross per 24 hour  Intake 122.03 ml  Output 650 ml  Net -527.97 ml      Physical Exam   General: NAD Neck: JVP 8-9 cm, no thyromegaly or thyroid nodule.  Lungs: Clear to auscultation bilaterally with normal  respiratory effort. CV: Nondisplaced PMI.  Heart regular S1/S2, no S3/S4, no murmur.  No peripheral edema.  Abdomen: Soft, nontender, no hepatosplenomegaly, no distention.  Skin: Intact without lesions or rashes.  Neurologic: Alert and oriented x 3.  Psych: Normal affect. Extremities: No clubbing or cyanosis.  HEENT: Normal.    Telemetry    A Fib 70-80s. No further VT. Personally reviewed.    Labs    CBC Recent Labs    06/10/20 0044 06/11/20 0051  WBC 7.6 7.0  HGB 8.6* 8.6*  HCT 27.8* 26.1*  MCV 94.2 92.2  PLT 226 834   Basic Metabolic Panel Recent Labs    06/10/20 0044 06/10/20 0902 06/11/20 0051  NA 126* 134* 134*  K 3.8 4.2 4.1  CL 95* 99 101  CO2 19* 20* 20*  GLUCOSE 173* 155* 157*  BUN 74* 74* 76*  CREATININE 2.94* 2.84* 2.85*  CALCIUM 8.0* 8.8* 8.8*  MG 2.3  --  2.5*  PHOS  --  5.3*  --    Liver Function Tests Recent Labs    06/08/20 1112 06/10/20 0902  AST 21  --   ALT 22  --   ALKPHOS 57  --   BILITOT 0.6  --   PROT 5.9*  --   ALBUMIN 3.0* 3.2*   No results for input(s): LIPASE, AMYLASE in the last 72 hours. Cardiac Enzymes  No results for input(s): CKTOTAL, CKMB, CKMBINDEX, TROPONINI in the last 72 hours.  BNP: BNP (last 3 results) Recent Labs    05/27/20 0828 06/02/20 1832  BNP 2,213.1* 2,338.9*    ProBNP (last 3 results) Recent Labs    05/13/20 1550 05/31/20 0939  PROBNP 46,357* 48,555*     D-Dimer No results for input(s): DDIMER in the last 72 hours. Hemoglobin A1C No results for input(s): HGBA1C in the last 72 hours. Fasting Lipid Panel No results for input(s): CHOL, HDL, LDLCALC, TRIG, CHOLHDL, LDLDIRECT in the last 72 hours. Thyroid Function Tests No results for input(s): TSH, T4TOTAL, T3FREE, THYROIDAB in the last 72 hours.  Invalid input(s): FREET3  Other results:   Imaging    No results found.   Medications:     Scheduled Medications: . acetaminophen  1,000 mg Oral TID  . amiodarone  200 mg Oral  BID  . aspirin  81 mg Oral Daily  . atorvastatin  80 mg Oral Daily  . colestipol  1 g Oral BID  . hydrocortisone cream   Topical QID  . insulin aspart  0-5 Units Subcutaneous QHS  . insulin aspart  0-9 Units Subcutaneous TID WC  . melatonin  3 mg Oral QHS  . mesalamine  4.8 g Oral Q breakfast  . methimazole  5 mg Oral Daily  . midodrine  5 mg Oral TID WC  . ondansetron (ZOFRAN) IV  4 mg Intravenous Once  . sodium chloride flush  3 mL Intravenous Q12H  . sodium chloride flush  3 mL Intravenous Q12H  . Warfarin - Pharmacist Dosing Inpatient   Does not apply q1600    Infusions: . sodium chloride 250 mL (06/10/20 2213)  . sodium chloride    . cefTRIAXone (ROCEPHIN)  IV 1 g (06/10/20 2214)  . ferumoxytol Stopped (06/10/20 1310)    PRN Medications: sodium chloride, acetaminophen, ondansetron (ZOFRAN) IV, sodium chloride flush   Assessment/Plan   1. Acute on chronic systolic CHF: Cardiolite in 11/21 with EF 26%, inferior fixed perfusion defect with no ischemia.  Echo in 12/21 with EF 35-40%, moderate LV dilation, mildly decreased RV function. Cause of cardiomyopathy uncertain but there is concern for ischemic cardiomyopathy. Cardiac MRI was difficult study, EF 25-30% estimated without definitive LGE. Worsening CHF recently complicated by CKD stage IV. She has had NYHA class III symptoms.  She diuresed well initially but had VT arrest.  RHC this admit w/ mildly elevated filling pressures, mild pulmonary HTN and relatively preserved CO. Diuretics have been on hold with AKI, now creatinine appears to be starting to come down.   Mild volume overload, weight up 1 lb.  - Coreg on hold for hypotension and dapagliflozin stopped with AKI.  - Will give 1 time dose of Lasix 40 mg IV today.  - Continue midodrine 5 mg tid  - Will be limited in terms of HF meds we can use with CKD 4, history of hyperkalemia, and soft BP.  For now, no ARNI/ARB/digoxin/spironolactone.   - Will be [place] candidate. -  Ideally, would have coronary angiography to look for CAD as cause of cardiomyopathy and VT.  However, not candidate at this time in absence of ACS given AKI.  2. AKI on CKD Stage IV: Creatinine appears to be starting to trend down. RHC  showed relatively preserved cardiac output.  - Continue midodrine to keep BP up.  - Nephrology following and conducting w/u. Appreciate their assistance.  - As above, will give 1 dose IV Lasix  today.  3. Chronic atrial fibrillation: This appears permanent, x years.  Has biatrial severe enlargement and unlikely to hold NSR with DCCV attempt. INR 2.9 today.  - Warfarin dosing per pharmacy, EP request INR in 2-2.5 range for ICD.  4. Low TSH: Concern for hyperthyroidism.  Free T4 is high, but free T3 is normal. Thyroid stimulating antibody negative  - Discussed abnormal TFTs with endocrinology, started on  low dose methimazole 5 mg qam. 5. CAD: NSTEMI with HS-TnI up to 7990 post-VT arrest and CPR.  As above, have been concerned for CAD as cause of cardiomyopathy.  ?Rise in troponin due to demand ischemia versus ACS => no ischemic pain (just chest wall tenderness post-CPR) and no ECG change (old ASMI as in past).    - Continue atorvastatin 80 mg daily.  - Continue ASA 81 daily.  - Ideally, would do coronary angiography but think we need improvement in creatinine for that in absence of ACS.  - cMRI w/ poor imaging, Very difficult delayed enhancement images. Possible subtle subendocardial LGE in the mid inferior wall seen on the short axis images, but this could not be confirmed in the long axis images. There does not appear to be a large amount of scar. 6. Cardiac arrest: Pulseless VT.  Electrolytes stable. No chest pain or dyspnea prior.  ?Cause => suspect scar-mediated.  - Continue amiodarone 200 mg po bid.  - EP following. Plan for ICD prior to d/c (likely Monday)  7. Pulmonary: RLL PNA post-arrest by CXR.  Suspect aspiration. Afebrile.  - She is now on  ceftriaxone. 8. Pleural Effusion: S/P R Thoracentesis with 700 cc removed. Transudate from CHF.  9. Hyponatremia: Improved, Na 134 today.  10. Anemia: Hgb 8.6.  ? Anemia of chronic disease from CKD. No overt GI bleeding but has Fe deficiency.  - Await FOBT.  - Received feraheme.  11. Constipation: Miralax today.   Mobilize, will need CIR after ICD.   Length of Stay: 9  Loralie Champagne, MD  06/11/2020, 9:55 AM  Advanced Heart Failure Team Pager [phone] (M-F; 7a - 4p)  Please contact [facility] for night-coverage after hours (4p -7a ) and weekends on [email or web address]

## 2020-06-11 NOTE — Progress Notes (Signed)
[facility] Progress Note   Assessment/ Plan:   1. AKI on CKD Stage IV - Baseline appears 1.8 -2. AKI likely in setting of her primary problem , acute heart failure. Cr has leveled off and steady @ ~2.8-2.9 - Workup of CKD has suggested this is from chronic conditions - tobacco use disorder/HTN/DM. AKI in setting of acute  - Na studies do not suggest SIADH. Hyponatremia improved today  - Volume status stable, up 1 kg but negative 1 L yesterday, discordant measurement.   - Agree with only 1 dose of lasix to maintain current status  - Continue midodrine 5 mg TID - Continue holding Farxiga  2. Acute systolic CHF- TTE 12/45 with EF 35% - 40% - AHF following - IV lasix 40 today - Plan for IC 06/12/2019  3. Anemia: Workup suggest anemia from CKD and IDA. Given one dose of Ferraheme yesterday. Spoke to RN and waiting on patient to have bowel movement to collect FOBT. 4. CKD-MBD: expect Ca correct to Nl.  5. Nutrition:Low sodium diet    Please see attending attestation for final recommendation  Subjective:   Cynthia Hansen is a 80 y.o. with PMH of Afib, DM II, HTN, h/o CEA and new dx of dilated cardiomyopathy with EF 30-35% who is seen in consultation for AKI on CKD. She is on hospital day 9.  She continues to experience pleuritic chest pain from recent chest compression. She was excited to share she made it to the end of the hall today when working with Cardiac Rehab RN.Constipated. No other concerns.    Objective:   BP 103/65 (BP Location: Right Arm)   Pulse 66   Temp 97.7 F (36.5 C) (Oral)   Resp 18   Ht 5\' 8"  (1.727 m)   Wt 79.1 kg   SpO2 97%   BMI 26.51 kg/m   Physical Exam: YKD:XIPJ kept, NAD CVS: Regularly irregular, no murmur , rub, or gallop Resp: Normal work of breathing , coarse rales in right lower base, no wheezing Abd: Soft, NT Ext: No edema, warm   Labs: BMET Recent Labs  Lab 06/07/20 0611 06/07/20 0826 06/08/20 0130 06/09/20 0017  06/09/20 1321 06/10/20 0044 06/10/20 0902 06/11/20 0051  NA 136 138  138 136 135 133* 126* 134* 134*  K 4.0 4.1  4.1 4.1 3.9 3.9 3.8 4.2 4.1  CL 102  --  98 99 100 95* 99 101  CO2 21*  --  21* 23 20* 19* 20* 20*  GLUCOSE 120*  --  143* 139* 206* 173* 155* 157*  BUN 53*  --  61* 69* 70* 74* 74* 76*  CREATININE 2.34*  --  2.70* 2.86* 2.87* 2.94* 2.84* 2.85*  CALCIUM 8.7*  --  8.7* 8.5* 8.6* 8.0* 8.8* 8.8*  PHOS  --   --   --   --   --   --  5.3*  --    CBC Recent Labs  Lab 06/08/20 0130 06/09/20 0017 06/09/20 1321 06/10/20 0044 06/11/20 0051  WBC 7.4 7.8  --  7.6 7.0  HGB 9.0* 8.6* 8.8* 8.6* 8.6*  HCT 28.7* 27.9* 27.1* 27.8* 26.1*  MCV 94.1 94.3  --  94.2 92.2  PLT 229 227  --  226 242      Medications:    . acetaminophen  1,000 mg Oral TID  . amiodarone  200 mg Oral BID  . aspirin  81 mg Oral Daily  . atorvastatin  80 mg Oral Daily  .  colestipol  1 g Oral BID  . furosemide  40 mg Intravenous Once  . hydrocortisone cream   Topical QID  . insulin aspart  0-5 Units Subcutaneous QHS  . insulin aspart  0-9 Units Subcutaneous TID WC  . melatonin  3 mg Oral QHS  . mesalamine  4.8 g Oral Q breakfast  . methimazole  5 mg Oral Daily  . midodrine  5 mg Oral TID WC  . ondansetron (ZOFRAN) IV  4 mg Intravenous Once  . sodium chloride flush  3 mL Intravenous Q12H  . sodium chloride flush  3 mL Intravenous Q12H  . Warfarin - Pharmacist Dosing Inpatient   Does not apply q1600     Tamsen Snider, MD PGY2 IMTS 06/11/2020, 10:35 AM

## 2020-06-11 NOTE — Progress Notes (Signed)
Electrophysiology Rounding Note  Patient Name: Cynthia Hansen Date of Encounter: 06/11/2020  Primary Cardiologist: Larae Grooms, MD Electrophysiologist: None   Subjective   The patient is doing well today.  At this time, the patient denies chest pain, shortness of breath, or any new concerns.  Inpatient Medications    Scheduled Meds: . acetaminophen  1,000 mg Oral TID  . amiodarone  200 mg Oral BID  . aspirin  81 mg Oral Daily  . atorvastatin  80 mg Oral Daily  . colestipol  1 g Oral BID  . hydrocortisone cream   Topical QID  . insulin aspart  0-5 Units Subcutaneous QHS  . insulin aspart  0-9 Units Subcutaneous TID WC  . melatonin  3 mg Oral QHS  . mesalamine  4.8 g Oral Q breakfast  . methimazole  5 mg Oral Daily  . midodrine  5 mg Oral TID WC  . ondansetron (ZOFRAN) IV  4 mg Intravenous Once  . sodium chloride flush  3 mL Intravenous Q12H  . sodium chloride flush  3 mL Intravenous Q12H  . Warfarin - Pharmacist Dosing Inpatient   Does not apply q1600   Continuous Infusions: . sodium chloride 250 mL (06/10/20 2213)  . sodium chloride    . cefTRIAXone (ROCEPHIN)  IV 1 g (06/10/20 2214)  . ferumoxytol Stopped (06/10/20 1310)   PRN Meds: sodium chloride, acetaminophen, ondansetron (ZOFRAN) IV, sodium chloride flush   Vital Signs    Vitals:   06/10/20 1926 06/10/20 2321 06/11/20 0403 06/11/20 0642  BP: (!) 104/51 (!) 103/55 101/65   Pulse: 74 68 76   Resp: 16 20 18    Temp: 98 F (36.7 C) 98.1 F (36.7 C) 97.6 F (36.4 C)   TempSrc: Oral Oral Oral   SpO2: 97% 98% 96%   Weight:    79.1 kg  Height:        Intake/Output Summary (Last 24 hours) at 06/11/2020 0726 Last data filed at 06/11/2020 0630 Gross per 24 hour  Intake 122.03 ml  Output 1050 ml  Net -927.97 ml   Filed Weights   06/09/20 0455 06/10/20 0630 06/11/20 0642  Weight: 78.7 kg 78.5 kg 79.1 kg    Physical Exam    GEN- The patient is well appearing, alert and oriented x 3 today.   Head-  normocephalic, atraumatic Eyes-  Sclera clear, conjunctiva pink Ears- hearing intact Oropharynx- clear Neck- supple Lungs- Clear to ausculation bilaterally, normal work of breathing Heart- Regular rate and rhythm, no murmurs, rubs or gallops GI- soft, NT, ND, + BS Extremities- no clubbing or cyanosis. No edema Skin- no rash or lesion Psych- euthymic mood, full affect Neuro- strength and sensation are intact  Labs    CBC Recent Labs    06/10/20 0044 06/11/20 0051  WBC 7.6 7.0  HGB 8.6* 8.6*  HCT 27.8* 26.1*  MCV 94.2 92.2  PLT 226 856   Basic Metabolic Panel Recent Labs    06/10/20 0044 06/10/20 0902 06/11/20 0051  NA 126* 134* 134*  K 3.8 4.2 4.1  CL 95* 99 101  CO2 19* 20* 20*  GLUCOSE 173* 155* 157*  BUN 74* 74* 76*  CREATININE 2.94* 2.84* 2.85*  CALCIUM 8.0* 8.8* 8.8*  MG 2.3  --  2.5*  PHOS  --  5.3*  --    Liver Function Tests Recent Labs    06/08/20 1112 06/10/20 0902  AST 21  --   ALT 22  --   ALKPHOS 57  --  BILITOT 0.6  --   PROT 5.9*  --   ALBUMIN 3.0* 3.2*   No results for input(s): LIPASE, AMYLASE in the last 72 hours. Cardiac Enzymes No results for input(s): CKTOTAL, CKMB, CKMBINDEX, TROPONINI in the last 72 hours.   Telemetry    NSR with occasional PVCs (personally reviewed)  Radiology    US RENAL  Result Date: 06/09/2020 CLINICAL DATA:  80 year old female with acute renal insufficiency. EXAM: RENAL / URINARY TRACT ULTRASOUND COMPLETE COMPARISON:  None. FINDINGS: Right Kidney: Renal measurements: 9.9 x 4.6 x 4.8 cm = volume: 113 mL. Moderate parenchyma atrophy. Normal echogenicity. No hydronephrosis or shadowing stone. Left Kidney: Renal measurements: 9.2 x 3.7 x 3.9 cm = volume: 70 mL. The left kidney is atrophic and poorly visualized. There is increased renal parenchyma atrophy. Evaluation of the kidney is very limited due to poor visualization. Bladder: Appears normal for degree of bladder distention. Other: None. IMPRESSION: 1.  Moderate right renal parenchyma atrophy. No hydronephrosis or shadowing stone. 2. Atrophic and poorly visualized left kidney. Electronically Signed   By: Anner Crete M.D.   On: 06/09/2020 18:29    Patient Profile     80 y.o. female with a hx of HLD, DM, + smoker quit 2017, PVD (50-69% RICA and patent Left carotid endarterectomy 5/19/21m follow w/Dr. Oneida Alar), AFib (permanent), DCM, CKD (IV) admitted with acute/chronic CHF developed pulseless VT arrest while here.  Assessment & Plan    1. VT 2. NSTEMI MMVT 06/06/19 No notable electrolyte derangement She has been transitioned to PO amiodarone   Ideally would get LHC, though renal disease felt to be prohibitive.   CM is newly found Not sure to what extent she will be able to get to GDMT with hypotension and renal disease.   The patient was made aware of [place] law, no driving 29mo  Planning ICD monday  2. CM Suspected to be NICM given no reversible defects on her stress test No current plans for LHC 2/2 renal disease      She does have known PVD and was a ong standing smoker with 2 fixed defects on her stress test Nov 2021 unfortunately c/MRI was limited, though no large scar burden     No change  3. Acute/chronic CHF RHC as above    S/p thora  HF team managing  4. AKI on CKD (IV) (March 2021 1.9 >>Dec 9th 2.98 >Dec 22 was 2.26)  Cr appears to have peaked at 2.85     Nephrology on board      Hyponatremic this AM at 134        5. Post arrest pneumonia On abx  WBC remains normalized Afebrile  6. Permanent AFib CHA2DS2Vasc is 6, outpt was on warfarin heparin gtt here >held for RHC     INR 2.9 this am. Would ask that her goal for now be 2-2.5, try to keep <3.0 for planned ICD       Rapid rise in INR to 3.5 yesterday and 3.8 today with no dose yesterday, pharmacy to hold warfarin today      H/H stable  7. Anemia   Stableat 8.6  8. CP     She  has had some generalized aching/tenderness in her chest 2/2 CPR     She mentions a different discomfort last night, felt like a discomfort in her breast seemed to include her L arm, no associated symptoms, and resolved fairly quickly without intervention and remains resolved  EKG was done without change from last, though in review with Dr. Quentin Ore, she does have new Q waves isolated to III post arrest, unclear significance      With Creat on the rise and self terminated pain, not cath candidate      On midodrine yesterday and titrated up      Will defer to primary team.  Stable.   For questions or updates, please contact [place] Please consult [email or web address] for contact info under Cardiology/STEMI.  Signed, Shirley Friar, PA-C  06/11/2020, 7:26 AM

## 2020-06-11 NOTE — TOC Progression Note (Signed)
Transition of Care [facility]) - Progression Note    Patient Details  Name: MARKELA WEE MRN: [id number] Date of Birth: 04/20/1941  [facility] CM/SW Contact  Zenon Mayo, RN Phone Number: 06/11/2020, 10:51 PM  Clinical Narrative:    For ICD on Sunday , waiting on INR to be at goal.  [facility] team will continue to follow for dc needs.        Expected Discharge Plan and Services                                                 Social Determinants of Health (SDOH) Interventions    Readmission Risk Interventions No flowsheet data found.

## 2020-06-11 NOTE — Progress Notes (Signed)
Occupational Therapy Treatment Patient Details Name: Cynthia Hansen MRN: [id number] DOB: 1941/03/12 Today's Date: 06/11/2020    History of present illness 80yo female admitted with acute systolic CHF. PMH A-fib, acute on chronic heart failure, DCM wtih 30-35% ejection fraction, DM, HLD, 06/05/20 Cardiac arrest with 4 min CPR, 06/08/20 R lung thorocentesis   OT comments  Pt. Seen for skilled OT session.  Able to complete LB dressing with min a.  Ambulation for grooming task min a.  Pt. Able to state when rest break needed and initiated appropriately.  Reports less dizziness and nausea today.  Remains excellent CIR level candidate as she was independent prior to hospitalization and plans to resume that level of independence.  Will benefit from intense level therapies prior to home for increasing strength and safety prior to home.   Follow Up Recommendations  CIR    Equipment Recommendations  None recommended by OT    Recommendations for Other Services      Precautions / Restrictions Precautions Precautions: Fall;Other (comment) Precaution Comments: watch HR, chronic BLE numbness       Mobility Bed Mobility               General bed mobility comments: seated in recliner at beginning and end of session  Transfers Overall transfer level: Needs assistance Equipment used: Rolling walker (2 wheeled) Transfers: Sit to/from [facility] Sit to Stand: Min assist Stand pivot transfers: Min guard       General transfer comment: multiple attempts to transtion from sitting to standing, pt. reports this has been a challenge for her prior to admission.  noted to push back onto heels when attempting transition into standing.  cues to power forward. pt. receptive to recommendations    Balance                                           ADL either performed or assessed with clinical judgement   ADL Overall ADL's : Needs assistance/impaired     Grooming:  Min guard;Standing Grooming Details (indicate cue type and reason): stood at sink to locate t.brush and t.paste             Lower Body Dressing: Minimal assistance;Sitting/lateral leans Lower Body Dressing Details (indicate cue type and reason): able to cross legs over knees to reach feet but remains limited with fatigue and need for rest breaks Toilet Transfer: Minimal assistance;RW Toilet Transfer Details (indicate cue type and reason): simulated with in room ambulation from recliner to sink         Functional mobility during ADLs: Minimal assistance;Rolling walker General ADL Comments: pt. with decreased dizziness reported this visit.  no nausea noted.  HR briefly to 120 during last portion of ambulation but quickly returned to mid 90s.  pt. seated in recliner and able to utilize breathing strategies and rest break-self initated.     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  Has one dtr. Cynthia Hansen and 2 grandsons.  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  Eager to regain strength and be able to attend her grandson's wedding.  Loves having her nails done and has never changed the color of polish she uses!      Pertinent Vitals/ Pain       Pain Assessment: No/denies pain  Home Living                                          Prior Functioning/Environment              Frequency  Min 2X/week        Progress Toward Goals  OT Goals(current goals can now be found in the care plan section)  Progress towards OT goals: Progressing toward goals     Plan      Co-evaluation                 AM-PAC OT "6 Clicks" Daily  Activity     Outcome Measure   Help from another person eating meals?: None Help from another person taking care of personal grooming?: A Little Help from another person toileting, which includes using toliet, bedpan, or urinal?: A Little Help from another person bathing (including washing, rinsing, drying)?: A Lot Help from another person to put on and taking off regular upper body clothing?: A Little Help from another person to put on and taking off regular lower body clothing?: A Lot 6 Click Score: 17    End of Session Equipment Utilized During Treatment: Rolling walker  OT Visit Diagnosis: Unsteadiness on feet (R26.81);Muscle weakness (generalized) (M62.81);Dizziness and giddiness (R42);Pain   Activity Tolerance Patient tolerated treatment well   Patient Left in chair;with call bell/phone within reach   Nurse Communication          Time: 1607-3710 OT Time Calculation (min): 48 min  Charges: OT General Charges $OT Visit: 1 Visit OT Treatments $Self Care/Home Management : 38-52 mins  Sonia Baller, COTA/L Acute Rehabilitation [phone]   Janice Coffin 06/11/2020, 10:54 AM

## 2020-06-11 NOTE — Progress Notes (Signed)
[place] for Heparin > warfarin Indication: atrial fibrillation  Allergies  Allergen Reactions  . Avandia [Rosiglitazone] Other (See Comments)    Causes muscle weakness.  . Latex Rash  . Sulfa Antibiotics Rash    Patient Measurements: Height: 5\' 8"  (172.7 cm) Weight: 79.1 kg (174 lb 6.1 oz) IBW/kg (Calculated) : 63.9 Heparin Dosing Weight: 79.3 kg  Vital Signs: Temp: 97.8 F (36.6 C) (01/07 1300) Temp Source: Oral (01/07 1300) BP: 100/48 (01/07 1300) Pulse Rate: 67 (01/07 1300)  Labs: Recent Labs    06/09/20 0017 06/09/20 1321 06/10/20 0044 06/10/20 0902 06/11/20 0051  HGB 8.6* 8.8* 8.6*  --  8.6*  HCT 27.9* 27.1* 27.8*  --  26.1*  PLT 227  --  226  --  242  LABPROT 34.3*  --  36.3*  --  29.5*  INR 3.5*  --  3.8*  --  2.9*  CREATININE 2.86* 2.87* 2.94* 2.84* 2.85*    Estimated Creatinine Clearance: 18 mL/min (A) (by C-G formula based on SCr of 2.85 mg/dL (H)).   Medical History: Past Medical History:  Diagnosis Date  . A-fib ([place])   . Acute on chronic combined systolic (congestive) and diastolic (congestive) heart failure ([place])   . Carotid artery occlusion   . DCM (dilated cardiomyopathy) ([place])    EF 30-35% by echo 05/2020  . Diabetes mellitus   . Hyperlipidemia   . Irregular heart beat   . Vitamin D deficiency      Assessment: [name] on warfarin for afib PTA now holding warfarin for [facility]. Pharmacy consulted to start heparin when INR <2. Admit INR 2.8> down to 1.8.   Last Coumadin dose taken 12/28.  Warfarin resumed post cath 1/3, INR up to 3.8>2.9 (dose held on 1/5,6,7). Hgb 8.6, plt 226. Goal to keep INR 2-2.5 for ICD placement 1/10  No s/sx of bleeding - will give some ensure to help trend INR downward.   Warfarin PTA dose: 1.5mg  Fridays, 1mg  all other days (Last seen 05/26/20 by [facility]).    Goal of Therapy:  Heparin level 0.3-0.7 units/ml Monitor platelets by anticoagulation protocol: Yes   Plan:  -No  warfarin today could consider restart warfarin 0.5mg  x1 tomorrow -Daily CBC/Protime -Monitor for bleeding    Bonnita Nasuti Pharm.D. CPP, BCPS Clinical Pharmacist [phone] 06/11/2020 3:49 PM    Please check AMION for all [place] phone numbers After 10:00 PM, call [place] [phone]

## 2020-06-11 NOTE — Progress Notes (Signed)
CARDIAC REHAB PHASE I   PRE:  Rate/Rhythm: 83 afib PVCs  BP:  Supine: 103/65  Sitting:   Standing:    SaO2: 97%RA  MODE:  Ambulation: 160 ft   POST:  Rate/Rhythm: highest 131 mainly around 111 afib  BP:  Supine:   Sitting: 118/64  Standing:    SaO2: 92%RA [id number] Pt agreed to walk before she eats and takes pills as she usually gets nauseated then. Pt walked 160 ft on RA with rolling walker, gait belt use and asst x 1. Worked on taking deep breaths and yawning(which she stated helps the most). Denied dizziness when up. To recliner with call bell and tray after walk. Encouraged participation with PT later. Tolerated well.   Graylon Good, RN BSN  06/11/2020 8:52 AM

## 2020-06-12 DIAGNOSIS — I472 Ventricular tachycardia: Secondary | ICD-10-CM | POA: Diagnosis not present

## 2020-06-12 DIAGNOSIS — N184 Chronic kidney disease, stage 4 (severe): Secondary | ICD-10-CM | POA: Diagnosis not present

## 2020-06-12 DIAGNOSIS — N179 Acute kidney failure, unspecified: Secondary | ICD-10-CM | POA: Diagnosis not present

## 2020-06-12 DIAGNOSIS — I5021 Acute systolic (congestive) heart failure: Secondary | ICD-10-CM | POA: Diagnosis not present

## 2020-06-12 LAB — CBC
HCT: 27 % — ABNORMAL LOW (ref 36.0–46.0)
Hemoglobin: 8.7 g/dL — ABNORMAL LOW (ref 12.0–15.0)
MCH: 30.4 pg (ref 26.0–34.0)
MCHC: 32.2 g/dL (ref 30.0–36.0)
MCV: 94.4 fL (ref 80.0–100.0)
Platelets: 265 10*3/uL (ref 150–400)
RBC: 2.86 MIL/uL — ABNORMAL LOW (ref 3.87–5.11)
RDW: 14.4 % (ref 11.5–15.5)
WBC: 8.3 10*3/uL (ref 4.0–10.5)
nRBC: 0.5 % — ABNORMAL HIGH (ref 0.0–0.2)

## 2020-06-12 LAB — BASIC METABOLIC PANEL
Anion gap: 12 (ref 5–15)
BUN: 75 mg/dL — ABNORMAL HIGH (ref 8–23)
CO2: 21 mmol/L — ABNORMAL LOW (ref 22–32)
Calcium: 8.6 mg/dL — ABNORMAL LOW (ref 8.9–10.3)
Chloride: 102 mmol/L (ref 98–111)
Creatinine, Ser: 2.88 mg/dL — ABNORMAL HIGH (ref 0.44–1.00)
GFR, Estimated: 16 mL/min — ABNORMAL LOW (ref >60)
Glucose, Bld: 113 mg/dL — ABNORMAL HIGH (ref 70–99)
Potassium: 4.2 mmol/L (ref 3.5–5.1)
Sodium: 135 mmol/L (ref 135–145)

## 2020-06-12 LAB — PROTIME-INR
INR: 2.6 — ABNORMAL HIGH (ref 0.8–1.2)
Prothrombin Time: 27.2 seconds — ABNORMAL HIGH (ref 11.4–15.2)

## 2020-06-12 LAB — GLUCOSE, CAPILLARY
Glucose-Capillary: 97 mg/dL (ref 70–99)
Glucose-Capillary: 223 mg/dL — ABNORMAL HIGH (ref 70–99)
Glucose-Capillary: 195 mg/dL — ABNORMAL HIGH (ref 70–99)
Glucose-Capillary: 215 mg/dL — ABNORMAL HIGH (ref 70–99)

## 2020-06-12 LAB — SURGICAL PCR SCREEN
MRSA, PCR: NEGATIVE
Staphylococcus aureus: NEGATIVE

## 2020-06-12 LAB — MAGNESIUM: Magnesium: 2.6 mg/dL — ABNORMAL HIGH (ref 1.7–2.4)

## 2020-06-12 LAB — OCCULT BLOOD X 1 CARD TO LAB, STOOL: Fecal Occult Bld: NEGATIVE

## 2020-06-12 MED ORDER — CHLORHEXIDINE GLUCONATE 4 % EX LIQD
60.0000 mL | Freq: Once | CUTANEOUS | Status: AC
  Administered 2020-06-12: 4 via TOPICAL
  Filled 2020-06-12: qty 60

## 2020-06-12 MED ORDER — LIDOCAINE 5 % EX PTCH
2.0000 | MEDICATED_PATCH | CUTANEOUS | Status: DC
  Administered 2020-06-12 – 2020-06-16 (×5): 2 via TRANSDERMAL
  Filled 2020-06-12 (×5): qty 2

## 2020-06-12 MED ORDER — SODIUM CHLORIDE 0.9 % IV SOLN
80.0000 mg | INTRAVENOUS | Status: AC
  Administered 2020-06-13: 80 mg
  Filled 2020-06-12: qty 2

## 2020-06-12 MED ORDER — SODIUM CHLORIDE 0.9 % IV SOLN: INTRAVENOUS | Status: DC

## 2020-06-12 MED ORDER — CHLORHEXIDINE GLUCONATE 4 % EX LIQD
60.0000 mL | Freq: Once | CUTANEOUS | Status: AC
  Administered 2020-06-13: 4 via TOPICAL
  Filled 2020-06-12: qty 60

## 2020-06-12 MED ORDER — CEFAZOLIN SODIUM-DEXTROSE 2-4 GM/100ML-% IV SOLN
2.0000 g | INTRAVENOUS | Status: AC
  Administered 2020-06-13: 2 g via INTRAVENOUS
  Filled 2020-06-12: qty 100

## 2020-06-12 MED ORDER — TRAMADOL HCL 50 MG PO TABS
50.0000 mg | ORAL_TABLET | Freq: Two times a day (BID) | ORAL | Status: DC | PRN
  Administered 2020-06-12 – 2020-06-15 (×6): 50 mg via ORAL
  Filled 2020-06-12 (×7): qty 1

## 2020-06-12 NOTE — Progress Notes (Signed)
Electrophysiology Rounding Note  Patient Name: Cynthia Hansen Date of Encounter: 06/12/2020  Primary Cardiologist: Larae Grooms, MD Electrophysiologist: None   Subjective   NAEO. AF on tele. Plan for ICD tomorrow. Keep NPO after MN.  Inpatient Medications    Scheduled Meds: . acetaminophen  1,000 mg Oral TID  . amiodarone  200 mg Oral BID  . aspirin  81 mg Oral Daily  . atorvastatin  80 mg Oral Daily  . colestipol  1 g Oral BID  . hydrocortisone cream   Topical QID  . insulin aspart  0-5 Units Subcutaneous QHS  . insulin aspart  0-9 Units Subcutaneous TID WC  . melatonin  3 mg Oral QHS  . mesalamine  4.8 g Oral Q breakfast  . methimazole  5 mg Oral Daily  . midodrine  5 mg Oral TID WC  . ondansetron (ZOFRAN) IV  4 mg Intravenous Once  . sodium chloride flush  3 mL Intravenous Q12H  . sodium chloride flush  3 mL Intravenous Q12H  . Warfarin - Pharmacist Dosing Inpatient   Does not apply q1600   Continuous Infusions: . sodium chloride Stopped (06/12/20 0253)  . sodium chloride    . cefTRIAXone (ROCEPHIN)  IV Stopped (06/11/20 2153)  . ferumoxytol Stopped (06/10/20 1310)   PRN Meds: sodium chloride, acetaminophen, ondansetron (ZOFRAN) IV, polyethylene glycol, sodium chloride flush   Vital Signs    Vitals:   06/11/20 1922 06/11/20 2350 06/12/20 0431 06/12/20 0637  BP: (!) 107/53 (!) 98/52    Pulse: 81 75    Resp: 17 14    Temp: 97.9 F (36.6 C) 97.6 F (36.4 C) 98.6 F (37 C)   TempSrc: Oral Oral Oral   SpO2: 97% 98%    Weight:    78.9 kg  Height:        Intake/Output Summary (Last 24 hours) at 06/12/2020 0737 Last data filed at 06/12/2020 0630 Gross per 24 hour  Intake 693.33 ml  Output 800 ml  Net -106.67 ml   Filed Weights   06/10/20 0630 06/11/20 0642 06/12/20 0637  Weight: 78.5 kg 79.1 kg 78.9 kg    Physical Exam    GEN- The patient is well appearing, alert and oriented x 3 today.   Head- normocephalic, atraumatic Eyes-  Sclera clear,  conjunctiva pink Ears- hearing intact Oropharynx- clear Neck- supple Lungs- Clear to ausculation bilaterally, normal work of breathing Heart- irregularly irregular, no murmurs, rubs or gallops GI- soft, NT, ND, + BS Extremities- no clubbing or cyanosis. No edema Skin- no rash or lesion Psych- euthymic mood, full affect Neuro- strength and sensation are intact  Labs    CBC Recent Labs    06/11/20 0051 06/12/20 0059  WBC 7.0 8.3  HGB 8.6* 8.7*  HCT 26.1* 27.0*  MCV 92.2 94.4  PLT 242 161   Basic Metabolic Panel Recent Labs    06/10/20 0902 06/11/20 0051 06/12/20 0059  NA 134* 134* 135  K 4.2 4.1 4.2  CL 99 101 102  CO2 20* 20* 21*  GLUCOSE 155* 157* 113*  BUN 74* 76* 75*  CREATININE 2.84* 2.85* 2.88*  CALCIUM 8.8* 8.8* 8.6*  MG  --  2.5* 2.6*  PHOS 5.3*  --   --    Liver Function Tests Recent Labs    06/10/20 0902  ALBUMIN 3.2*   No results for input(s): LIPASE, AMYLASE in the last 72 hours. Cardiac Enzymes No results for input(s): CKTOTAL, CKMB, CKMBINDEX, TROPONINI in the last  72 hours.   Telemetry    AF  Radiology    No results found.  Patient Profile     80 y.o. female with a hx of HLD, DM, + smoker quit 2017, PVD (50-69% RICA and patent Left carotid endarterectomy 5/19/10m follow w/Dr. Oneida Alar), AFib (permanent), DCM, CKD (IV) admitted with acute/chronic CHF developed pulseless VT arrest while here.  Assessment & Plan    1. VT 2. Acute on Chronic kidney disease, Stage IV  Plan for ICD implant Sunday AM Please keep NPO after MN  The patient has an ischemic CM (EF 35%), NYHA Class III CHF, and CAD.  He is referred by Dr Aundra Dubin for risk stratification of sudden death and consideration of ICD implantation.  At this time, she meets criteria for ICD implantation for secondary prevention of sudden death.  I have had a thorough discussion with the patient reviewing options.  The patient and their family (if available) have had opportunities to ask  questions and have them answered. The patient and I have decided together through a shared decision making process to proceed with secondary prevention VVI ICD at this time.    Risks, benefits, alternatives to ICD implantation were discussed in detail with the patient today. The patient understands that the risks include but are not limited to bleeding, infection, pneumothorax, perforation, tamponade, vascular damage, renal failure, MI, stroke, death, inappropriate shocks, and lead dislodgement and wishes to proceed.       For questions or updates, please contact [place] Please consult [email or web address] for contact info under Cardiology/STEMI.  Signed, Vickie Epley, MD  06/12/2020, 7:37 AM

## 2020-06-12 NOTE — Progress Notes (Signed)
Patient ID: Cynthia Hansen, female   DOB: 09/09/1940, 80 y.o.   MRN: [id number] P    Advanced Heart Failure Rounding Note  PCP-Cardiologist: Larae Grooms, MD   Subjective:    - 06/05/20  patient had pulseless VT requiring shock x 1 and CPR x 4 minutes.  She had rapid recovery and was not intubated.  Electrolytes stable.  Amiodarone gtt started.  Due to low BP, she was started on norepinephrine 4 peripherally.  Creatinine rose 2.1 => 2.36 and HS-TnI rose to 7990.  Currently, only mild chest wall tenderness post-CPR.   - 06/08/19 RHC demonstrated mildly elevated filling pressures and relatively preserved CO. No LHC due to abnormal renal function. cMRI limited study. Very difficult images due to respiratory artifact and atrial fibrillation.  - 06/09/19 S/P Thoracentesis on R. 700 cc removed.   Creatinine stable at 2.88.  Got 1 dose of Lasix IV yesterday.  This morning, has nausea as well as her chronic neuropathic left arm pain.  Tylenol has not helped.   Remains in rate controlled Afib. No further VT.    Objective:   Weight Range: 78.9 kg Body mass index is 26.45 kg/m.   Vital Signs:   Temp:  [97.6 F (36.4 C)-98.6 F (37 C)] 98.1 F (36.7 C) (01/08 0730) Pulse Rate:  [67-99] 99 (01/08 0730) Resp:  [12-18] 18 (01/08 0730) BP: (98-108)/(48-66) 108/57 (01/08 0730) SpO2:  [96 %-99 %] 97 % (01/08 0730) Weight:  [78.9 kg] 78.9 kg (01/08 0637) Last BM Date: 06/05/20  Weight change: Filed Weights   06/10/20 0630 06/11/20 0642 06/12/20 0637  Weight: 78.5 kg 79.1 kg 78.9 kg    Intake/Output:   Intake/Output Summary (Last 24 hours) at 06/12/2020 1029 Last data filed at 06/12/2020 0800 Gross per 24 hour  Intake 933.33 ml  Output 800 ml  Net 133.33 ml      Physical Exam   General: NAD Neck: JVP 8-9 cm, no thyromegaly or thyroid nodule.  Lungs: Clear to auscultation bilaterally with normal respiratory effort. CV: Nondisplaced PMI.  Heart irregular S1/S2, no S3/S4, no murmur.  No  peripheral edema.  No carotid bruit.  Normal pedal pulses.  Abdomen: Soft, nontender, no hepatosplenomegaly, no distention.  Skin: Intact without lesions or rashes.  Neurologic: Alert and oriented x 3.  Psych: Normal affect. Extremities: No clubbing or cyanosis.  HEENT: Normal.    Telemetry    A Fib 70-80s. No further VT. Personally reviewed.    Labs    CBC Recent Labs    06/11/20 0051 06/12/20 0059  WBC 7.0 8.3  HGB 8.6* 8.7*  HCT 26.1* 27.0*  MCV 92.2 94.4  PLT 242 798   Basic Metabolic Panel Recent Labs    06/10/20 0902 06/11/20 0051 06/12/20 0059  NA 134* 134* 135  K 4.2 4.1 4.2  CL 99 101 102  CO2 20* 20* 21*  GLUCOSE 155* 157* 113*  BUN 74* 76* 75*  CREATININE 2.84* 2.85* 2.88*  CALCIUM 8.8* 8.8* 8.6*  MG  --  2.5* 2.6*  PHOS 5.3*  --   --    Liver Function Tests Recent Labs    06/10/20 0902  ALBUMIN 3.2*   No results for input(s): LIPASE, AMYLASE in the last 72 hours. Cardiac Enzymes No results for input(s): CKTOTAL, CKMB, CKMBINDEX, TROPONINI in the last 72 hours.  BNP: BNP (last 3 results) Recent Labs    05/27/20 0828 06/02/20 1832  BNP 2,213.1* 2,338.9*    ProBNP (last 3 results)  Recent Labs    05/13/20 1550 05/31/20 0939  PROBNP 46,357* 48,555*     D-Dimer No results for input(s): DDIMER in the last 72 hours. Hemoglobin A1C No results for input(s): HGBA1C in the last 72 hours. Fasting Lipid Panel No results for input(s): CHOL, HDL, LDLCALC, TRIG, CHOLHDL, LDLDIRECT in the last 72 hours. Thyroid Function Tests No results for input(s): TSH, T4TOTAL, T3FREE, THYROIDAB in the last 72 hours.  Invalid input(s): FREET3  Other results:   Imaging    No results found.   Medications:     Scheduled Medications: . acetaminophen  1,000 mg Oral TID  . amiodarone  200 mg Oral BID  . aspirin  81 mg Oral Daily  . atorvastatin  80 mg Oral Daily  . chlorhexidine  60 mL Topical Once  . [START ON 06/13/2020] chlorhexidine  60 mL  Topical Once  . colestipol  1 g Oral BID  . [START ON 06/13/2020] gentamicin irrigation  80 mg Irrigation On Call  . hydrocortisone cream   Topical QID  . insulin aspart  0-5 Units Subcutaneous QHS  . insulin aspart  0-9 Units Subcutaneous TID WC  . lidocaine  2 patch Transdermal Q24H  . melatonin  3 mg Oral QHS  . mesalamine  4.8 g Oral Q breakfast  . methimazole  5 mg Oral Daily  . midodrine  5 mg Oral TID WC  . ondansetron (ZOFRAN) IV  4 mg Intravenous Once  . sodium chloride flush  3 mL Intravenous Q12H  . sodium chloride flush  3 mL Intravenous Q12H  . Warfarin - Pharmacist Dosing Inpatient   Does not apply q1600    Infusions: . sodium chloride Stopped (06/12/20 0253)  . sodium chloride    . [START ON 06/13/2020] sodium chloride    . [START ON 06/13/2020] sodium chloride    . [START ON 06/13/2020]  ceFAZolin (ANCEF) IV    . cefTRIAXone (ROCEPHIN)  IV Stopped (06/11/20 2153)  . ferumoxytol Stopped (06/10/20 1310)    PRN Medications: sodium chloride, acetaminophen, ondansetron (ZOFRAN) IV, polyethylene glycol, sodium chloride flush, traMADol   Assessment/Plan   1. Acute on chronic systolic CHF: Cardiolite in 11/21 with EF 26%, inferior fixed perfusion defect with no ischemia.  Echo in 12/21 with EF 35-40%, moderate LV dilation, mildly decreased RV function. Cause of cardiomyopathy uncertain but there is concern for ischemic cardiomyopathy. Cardiac MRI was difficult study, EF 25-30% estimated without definitive LGE. Worsening CHF recently complicated by CKD stage IV. She has had NYHA class III symptoms.  She diuresed well initially but had VT arrest.  RHC this admit w/ mildly elevated filling pressures, mild pulmonary HTN and relatively preserved CO. Creatinine stable at 2.88, mildly elevated.  Weight down 1 lb. SBP 90s-100s on midodrine.  - Nauseated, no diuretics today.   - Coreg on hold for hypotension and dapagliflozin stopped with AKI.  - Continue midodrine 5 mg tid  - Will be  limited in terms of HF meds we can use with CKD 4, history of hyperkalemia, and soft BP.  For now, no ARNI/ARB/digoxin/spironolactone.   - Will be [place] candidate. - Ideally, would have coronary angiography to look for CAD as cause of cardiomyopathy and VT.  However, not candidate at this time in absence of ACS given AKI.  2. AKI on CKD Stage IV: Creatinine has plateaued. RHC showed relatively preserved cardiac output.  - Continue midodrine to keep BP up.  - Nephrology following and conducting w/u. Appreciate their assistance.  -  No diuretic today.  3. Chronic atrial fibrillation: This appears permanent, x years.  Has biatrial severe enlargement and unlikely to hold NSR with DCCV attempt. INR 2.9 today.  - Warfarin dosing per pharmacy, EP request INR in 2-2.5 range for ICD.  4. Low TSH: Concern for hyperthyroidism.  Free T4 is high, but free T3 is normal. Thyroid stimulating antibody negative  - Discussed abnormal TFTs with endocrinology, started on  low dose methimazole 5 mg qam. 5. CAD: NSTEMI with HS-TnI up to 7990 post-VT arrest and CPR.  As above, have been concerned for CAD as cause of cardiomyopathy.  ?Rise in troponin due to demand ischemia versus ACS => no ischemic pain (just chest wall tenderness post-CPR) and no ECG change (old ASMI as in past).    - Continue atorvastatin 80 mg daily.  - Continue ASA 81 daily.  - Ideally, would do coronary angiography but think we need improvement in creatinine for that in absence of ACS.  - cMRI w/ poor imaging, Very difficult delayed enhancement images. Possible subtle subendocardial LGE in the mid inferior wall seen on the short axis images, but this could not be confirmed in the long axis images. There does not appear to be a large amount of scar. 6. Cardiac arrest: Pulseless VT.  Electrolytes stable. No chest pain or dyspnea prior.  ?Cause => suspect scar-mediated.  - Continue amiodarone 200 mg po bid.  - EP following. Plan for ICD tomorrow.  7.  Pulmonary: RLL PNA post-arrest by CXR.  Suspect aspiration. Afebrile.  - She is now on ceftriaxone, stop after 7 days. 8. Pleural Effusion: S/P R Thoracentesis with 700 cc removed. Transudate from CHF.  9. Hyponatremia: Improved.  10. Anemia: Hgb 8.7.  ? Anemia of chronic disease from CKD. No overt GI bleeding but has Fe deficiency.  - Await FOBT.  - Received feraheme.  11. Neuropathic pain left arm:  Chronic.  Tylenol does not help, she has had side effects with gabapentin.  - Trial of tramadol.    Mobilize, will need CIR after ICD.   Length of Stay: Pink, MD  06/12/2020, 10:29 AM  Advanced Heart Failure Team Pager [phone] (M-F; 7a - 4p)  Please contact [facility] for night-coverage after hours (4p -7a ) and weekends on [email or web address]

## 2020-06-12 NOTE — Progress Notes (Signed)
[facility] for Heparin > warfarin Indication: atrial fibrillation  Allergies  Allergen Reactions  . Avandia [Rosiglitazone] Other (See Comments)    Causes muscle weakness.  . Latex Rash  . Sulfa Antibiotics Rash    Patient Measurements: Height: 5\' 8"  (172.7 cm) Weight: 78.9 kg (173 lb 15.1 oz) IBW/kg (Calculated) : 63.9 Heparin Dosing Weight: 79.3 kg  Vital Signs: Temp: 97.6 F (36.4 C) (01/08 1025) Temp Source: Oral (01/08 1025) BP: 92/53 (01/08 1025) Pulse Rate: 100 (01/08 1025)  Labs: Recent Labs    06/10/20 0044 06/10/20 0902 06/11/20 0051 06/12/20 0059  HGB 8.6*  --  8.6* 8.7*  HCT 27.8*  --  26.1* 27.0*  PLT 226  --  242 265  LABPROT 36.3*  --  29.5* 27.2*  INR 3.8*  --  2.9* 2.6*  CREATININE 2.94* 2.84* 2.85* 2.88*    Estimated Creatinine Clearance: 17.8 mL/min (A) (by C-G formula based on SCr of 2.88 mg/dL (H)).   Medical History: Past Medical History:  Diagnosis Date  . A-fib ([place])   . Acute on chronic combined systolic (congestive) and diastolic (congestive) heart failure ([place])   . Carotid artery occlusion   . DCM (dilated cardiomyopathy) ([place])    EF 30-35% by echo 05/2020  . Diabetes mellitus   . Hyperlipidemia   . Irregular heart beat   . Vitamin D deficiency      Assessment: [name] on warfarin for afib PTA now holding warfarin for [facility]. Pharmacy consulted to start heparin when INR <2. Admit INR 2.8> down to 1.8.  And heparin started > heparin stopped post cath  Warfarin resumed post cath 1/3, INR up to 3.8>2.9>2.6 (dose held because patient nauseated and not eating) Hgb 8.7stable , plt 200s. Goal to keep INR 2-2.5 for ICD placement 1/10  No s/sx of bleeding   Warfarin PTA dose: 1.5mg  Fridays, 1mg  all other days (Last seen 05/26/20 by [facility]).    Goal of Therapy:  Heparin level 0.3-0.7 units/ml Monitor platelets by anticoagulation protocol: Yes   Plan:  -No warfarin today could consider restart  warfarin 0.5mg  x1 tomorrow -Daily CBC/Protime -Monitor for bleeding    Bonnita Nasuti Pharm.D. CPP, BCPS Clinical Pharmacist [phone] 06/12/2020 3:48 PM    Please check AMION for all [place] phone numbers After 10:00 PM, call [place] [phone]

## 2020-06-12 NOTE — Progress Notes (Signed)
[facility] Progress Note   Assessment/ Plan:   1. AKI on CKD Stage IV - Baseline appears 1.8-2.3. AKI 2/2 ATN w/ arrest but also related to CHF and low Bps leading to decrease filtration. This is likely why Crt has plateaued at 2.9. - Volume status stable today; no need for lasix from my standpoint given poor PO at this time; can restart PRN - Continue midodrine 5 mg TID - Continue holding Farxiga  2. Acute systolic CHF w/ cardiac arrest: TTE 12/21 with EF 35% - 40% - AHF following - Plan for ICD possibly monday 3. Iron Deficiency anemia: CKD may be contributing. S/p feraheme x1 and redose next week. 4. Hyponatremia: now resolved. Na normal today 5. Nutrition:Low sodium diet   Dispo: remain inpatient awaiting ICD  Subjective:   Cynthia Hansen is a 80 y.o. with PMH of Afib, DM II, HTN, h/o CEA and new dx of dilated cardiomyopathy with EF 30-35% who is seen in consultation for AKI on CKD. She is on hospital day 10.  Chest pain continues. No sob. No obvious response to lasix. Some nausea today.    Objective:   BP (!) 108/57 (BP Location: Left Arm)   Pulse 99   Temp 98.1 F (36.7 C) (Oral)   Resp 18   Ht 5\' 8"  (1.727 m)   Wt 78.9 kg   SpO2 97%   BMI 26.45 kg/m   Physical Exam: YNW:GNFA kept, NAD CVS: Regularly irregular, no murmur  Resp: Normal work of breathing , bilateral chest rise Abd: Soft, NT Ext: No edema, warm   Labs: BMET Recent Labs  Lab 06/08/20 0130 06/09/20 0017 06/09/20 1321 06/10/20 0044 06/10/20 0902 06/11/20 0051 06/12/20 0059  NA 136 135 133* 126* 134* 134* 135  K 4.1 3.9 3.9 3.8 4.2 4.1 4.2  CL 98 99 100 95* 99 101 102  CO2 21* 23 20* 19* 20* 20* 21*  GLUCOSE 143* 139* 206* 173* 155* 157* 113*  BUN 61* 69* 70* 74* 74* 76* 75*  CREATININE 2.70* 2.86* 2.87* 2.94* 2.84* 2.85* 2.88*  CALCIUM 8.7* 8.5* 8.6* 8.0* 8.8* 8.8* 8.6*  PHOS  --   --   --   --  5.3*  --   --    CBC Recent Labs  Lab 06/09/20 0017 06/09/20 1321  06/10/20 0044 06/11/20 0051 06/12/20 0059  WBC 7.8  --  7.6 7.0 8.3  HGB 8.6* 8.8* 8.6* 8.6* 8.7*  HCT 27.9* 27.1* 27.8* 26.1* 27.0*  MCV 94.3  --  94.2 92.2 94.4  PLT 227  --  226 242 265      Medications:    . acetaminophen  1,000 mg Oral TID  . amiodarone  200 mg Oral BID  . aspirin  81 mg Oral Daily  . atorvastatin  80 mg Oral Daily  . chlorhexidine  60 mL Topical Once  . [START ON 06/13/2020] chlorhexidine  60 mL Topical Once  . colestipol  1 g Oral BID  . [START ON 06/13/2020] gentamicin irrigation  80 mg Irrigation On Call  . hydrocortisone cream   Topical QID  . insulin aspart  0-5 Units Subcutaneous QHS  . insulin aspart  0-9 Units Subcutaneous TID WC  . melatonin  3 mg Oral QHS  . mesalamine  4.8 g Oral Q breakfast  . methimazole  5 mg Oral Daily  . midodrine  5 mg Oral TID WC  . ondansetron (ZOFRAN) IV  4 mg Intravenous Once  . sodium  chloride flush  3 mL Intravenous Q12H  . sodium chloride flush  3 mL Intravenous Q12H  . Warfarin - Pharmacist Dosing Inpatient   Does not apply [place]  06/12/2020, 8:24 AM

## 2020-06-12 NOTE — Progress Notes (Signed)
CARDIAC REHAB PHASE I   PRE:  Rate/Rhythm: 92 afib  BP:  Supine: 108/57  Sitting:   Standing:    SaO2: 96%RA  MODE:  Ambulation:  chair ft   POST:  Rate/Rhythm: 109  [id number] Pt not feeling up to walking. Did not have a good night. Stated back is hurting and has had some nausea. Agreeable to get to recliner. Assisted to recliner with rolling walker and asst x 1. Call bell in reach.    Graylon Good, RN BSN  06/12/2020 8:32 AM

## 2020-06-13 ENCOUNTER — Ambulatory Visit (HOSPITAL_COMMUNITY): Admission: RE | Admit: 2020-06-13 | Payer: PPO | Source: Home / Self Care | Admitting: Cardiology

## 2020-06-13 ENCOUNTER — Encounter (HOSPITAL_COMMUNITY)
Admission: AD | Disposition: A | Discharge status: Rehabilitation Facility | Payer: Self-pay | Source: Ambulatory Visit | Attending: Cardiology

## 2020-06-13 DIAGNOSIS — I472 Ventricular tachycardia: Secondary | ICD-10-CM | POA: Diagnosis not present

## 2020-06-13 DIAGNOSIS — I428 Other cardiomyopathies: Secondary | ICD-10-CM | POA: Diagnosis not present

## 2020-06-13 HISTORY — PX: ICD IMPLANT: EP1208

## 2020-06-13 LAB — BASIC METABOLIC PANEL
Anion gap: 13 (ref 5–15)
BUN: 77 mg/dL — ABNORMAL HIGH (ref 8–23)
CO2: 20 mmol/L — ABNORMAL LOW (ref 22–32)
Calcium: 8.8 mg/dL — ABNORMAL LOW (ref 8.9–10.3)
Chloride: 101 mmol/L (ref 98–111)
Creatinine, Ser: 2.85 mg/dL — ABNORMAL HIGH (ref 0.44–1.00)
GFR, Estimated: 16 mL/min — ABNORMAL LOW (ref >60)
Glucose, Bld: 155 mg/dL — ABNORMAL HIGH (ref 70–99)
Potassium: 4.4 mmol/L (ref 3.5–5.1)
Sodium: 134 mmol/L — ABNORMAL LOW (ref 135–145)

## 2020-06-13 LAB — CBC
HCT: 26.9 % — ABNORMAL LOW (ref 36.0–46.0)
Hemoglobin: 8.7 g/dL — ABNORMAL LOW (ref 12.0–15.0)
MCH: 30.1 pg (ref 26.0–34.0)
MCHC: 32.3 g/dL (ref 30.0–36.0)
MCV: 93.1 fL (ref 80.0–100.0)
Platelets: 298 10*3/uL (ref 150–400)
RBC: 2.89 MIL/uL — ABNORMAL LOW (ref 3.87–5.11)
RDW: 14.6 % (ref 11.5–15.5)
WBC: 9.7 10*3/uL (ref 4.0–10.5)
nRBC: 1 % — ABNORMAL HIGH (ref 0.0–0.2)

## 2020-06-13 LAB — GLUCOSE, CAPILLARY
Glucose-Capillary: 129 mg/dL — ABNORMAL HIGH (ref 70–99)
Glucose-Capillary: 120 mg/dL — ABNORMAL HIGH (ref 70–99)
Glucose-Capillary: 273 mg/dL — ABNORMAL HIGH (ref 70–99)
Glucose-Capillary: 177 mg/dL — ABNORMAL HIGH (ref 70–99)

## 2020-06-13 LAB — PROTIME-INR
INR: 2.5 — ABNORMAL HIGH (ref 0.8–1.2)
Prothrombin Time: 26.5 seconds — ABNORMAL HIGH (ref 11.4–15.2)

## 2020-06-13 LAB — MAGNESIUM: Magnesium: 2.8 mg/dL — ABNORMAL HIGH (ref 1.7–2.4)

## 2020-06-13 SURGERY — ICD IMPLANT

## 2020-06-13 MED ORDER — FENTANYL CITRATE (PF) 100 MCG/2ML IJ SOLN
INTRAMUSCULAR | Status: AC
  Filled 2020-06-13: qty 2

## 2020-06-13 MED ORDER — MIDAZOLAM HCL 5 MG/5ML IJ SOLN
INTRAMUSCULAR | Status: DC | PRN
  Administered 2020-06-13: 2 mg via INTRAVENOUS

## 2020-06-13 MED ORDER — ACETAMINOPHEN 325 MG PO TABS
325.0000 mg | ORAL_TABLET | ORAL | Status: DC | PRN
  Administered 2020-06-14: 650 mg via ORAL
  Filled 2020-06-13: qty 2

## 2020-06-13 MED ORDER — HEPARIN (PORCINE) IN NACL 1000-0.9 UT/500ML-% IV SOLN
INTRAVENOUS | Status: AC
  Filled 2020-06-13: qty 500

## 2020-06-13 MED ORDER — LIDOCAINE HCL (PF) 1 % IJ SOLN
INTRAMUSCULAR | Status: DC | PRN
  Administered 2020-06-13: 50 mL

## 2020-06-13 MED ORDER — CEFAZOLIN SODIUM-DEXTROSE 2-4 GM/100ML-% IV SOLN
INTRAVENOUS | Status: AC
  Filled 2020-06-13: qty 100

## 2020-06-13 MED ORDER — ONDANSETRON HCL 4 MG/2ML IJ SOLN
4.0000 mg | Freq: Four times a day (QID) | INTRAMUSCULAR | Status: DC | PRN
  Administered 2020-06-13 – 2020-06-16 (×5): 4 mg via INTRAVENOUS
  Filled 2020-06-13 (×5): qty 2

## 2020-06-13 MED ORDER — FENTANYL CITRATE (PF) 100 MCG/2ML IJ SOLN
INTRAMUSCULAR | Status: DC | PRN
  Administered 2020-06-13: 25 ug via INTRAVENOUS

## 2020-06-13 MED ORDER — SODIUM CHLORIDE 0.9 % IV SOLN
INTRAVENOUS | Status: AC
  Filled 2020-06-13: qty 2

## 2020-06-13 MED ORDER — MIDAZOLAM HCL 5 MG/5ML IJ SOLN
INTRAMUSCULAR | Status: AC
  Filled 2020-06-13: qty 5

## 2020-06-13 MED ORDER — CEFAZOLIN SODIUM-DEXTROSE 1-4 GM/50ML-% IV SOLN: 1.0000 g | Freq: Four times a day (QID) | INTRAVENOUS | Status: DC

## 2020-06-13 MED ORDER — LIDOCAINE HCL (PF) 1 % IJ SOLN
INTRAMUSCULAR | Status: AC
  Filled 2020-06-13: qty 60

## 2020-06-13 SURGICAL SUPPLY — 6 items
CABLE SURGICAL S-101-97-12 (CABLE) ×2 IMPLANT
ICD GALLANT DR CDDRA500Q (ICD Generator) ×2 IMPLANT
LEAD DURATA 7122Q-58CM (Lead) ×2 IMPLANT
PAD PRO RADIOLUCENT 2001M-C (PAD) ×2 IMPLANT
SHEATH 7FR PRELUDE SNAP 13 (SHEATH) ×2 IMPLANT
TRAY PACEMAKER INSERTION (PACKS) ×2 IMPLANT

## 2020-06-13 NOTE — Interval H&P Note (Signed)
History and Physical Interval Note:  06/13/2020 11:14 AM  Cynthia Hansen  has presented today for surgery, with the diagnosis of vt.  The various methods of treatment have been discussed with the patient and family. After consideration of risks, benefits and other options for treatment, the patient has consented to  Procedure(s): ICD IMPLANT (N/A) as a surgical intervention.  The patient's history has been reviewed, patient examined, no change in status, stable for surgery.  I have reviewed the patient's chart and labs.  Questions were answered to the patient's satisfaction.     Cristopher Peru

## 2020-06-13 NOTE — Progress Notes (Signed)
Progress Note  Patient Name: Cynthia Hansen Date of Encounter: 06/13/2020  Primary Cardiologist: Larae Grooms, MD   Subjective   No chest pain. Dyspnea is improved.   Inpatient Medications    Scheduled Meds: . acetaminophen  1,000 mg Oral TID  . amiodarone  200 mg Oral BID  . aspirin  81 mg Oral Daily  . atorvastatin  80 mg Oral Daily  . colestipol  1 g Oral BID  . gentamicin irrigation  80 mg Irrigation On Call  . hydrocortisone cream   Topical QID  . insulin aspart  0-5 Units Subcutaneous QHS  . insulin aspart  0-9 Units Subcutaneous TID WC  . lidocaine  2 patch Transdermal Q24H  . melatonin  3 mg Oral QHS  . mesalamine  4.8 g Oral Q breakfast  . methimazole  5 mg Oral Daily  . midodrine  5 mg Oral TID WC  . ondansetron (ZOFRAN) IV  4 mg Intravenous Once  . sodium chloride flush  3 mL Intravenous Q12H  . sodium chloride flush  3 mL Intravenous Q12H  . Warfarin - Pharmacist Dosing Inpatient   Does not apply q1600   Continuous Infusions: . sodium chloride 10 mL/hr at 06/13/20 0348  . sodium chloride    . sodium chloride 25 mL/hr at 06/13/20 0744  . sodium chloride 25 mL/hr at 06/13/20 0547  .  ceFAZolin (ANCEF) IV    . cefTRIAXone (ROCEPHIN)  IV 200 mL/hr at 06/13/20 0000  . ferumoxytol Stopped (06/10/20 1310)   PRN Meds: sodium chloride, acetaminophen, ondansetron (ZOFRAN) IV, polyethylene glycol, sodium chloride flush, traMADol   Vital Signs    Vitals:   06/12/20 1939 06/12/20 2306 06/13/20 0348 06/13/20 0717  BP:  120/62 (!) 103/58 (!) 98/59  Pulse:  73 80   Resp:  15 15   Temp:  (!) 97.5 F (36.4 C) 97.7 F (36.5 C) 97.6 F (36.4 C)  TempSrc:  Oral Oral Oral  SpO2: 97% 95% 97%   Weight:      Height:        Intake/Output Summary (Last 24 hours) at 06/13/2020 0919 Last data filed at 06/13/2020 0744 Gross per 24 hour  Intake 505.95 ml  Output 600 ml  Net -94.05 ml   Filed Weights   06/10/20 0630 06/11/20 0642 06/12/20 0637  Weight: 78.5 kg  79.1 kg 78.9 kg    Telemetry    Atrial fib with a CVR - Personally Reviewed  ECG    none - Personally Reviewed  Physical Exam   GEN: No acute distress.   Neck: No JVD Cardiac: IRRR, no murmurs, rubs, or gallops.  Respiratory: Clear to auscultation bilaterally. GI: Soft, nontender, non-distended  MS: No edema; No deformity. Neuro:  Nonfocal  Psych: Normal affect   Labs    Chemistry Recent Labs  Lab 06/08/20 1112 06/09/20 0017 06/10/20 0902 06/11/20 0051 06/12/20 0059 06/13/20 0105  NA  --    < > 134* 134* 135 134*  K  --    < > 4.2 4.1 4.2 4.4  CL  --    < > 99 101 102 101  CO2  --    < > 20* 20* 21* 20*  GLUCOSE  --    < > 155* 157* 113* 155*  BUN  --    < > 74* 76* 75* 77*  CREATININE  --    < > 2.84* 2.85* 2.88* 2.85*  CALCIUM  --    < >  8.8* 8.8* 8.6* 8.8*  PROT 5.9*  --   --   --   --   --   ALBUMIN 3.0*  --  3.2*  --   --   --   AST 21  --   --   --   --   --   ALT 22  --   --   --   --   --   ALKPHOS 57  --   --   --   --   --   BILITOT 0.6  --   --   --   --   --   GFRNONAA  --    < > 16* 16* 16* 16*  ANIONGAP  --    < > 15 13 12 13    < > = values in this interval not displayed.     Hematology Recent Labs  Lab 06/11/20 0051 06/12/20 0059 06/13/20 0105  WBC 7.0 8.3 9.7  RBC 2.83* 2.86* 2.89*  HGB 8.6* 8.7* 8.7*  HCT 26.1* 27.0* 26.9*  MCV 92.2 94.4 93.1  MCH 30.4 30.4 30.1  MCHC 33.0 32.2 32.3  RDW 14.1 14.4 14.6  PLT 242 265 298    Cardiac EnzymesNo results for input(s): TROPONINI in the last 168 hours. No results for input(s): TROPIPOC in the last 168 hours.   BNPNo results for input(s): BNP, PROBNP in the last 168 hours.   DDimer No results for input(s): DDIMER in the last 168 hours.   Radiology    No results found.  Cardiac Studies   2D echo reviewed  Patient Profile     80 y.o. female admitted with VT, CHF, perm atrial fib for ICD  Assessment & Plan    1. VT - she has had no additional VT. She will continue  amiodarone.  2. Atrial fib - her rates are well controlled.  3. Chronic systolic heart failure - she is on maximal meds. She cannot take an ACE/ARB due to renal failure 4. Stage 4 renal failure - her creatinine is stable. We will follow.   For questions or updates, please contact [place] Please consult [email or web address] for contact info under Cardiology/STEMI.      Signed, Cristopher Peru, MD  06/13/2020, 9:19 AM  Patient ID: Cynthia Hansen, female   DOB: 29-Oct-1940, 80 y.o.   MRN: [id number]

## 2020-06-13 NOTE — Interval H&P Note (Signed)
History and Physical Interval Note:  06/13/2020 11:13 AM  Cynthia Hansen  has presented today for surgery, with the diagnosis of vt.  The various methods of treatment have been discussed with the patient and family. After consideration of risks, benefits and other options for treatment, the patient has consented to  Procedure(s): ICD IMPLANT (N/A) as a surgical intervention.  The patient's history has been reviewed, patient examined, no change in status, stable for surgery.  I have reviewed the patient's chart and labs.  Questions were answered to the patient's satisfaction.     Cristopher Peru

## 2020-06-13 NOTE — Progress Notes (Signed)
[facility] for Heparin > warfarin Indication: atrial fibrillation  Allergies  Allergen Reactions  . Avandia [Rosiglitazone] Other (See Comments)    Causes muscle weakness.  . Latex Rash  . Sulfa Antibiotics Rash    Patient Measurements: Height: 5\' 8"  (172.7 cm) Weight: 78.9 kg (173 lb 15.1 oz) IBW/kg (Calculated) : 63.9 Heparin Dosing Weight: 79.3 kg  Vital Signs: Temp: 97.5 F (36.4 C) (01/09 1129) Temp Source: Oral (01/09 1129) BP: 106/71 (01/09 1300) Pulse Rate: 96 (01/09 1300)  Labs: Recent Labs    06/11/20 0051 06/12/20 0059 06/13/20 0105  HGB 8.6* 8.7* 8.7*  HCT 26.1* 27.0* 26.9*  PLT 242 265 298  LABPROT 29.5* 27.2* 26.5*  INR 2.9* 2.6* 2.5*  CREATININE 2.85* 2.88* 2.85*    Estimated Creatinine Clearance: 18 mL/min (A) (by C-G formula based on SCr of 2.85 mg/dL (H)).   Medical History: Past Medical History:  Diagnosis Date  . A-fib ([place])   . Acute on chronic combined systolic (congestive) and diastolic (congestive) heart failure ([place])   . Carotid artery occlusion   . DCM (dilated cardiomyopathy) ([place])    EF 30-35% by echo 05/2020  . Diabetes mellitus   . Hyperlipidemia   . Irregular heart beat   . Vitamin D deficiency      Assessment: [name] on warfarin for afib PTA now holding warfarin for [facility]. Pharmacy consulted to start heparin when INR <2. Admit INR 2.8> down to 1.8.  And heparin started > heparin stopped post cath   Warfarin resumed post cath 1/3 and received 2 doses. INR went up to 3.8 so warfarin has been held since 1/5, INR now down to 2.5. She is now s/p ICD placement this morning. Hgb low stable 8.7, plts 298. No overt bleeding noted. Will continue to hold warfarin tonight.  Warfarin PTA dose: 1.5mg  Fridays, 1mg  all other days (Last seen 05/26/20 by [facility]).    Goal of Therapy:  INR 2-3 Monitor platelets by anticoagulation protocol: Yes   Plan:  -Hold warfarin tonight -Daily  CBC/Protime -Monitor for bleeding  Richardine Service, PharmD, BCPS PGY2 Cardiology Pharmacy Resident Phone: [phone] 06/13/2020  3:01 PM  Please check [email or web address] for unit-specific pharmacy phone numbers.

## 2020-06-13 NOTE — H&P (View-Only) (Signed)
Progress Note  Patient Name: Cynthia Hansen Date of Encounter: 06/13/2020  Primary Cardiologist: Larae Grooms, MD   Subjective   No chest pain. Dyspnea is improved.   Inpatient Medications    Scheduled Meds: . acetaminophen  1,000 mg Oral TID  . amiodarone  200 mg Oral BID  . aspirin  81 mg Oral Daily  . atorvastatin  80 mg Oral Daily  . colestipol  1 g Oral BID  . gentamicin irrigation  80 mg Irrigation On Call  . hydrocortisone cream   Topical QID  . insulin aspart  0-5 Units Subcutaneous QHS  . insulin aspart  0-9 Units Subcutaneous TID WC  . lidocaine  2 patch Transdermal Q24H  . melatonin  3 mg Oral QHS  . mesalamine  4.8 g Oral Q breakfast  . methimazole  5 mg Oral Daily  . midodrine  5 mg Oral TID WC  . ondansetron (ZOFRAN) IV  4 mg Intravenous Once  . sodium chloride flush  3 mL Intravenous Q12H  . sodium chloride flush  3 mL Intravenous Q12H  . Warfarin - Pharmacist Dosing Inpatient   Does not apply q1600   Continuous Infusions: . sodium chloride 10 mL/hr at 06/13/20 0348  . sodium chloride    . sodium chloride 25 mL/hr at 06/13/20 0744  . sodium chloride 25 mL/hr at 06/13/20 0547  .  ceFAZolin (ANCEF) IV    . cefTRIAXone (ROCEPHIN)  IV 200 mL/hr at 06/13/20 0000  . ferumoxytol Stopped (06/10/20 1310)   PRN Meds: sodium chloride, acetaminophen, ondansetron (ZOFRAN) IV, polyethylene glycol, sodium chloride flush, traMADol   Vital Signs    Vitals:   06/12/20 1939 06/12/20 2306 06/13/20 0348 06/13/20 0717  BP:  120/62 (!) 103/58 (!) 98/59  Pulse:  73 80   Resp:  15 15   Temp:  (!) 97.5 F (36.4 C) 97.7 F (36.5 C) 97.6 F (36.4 C)  TempSrc:  Oral Oral Oral  SpO2: 97% 95% 97%   Weight:      Height:        Intake/Output Summary (Last 24 hours) at 06/13/2020 0919 Last data filed at 06/13/2020 0744 Gross per 24 hour  Intake 505.95 ml  Output 600 ml  Net -94.05 ml   Filed Weights   06/10/20 0630 06/11/20 0642 06/12/20 0637  Weight: 78.5 kg  79.1 kg 78.9 kg    Telemetry    Atrial fib with a CVR - Personally Reviewed  ECG    none - Personally Reviewed  Physical Exam   GEN: No acute distress.   Neck: No JVD Cardiac: IRRR, no murmurs, rubs, or gallops.  Respiratory: Clear to auscultation bilaterally. GI: Soft, nontender, non-distended  MS: No edema; No deformity. Neuro:  Nonfocal  Psych: Normal affect   Labs    Chemistry Recent Labs  Lab 06/08/20 1112 06/09/20 0017 06/10/20 0902 06/11/20 0051 06/12/20 0059 06/13/20 0105  NA  --    < > 134* 134* 135 134*  K  --    < > 4.2 4.1 4.2 4.4  CL  --    < > 99 101 102 101  CO2  --    < > 20* 20* 21* 20*  GLUCOSE  --    < > 155* 157* 113* 155*  BUN  --    < > 74* 76* 75* 77*  CREATININE  --    < > 2.84* 2.85* 2.88* 2.85*  CALCIUM  --    < >  8.8* 8.8* 8.6* 8.8*  PROT 5.9*  --   --   --   --   --   ALBUMIN 3.0*  --  3.2*  --   --   --   AST 21  --   --   --   --   --   ALT 22  --   --   --   --   --   ALKPHOS 57  --   --   --   --   --   BILITOT 0.6  --   --   --   --   --   GFRNONAA  --    < > 16* 16* 16* 16*  ANIONGAP  --    < > 15 13 12 13    < > = values in this interval not displayed.     Hematology Recent Labs  Lab 06/11/20 0051 06/12/20 0059 06/13/20 0105  WBC 7.0 8.3 9.7  RBC 2.83* 2.86* 2.89*  HGB 8.6* 8.7* 8.7*  HCT 26.1* 27.0* 26.9*  MCV 92.2 94.4 93.1  MCH 30.4 30.4 30.1  MCHC 33.0 32.2 32.3  RDW 14.1 14.4 14.6  PLT 242 265 298    Cardiac EnzymesNo results for input(s): TROPONINI in the last 168 hours. No results for input(s): TROPIPOC in the last 168 hours.   BNPNo results for input(s): BNP, PROBNP in the last 168 hours.   DDimer No results for input(s): DDIMER in the last 168 hours.   Radiology    No results found.  Cardiac Studies   2D echo reviewed  Patient Profile     80 y.o. female admitted with VT, CHF, perm atrial fib for ICD  Assessment & Plan    1. VT - she has had no additional VT. She will continue  amiodarone.  2. Atrial fib - her rates are well controlled.  3. Chronic systolic heart failure - she is on maximal meds. She cannot take an ACE/ARB due to renal failure 4. Stage 4 renal failure - her creatinine is stable. We will follow.   For questions or updates, please contact [place] Please consult [email or web address] for contact info under Cardiology/STEMI.      Signed, Cristopher Peru, MD  06/13/2020, 9:19 AM  Patient ID: Baldemar Lenis, female   DOB: March 06, 1941, 80 y.o.   MRN: [id number]

## 2020-06-13 NOTE — Progress Notes (Signed)
[facility] Progress Note   Assessment/ Plan:   1. AKI on CKD Stage IV - Baseline appears 1.8-2.3. AKI 2/2 ATN w/ arrest but also related to CHF and low Bps leading to decrease filtration. This is likely why Crt has plateaued at 2.9. - Volume status stable today; could consider restarting some lasix today or tomorrow - Continue midodrine 5 mg TID - Continue holding Farxiga  2. Acute systolic CHF w/ cardiac arrest: TTE 12/21 with EF 35% - 40% - AHF following - Plan for ICD; patient says will occur today 3. Iron Deficiency anemia: CKD may be contributing. S/p feraheme x1 and redose next week. 4. Hyponatremia: Na much improved at 134 5. Nutrition:Low sodium diet   Dispo: remain inpatient awaiting ICD  Subjective:   Cynthia Hansen is a 80 y.o. with PMH of Afib, DM II, HTN, h/o CEA and new dx of dilated cardiomyopathy with EF 30-35% who is seen in consultation for AKI on CKD. She is on hospital day 11.  Mild chest pain. Nausea improved. Plans for ICD today   Objective:   BP (!) 98/59 (BP Location: Right Arm)   Pulse 80   Temp 97.6 F (36.4 C) (Oral)   Resp 15   Ht 5\' 8"  (1.727 m)   Wt 78.9 kg   SpO2 97%   BMI 26.45 kg/m   Physical Exam: PXT:GGYIR in bed, NAD CVS: normal rate, no murmur  Resp: Normal work of breathing , bilateral chest rise Abd: Soft, NT Ext: No edema, warm   Labs: BMET Recent Labs  Lab 06/09/20 0017 06/09/20 1321 06/10/20 0044 06/10/20 0902 06/11/20 0051 06/12/20 0059 06/13/20 0105  NA 135 133* 126* 134* 134* 135 134*  K 3.9 3.9 3.8 4.2 4.1 4.2 4.4  CL 99 100 95* 99 101 102 101  CO2 23 20* 19* 20* 20* 21* 20*  GLUCOSE 139* 206* 173* 155* 157* 113* 155*  BUN 69* 70* 74* 74* 76* 75* 77*  CREATININE 2.86* 2.87* 2.94* 2.84* 2.85* 2.88* 2.85*  CALCIUM 8.5* 8.6* 8.0* 8.8* 8.8* 8.6* 8.8*  PHOS  --   --   --  5.3*  --   --   --    CBC Recent Labs  Lab 06/10/20 0044 06/11/20 0051 06/12/20 0059 06/13/20 0105  WBC 7.6 7.0 8.3  9.7  HGB 8.6* 8.6* 8.7* 8.7*  HCT 27.8* 26.1* 27.0* 26.9*  MCV 94.2 92.2 94.4 93.1  PLT 226 242 265 298      Medications:    . acetaminophen  1,000 mg Oral TID  . amiodarone  200 mg Oral BID  . aspirin  81 mg Oral Daily  . atorvastatin  80 mg Oral Daily  . colestipol  1 g Oral BID  . gentamicin irrigation  80 mg Irrigation On Call  . hydrocortisone cream   Topical QID  . insulin aspart  0-5 Units Subcutaneous QHS  . insulin aspart  0-9 Units Subcutaneous TID WC  . lidocaine  2 patch Transdermal Q24H  . melatonin  3 mg Oral QHS  . mesalamine  4.8 g Oral Q breakfast  . methimazole  5 mg Oral Daily  . midodrine  5 mg Oral TID WC  . ondansetron (ZOFRAN) IV  4 mg Intravenous Once  . sodium chloride flush  3 mL Intravenous Q12H  . sodium chloride flush  3 mL Intravenous Q12H  . Warfarin - Pharmacist Dosing Inpatient   Does not apply [place]  Edmar Blankenburg  06/13/2020, 8:38 AM

## 2020-06-14 ENCOUNTER — Inpatient Hospital Stay (HOSPITAL_COMMUNITY): Payer: PPO

## 2020-06-14 ENCOUNTER — Encounter (HOSPITAL_COMMUNITY): Payer: Self-pay | Admitting: Internal Medicine

## 2020-06-14 DIAGNOSIS — I5021 Acute systolic (congestive) heart failure: Secondary | ICD-10-CM | POA: Diagnosis not present

## 2020-06-14 DIAGNOSIS — I472 Ventricular tachycardia: Secondary | ICD-10-CM | POA: Diagnosis not present

## 2020-06-14 LAB — CBC
HCT: 28.8 % — ABNORMAL LOW (ref 36.0–46.0)
Hemoglobin: 8.6 g/dL — ABNORMAL LOW (ref 12.0–15.0)
MCH: 29.2 pg (ref 26.0–34.0)
MCHC: 29.9 g/dL — ABNORMAL LOW (ref 30.0–36.0)
MCV: 97.6 fL (ref 80.0–100.0)
Platelets: 287 10*3/uL (ref 150–400)
RBC: 2.95 MIL/uL — ABNORMAL LOW (ref 3.87–5.11)
RDW: 14.8 % (ref 11.5–15.5)
WBC: 11.6 10*3/uL — ABNORMAL HIGH (ref 4.0–10.5)
nRBC: 2.9 % — ABNORMAL HIGH (ref 0.0–0.2)

## 2020-06-14 LAB — GLUCOSE, CAPILLARY
Glucose-Capillary: 186 mg/dL — ABNORMAL HIGH (ref 70–99)
Glucose-Capillary: 295 mg/dL — ABNORMAL HIGH (ref 70–99)
Glucose-Capillary: 237 mg/dL — ABNORMAL HIGH (ref 70–99)
Glucose-Capillary: 173 mg/dL — ABNORMAL HIGH (ref 70–99)

## 2020-06-14 LAB — BASIC METABOLIC PANEL
Anion gap: 13 (ref 5–15)
BUN: 79 mg/dL — ABNORMAL HIGH (ref 8–23)
CO2: 19 mmol/L — ABNORMAL LOW (ref 22–32)
Calcium: 8.3 mg/dL — ABNORMAL LOW (ref 8.9–10.3)
Chloride: 101 mmol/L (ref 98–111)
Creatinine, Ser: 2.72 mg/dL — ABNORMAL HIGH (ref 0.44–1.00)
GFR, Estimated: 17 mL/min — ABNORMAL LOW (ref >60)
Glucose, Bld: 255 mg/dL — ABNORMAL HIGH (ref 70–99)
Potassium: 4.3 mmol/L (ref 3.5–5.1)
Sodium: 133 mmol/L — ABNORMAL LOW (ref 135–145)

## 2020-06-14 LAB — PROTIME-INR
INR: 2.4 — ABNORMAL HIGH (ref 0.8–1.2)
Prothrombin Time: 25.6 seconds — ABNORMAL HIGH (ref 11.4–15.2)

## 2020-06-14 LAB — MAGNESIUM: Magnesium: 2.6 mg/dL — ABNORMAL HIGH (ref 1.7–2.4)

## 2020-06-14 MED ORDER — TORSEMIDE 20 MG PO TABS
20.0000 mg | ORAL_TABLET | Freq: Every day | ORAL | Status: DC
  Administered 2020-06-14 – 2020-06-16 (×3): 20 mg via ORAL
  Filled 2020-06-14 (×3): qty 1

## 2020-06-14 NOTE — Progress Notes (Addendum)
Patient ID: Cynthia Hansen, female   DOB: April 30, 1941, 80 y.o.   MRN: [id number] P    Advanced Heart Failure Rounding Note  PCP-Cardiologist: Larae Grooms, MD   Subjective:    - 06/05/20  patient had pulseless VT requiring shock x 1 and CPR x 4 minutes.  She had rapid recovery and was not intubated.  Electrolytes stable.  Amiodarone gtt started.  Due to low BP, she was started on norepinephrine 4 peripherally.  Creatinine rose 2.1 => 2.36 and HS-TnI rose to 7990.  Currently, only mild chest wall tenderness post-CPR.   - 06/08/19 RHC demonstrated mildly elevated filling pressures and relatively preserved CO. No LHC due to abnormal renal function. cMRI limited study. Very difficult images due to respiratory artifact and atrial fibrillation.  - 06/09/19 S/P Thoracentesis on R. 700 cc removed.    Remains in rate controlled Afib. No further VT. ICD placed yesterday. INR 2.4 today. Device pocket stable. CXR w/ proper lead placement. No pneumothorax.   Only complaint today is nausea. Not eating much. No vomiting.   Wt up 2 lb but ? Accurate (bed wt). No dyspnea, SCr down slightly, 2.8>>2.7.    Objective:   Weight Range: 79.8 kg Body mass index is 26.75 kg/m.   Vital Signs:   Temp:  [97.6 F (36.4 C)-98.3 F (36.8 C)] 97.7 F (36.5 C) (01/10 1108) Pulse Rate:  [76-99] 99 (01/10 1108) Resp:  [14-18] 17 (01/10 1108) BP: (94-106)/(53-71) 103/63 (01/10 1108) SpO2:  [95 %-98 %] 97 % (01/10 1108) Weight:  [79.8 kg] 79.8 kg (01/10 0500) Last BM Date: 06/12/20  Weight change: Filed Weights   06/11/20 0642 06/12/20 0637 06/14/20 0500  Weight: 79.1 kg 78.9 kg 79.8 kg    Intake/Output:   Intake/Output Summary (Last 24 hours) at 06/14/2020 1136 Last data filed at 06/13/2020 1746 Gross per 24 hour  Intake 120 ml  Output 0 ml  Net 120 ml      Physical Exam   PHYSICAL EXAM: General:  Well appearing. No respiratory difficulty HEENT: normal Neck: supple. no JVD. Carotids 2+ bilat; no  bruits. No lymphadenopathy or thyromegaly appreciated. Cor: PMI nondisplaced. Regular rate & rhythm. No rubs, gallops or murmurs. + left upper chest device pocket stable  Lungs: clear Abdomen: soft, nontender, nondistended. No hepatosplenomegaly. No bruits or masses. Good bowel sounds. Extremities: no cyanosis, clubbing, rash, edema Neuro: alert & oriented x 3, cranial nerves grossly intact. moves all 4 extremities w/o difficulty. Affect pleasant.   Telemetry    A Fib 90s. No further VT. Personally reviewed.    Labs    CBC Recent Labs    06/13/20 0105 06/14/20 0050  WBC 9.7 11.6*  HGB 8.7* 8.6*  HCT 26.9* 28.8*  MCV 93.1 97.6  PLT 298 665   Basic Metabolic Panel Recent Labs    06/13/20 0105 06/14/20 0050  NA 134* 133*  K 4.4 4.3  CL 101 101  CO2 20* 19*  GLUCOSE 155* 255*  BUN 77* 79*  CREATININE 2.85* 2.72*  CALCIUM 8.8* 8.3*  MG 2.8* 2.6*   Liver Function Tests No results for input(s): AST, ALT, ALKPHOS, BILITOT, PROT, ALBUMIN in the last 72 hours. No results for input(s): LIPASE, AMYLASE in the last 72 hours. Cardiac Enzymes No results for input(s): CKTOTAL, CKMB, CKMBINDEX, TROPONINI in the last 72 hours.  BNP: BNP (last 3 results) Recent Labs    05/27/20 0828 06/02/20 1832  BNP 2,213.1* 2,338.9*    ProBNP (last 3 results) Recent  Labs    05/13/20 1550 05/31/20 0939  PROBNP 41,962* 22,979*     D-Dimer No results for input(s): DDIMER in the last 72 hours. Hemoglobin A1C No results for input(s): HGBA1C in the last 72 hours. Fasting Lipid Panel No results for input(s): CHOL, HDL, LDLCALC, TRIG, CHOLHDL, LDLDIRECT in the last 72 hours. Thyroid Function Tests No results for input(s): TSH, T4TOTAL, T3FREE, THYROIDAB in the last 72 hours.  Invalid input(s): FREET3  Other results:   Imaging    DG Chest 2 View  Result Date: 06/14/2020 CLINICAL DATA:  Evaluate ICD placement. EXAM: CHEST - 2 VIEW COMPARISON:  06/08/2020 FINDINGS: Interval  placement of left ICD. ICD lead is in the right ventricle. No pneumothorax identified. Stable cardiac enlargement. Aortic atherosclerosis. No change in small bilateral pleural effusions. IMPRESSION: Interval placement of left ICD. No complications. Electronically Signed   By: Kerby Moors M.D.   On: 06/14/2020 07:22     Medications:     Scheduled Medications: . acetaminophen  1,000 mg Oral TID  . amiodarone  200 mg Oral BID  . aspirin  81 mg Oral Daily  . atorvastatin  80 mg Oral Daily  . colestipol  1 g Oral BID  . hydrocortisone cream   Topical QID  . insulin aspart  0-5 Units Subcutaneous QHS  . insulin aspart  0-9 Units Subcutaneous TID WC  . lidocaine  2 patch Transdermal Q24H  . melatonin  3 mg Oral QHS  . mesalamine  4.8 g Oral Q breakfast  . methimazole  5 mg Oral Daily  . midodrine  5 mg Oral TID WC  . ondansetron (ZOFRAN) IV  4 mg Intravenous Once  . sodium chloride flush  3 mL Intravenous Q12H  . sodium chloride flush  3 mL Intravenous Q12H  . Warfarin - Pharmacist Dosing Inpatient   Does not apply q1600    Infusions: . sodium chloride 10 mL/hr at 06/13/20 0348  . sodium chloride    . cefTRIAXone (ROCEPHIN)  IV 1 g (06/13/20 2203)  . ferumoxytol Stopped (06/10/20 1310)    PRN Medications: sodium chloride, acetaminophen, ondansetron (ZOFRAN) IV, polyethylene glycol, sodium chloride flush, traMADol   Assessment/Plan   1. Acute on chronic systolic CHF: Cardiolite in 11/21 with EF 26%, inferior fixed perfusion defect with no ischemia.  Echo in 12/21 with EF 35-40%, moderate LV dilation, mildly decreased RV function. Cause of cardiomyopathy uncertain but there is concern for ischemic cardiomyopathy. Cardiac MRI was difficult study, EF 25-30% estimated without definitive LGE. Worsening CHF recently complicated by CKD stage IV. She has had NYHA class III symptoms.  She diuresed well initially but had VT arrest.  RHC this admit w/ mildly elevated filling pressures, mild  pulmonary HTN and relatively preserved CO. Creatinine stable at 2.7,   volume status ok. SBP 90s-100s on midodrine.  - Nauseated w/ decreased PO intake, no diuretics today.   - Coreg on hold for hypotension and dapagliflozin stopped with AKI.  - Continue midodrine 5 mg tid  - Will be limited in terms of HF meds we can use with CKD 4, history of hyperkalemia, and soft BP.  For now, no ARNI/ARB/digoxin/spironolactone.   - Will be [place] candidate. - Ideally, would have coronary angiography to look for CAD as cause of cardiomyopathy and VT.  However, not candidate at this time in absence of ACS given AKI.  2. AKI on CKD Stage IV: Creatinine has plateaued. RHC showed relatively preserved cardiac output.  - Continue midodrine to  keep BP up.  - Nephrology following and conducting w/u. Appreciate their assistance.  - No diuretic today.  3. Chronic atrial fibrillation: This appears permanent, x years.  Has biatrial severe enlargement and unlikely to hold NSR with DCCV attempt. INR 2.4 today.  - resume warfarin when ok by EP 4. Low TSH: Concern for hyperthyroidism.  Free T4 is high, but free T3 is normal. Thyroid stimulating antibody negative  - Discussed abnormal TFTs with endocrinology, started on  low dose methimazole 5 mg qam. 5. CAD: NSTEMI with HS-TnI up to 7990 post-VT arrest and CPR.  As above, have been concerned for CAD as cause of cardiomyopathy.  ?Rise in troponin due to demand ischemia versus ACS => no ischemic pain (just chest wall tenderness post-CPR) and no ECG change (old ASMI as in past).    - Continue atorvastatin 80 mg daily.  - Continue ASA 81 daily.  - Ideally, would do coronary angiography but think we need improvement in creatinine for that in absence of ACS.  - cMRI w/ poor imaging, Very difficult delayed enhancement images. Possible subtle subendocardial LGE in the mid inferior wall seen on the short axis images, but this could not be confirmed in the long axis images. There  does not appear to be a large amount of scar. 6. Cardiac arrest: Pulseless VT.  Electrolytes stable. No chest pain or dyspnea prior.  ?Cause => suspect scar-mediated.  - Continue amiodarone 200 mg po bid.  - EP following. S/p ICD placement 1/9 7. Pulmonary: RLL PNA post-arrest by CXR.  Suspect aspiration. Afebrile.  - She has completed 7 days of ceftriaxone, will d/c today . 8. Pleural Effusion: S/P R Thoracentesis with 700 cc removed. Transudate from CHF.  9. Hyponatremia: Na mildly low 133 today   10. Anemia: Hgb 8.6.  ? Anemia of chronic disease from CKD. No overt GI bleeding but has Fe deficiency.  - FOBT negative.  - Received feraheme.  11. Neuropathic pain left arm:  Chronic.  Tylenol does not help, she has had side effects with gabapentin.  - Trial of tramadol.   12. Nausea - PNR Zofran   Continue to mobilize. Awaiting insurance approval for CIR.   Length of Stay: [address], PA-C  06/14/2020, 11:36 AM  Advanced Heart Failure Team Pager [phone] (M-F; 7a - 4p)  Please contact [facility] for night-coverage after hours (4p -7a ) and weekends on [email or web address]  Patient seen with PA, agree with the above note.   St Jude ICD placed yesterday.  Remains in rate-controlled atrial fibrillation, no VT.   Creatinine trending down, 2.7 today.   Nauseated this morning. This has been a chronic issue for her since she had COVID.  Feels better now.   General: NAD Neck: JVP 9-10 cm, no thyromegaly or thyroid nodule.  Lungs: Clear to auscultation bilaterally with normal respiratory effort. CV: Nondisplaced PMI.  Heart irregular S1/S2, no S3/S4, no murmur.  No peripheral edema.   Abdomen: Soft, nontender, no hepatosplenomegaly, no distention.  Skin: Intact without lesions or rashes.  Neurologic: Alert and oriented x 3.  Psych: Normal affect. Extremities: No clubbing or cyanosis.  HEENT: Normal.   Need to work with PT today, hopefully can get into CIR.   Creatinine now  trending down.  Mild volume overload.  Start torsemide 20 mg daily.  Follow creatinine closely.   Loralie Champagne 06/14/2020 12:51 PM

## 2020-06-14 NOTE — Progress Notes (Signed)
[id number] Offered to walk with pt but she declined at this time. Stated her arm was hurting. RN just medicated for pain. Discussed CRP 2 with pt who prefers [place]. Gave MI booklet. Pt stated she quit smoking in November and plans to stay quit. Pt will not be able to drive for some time due to ICD placement which may affect when she can attend CRP . Pt to try to walk with PT later. Graylon Good RN BSN 06/14/2020 9:11 AM

## 2020-06-14 NOTE — Progress Notes (Addendum)
Electrophysiology Rounding Note  Patient Name: Cynthia Hansen Date of Encounter: 06/14/2020  Primary Cardiologist: Larae Grooms, MD Electrophysiologist: Dr. Lovena Le   Subjective   S/p St Jude single chamber ICD 06/14/2019 by Dr. Lovena Le CXR this am stable appearing lead placement. Formal read pending.   The patient is doing well today. Mildly sore from procedure.   Inpatient Medications    Scheduled Meds:  acetaminophen  1,000 mg Oral TID   amiodarone  200 mg Oral BID   aspirin  81 mg Oral Daily   atorvastatin  80 mg Oral Daily   colestipol  1 g Oral BID   hydrocortisone cream   Topical QID   insulin aspart  0-5 Units Subcutaneous QHS   insulin aspart  0-9 Units Subcutaneous TID WC   lidocaine  2 patch Transdermal Q24H   melatonin  3 mg Oral QHS   mesalamine  4.8 g Oral Q breakfast   methimazole  5 mg Oral Daily   midodrine  5 mg Oral TID WC   ondansetron (ZOFRAN) IV  4 mg Intravenous Once   sodium chloride flush  3 mL Intravenous Q12H   sodium chloride flush  3 mL Intravenous Q12H   Warfarin - Pharmacist Dosing Inpatient   Does not apply q1600   Continuous Infusions:  sodium chloride 10 mL/hr at 06/13/20 0348   sodium chloride     cefTRIAXone (ROCEPHIN)  IV 1 g (06/13/20 2203)   ferumoxytol Stopped (06/10/20 1310)   PRN Meds: sodium chloride, acetaminophen, ondansetron (ZOFRAN) IV, polyethylene glycol, sodium chloride flush, traMADol   Vital Signs    Vitals:   06/13/20 1600 06/13/20 2040 06/13/20 2336 06/14/20 0500  BP: 101/71 (!) 104/53 101/64   Pulse: 81 76 79   Resp: 17 18 16    Temp: 98 F (36.7 C) 98.1 F (36.7 C) 98.3 F (36.8 C)   TempSrc: Oral Oral Oral   SpO2: 96% 95% 97%   Weight:    79.8 kg  Height:        Intake/Output Summary (Last 24 hours) at 06/14/2020 0722 Last data filed at 06/13/2020 1746 Gross per 24 hour  Intake 120 ml  Output 0 ml  Net 120 ml   Filed Weights   06/11/20 0642 06/12/20 0637 06/14/20 0500  Weight: 79.1 kg  78.9 kg 79.8 kg    Physical Exam    GEN- The patient is well appearing, alert and oriented x 3 today.   Head- normocephalic, atraumatic Eyes-  Sclera clear, conjunctiva pink Ears- hearing intact Oropharynx- clear Neck- supple Lungs- Clear to ausculation bilaterally, normal work of breathing Heart- Regular rate and rhythm, no murmurs, rubs or gallops GI- soft, NT, ND, + BS Extremities- no clubbing or cyanosis. No edema Skin- no rash or lesion Psych- euthymic mood, full affect Neuro- strength and sensation are intact  Labs    CBC Recent Labs    06/13/20 0105 06/14/20 0050  WBC 9.7 11.6*  HGB 8.7* 8.6*  HCT 26.9* 28.8*  MCV 93.1 97.6  PLT 298 427   Basic Metabolic Panel Recent Labs    06/13/20 0105 06/14/20 0050  NA 134* 133*  K 4.4 4.3  CL 101 101  CO2 20* 19*  GLUCOSE 155* 255*  BUN 77* 79*  CREATININE 2.85* 2.72*  CALCIUM 8.8* 8.3*  MG 2.8* 2.6*   Liver Function Tests No results for input(s): AST, ALT, ALKPHOS, BILITOT, PROT, ALBUMIN in the last 72 hours. No results for input(s): LIPASE, AMYLASE in the  last 72 hours. Cardiac Enzymes No results for input(s): CKTOTAL, CKMB, CKMBINDEX, TROPONINI in the last 72 hours.   Telemetry    [id number] (personally reviewed)  Radiology    EP PPM/ICD IMPLANT  Result Date: 06/13/2020  CONCLUSIONS:  1. Non-ischemic cardiomyopathy with chronic New York Heart Association class II heart failure.  2. Successful ICD implantation.  3. No early apparent complications. ICD Criteria Current LVEF:35%. Within 12 months prior to implant: Yes Heart failure history: Yes, Class II Cardiomyopathy history: Yes, Non-Ischemic Cardiomyopathy. Atrial Fibrillation/Atrial Flutter: Yes, Permanent. Ventricular tachycardia history: Yes, Hemodynamic instability present. VT Type: Sustained Ventricular Tachycardia - Monomorphic. Cardiac arrest history: Yes, Ventricular Tachycardia. History of syndromes with risk of sudden death: No. Previous ICD: No.  Current ICD indication: Secondary PPM indication: No.  Class I or II Bradycardia indication present: No Beta Blocker therapy for 3 or more months: Yes, prescribed. Ace Inhibitor/ARB therapy for 3 or more months: No, medical reason. Cynthia Peru, MD 11:24 AM 06/13/2020    Patient Profile     80 y.o. female admitted with VT, CHF, perm atrial fib for ICD  Assessment & Plan    1. VT -  S/p St Jude single chamber ICD 06/14/2019 CXR this am looks OK. Formal read pending.  Wound care and arm restrictions reviewed with patient. Usual follow up will be placed in AVS.  2. Atrial fib  - Rates controlled.  She is on coumadin for CHA2DS2VASC of at least 6. It has been held including last night.  INR 2.4 this am.     3. Chronic systolic heart failure  - She is on maximal meds. She cannot take an ACE/ARB due to renal failure 4. Stage 4 renal failure  Stable. Cr 2.72 this am.   For questions or updates, please contact [place] Please consult [email or web address] for contact info under Cardiology/STEMI.  Signed, Shirley Friar, PA-C  06/14/2020, 7:22 AM   EP Attending  Patient seen and examined. Agree with above. The patient is stable after ICD insertion yesterday. Interrogation of her device under my supervision demonstrates normal device function and her CXR looks good. She will be discharged home with usual followup.]  Cynthia Hansen

## 2020-06-14 NOTE — Discharge Instructions (Signed)
After Your ICD (Implantable Cardiac Defibrillator)   . You have a [employer] ICD  ACTIVITY . Do not lift your arm above shoulder height for 1 week after your procedure. After 7 days, you may progress as below.  . You should remove your sling 24 hours after your procedure, unless otherwise instructed by your provider.     2020/07/03  Tuesday June 22, 2020 Wednesday June 23, 2020 Thursday June 24, 2020   . Do not lift, push, pull, or carry anything over 10 pounds with the affected arm until 6 weeks (Monday July 26, 2020 ) after your procedure.   . Do NOT DRIVE until you have been seen for your wound check, or as long as instructed by your healthcare provider.   . Ask your healthcare provider when you can go back to work   INCISION/Dressing . If you are on a blood thinner such as Coumadin, Xarelto, Eliquis, Plavix, or Pradaxa please confirm with your provider when this should be resumed.   . Monitor your defibrillator site for redness, swelling, and drainage. Call the device clinic at [phone] if you experience these symptoms or fever/chills.  . If your incision is sealed with Steri-strips or staples, you may shower 10 days after your procedure or when told by your provider. Do not remove the steri-strips or let the shower hit directly on your site. You may wash around your site with soap and water.    Marland Kitchen Avoid lotions, ointments, or perfumes over your incision until it is well-healed.  . You may use a hot tub or a pool AFTER your wound check appointment if the incision is completely closed.  . Your ICD is designed to protect you from life threatening heart rhythms. Because of this, you may receive a shock.   o 1 shock with no symptoms:  Call the office during business hours. o 1 shock with symptoms (chest pain, chest pressure, dizziness, lightheadedness, shortness of breath, overall feeling unwell):  Call 911. o If you experience 2 or more shocks in 24  hours:  Call 911. o If you receive a shock, you should not drive for 6 months per the [place] DMV IF you receive appropriate therapy from your ICD.   . ICD Alerts:  Some alerts are vibratory and others beep. These are NOT emergencies. Please call our office to let us know. If this occurs at night or on weekends, it can wait until the next business day. Send a remote transmission.  . If your device is capable of reading fluid status (for heart failure), you will be offered monthly monitoring to review this with you.   DEVICE MANAGEMENT . Remote monitoring is used to monitor your ICD from home. This monitoring is scheduled every 91 days by our office. It allows [place] to keep an eye on the functioning of your device to ensure it is working properly. You will routinely see your Electrophysiologist annually (more often if necessary).   . You should receive your ID card for your new device in 4-8 weeks. Keep this card with you at all times once received. Consider wearing a medical alert bracelet or necklace.  . Your ICD  may be MRI compatible. This will be discussed at your next office visit/wound check.  You should avoid contact with strong electric or magnetic fields.    Do not use amateur (ham) radio equipment or electric (arc) welding torches. MP3 player headphones with magnets should not be used. Some devices are  safe to use if held at least 12 inches (30 cm) from your defibrillator. These include power tools, lawn mowers, and speakers. If you are unsure if something is safe to use, ask your health care provider.   When using your cell phone, hold it to the ear that is on the opposite side from the defibrillator. Do not leave your cell phone in a pocket over the defibrillator.   You may safely use electric blankets, heating pads, computers, and microwave ovens.  Call the office right away if:  You have chest pain.  You feel more than one shock.  You feel more short of breath than you have felt  before.  You feel more light-headed than you have felt before.  Your incision starts to open up.  This information is not intended to replace advice given to you by your health care provider. Make sure you discuss any questions you have with your health care provider.

## 2020-06-14 NOTE — Plan of Care (Signed)

## 2020-06-14 NOTE — Progress Notes (Signed)
  [facility] Progress Note   Assessment/ Plan:   1. AKI on CKD Stage IV - Baseline appears 1.8-2.3. AKI 2/2 ATN w/ arrest but also related to CHF and low Bps leading to decrease filtration. This is likely why Crt has plateaued. Stable/slightly improved to 2.7 today - Volume status stable today; could consider restarting lasix if needed (PO) - Continue midodrine 5 mg TID - Continue holding Farxiga and ACEI/ARB 2. Acute systolic CHF w/ cardiac arrest: TTE 12/21 with EF 35% - 40% - AHF following - s/p ICD 1/9 3. Iron Deficiency anemia: CKD may be contributing. S/p feraheme x1 and redose next week. 4. Hyponatremia: Na much improved at 134 5. Nutrition:Low sodium diet   Subjective:   Cynthia Hansen is a 80 y.o. with PMH of Afib, DM II, HTN, h/o CEA and new dx of dilated cardiomyopathy with EF 30-35% who is seen in consultation for AKI on CKD. She is on hospital day 12.  No acute events, s/p ICD placement yesterday. Other than left shoulder soreness, no complaints.   Objective:   BP (!) 94/58 (BP Location: Right Arm)   Pulse 89   Temp 97.6 F (36.4 C) (Oral)   Resp 15   Ht 5\' 8"  (1.727 m)   Wt 79.8 kg   SpO2 98%   BMI 26.75 kg/m   Physical Exam: VZD:GLOVF in bed, NAD CVS: normal rate, no murmur  Resp: Normal work of breathing , bilateral chest rise Abd: Soft, NT Ext: No edema, warm, left arm in sling   Labs: BMET Recent Labs  Lab 06/09/20 1321 06/10/20 0044 06/10/20 0902 06/11/20 0051 06/12/20 0059 06/13/20 0105 06/14/20 0050  NA 133* 126* 134* 134* 135 134* 133*  K 3.9 3.8 4.2 4.1 4.2 4.4 4.3  CL 100 95* 99 101 102 101 101  CO2 20* 19* 20* 20* 21* 20* 19*  GLUCOSE 206* 173* 155* 157* 113* 155* 255*  BUN 70* 74* 74* 76* 75* 77* 79*  CREATININE 2.87* 2.94* 2.84* 2.85* 2.88* 2.85* 2.72*  CALCIUM 8.6* 8.0* 8.8* 8.8* 8.6* 8.8* 8.3*  PHOS  --   --  5.3*  --   --   --   --    CBC Recent Labs  Lab 06/11/20 0051 06/12/20 0059 06/13/20 0105  06/14/20 0050  WBC 7.0 8.3 9.7 11.6*  HGB 8.6* 8.7* 8.7* 8.6*  HCT 26.1* 27.0* 26.9* 28.8*  MCV 92.2 94.4 93.1 97.6  PLT 242 265 298 287      Medications:    . acetaminophen  1,000 mg Oral TID  . amiodarone  200 mg Oral BID  . aspirin  81 mg Oral Daily  . atorvastatin  80 mg Oral Daily  . colestipol  1 g Oral BID  . hydrocortisone cream   Topical QID  . insulin aspart  0-5 Units Subcutaneous QHS  . insulin aspart  0-9 Units Subcutaneous TID WC  . lidocaine  2 patch Transdermal Q24H  . melatonin  3 mg Oral QHS  . mesalamine  4.8 g Oral Q breakfast  . methimazole  5 mg Oral Daily  . midodrine  5 mg Oral TID WC  . ondansetron (ZOFRAN) IV  4 mg Intravenous Once  . sodium chloride flush  3 mL Intravenous Q12H  . sodium chloride flush  3 mL Intravenous Q12H  . Warfarin - Pharmacist Dosing Inpatient   Does not apply [id number]     RJJOA CZYSA  06/14/2020, 10:01 AM

## 2020-06-14 NOTE — Progress Notes (Addendum)
Inpatient Rehabilitation Admissions Coordinator    I will open case with [employer] for possible CIR admit pending insurance approval and bed availability this week. I met with patient and her daughter at bedside to encourage increased activity today.   , RN, MSN Rehab Admissions Coordinator [phone] 06/14/2020 12:24 PM  

## 2020-06-14 NOTE — Progress Notes (Signed)
Physical Therapy Treatment Patient Details Name: Cynthia Hansen MRN: [id number] DOB: 26-Feb-1941 Today's Date: 06/14/2020    History of Present Illness 80yo female admitted with acute systolic CHF. PMH A-fib, acute on chronic heart failure, DCM wtih 30-35% ejection fraction, DM, HLD, 06/05/20 Cardiac arrest with 4 min CPR, Rt lung thorocentesis 06/08/2020, s/p ICD placement 06/13/20.    PT Comments    Patient reports feeling better than this morning. Requires more assist with bed mobility today as pt s/p ICD placement yesterday. Education on pacer precautions. Requires Mod A to stand from low BSC but Min guard assist from elevated EOB. Tolerated gait training with Min A and use of RW for support; HR up to 120s bpm with an episode of vtach which resolved. Pt also with episode of nausea/vomiting after straining to get up from toilet and trying to have a BM. Pt/daughter report pt has had on/off nausea for the last few days with no appetite, attributing this to addition of new medication?  Pt demonstrates deficits in strength, balance, endurance and overall safe mobility. Highly motivated to get to CIR. Will follow.   Follow Up Recommendations  CIR     Equipment Recommendations  3in1 (PT);Rolling walker with 5" wheels    Recommendations for Other Services       Precautions / Restrictions Precautions Precautions: Fall;Other (comment) Precaution Comments: watch HR, chronic BLE numbness Restrictions Weight Bearing Restrictions: No    Mobility  Bed Mobility Overal bed mobility: Needs Assistance Bed Mobility: Supine to Sit     Supine to sit: HOB elevated;Min assist Sit to supine: Mod assist   General bed mobility comments: Assist to roll onto right side with cues to adhere from using LUE due to recent ICD placement. Min A to elevate trunk. Able to scoot to EOB. Assist to bring LEs into bed to return to supine.  Transfers Overall transfer level: Needs assistance Equipment used: Rolling  walker (2 wheeled) Transfers: Sit to/from Stand Sit to Stand: Mod assist;Min guard;From elevated surface         General transfer comment: mod A to power to standing from [facility] with cues for anterior translation; Min guard assist to stand from elevated bed height x2 with cues for technique. Cues to hold head up as pt wth tendency to let head fall forward.  Ambulation/Gait Ambulation/Gait assistance: Min assist Gait Distance (Feet): 70 Feet (+ 15' x2) Assistive device: Rolling walker (2 wheeled) Gait Pattern/deviations: Step-through pattern;Drifts right/left;Narrow base of support Gait velocity: decreased   General Gait Details: Slow, mildly unsteady gait with narrow bOS and drifting noted; difficulty with turns needing Min A for support. HR up to 120s with an episode of vtach, which resolved.   Stairs             Wheelchair Mobility    Modified Rankin (Stroke Patients Only)       Balance Overall balance assessment: Needs assistance Sitting-balance support: Feet supported;Single extremity supported Sitting balance-Leahy Scale: Fair Sitting balance - Comments: Close Min guard for safety as pt closing eyes and lowering head onto rail or forward affecting balance.   Standing balance support: During functional activity Standing balance-Leahy Scale: Poor Standing balance comment: Requires UE support in standing.                            Cognition Arousal/Alertness: Awake/alert Behavior During Therapy: WFL for tasks assessed/performed Overall Cognitive Status: Within Functional Limits for tasks assessed  General Comments: very pleasant and talkative but very verbose; needs repetition at times likely due to [facility].      Exercises      General Comments General comments (skin integrity, edema, etc.): daughter present during session. Pt with episode of nausea and vomiting after straining to get up from toilet that  lasted a few minutes. Reports this has been going onand off for th last few days; pt with no appetite.      Pertinent Vitals/Pain Pain Assessment: Faces Faces Pain Scale: Hurts even more Pain Location: neck and LUE Pain Descriptors / Indicators: Aching;Sore Pain Intervention(s): Monitored during session;Repositioned    Home Living                      Prior Function            PT Goals (current goals can now be found in the care plan section) Progress towards PT goals: Progressing toward goals (slowly)    Frequency    Min 3X/week      PT Plan Current plan remains appropriate    Co-evaluation              AM-PAC PT "6 Clicks" Mobility   Outcome Measure  Help needed turning from your back to your side while in a flat bed without using bedrails?: None Help needed moving from lying on your back to sitting on the side of a flat bed without using bedrails?: A Little Help needed moving to and from a bed to a chair (including a wheelchair)?: A Lot Help needed standing up from a chair using your arms (e.g., wheelchair or bedside chair)?: A Lot Help needed to walk in hospital room?: A Little Help needed climbing 3-5 steps with a railing? : A Lot 6 Click Score: 16    End of Session Equipment Utilized During Treatment: Gait belt Activity Tolerance: Other (comment) (limited by nausea/vomiting) Patient left: in bed;with call bell/phone within reach;with bed alarm set;with family/visitor present Nurse Communication: Mobility status PT Visit Diagnosis: Unsteadiness on feet (R26.81);Muscle weakness (generalized) (M62.81);Difficulty in walking, not elsewhere classified (R26.2)     Time: 6144-3154 PT Time Calculation (min) (ACUTE ONLY): 47 min  Charges:  $Gait Training: 8-22 mins $Therapeutic Activity: 23-37 mins                     Marisa Severin, PT, DPT Acute Rehabilitation Services Pager [phone] Office [place] 06/14/2020, 3:43 PM

## 2020-06-15 DIAGNOSIS — I5021 Acute systolic (congestive) heart failure: Secondary | ICD-10-CM | POA: Diagnosis not present

## 2020-06-15 LAB — CBC
HCT: 28.7 % — ABNORMAL LOW (ref 36.0–46.0)
Hemoglobin: 8.6 g/dL — ABNORMAL LOW (ref 12.0–15.0)
MCH: 29.2 pg (ref 26.0–34.0)
MCHC: 30 g/dL (ref 30.0–36.0)
MCV: 97.3 fL (ref 80.0–100.0)
Platelets: 312 10*3/uL (ref 150–400)
RBC: 2.95 MIL/uL — ABNORMAL LOW (ref 3.87–5.11)
RDW: 15.4 % (ref 11.5–15.5)
WBC: 11.7 10*3/uL — ABNORMAL HIGH (ref 4.0–10.5)
nRBC: 1.8 % — ABNORMAL HIGH (ref 0.0–0.2)

## 2020-06-15 LAB — BASIC METABOLIC PANEL
Anion gap: 12 (ref 5–15)
BUN: 82 mg/dL — ABNORMAL HIGH (ref 8–23)
CO2: 23 mmol/L (ref 22–32)
Calcium: 8.6 mg/dL — ABNORMAL LOW (ref 8.9–10.3)
Chloride: 98 mmol/L (ref 98–111)
Creatinine, Ser: 2.73 mg/dL — ABNORMAL HIGH (ref 0.44–1.00)
GFR, Estimated: 17 mL/min — ABNORMAL LOW (ref >60)
Glucose, Bld: 215 mg/dL — ABNORMAL HIGH (ref 70–99)
Potassium: 4.4 mmol/L (ref 3.5–5.1)
Sodium: 133 mmol/L — ABNORMAL LOW (ref 135–145)

## 2020-06-15 LAB — GLUCOSE, CAPILLARY
Glucose-Capillary: 159 mg/dL — ABNORMAL HIGH (ref 70–99)
Glucose-Capillary: 232 mg/dL — ABNORMAL HIGH (ref 70–99)
Glucose-Capillary: 208 mg/dL — ABNORMAL HIGH (ref 70–99)
Glucose-Capillary: 201 mg/dL — ABNORMAL HIGH (ref 70–99)

## 2020-06-15 LAB — PROTIME-INR
INR: 1.9 — ABNORMAL HIGH (ref 0.8–1.2)
Prothrombin Time: 21.3 seconds — ABNORMAL HIGH (ref 11.4–15.2)

## 2020-06-15 LAB — MAGNESIUM: Magnesium: 2.7 mg/dL — ABNORMAL HIGH (ref 1.7–2.4)

## 2020-06-15 MED ORDER — WARFARIN SODIUM 1 MG PO TABS
1.0000 mg | ORAL_TABLET | Freq: Once | ORAL | Status: AC
  Administered 2020-06-15: 1 mg via ORAL
  Filled 2020-06-15: qty 1

## 2020-06-15 MED ORDER — WARFARIN - PHARMACIST DOSING INPATIENT: Freq: Every day | Status: DC

## 2020-06-15 NOTE — Progress Notes (Signed)
  [facility] Progress Note   Assessment/ Plan:   1. AKI on CKD Stage IV - Baseline appears 1.8-2.3. AKI 2/2 ATN w/ arrest but also related to CHF and relative hypotension leading to decrease filtration. This is likely why Crt has plateaued. Cr stable 2.7, suspecting this is going to be around her new baseline - Continue midodrine 5 mg TID - Continue holding Farxiga and ACEI/ARB 2. Acute systolic CHF w/ cardiac arrest: TTE 12/21 with EF 35% - 40% - AHF following - s/p ICD 1/9 -on torsemide  3. Iron Deficiency anemia: CKD may be contributing. S/p feraheme x1 and redose next week. 4. Hyponatremia: stable 5. Nutrition:Low sodium diet   Dispo: awaiting CIR  Subjective:   Cynthia Hansen is a 80 y.o. with PMH of Afib, DM II, HTN, h/o CEA and new dx of dilated cardiomyopathy with EF 30-35% who is seen in consultation for AKI on CKD. She is on hospital day 13.  No acute events, no complaints. Eager to go to rehab   Objective:   BP (!) 105/53 (BP Location: Left Arm)   Pulse 70   Temp 97.7 F (36.5 C) (Oral)   Resp 15   Ht 5\' 8"  (1.727 m)   Wt 80.8 kg   SpO2 97%   BMI 27.08 kg/m   Physical Exam: NIO:EVOJJ in bed, NAD CVS: normal rate, no murmur  Resp: Normal work of breathing , bilateral chest rise Abd: Soft, NT Ext: No edema, warm, left arm in sling   Labs: BMET Recent Labs  Lab 06/10/20 0044 06/10/20 0902 06/11/20 0051 06/12/20 0059 06/13/20 0105 06/14/20 0050 06/15/20 0036  NA 126* 134* 134* 135 134* 133* 133*  K 3.8 4.2 4.1 4.2 4.4 4.3 4.4  CL 95* 99 101 102 101 101 98  CO2 19* 20* 20* 21* 20* 19* 23  GLUCOSE 173* 155* 157* 113* 155* 255* 215*  BUN 74* 74* 76* 75* 77* 79* 82*  CREATININE 2.94* 2.84* 2.85* 2.88* 2.85* 2.72* 2.73*  CALCIUM 8.0* 8.8* 8.8* 8.6* 8.8* 8.3* 8.6*  PHOS  --  5.3*  --   --   --   --   --    CBC Recent Labs  Lab 06/12/20 0059 06/13/20 0105 06/14/20 0050 06/15/20 0036  WBC 8.3 9.7 11.6* 11.7*  HGB 8.7* 8.7* 8.6*  8.6*  HCT 27.0* 26.9* 28.8* 28.7*  MCV 94.4 93.1 97.6 97.3  PLT 265 298 287 312      Medications:    . acetaminophen  1,000 mg Oral TID  . amiodarone  200 mg Oral BID  . aspirin  81 mg Oral Daily  . atorvastatin  80 mg Oral Daily  . colestipol  1 g Oral BID  . hydrocortisone cream   Topical QID  . insulin aspart  0-5 Units Subcutaneous QHS  . insulin aspart  0-9 Units Subcutaneous TID WC  . lidocaine  2 patch Transdermal Q24H  . melatonin  3 mg Oral QHS  . mesalamine  4.8 g Oral Q breakfast  . methimazole  5 mg Oral Daily  . midodrine  5 mg Oral TID WC  . ondansetron (ZOFRAN) IV  4 mg Intravenous Once  . sodium chloride flush  3 mL Intravenous Q12H  . sodium chloride flush  3 mL Intravenous Q12H  . torsemide  20 mg Oral Daily     Erla Bacchi  06/15/2020, 11:42 AM

## 2020-06-15 NOTE — Progress Notes (Signed)
Inpatient Rehabilitation Admissions Coordinator  I have insurance approval for Cir admit but no bed available today. I await bed availability over the next 24 to 48 hrs. I met with patient at bedside and she is aware. I have alerted acute team and TOC.   , RN, MSN Rehab Admissions Coordinator [phone] 06/15/2020 1:47 PM  

## 2020-06-15 NOTE — Progress Notes (Signed)
Occupational Therapy Treatment Patient Details Name: Cynthia Hansen MRN: [id number] DOB: 09-18-1940 Today's Date: 06/15/2020    History of present illness 80yo female admitted with acute systolic CHF. PMH A-fib, acute on chronic heart failure, DCM wtih 30-35% ejection fraction, DM, HLD, 06/05/20 Cardiac arrest with 4 min CPR, Rt lung thorocentesis 06/08/2020, s/p ICD placement 06/13/20.   OT comments  Pt making steady progress towards OT goals this session. Pt received supine in bed agreeable to OT intervention. Pt continues to present with decreased activity tolerance, generalized weakness ( BLE>BUE), and increased pain impacting pts ability to complete BADLs independently. Pt able to ambulate ~ 36 ft with RW and MIN A. Pt HR fluctuating from 110-120 bpm during mobility needing one standing rest break after ~ 59ft. Pt currently requires MIN A for UB ADLs and MAX A for LB ADLs. Education and handout provided on ICD precautions with pt verbalizing understanding. Pt with reports of nausea after ambulation with episode of dry heaving with HR increasing to 126 bpm needing supine rest break to recover. Pt continues to be an excellent CIR candidate as pt very motivated to return to PLOF and has excellent family support from her daughter. Will continue to follow acutely per POC.    Follow Up Recommendations  CIR    Equipment Recommendations  None recommended by OT    Recommendations for Other Services      Precautions / Restrictions Precautions Precautions: Fall;Other (comment);ICD/Pacemaker Precaution Comments: watch HR, chronic BLE numbness, ICD precautions ( placement 1/9) Restrictions Weight Bearing Restrictions: No       Mobility Bed Mobility Overal bed mobility: Needs Assistance Bed Mobility: Supine to Sit;Sit to Supine     Supine to sit: HOB elevated;Min assist Sit to supine: Min assist   General bed mobility comments: exiting bed to pts R side so pt could use RUE to elevate trunk  into sitting, light MIN A to elevate trunk ( pt reports she exits bed to L side at home), cues for technique in order to maintain ICD precautions. MIN A to return to supine needs assist to elevate BLEs d/t fatigue  Transfers Overall transfer level: Needs assistance Equipment used: Rolling walker (2 wheeled) Transfers: Sit to/from Stand Sit to Stand: Min assist         General transfer comment: MIN A to power into standing and for inital steadying assist as pt reports mild dizziness upon initial stand    Balance Overall balance assessment: Needs assistance Sitting-balance support: Feet supported;Single extremity supported Sitting balance-Leahy Scale: Fair Sitting balance - Comments: pt able to statically sit EOB with minguard   Standing balance support: During functional activity Standing balance-Leahy Scale: Poor Standing balance comment: Requires UE support in standing.                           ADL either performed or assessed with clinical judgement   ADL Overall ADL's : Needs assistance/impaired                 Upper Body Dressing : Minimal assistance;Sitting Upper Body Dressing Details (indicate cue type and reason): to don gown as back side cover Lower Body Dressing: Maximal assistance;Sitting/lateral leans Lower Body Dressing Details (indicate cue type and reason): to adjust socks from EOB Toilet Transfer: Minimal assistance;RW;Ambulation Toilet Transfer Details (indicate cue type and reason): simulated via functional mobility ~ 36 Ft with Rw and MIN A         Functional  mobility during ADLs: Minimal assistance;Rolling walker General ADL Comments: pt continues to present with decreased activity tolerance, generalized weakness and tachycardia with minimal exertion     Vision       Perception     Praxis      Cognition Arousal/Alertness: Awake/alert Behavior During Therapy: WFL for tasks assessed/performed Overall Cognitive Status: Within  Functional Limits for tasks assessed                                 General Comments: very motivated and asking appropriate questions about rehab        Exercises     Shoulder Instructions       General Comments recurrent episoded pod dry heaving during session pt HR increasing to as much as 126 bpm, BP EOB prior to mobilizing: 116/65,BP supine after walking: 111/56. pt on RA during session with sats >90. pt reports being very active prior to hospitalization frequently going to gym and working out with weights independently, pt reports liking to [employer]. pt reports she has a stair lift at home and her daughter can stay with her after hopeful CIR stay    Pertinent Vitals/ Pain       Pain Assessment: Faces Faces Pain Scale: Hurts little more Pain Location: neck, L shin Pain Descriptors / Indicators: Aching;Sore;Discomfort;Grimacing Pain Intervention(s): Limited activity within patient's tolerance;Monitored during session;Repositioned  Home Living                                          Prior Functioning/Environment              Frequency  Min 2X/week        Progress Toward Goals  OT Goals(current goals can now be found in the care plan section)  Progress towards OT goals: Progressing toward goals  Acute Rehab OT Goals Patient Stated Goal: feel better, get stronger OT Goal Formulation: With patient Time For Goal Achievement: 06/23/20 Potential to Achieve Goals: Good  Plan Discharge plan remains appropriate;Frequency remains appropriate    Co-evaluation                 AM-PAC OT "6 Clicks" Daily Activity     Outcome Measure   Help from another person eating meals?: None Help from another person taking care of personal grooming?: A Little Help from another person toileting, which includes using toliet, bedpan, or urinal?: A Little Help from another person bathing (including washing, rinsing, drying)?: A Little Help  from another person to put on and taking off regular upper body clothing?: A Little Help from another person to put on and taking off regular lower body clothing?: A Lot 6 Click Score: 18    End of Session Equipment Utilized During Treatment: Rolling walker;Gait belt  OT Visit Diagnosis: Unsteadiness on feet (R26.81);Muscle weakness (generalized) (M62.81);Dizziness and giddiness (R42);Pain   Activity Tolerance Patient tolerated treatment well   Patient Left in bed;with call bell/phone within reach;with bed alarm set   Nurse Communication Mobility status;Other (comment) (tachy, episode of nausea)        Time: 9798-9211 OT Time Calculation (min): 52 min  Charges: OT General Charges $OT Visit: 1 Visit OT Treatments $Self Care/Home Management : 38-52 mins Harley Alto., COTA/L Acute Rehabilitation Services [phone]   Precious Haws 06/15/2020, 1:45 PM

## 2020-06-15 NOTE — Progress Notes (Signed)
CARDIAC REHAB PHASE I   PRE:  Rate/Rhythm: 67 afib  BP:  Supine: 105/63  Sitting:   Standing:    SaO2: 98%RA  MODE:  Ambulation: 160 ft   POST:  Rate/Rhythm: 118-123 afib  BP:  Supine: 106/78  Sitting:   Standing:    SaO2: 98%RA 1400-1445 Came to see pt at 1300 and she was eating. Made appointment for in an hour. Pt walked 160 ft on RA with gait belt use, rollator and asst x 1. Did not need to sit. To BSC when back to room. Pt needs assistance when standing. Tolerated activity well. Back to bed with call bell and bed alarm.   Graylon Good, RN BSN  06/15/2020 2:39 PM

## 2020-06-15 NOTE — Progress Notes (Signed)
Patient ID: Cynthia Hansen, female   DOB: 10/23/1940, 80 y.o.   MRN: [id number] P    Advanced Heart Failure Rounding Note  PCP-Cardiologist: Larae Grooms, MD   Subjective:    - 06/05/20  patient had pulseless VT requiring shock x 1 and CPR x 4 minutes.  She had rapid recovery and was not intubated.  Electrolytes stable.  Amiodarone gtt started.  Due to low BP, she was started on norepinephrine 4 peripherally.  Creatinine rose 2.1 => 2.36 and HS-TnI rose to 7990.  Currently, only mild chest wall tenderness post-CPR.   - 06/08/19 RHC demonstrated mildly elevated filling pressures and relatively preserved CO. No LHC due to abnormal renal function. cMRI limited study. Very difficult images due to respiratory artifact and atrial fibrillation.  - 06/09/19 S/P Thoracentesis on R. 700 cc removed.  - 1/9 St Jude ICD placed.   Remains in rate controlled Afib. No further VT.  INR 1.9 today. Device pocket stable.   No nausea this morning.  Started on torsemide 20 daily yesterday, creatinine stable 2.73.    Objective:   Weight Range: 80.8 kg Body mass index is 27.08 kg/m.   Vital Signs:   Temp:  [97.7 F (36.5 C)-98.1 F (36.7 C)] 97.7 F (36.5 C) (01/10 2311) Pulse Rate:  [53-99] 70 (01/11 0545) Resp:  [15-19] 15 (01/10 2311) BP: (90-105)/(53-63) 105/53 (01/11 0545) SpO2:  [97 %-99 %] 97 % (01/11 0545) Weight:  [80.8 kg] 80.8 kg (01/11 0545) Last BM Date: 06/12/20  Weight change: Filed Weights   06/12/20 0637 06/14/20 0500 06/15/20 0545  Weight: 78.9 kg 79.8 kg 80.8 kg    Intake/Output:  No intake or output data in the 24 hours ending 06/15/20 0957    Physical Exam   General: NAD Neck: JVP 8 cm, no thyromegaly or thyroid nodule.  Lungs: Clear to auscultation bilaterally with normal respiratory effort. CV: Nondisplaced PMI.  Heart irregular S1/S2, no S3/S4, no murmur.  Trace ankle edema.   Abdomen: Soft, nontender, no hepatosplenomegaly, no distention.  Skin: Intact without  lesions or rashes.  Neurologic: Alert and oriented x 3.  Psych: Normal affect. Extremities: No clubbing or cyanosis.  HEENT: Normal.    Telemetry    A Fib 80s. No further VT. Personally reviewed.    Labs    CBC Recent Labs    06/14/20 0050 06/15/20 0036  WBC 11.6* 11.7*  HGB 8.6* 8.6*  HCT 28.8* 28.7*  MCV 97.6 97.3  PLT 287 258   Basic Metabolic Panel Recent Labs    06/14/20 0050 06/15/20 0036  NA 133* 133*  K 4.3 4.4  CL 101 98  CO2 19* 23  GLUCOSE 255* 215*  BUN 79* 82*  CREATININE 2.72* 2.73*  CALCIUM 8.3* 8.6*  MG 2.6* 2.7*   Liver Function Tests No results for input(s): AST, ALT, ALKPHOS, BILITOT, PROT, ALBUMIN in the last 72 hours. No results for input(s): LIPASE, AMYLASE in the last 72 hours. Cardiac Enzymes No results for input(s): CKTOTAL, CKMB, CKMBINDEX, TROPONINI in the last 72 hours.  BNP: BNP (last 3 results) Recent Labs    05/27/20 0828 06/02/20 1832  BNP 2,213.1* 2,338.9*    ProBNP (last 3 results) Recent Labs    05/13/20 1550 05/31/20 0939  PROBNP 46,357* 48,555*     D-Dimer No results for input(s): DDIMER in the last 72 hours. Hemoglobin A1C No results for input(s): HGBA1C in the last 72 hours. Fasting Lipid Panel No results for input(s): CHOL, HDL, LDLCALC,  TRIG, CHOLHDL, LDLDIRECT in the last 72 hours. Thyroid Function Tests No results for input(s): TSH, T4TOTAL, T3FREE, THYROIDAB in the last 72 hours.  Invalid input(s): FREET3  Other results:   Imaging    No results found.   Medications:     Scheduled Medications: . acetaminophen  1,000 mg Oral TID  . amiodarone  200 mg Oral BID  . aspirin  81 mg Oral Daily  . atorvastatin  80 mg Oral Daily  . colestipol  1 g Oral BID  . hydrocortisone cream   Topical QID  . insulin aspart  0-5 Units Subcutaneous QHS  . insulin aspart  0-9 Units Subcutaneous TID WC  . lidocaine  2 patch Transdermal Q24H  . melatonin  3 mg Oral QHS  . mesalamine  4.8 g Oral Q  breakfast  . methimazole  5 mg Oral Daily  . midodrine  5 mg Oral TID WC  . ondansetron (ZOFRAN) IV  4 mg Intravenous Once  . sodium chloride flush  3 mL Intravenous Q12H  . sodium chloride flush  3 mL Intravenous Q12H  . torsemide  20 mg Oral Daily    Infusions: . sodium chloride 10 mL/hr at 06/13/20 0348  . sodium chloride    . ferumoxytol Stopped (06/10/20 1310)    PRN Medications: sodium chloride, acetaminophen, ondansetron (ZOFRAN) IV, polyethylene glycol, sodium chloride flush, traMADol   Assessment/Plan   1. Acute on chronic systolic CHF: Cardiolite in 11/21 with EF 26%, inferior fixed perfusion defect with no ischemia.  Echo in 12/21 with EF 35-40%, moderate LV dilation, mildly decreased RV function. Cause of cardiomyopathy uncertain but there is concern for ischemic cardiomyopathy. Cardiac MRI was difficult study, EF 25-30% estimated without definitive LGE. Worsening CHF recently complicated by CKD stage IV. She has had NYHA class III symptoms.  She diuresed well initially but had VT arrest.  RHC this admit w/ mildly elevated filling pressures, mild pulmonary HTN and relatively preserved CO. Creatinine stable at 2.73, suspect mild volume overload. SBP 90s-100s on midodrine.  - Restarted torsemide 20 mg daily yesterday, no I/Os.  Need to follow I/Os today to assess response, may need to increase.  - Coreg on hold for hypotension and dapagliflozin stopped with AKI.  - Continue midodrine 5 mg tid  - Will be limited in terms of HF meds we can use with CKD 4, history of hyperkalemia, and soft BP. For now, no ARNI/ARB/digoxin/spironolactone.   - Will be [place] candidate. - Ideally, would have coronary angiography to look for CAD as cause of cardiomyopathy and VT.  However, not candidate at this time in absence of ACS given AKI.  2. AKI on CKD Stage IV: Creatinine has plateaued. RHC showed relatively preserved cardiac output.  - Continue midodrine to keep BP up.  - Nephrology  following. Appreciate their assistance.  3. Chronic atrial fibrillation: This appears permanent, x years.  Has biatrial severe enlargement and unlikely to hold NSR with DCCV attempt. INR 1.9 today.  - Continue warfarin.  4. Low TSH: Concern for hyperthyroidism.  Free T4 is high, but free T3 is normal. Thyroid stimulating antibody negative  - Discussed abnormal TFTs with endocrinology, started on  low dose methimazole 5 mg qam followup with endocrinology as outpatient. 5. CAD: NSTEMI with HS-TnI up to 7990 post-VT arrest and CPR.  As above, have been concerned for CAD as cause of cardiomyopathy.  ?Rise in troponin due to demand ischemia versus ACS => no ischemic pain (just chest wall tenderness post-CPR)  and no ECG change (old ASMI as in past).    - Continue atorvastatin 80 mg daily.  - Continue ASA 81 daily => stop when INR therapeutic.  - Ideally, would do coronary angiography but think we need improvement in creatinine for that in absence of ACS.  - cMRI w/ poor imaging, Very difficult delayed enhancement images. Possible subtle subendocardial LGE in the mid inferior wall seen on the short axis images, but this could not be confirmed in the long axis images. There does not appear to be a large amount of scar. 6. Cardiac arrest: Pulseless VT.  Electrolytes stable. No chest pain or dyspnea prior.  ?Cause => suspect scar-mediated.  - Continue amiodarone 200 mg po bid.  - EP following. S/p ICD placement 1/9 7. Pulmonary: RLL PNA post-arrest by CXR.  Suspect aspiration. Afebrile.  - Completed course of ceftriaxone.  8. Pleural Effusion: S/P R Thoracentesis with 700 cc removed. Transudate from CHF.  9. Hyponatremia: Na mildly low 133 today   10. Anemia: Hgb 8.6.  ? Anemia of chronic disease from CKD. No overt GI bleeding but has Fe deficiency.  - FOBT negative.  - Received feraheme.  11. Neuropathic pain left arm:  Chronic.  Tylenol does not help, she has had side effects with gabapentin.  - Trial  of tramadol.   12. Nausea - Prn Zofran   Continue to mobilize. Awaiting insurance approval for CIR, ready for CIR when approved.   Length of Stay: 23  Loralie Champagne, MD  06/15/2020, 9:57 AM  Advanced Heart Failure Team Pager [phone] (M-F; 7a - 4p)  Please contact [facility] for night-coverage after hours (4p -7a ) and weekends on [email or web address]

## 2020-06-15 NOTE — Progress Notes (Signed)
Cynthia Hansen for  warfarin Indication: atrial fibrillation  Allergies  Allergen Reactions  . Avandia [Rosiglitazone] Other (See Comments)    Causes muscle weakness.  . Latex Rash  . Sulfa Antibiotics Rash    Patient Measurements: Height: 5\' 8"  (172.7 cm) Weight: 80.8 kg (178 lb 2.1 oz) IBW/kg (Calculated) : 63.9 Heparin Dosing Weight: 79.3 kg  Vital Signs: Temp: 97.8 F (36.6 C) (01/11 1211) BP: 116/76 (01/11 1211) Pulse Rate: 79 (01/11 1211)  Labs: Recent Labs    06/13/20 0105 06/14/20 0050 06/15/20 0036  HGB 8.7* 8.6* 8.6*  HCT 26.9* 28.8* 28.7*  PLT 298 287 312  LABPROT 26.5* 25.6* 21.3*  INR 2.5* 2.4* 1.9*  CREATININE 2.85* 2.72* 2.73*    Estimated Creatinine Clearance: 19 mL/min (A) (by C-G formula based on SCr of 2.73 mg/dL (H)).   Medical History: Past Medical History:  Diagnosis Date  . A-fib ([place])   . Acute on chronic combined systolic (congestive) and diastolic (congestive) heart failure ([place])   . Carotid artery occlusion   . DCM (dilated cardiomyopathy) ([place])    EF 30-35% by echo 05/2020  . Diabetes mellitus   . Hyperlipidemia   . Irregular heart beat   . Vitamin D deficiency      Assessment: [name] on warfarin for afib PTA now holding warfarin for [facility]. Pharmacy consulted to start heparin when INR <2. Admit INR 2.8> down to 1.8.  And heparin started > heparin stopped post cath   Warfarin resumed post cath 1/3 and received 2 doses. INR went up to 3.8 so warfarin has been held since 1/5, INR now down to 1.9.   She is now s/p ICD placement this 1/9. Hgb low stable 8.6, plts 312. No overt bleeding noted. Pharmacy asked to resume Coumadin tonight.  Warfarin PTA dose: 1.5mg  Fridays, 1mg  all other days (Last seen 05/26/20 by [facility]    Goal of Therapy:  INR 2-3 Monitor platelets by anticoagulation protocol: Yes   Plan:  -Coumadin 1 mg x 1 tonight - will dose with caution, appears sensitivity increased with  amiodarone. -Daily INR, CBC  [place] Crane, Vena [place], BCPS, BCCP Clinical Pharmacist  06/15/2020 1:31 PM   [facility] pharmacy phone numbers are listed on [email or web address]

## 2020-06-16 ENCOUNTER — Encounter (HOSPITAL_COMMUNITY): Payer: Self-pay | Admitting: Physical Medicine & Rehabilitation

## 2020-06-16 ENCOUNTER — Inpatient Hospital Stay (HOSPITAL_COMMUNITY)
Admission: RE | Admit: 2020-06-16 | Discharge: 2020-07-06 | DRG: 945 | Disposition: E | Payer: PPO | Source: Intra-hospital | Attending: Physical Medicine & Rehabilitation | Admitting: Physical Medicine & Rehabilitation

## 2020-06-16 ENCOUNTER — Other Ambulatory Visit: Payer: Self-pay

## 2020-06-16 DIAGNOSIS — J90 Pleural effusion, not elsewhere classified: Secondary | ICD-10-CM

## 2020-06-16 DIAGNOSIS — E1165 Type 2 diabetes mellitus with hyperglycemia: Secondary | ICD-10-CM | POA: Diagnosis not present

## 2020-06-16 DIAGNOSIS — Z87891 Personal history of nicotine dependence: Secondary | ICD-10-CM

## 2020-06-16 DIAGNOSIS — R7309 Other abnormal glucose: Secondary | ICD-10-CM | POA: Diagnosis not present

## 2020-06-16 DIAGNOSIS — R5381 Other malaise: Principal | ICD-10-CM

## 2020-06-16 DIAGNOSIS — Z6827 Body mass index (BMI) 27.0-27.9, adult: Secondary | ICD-10-CM

## 2020-06-16 DIAGNOSIS — N170 Acute kidney failure with tubular necrosis: Secondary | ICD-10-CM | POA: Diagnosis present

## 2020-06-16 DIAGNOSIS — E871 Hypo-osmolality and hyponatremia: Secondary | ICD-10-CM

## 2020-06-16 DIAGNOSIS — Z7984 Long term (current) use of oral hypoglycemic drugs: Secondary | ICD-10-CM | POA: Diagnosis not present

## 2020-06-16 DIAGNOSIS — Z515 Encounter for palliative care: Secondary | ICD-10-CM

## 2020-06-16 DIAGNOSIS — I272 Pulmonary hypertension, unspecified: Secondary | ICD-10-CM | POA: Diagnosis present

## 2020-06-16 DIAGNOSIS — Z7901 Long term (current) use of anticoagulants: Secondary | ICD-10-CM | POA: Diagnosis not present

## 2020-06-16 DIAGNOSIS — K5900 Constipation, unspecified: Secondary | ICD-10-CM | POA: Diagnosis not present

## 2020-06-16 DIAGNOSIS — D631 Anemia in chronic kidney disease: Secondary | ICD-10-CM | POA: Diagnosis not present

## 2020-06-16 DIAGNOSIS — I482 Chronic atrial fibrillation, unspecified: Secondary | ICD-10-CM | POA: Diagnosis not present

## 2020-06-16 DIAGNOSIS — Z95810 Presence of automatic (implantable) cardiac defibrillator: Secondary | ICD-10-CM

## 2020-06-16 DIAGNOSIS — E1122 Type 2 diabetes mellitus with diabetic chronic kidney disease: Secondary | ICD-10-CM | POA: Diagnosis not present

## 2020-06-16 DIAGNOSIS — D509 Iron deficiency anemia, unspecified: Secondary | ICD-10-CM | POA: Diagnosis present

## 2020-06-16 DIAGNOSIS — E1142 Type 2 diabetes mellitus with diabetic polyneuropathy: Secondary | ICD-10-CM | POA: Diagnosis present

## 2020-06-16 DIAGNOSIS — I5043 Acute on chronic combined systolic (congestive) and diastolic (congestive) heart failure: Secondary | ICD-10-CM | POA: Diagnosis not present

## 2020-06-16 DIAGNOSIS — I214 Non-ST elevation (NSTEMI) myocardial infarction: Secondary | ICD-10-CM | POA: Diagnosis not present

## 2020-06-16 DIAGNOSIS — I420 Dilated cardiomyopathy: Secondary | ICD-10-CM | POA: Diagnosis present

## 2020-06-16 DIAGNOSIS — I5023 Acute on chronic systolic (congestive) heart failure: Secondary | ICD-10-CM

## 2020-06-16 DIAGNOSIS — I5021 Acute systolic (congestive) heart failure: Secondary | ICD-10-CM | POA: Diagnosis not present

## 2020-06-16 DIAGNOSIS — E46 Unspecified protein-calorie malnutrition: Secondary | ICD-10-CM

## 2020-06-16 DIAGNOSIS — E8809 Other disorders of plasma-protein metabolism, not elsewhere classified: Secondary | ICD-10-CM

## 2020-06-16 DIAGNOSIS — K5901 Slow transit constipation: Secondary | ICD-10-CM | POA: Diagnosis not present

## 2020-06-16 DIAGNOSIS — R339 Retention of urine, unspecified: Secondary | ICD-10-CM | POA: Diagnosis not present

## 2020-06-16 DIAGNOSIS — Z79899 Other long term (current) drug therapy: Secondary | ICD-10-CM

## 2020-06-16 DIAGNOSIS — R0602 Shortness of breath: Secondary | ICD-10-CM | POA: Diagnosis not present

## 2020-06-16 DIAGNOSIS — I2510 Atherosclerotic heart disease of native coronary artery without angina pectoris: Secondary | ICD-10-CM | POA: Diagnosis present

## 2020-06-16 DIAGNOSIS — E785 Hyperlipidemia, unspecified: Secondary | ICD-10-CM | POA: Diagnosis not present

## 2020-06-16 DIAGNOSIS — I130 Hypertensive heart and chronic kidney disease with heart failure and stage 1 through stage 4 chronic kidney disease, or unspecified chronic kidney disease: Secondary | ICD-10-CM | POA: Diagnosis not present

## 2020-06-16 DIAGNOSIS — K5190 Ulcerative colitis, unspecified, without complications: Secondary | ICD-10-CM | POA: Diagnosis not present

## 2020-06-16 DIAGNOSIS — I4891 Unspecified atrial fibrillation: Secondary | ICD-10-CM | POA: Diagnosis not present

## 2020-06-16 DIAGNOSIS — N184 Chronic kidney disease, stage 4 (severe): Secondary | ICD-10-CM | POA: Diagnosis not present

## 2020-06-16 DIAGNOSIS — I462 Cardiac arrest due to underlying cardiac condition: Secondary | ICD-10-CM | POA: Diagnosis not present

## 2020-06-16 DIAGNOSIS — D638 Anemia in other chronic diseases classified elsewhere: Secondary | ICD-10-CM

## 2020-06-16 DIAGNOSIS — R112 Nausea with vomiting, unspecified: Secondary | ICD-10-CM | POA: Diagnosis not present

## 2020-06-16 DIAGNOSIS — R1110 Vomiting, unspecified: Secondary | ICD-10-CM

## 2020-06-16 DIAGNOSIS — I472 Ventricular tachycardia: Secondary | ICD-10-CM | POA: Diagnosis not present

## 2020-06-16 DIAGNOSIS — N179 Acute kidney failure, unspecified: Secondary | ICD-10-CM | POA: Diagnosis not present

## 2020-06-16 LAB — GLUCOSE, CAPILLARY
Glucose-Capillary: 183 mg/dL — ABNORMAL HIGH (ref 70–99)
Glucose-Capillary: 179 mg/dL — ABNORMAL HIGH (ref 70–99)
Glucose-Capillary: 156 mg/dL — ABNORMAL HIGH (ref 70–99)

## 2020-06-16 LAB — BASIC METABOLIC PANEL
Anion gap: 13 (ref 5–15)
BUN: 89 mg/dL — ABNORMAL HIGH (ref 8–23)
CO2: 21 mmol/L — ABNORMAL LOW (ref 22–32)
Calcium: 8.4 mg/dL — ABNORMAL LOW (ref 8.9–10.3)
Chloride: 98 mmol/L (ref 98–111)
Creatinine, Ser: 2.88 mg/dL — ABNORMAL HIGH (ref 0.44–1.00)
GFR, Estimated: 16 mL/min — ABNORMAL LOW (ref >60)
Glucose, Bld: 232 mg/dL — ABNORMAL HIGH (ref 70–99)
Potassium: 4.4 mmol/L (ref 3.5–5.1)
Sodium: 132 mmol/L — ABNORMAL LOW (ref 135–145)

## 2020-06-16 LAB — PROTIME-INR
INR: 1.9 — ABNORMAL HIGH (ref 0.8–1.2)
Prothrombin Time: 20.9 seconds — ABNORMAL HIGH (ref 11.4–15.2)

## 2020-06-16 LAB — MAGNESIUM: Magnesium: 2.6 mg/dL — ABNORMAL HIGH (ref 1.7–2.4)

## 2020-06-16 MED ORDER — MIDODRINE HCL 10 MG PO TABS: 10.0000 mg | ORAL_TABLET | Freq: Three times a day (TID) | ORAL | Status: AC

## 2020-06-16 MED ORDER — ASPIRIN 81 MG PO CHEW: 81.0000 mg | CHEWABLE_TABLET | Freq: Every day | ORAL | Status: AC

## 2020-06-16 MED ORDER — FLEET ENEMA 7-19 GM/118ML RE ENEM: 1.0000 | ENEMA | Freq: Once | RECTAL | Status: DC | PRN

## 2020-06-16 MED ORDER — POLYETHYLENE GLYCOL 3350 17 G PO PACK: 17.0000 g | PACK | Freq: Every day | ORAL | 0 refills | Status: AC | PRN

## 2020-06-16 MED ORDER — WARFARIN SODIUM 1 MG PO TABS: 1.0000 mg | ORAL_TABLET | Freq: Once | ORAL | 0 refills | Status: AC

## 2020-06-16 MED ORDER — MELATONIN 3 MG PO TABS
3.0000 mg | ORAL_TABLET | Freq: Every day | ORAL | Status: DC
  Administered 2020-06-16 – 2020-06-18 (×3): 3 mg via ORAL
  Filled 2020-06-16 (×3): qty 1

## 2020-06-16 MED ORDER — ACETAMINOPHEN 325 MG PO TABS
650.0000 mg | ORAL_TABLET | Freq: Three times a day (TID) | ORAL | Status: DC
  Administered 2020-06-16 – 2020-06-20 (×10): 650 mg via ORAL
  Filled 2020-06-16 (×13): qty 2

## 2020-06-16 MED ORDER — ENSURE MAX PROTEIN PO LIQD
11.0000 [oz_av] | Freq: Two times a day (BID) | ORAL | Status: DC
  Administered 2020-06-18 – 2020-06-20 (×6): 11 [oz_av] via ORAL

## 2020-06-16 MED ORDER — TRAMADOL HCL 50 MG PO TABS: 50.0000 mg | ORAL_TABLET | Freq: Two times a day (BID) | ORAL | Status: AC | PRN

## 2020-06-16 MED ORDER — TORSEMIDE 20 MG PO TABS
40.0000 mg | ORAL_TABLET | Freq: Every day | ORAL | Status: DC
  Administered 2020-06-17: 40 mg via ORAL
  Filled 2020-06-16: qty 2

## 2020-06-16 MED ORDER — COLESTIPOL HCL 1 G PO TABS
1.0000 g | ORAL_TABLET | Freq: Two times a day (BID) | ORAL | Status: DC
  Administered 2020-06-16 – 2020-06-20 (×9): 1 g via ORAL
  Filled 2020-06-16 (×10): qty 1

## 2020-06-16 MED ORDER — MELATONIN 3 MG PO TABS: 3.0000 mg | ORAL_TABLET | Freq: Every day | ORAL | 0 refills | Status: AC

## 2020-06-16 MED ORDER — METHIMAZOLE 5 MG PO TABS: 5.0000 mg | ORAL_TABLET | Freq: Every day | ORAL | Status: AC

## 2020-06-16 MED ORDER — FERUMOXYTOL INJECTION 510 MG/17 ML
510.0000 mg | INTRAVENOUS | Status: DC
  Filled 2020-06-16 (×2): qty 17

## 2020-06-16 MED ORDER — TRAZODONE HCL 50 MG PO TABS
25.0000 mg | ORAL_TABLET | Freq: Every evening | ORAL | Status: DC | PRN
  Administered 2020-06-20: 50 mg via ORAL
  Filled 2020-06-16: qty 1

## 2020-06-16 MED ORDER — ATORVASTATIN CALCIUM 80 MG PO TABS: 80.0000 mg | ORAL_TABLET | Freq: Every day | ORAL | Status: AC

## 2020-06-16 MED ORDER — POLYETHYLENE GLYCOL 3350 17 G PO PACK
17.0000 g | PACK | Freq: Every day | ORAL | Status: DC
  Administered 2020-06-17 – 2020-06-18 (×2): 17 g via ORAL
  Filled 2020-06-16 (×2): qty 1

## 2020-06-16 MED ORDER — INSULIN ASPART 100 UNIT/ML ~~LOC~~ SOLN: 0.0000 [IU] | Freq: Every day | SUBCUTANEOUS | Status: DC

## 2020-06-16 MED ORDER — PROCHLORPERAZINE 25 MG RE SUPP: 12.5000 mg | Freq: Four times a day (QID) | RECTAL | Status: DC | PRN

## 2020-06-16 MED ORDER — MIDODRINE HCL 5 MG PO TABS
10.0000 mg | ORAL_TABLET | Freq: Three times a day (TID) | ORAL | Status: DC
  Administered 2020-06-16: 10 mg via ORAL
  Filled 2020-06-16: qty 2

## 2020-06-16 MED ORDER — HYDROCORTISONE 1 % EX CREA
TOPICAL_CREAM | Freq: Four times a day (QID) | CUTANEOUS | Status: DC
  Filled 2020-06-16: qty 28

## 2020-06-16 MED ORDER — TRAMADOL HCL 50 MG PO TABS
50.0000 mg | ORAL_TABLET | Freq: Two times a day (BID) | ORAL | Status: DC | PRN
  Administered 2020-06-17 – 2020-06-21 (×2): 50 mg via ORAL
  Filled 2020-06-16 (×2): qty 1

## 2020-06-16 MED ORDER — PROCHLORPERAZINE EDISYLATE 10 MG/2ML IJ SOLN: 5.0000 mg | Freq: Four times a day (QID) | INTRAMUSCULAR | Status: DC | PRN

## 2020-06-16 MED ORDER — POLYETHYLENE GLYCOL 3350 17 G PO PACK
17.0000 g | PACK | Freq: Every day | ORAL | Status: DC | PRN
Start: 2020-06-16 — End: 2020-06-21

## 2020-06-16 MED ORDER — INSULIN ASPART 100 UNIT/ML ~~LOC~~ SOLN
0.0000 [IU] | Freq: Three times a day (TID) | SUBCUTANEOUS | Status: DC
  Administered 2020-06-17 (×2): 5 [IU] via SUBCUTANEOUS
  Administered 2020-06-17: 2 [IU] via SUBCUTANEOUS
  Administered 2020-06-18: 5 [IU] via SUBCUTANEOUS
  Administered 2020-06-18: 3 [IU] via SUBCUTANEOUS
  Administered 2020-06-18: 1 [IU] via SUBCUTANEOUS
  Administered 2020-06-19: 2 [IU] via SUBCUTANEOUS
  Administered 2020-06-19: 3 [IU] via SUBCUTANEOUS
  Administered 2020-06-19 – 2020-06-20 (×3): 2 [IU] via SUBCUTANEOUS
  Administered 2020-06-20: 3 [IU] via SUBCUTANEOUS

## 2020-06-16 MED ORDER — MIDODRINE HCL 5 MG PO TABS
10.0000 mg | ORAL_TABLET | Freq: Three times a day (TID) | ORAL | Status: DC
  Administered 2020-06-17 – 2020-06-20 (×12): 10 mg via ORAL
  Filled 2020-06-16 (×12): qty 2

## 2020-06-16 MED ORDER — AMIODARONE HCL 200 MG PO TABS
200.0000 mg | ORAL_TABLET | Freq: Two times a day (BID) | ORAL | Status: DC
  Administered 2020-06-16 – 2020-06-18 (×4): 200 mg via ORAL
  Filled 2020-06-16 (×4): qty 1

## 2020-06-16 MED ORDER — MESALAMINE 1.2 G PO TBEC
4.8000 g | DELAYED_RELEASE_TABLET | Freq: Every day | ORAL | Status: DC
  Filled 2020-06-16 (×5): qty 4

## 2020-06-16 MED ORDER — ACETAMINOPHEN 325 MG PO TABS
325.0000 mg | ORAL_TABLET | ORAL | Status: DC | PRN
  Filled 2020-06-16: qty 2

## 2020-06-16 MED ORDER — ATORVASTATIN CALCIUM 80 MG PO TABS
80.0000 mg | ORAL_TABLET | Freq: Every day | ORAL | Status: DC
  Administered 2020-06-17 – 2020-06-20 (×4): 80 mg via ORAL
  Filled 2020-06-16 (×4): qty 1

## 2020-06-16 MED ORDER — TORSEMIDE 20 MG PO TABS
20.0000 mg | ORAL_TABLET | Freq: Once | ORAL | Status: AC
  Administered 2020-06-16: 20 mg via ORAL
  Filled 2020-06-16: qty 1

## 2020-06-16 MED ORDER — DIPHENHYDRAMINE HCL 12.5 MG/5ML PO ELIX: 12.5000 mg | ORAL_SOLUTION | Freq: Four times a day (QID) | ORAL | Status: DC | PRN

## 2020-06-16 MED ORDER — AMIODARONE HCL 200 MG PO TABS: 200.0000 mg | ORAL_TABLET | Freq: Two times a day (BID) | ORAL | Status: AC

## 2020-06-16 MED ORDER — METHIMAZOLE 5 MG PO TABS
5.0000 mg | ORAL_TABLET | Freq: Every day | ORAL | Status: DC
  Administered 2020-06-17 – 2020-06-20 (×4): 5 mg via ORAL
  Filled 2020-06-16 (×5): qty 1

## 2020-06-16 MED ORDER — WARFARIN - PHARMACIST DOSING INPATIENT: Freq: Every day | Status: DC

## 2020-06-16 MED ORDER — INSULIN ASPART 100 UNIT/ML ~~LOC~~ SOLN: 0.0000 [IU] | Freq: Three times a day (TID) | SUBCUTANEOUS | 11 refills | Status: AC

## 2020-06-16 MED ORDER — ONDANSETRON HCL 4 MG/2ML IJ SOLN: 4.0000 mg | Freq: Four times a day (QID) | INTRAMUSCULAR | Status: DC | PRN

## 2020-06-16 MED ORDER — MESALAMINE 1.2 G PO TBEC: 4.8000 g | DELAYED_RELEASE_TABLET | Freq: Every day | ORAL | Status: AC

## 2020-06-16 MED ORDER — WARFARIN SODIUM 1 MG PO TABS
1.0000 mg | ORAL_TABLET | Freq: Once | ORAL | Status: AC
  Administered 2020-06-16: 1 mg via ORAL
  Filled 2020-06-16: qty 1

## 2020-06-16 MED ORDER — PROCHLORPERAZINE MALEATE 5 MG PO TABS: 5.0000 mg | ORAL_TABLET | Freq: Four times a day (QID) | ORAL | Status: DC | PRN

## 2020-06-16 MED ORDER — PROCHLORPERAZINE MALEATE 5 MG PO TABS
5.0000 mg | ORAL_TABLET | Freq: Four times a day (QID) | ORAL | Status: DC | PRN
  Administered 2020-06-16: 5 mg via ORAL
  Administered 2020-06-17 – 2020-06-20 (×5): 10 mg via ORAL
  Filled 2020-06-16 (×8): qty 2

## 2020-06-16 MED ORDER — TORSEMIDE 20 MG PO TABS: 40.0000 mg | ORAL_TABLET | Freq: Every day | ORAL | Status: DC

## 2020-06-16 MED ORDER — BISACODYL 10 MG RE SUPP
10.0000 mg | Freq: Every day | RECTAL | Status: DC | PRN
  Administered 2020-06-18 – 2020-06-19 (×2): 10 mg via RECTAL
  Filled 2020-06-16 (×2): qty 1

## 2020-06-16 MED ORDER — SODIUM CHLORIDE 0.9 % IV SOLN
510.0000 mg | INTRAVENOUS | Status: AC
Start: 2020-06-17 — End: ?

## 2020-06-16 MED ORDER — TORSEMIDE 20 MG PO TABS: 40.0000 mg | ORAL_TABLET | Freq: Every day | ORAL | Status: AC

## 2020-06-16 MED ORDER — ASPIRIN 81 MG PO CHEW
81.0000 mg | CHEWABLE_TABLET | Freq: Every day | ORAL | Status: DC
  Administered 2020-06-17: 81 mg via ORAL
  Filled 2020-06-16: qty 1

## 2020-06-16 MED ORDER — GUAIFENESIN-DM 100-10 MG/5ML PO SYRP: 5.0000 mL | ORAL_SOLUTION | Freq: Four times a day (QID) | ORAL | Status: DC | PRN

## 2020-06-16 MED ORDER — INSULIN ASPART 100 UNIT/ML ~~LOC~~ SOLN: 0.0000 [IU] | Freq: Every day | SUBCUTANEOUS | 11 refills | Status: AC

## 2020-06-16 MED ORDER — ALUM & MAG HYDROXIDE-SIMETH 200-200-20 MG/5ML PO SUSP: 30.0000 mL | ORAL | Status: DC | PRN

## 2020-06-16 MED ORDER — LIDOCAINE 5 % EX PTCH
2.0000 | MEDICATED_PATCH | CUTANEOUS | Status: DC
  Administered 2020-06-17 – 2020-06-20 (×4): 2 via TRANSDERMAL
  Filled 2020-06-16 (×4): qty 2

## 2020-06-16 MED ORDER — WARFARIN SODIUM 1 MG PO TABS
1.0000 mg | ORAL_TABLET | Freq: Once | ORAL | Status: DC
  Filled 2020-06-16: qty 1

## 2020-06-16 NOTE — Progress Notes (Signed)
[place] for  warfarin Indication: atrial fibrillation  Allergies  Allergen Reactions  . Avandia [Rosiglitazone] Other (See Comments)    Causes muscle weakness.  . Latex Rash  . Sulfa Antibiotics Rash    Patient Measurements: Height: 5\' 8"  (172.7 cm) Weight: 82.4 kg (181 lb 10.5 oz) IBW/kg (Calculated) : 63.9 Heparin Dosing Weight: 79.3 kg  Vital Signs: Temp: 98 F (36.7 C) (01/12 1545) Temp Source: Oral (01/12 1545) BP: 102/61 (01/12 1545) Pulse Rate: 73 (01/12 1545)  Labs: Recent Labs    06/14/20 0050 06/15/20 0036 06/09/2020 0039  HGB 8.6* 8.6*  --   HCT 28.8* 28.7*  --   PLT 287 312  --   LABPROT 25.6* 21.3* 20.9*  INR 2.4* 1.9* 1.9*  CREATININE 2.72* 2.73* 2.88*    Estimated Creatinine Clearance: 18.1 mL/min (A) (by C-G formula based on SCr of 2.88 mg/dL (H)).   Medical History: Past Medical History:  Diagnosis Date  . A-fib ([place])   . Acute on chronic combined systolic (congestive) and diastolic (congestive) heart failure ([place])   . Carotid artery occlusion   . DCM (dilated cardiomyopathy) ([place])    EF 30-35% by echo 05/2020  . Diabetes mellitus   . Hyperlipidemia   . Irregular heart beat   . Vitamin D deficiency      Assessment: [name] on warfarin for afib PTA now holding warfarin for [facility]. Pharmacy consulted to start heparin when INR <2. Admit INR 2.8> down to 1.8.  Heparin started > heparin stopped post cath   Warfarin resumed post cath 1/3 and received 2 doses. INR went up to 3.8 so warfarin has been held since 1/5, for ICD placement 1/9.  Warfarin resumed 1/11.  Hgb low stable 8.6, plts 312 on last check. No overt bleeding noted.  Warfarin PTA dose: 1.5mg  Fridays, 1mg  all other days (Last seen 05/26/20 by [facility]).    Goal of Therapy:  INR 2-3 Monitor platelets by anticoagulation protocol: Yes   Plan:  Never received dose earlier tonight given timing of transition   -Will order Coumadin 1 mg x 1 again  tonight - will dose with caution, appears sensitivity increased with amiodarone. -Daily INR, CBC  Antonietta Jewel, PharmD, [place] Pharmacist  Phone: [phone] 06/05/2020 6:02 PM  Please check AMION for all [place] phone numbers After 10:00 PM, call [place] [phone]

## 2020-06-16 NOTE — Progress Notes (Signed)
[place] for  warfarin Indication: atrial fibrillation  Allergies  Allergen Reactions  . Avandia [Rosiglitazone] Other (See Comments)    Causes muscle weakness.  . Latex Rash  . Sulfa Antibiotics Rash    Patient Measurements: Height: 5\' 8"  (172.7 cm) Weight: 81.1 kg (178 lb 12.7 oz) IBW/kg (Calculated) : 63.9 Heparin Dosing Weight: 79.3 kg  Vital Signs: Temp: 97.5 F (36.4 C) (01/12 1045) Temp Source: Oral (01/12 1045) BP: 98/59 (01/12 1045) Pulse Rate: 80 (01/12 1045)  Labs: Recent Labs    06/14/20 0050 06/15/20 0036 06/08/2020 0039  HGB 8.6* 8.6*  --   HCT 28.8* 28.7*  --   PLT 287 312  --   LABPROT 25.6* 21.3* 20.9*  INR 2.4* 1.9* 1.9*  CREATININE 2.72* 2.73* 2.88*    Estimated Creatinine Clearance: 18 mL/min (A) (by C-G formula based on SCr of 2.88 mg/dL (H)).   Medical History: Past Medical History:  Diagnosis Date  . A-fib ([place])   . Acute on chronic combined systolic (congestive) and diastolic (congestive) heart failure ([place])   . Carotid artery occlusion   . DCM (dilated cardiomyopathy) ([place])    EF 30-35% by echo 05/2020  . Diabetes mellitus   . Hyperlipidemia   . Irregular heart beat   . Vitamin D deficiency      Assessment: [name] on warfarin for afib PTA now holding warfarin for [facility]. Pharmacy consulted to start heparin when INR <2. Admit INR 2.8> down to 1.8.  Heparin started > heparin stopped post cath   Warfarin resumed post cath 1/3 and received 2 doses. INR went up to 3.8 so warfarin has been held since 1/5, for ICD placement 1/9.  Warfarin resumed 1/11.  Hgb low stable 8.6, plts 312. No overt bleeding noted.  Warfarin PTA dose: 1.5mg  Fridays, 1mg  all other days (Last seen 05/26/20 by [facility]).    Goal of Therapy:  INR 2-3 Monitor platelets by anticoagulation protocol: Yes   Plan:  -Coumadin 1 mg x 1 again tonight - will dose with caution, appears sensitivity increased with amiodarone. -Daily INR,  CBC  [place] Crane, [place]ena [place], BCPS, BCCP Clinical Pharmacist  06/24/2020 1:30 PM   [facility] pharmacy phone numbers are listed on [email or web address]

## 2020-06-16 NOTE — H&P (Addendum)
Physical Medicine and Rehabilitation Admission H&P    CC: Debility.   HPI: Cynthia Hansen. Cynthia Hansen is a 80 year old female with history of A fib- on coumadin, T2DM, ulcerative colitis (Dr. Watt Climes),  DDD- neck/back (Dr. Nelva Bush), tobacco use, recent diagnosis of systolic CHF with difficulty managing on outpatient basis.  History taken from chart review and daughter.  She was admitted on 06/02/2020 with shortness of breath, fluid overload with marked LLE edema and acute on chronic renal failure with hyperkalemia.  She was started on IV lasix with plans for cardiac cath to work up cardiomyopathy with ejection fraction of 35-40%.  Hospital course significant for code blue on 06/05/2020 due to pulseless ventricular tachycardia, requiring CPR/shock with ROSC after 4 minutes as well as question of aspiration event. She was started on ceftriaxone for treatment and underwent right thoracocentesis of clear yellow 700 cc on 06/08/2020.  Nephrology was consulted for input regarding acute on chronic renal failure with hyponateremia and felt that AKI likely multifactorial, due to Frarxiga/hypotension/VT. Dr. Hollie Salk recommended increasing midodrine for BP support and renal ultrasound done revealing moderate right renal parenchyma atrophy and atropic/poorly visualized left kidney. Iron deficiency anemia supplemented with ferriheme X 1.  Dr. Lovena Le consulted for input on VT and recommended ICD which was placed on 06/13/2020 once stable medically. Nephrology consulted due to  ICD placed on 01/03. SCr has stabilized to 2.7 range and she was cleared to resume diuretics as weight on upward trend. Therapy ongoing and patient continues to be limited by weakness and hypotension, poor posture, as well as DOE/tachycardia with activity. CIR recommended due to functional decline.  Please agree admission assessment from earlier today as well.  Per daughter: patient has had DOE--used to get SOB walking to mail box for a couple of years, however  this has improved since PTA. "Socially" active but sedentary (used to sit a lot).   Review of Systems  Constitutional: Positive for malaise/fatigue. Negative for chills and fever.  HENT: Negative for hearing loss and tinnitus.   Eyes: Negative for blurred vision and double vision.  Respiratory: Positive for shortness of breath. Negative for cough.   Cardiovascular: Positive for chest pain (chest wall pain).  Gastrointestinal: Positive for constipation (only two since admission), heartburn, nausea and vomiting.       Wears mini pads due to bowel urgency/inconctinence   Genitourinary: Negative for dysuria and urgency.  Musculoskeletal: Positive for back pain and neck pain. Negative for myalgias.  Skin: Negative for rash.  Neurological: Positive for dizziness, sensory change (numbness from knees down and left arm from shoulder to wrist post Covid booster. ) and weakness. Negative for headaches.  Psychiatric/Behavioral: The patient is not nervous/anxious and does not have insomnia.   All other systems reviewed and are negative.    Past Medical History:  Diagnosis Date  . A-fib ([place])   . Acute on chronic combined systolic (congestive) and diastolic (congestive) heart failure ([place])   . Carotid artery occlusion   . DCM (dilated cardiomyopathy) ([place])    EF 30-35% by echo 05/2020  . Diabetes mellitus   . Hyperlipidemia   . Irregular heart beat   . Vitamin D deficiency     Past Surgical History:  Procedure Laterality Date  . ABDOMINAL HERNIA REPAIR    . CAROTID ENDARTERECTOMY Left Dec. 3, 2008    cea  . CATARACT EXTRACTION Left   . HYSTEROSCOPY WITH D & C N/A 07/30/2014   Procedure: DILATATION AND CURETTAGE /HYSTEROSCOPY;  Surgeon:  Osborne Oman, MD;  Location: [facility];  Service: Gynecology;  Laterality: N/A;  . ICD IMPLANT N/A 06/13/2020   Procedure: ICD IMPLANT;  Surgeon: Evans Lance, MD;  Location: [facility];  Service: Cardiovascular;  Laterality: N/A;  . IR  THORACENTESIS ASP PLEURAL SPACE W/IMG GUIDE  06/08/2020  . IUD REMOVAL N/A 07/30/2014   Procedure: INTRAUTERINE DEVICE (IUD) REMOVAL;  Surgeon: Osborne Oman, MD;  Location: [facility];  Service: Gynecology;  Laterality: N/A;  . RIGHT HEART CATH N/A 06/07/2020   Procedure: RIGHT HEART CATH;  Surgeon: Larey Dresser, MD;  Location: [facility];  Service: Cardiovascular;  Laterality: N/A;    Family History  Problem Relation Age of Onset  . Cancer Mother        Uterin  . Heart attack Father   . Hypertension Sister     Social History:  Was independent and socially active but sedentary. Has been weaker since Thanksgiving. Reports that she quit smoking about 3 years ago. Her smoking use included cigarettes. She quit after 50.00 years of use. She has never used smokeless tobacco. She reports current alcohol use. She reports that she does not use drugs. Allergies:    Allergies  Allergen Reactions  . Avandia [Rosiglitazone] Other (See Comments)    Causes muscle weakness.  . Latex Rash  . Sulfa Antibiotics Rash    Medications Prior to Admission  Medication Sig Dispense Refill  . acetaminophen (TYLENOL) 500 MG tablet Take 1,000 mg by mouth every 6 (six) hours as needed for mild pain (For back pain.).    Marland Kitchen atorvastatin (LIPITOR) 20 MG tablet TAKE 1 TABLET(20 MG) BY MOUTH DAILY (Patient taking differently: Take 20 mg by mouth daily.) 90 tablet 2  . colestipol (COLESTID) 1 g tablet Take 1 g by mouth 2 (two) times daily.    Marland Kitchen diltiazem (CARDIZEM CD) 180 MG 24 hr capsule TAKE 1 CAPSULE BY MOUTH TWICE DAILY BEFORE MEALS (Patient taking differently: Take 180 mg by mouth in the morning and at bedtime.) 180 capsule 3  . diphenoxylate-atropine (LOMOTIL) 2.5-0.025 MG tablet Take 1 tablet by mouth 2 (two) times daily as needed.    . furosemide (LASIX) 40 MG tablet Take 1 tablet (40 mg total) by mouth 2 (two) times daily. 180 tablet 3  . glimepiride (AMARYL) 2 MG tablet Take 2 mg by mouth daily with  breakfast.  1  . Liniments (SALONPAS ARTHRITIS PAIN RELIEF EX) Apply 1 application topically daily as needed (For lower back pain).    Marland Kitchen lisinopril (PRINIVIL,ZESTRIL) 5 MG tablet Take 2.5 mg by mouth daily.    . Vitamin D, Ergocalciferol, (DRISDOL) 1.25 MG (50000 UNIT) CAPS capsule Take 50,000 Units by mouth once a week.    . warfarin (COUMADIN) 1 MG tablet Take 1 tablet to 1 and 1/2 tablets by mouth daily as directed by the coumadin clinic. (Patient taking differently: Take 1-1.5 mg by mouth See admin instructions. Take 1 tablet by mouth every day except on Tuesdays take  1 and 1/2 tablets by mouth daily as directed by the coumadin clinic.) 40 tablet 0  . ONE TOUCH ULTRA TEST test strip 1 each by Other route as needed (for BG testing).   5    Drug Regimen Review  Drug regimen was reviewed and remains appropriate with no significant issues identified  Home: Home Living Family/patient expects to be discharged to:: Private residence Living Arrangements: Alone Available Help at Discharge: Family,Available PRN/intermittently Type of Home:  Other(Comment) (2 story condo) Home Access: Stairs to enter [employer] of Steps: 3-4 tall steps with R ascending rail- steps are very tall and she has had trouble with this for awhile Entrance Stairs-Rails: Right Home Layout: Two level Alternate Level Stairs-Number of Steps: has a chair lift that she can ride up inside [employer] Shower/Tub: Multimedia programmer: Handicapped height Home Equipment: Shower seat,Cane - single point,Walker - 4 wheels,Hand held shower head,Grab bars - tub/shower Additional Comments: on list for independent condo in [place]   Functional History: Prior Function Level of Independence: Independent Comments: has DME from her late husband but does not really use it  Functional Status:  Mobility: Bed Mobility Overal bed mobility: Needs Assistance Bed Mobility: Supine to Sit,Sit to Supine Supine to sit:  HOB elevated,Min assist Sit to supine: Min assist General bed mobility comments: exiting bed to pts R side so pt could use RUE to elevate trunk into sitting, light MIN A to elevate trunk ( pt reports she exits bed to L side at home), cues for technique in order to maintain ICD precautions. MIN A to return to supine needs assist to elevate BLEs d/t fatigue Transfers Overall transfer level: Needs assistance Equipment used: Rolling walker (2 wheeled) Transfers: Sit to/from Stand Sit to Stand: Min assist Stand pivot transfers: Min guard General transfer comment: MIN A to power into standing and for inital steadying assist as pt reports mild dizziness upon initial stand Ambulation/Gait Ambulation/Gait assistance: Min assist Gait Distance (Feet): 70 Feet (+ 15' x2) Assistive device: Rolling walker (2 wheeled) Gait Pattern/deviations: Step-through pattern,Drifts right/left,Narrow base of support General Gait Details: Slow, mildly unsteady gait with narrow bOS and drifting noted; difficulty with turns needing Min A for support. HR up to 120s with an episode of vtach, which resolved. Gait velocity: decreased Gait velocity interpretation: <1.8 ft/sec, indicate of risk for recurrent falls    ADL: ADL Overall ADL's : Needs assistance/impaired Eating/Feeding: Independent,Sitting Grooming: Min guard,Standing Grooming Details (indicate cue type and reason): stood at sink to locate t.brush and t.paste Upper Body Bathing: Minimal assistance,Sitting Lower Body Bathing: Moderate assistance,Sit to/from stand Upper Body Dressing : Minimal assistance,Sitting Upper Body Dressing Details (indicate cue type and reason): to don gown as back side cover Lower Body Dressing: Maximal assistance,Sitting/lateral leans Lower Body Dressing Details (indicate cue type and reason): to adjust socks from EOB Toilet Transfer: Minimal assistance,RW,Ambulation Toilet Transfer Details (indicate cue type and reason): simulated  via functional mobility ~ 17 Ft with Rw and MIN A Toileting- Clothing Manipulation and Hygiene: Min guard,Sit to/from stand Functional mobility during ADLs: Minimal assistance,Rolling walker General ADL Comments: pt continues to present with decreased activity tolerance, generalized weakness and tachycardia with minimal exertion  Cognition: Cognition Overall Cognitive Status: Within Functional Limits for tasks assessed Orientation Level: Oriented X4 Cognition Arousal/Alertness: Awake/alert Behavior During Therapy: WFL for tasks assessed/performed Overall Cognitive Status: Within Functional Limits for tasks assessed General Comments: very motivated and asking appropriate questions about rehab  Physical Exam: Blood pressure (!) 98/59, pulse 80, temperature (!) 97.5 F (36.4 C), resp. rate 20, height 5\' 8"  (1.727 m), weight 81.1 kg, SpO2 95 %. Physical Exam Vitals reviewed.  Constitutional:      General: She is not in acute distress.    Appearance: She is not ill-appearing.  HENT:     Head: Normocephalic and atraumatic.     Right Ear: External ear normal.     Left Ear: External ear normal.     Nose: Nose  normal.  Eyes:     General:        Right eye: No discharge.        Left eye: No discharge.     Extraocular Movements: Extraocular movements intact.  Cardiovascular:     Comments: Irregularly irregular Pulmonary:     Effort: Pulmonary effort is normal. No respiratory distress.     Breath sounds: No stridor.  Abdominal:     General: Abdomen is flat. Bowel sounds are normal. There is no distension.  Musculoskeletal:     Cervical back: Normal range of motion and neck supple.     Comments: No edema or tenderness in extremities  Skin:    General: Skin is warm.     Comments: Scattered ecchymosis Dressing CDI  Neurological:     Mental Status: She is alert.     Comments: Alert Motor: 4+/5 throughout Sensation diminished to light touch distal to bilateral knees  Psychiatric:         Mood and Affect: Mood normal.        Behavior: Behavior normal.     Results for orders placed or performed during the hospital encounter of 06/02/20 (from the past 48 hour(s))  Glucose, capillary     Status: Abnormal   Collection Time: 06/14/20 11:11 AM  Result Value Ref Range   Glucose-Capillary 295 (H) 70 - 99 mg/dL    Comment: Glucose reference range applies only to samples taken after fasting for at least 8 hours.   Comment 1 Notify RN    Comment 2 Document in Chart   Glucose, capillary     Status: Abnormal   Collection Time: 06/14/20  4:32 PM  Result Value Ref Range   Glucose-Capillary 237 (H) 70 - 99 mg/dL    Comment: Glucose reference range applies only to samples taken after fasting for at least 8 hours.   Comment 1 Notify RN    Comment 2 Document in Chart   Glucose, capillary     Status: Abnormal   Collection Time: 06/14/20  9:26 PM  Result Value Ref Range   Glucose-Capillary 173 (H) 70 - 99 mg/dL    Comment: Glucose reference range applies only to samples taken after fasting for at least 8 hours.  Protime-INR     Status: Abnormal   Collection Time: 06/15/20 12:36 AM  Result Value Ref Range   Prothrombin Time 21.3 (H) 11.4 - 15.2 seconds   INR 1.9 (H) 0.8 - 1.2    Comment: (NOTE) INR goal varies based on device and disease states. Performed at [facility], [place] [address], [place], [place] [address]   CBC     Status: Abnormal   Collection Time: 06/15/20 12:36 AM  Result Value Ref Range   WBC 11.7 (H) 4.0 - 10.5 K/uL   RBC 2.95 (L) 3.87 - 5.11 MIL/uL   Hemoglobin 8.6 (L) 12.0 - 15.0 g/dL   HCT 28.7 (L) 36.0 - 46.0 %   MCV 97.3 80.0 - 100.0 fL   MCH 29.2 26.0 - 34.0 pg   MCHC 30.0 30.0 - 36.0 g/dL   RDW 15.4 11.5 - 15.5 %   Platelets 312 150 - 400 K/uL   nRBC 1.8 (H) 0.0 - 0.2 %    Comment: Performed at [facility], [place] [address] [place], [place] [address]  Magnesium     Status: Abnormal   Collection Time: 06/15/20 12:36 AM  Result Value  Ref Range   Magnesium 2.7 (H) 1.7 -  2.4 mg/dL    Comment: Performed at [facility], [place] [address], [place], [place] [address]  Basic metabolic panel     Status: Abnormal   Collection Time: 06/15/20 12:36 AM  Result Value Ref Range   Sodium 133 (L) 135 - 145 mmol/L   Potassium 4.4 3.5 - 5.1 mmol/L   Chloride 98 98 - 111 mmol/L   CO2 23 22 - 32 mmol/L   Glucose, Bld 215 (H) 70 - 99 mg/dL    Comment: Glucose reference range applies only to samples taken after fasting for at least 8 hours.   BUN 82 (H) 8 - 23 mg/dL   Creatinine, Ser 2.73 (H) 0.44 - 1.00 mg/dL   Calcium 8.6 (L) 8.9 - 10.3 mg/dL   GFR, Estimated 17 (L) >60 mL/min    Comment: (NOTE) Calculated using the CKD-EPI Creatinine Equation (2021)    Anion gap 12 5 - 15    Comment: Performed at [facility] [address] [place], [place] [address]  Glucose, capillary     Status: Abnormal   Collection Time: 06/15/20  6:27 AM  Result Value Ref Range   Glucose-Capillary 159 (H) 70 - 99 mg/dL    Comment: Glucose reference range applies only to samples taken after fasting for at least 8 hours.  Glucose, capillary     Status: Abnormal   Collection Time: 06/15/20 11:33 AM  Result Value Ref Range   Glucose-Capillary 232 (H) 70 - 99 mg/dL    Comment: Glucose reference range applies only to samples taken after fasting for at least 8 hours.  Glucose, capillary     Status: Abnormal   Collection Time: 06/15/20  4:29 PM  Result Value Ref Range   Glucose-Capillary 208 (H) 70 - 99 mg/dL    Comment: Glucose reference range applies only to samples taken after fasting for at least 8 hours.  Glucose, capillary     Status: Abnormal   Collection Time: 06/15/20 10:11 PM  Result Value Ref Range   Glucose-Capillary 201 (H) 70 - 99 mg/dL    Comment: Glucose reference range applies only to samples taken after fasting for at least 8 hours.   Comment 1 Notify RN    Comment 2 Document in Chart   Protime-INR     Status: Abnormal    Collection Time: 06/18/2020 12:39 AM  Result Value Ref Range   Prothrombin Time 20.9 (H) 11.4 - 15.2 seconds   INR 1.9 (H) 0.8 - 1.2    Comment: (NOTE) INR goal varies based on device and disease states. Performed at [facility], [place] [address], [place], [place] [address]   Magnesium     Status: Abnormal   Collection Time: 06/20/2020 12:39 AM  Result Value Ref Range   Magnesium 2.6 (H) 1.7 - 2.4 mg/dL    Comment: Performed at [facility] [address], [place], [place] [address]  Basic metabolic panel     Status: Abnormal   Collection Time: 06/24/2020 12:39 AM  Result Value Ref Range   Sodium 132 (L) 135 - 145 mmol/L   Potassium 4.4 3.5 - 5.1 mmol/L   Chloride 98 98 - 111 mmol/L   CO2 21 (L) 22 - 32 mmol/L   Glucose, Bld 232 (H) 70 - 99 mg/dL    Comment: Glucose reference range applies only to samples taken after fasting for at least 8 hours.   BUN 89 (H) 8 - 23 mg/dL   Creatinine,  Ser 2.88 (H) 0.44 - 1.00 mg/dL   Calcium 8.4 (L) 8.9 - 10.3 mg/dL   GFR, Estimated 16 (L) >60 mL/min    Comment: (NOTE) Calculated using the CKD-EPI Creatinine Equation (2021)    Anion gap 13 5 - 15    Comment: Performed at [facility] [address], [place], [place] [address]  Glucose, capillary     Status: Abnormal   Collection Time: 06/27/2020  6:17 AM  Result Value Ref Range   Glucose-Capillary 183 (H) 70 - 99 mg/dL    Comment: Glucose reference range applies only to samples taken after fasting for at least 8 hours.   Comment 1 Notify RN    Comment 2 Document in Chart    No results found.     Medical Problem List and Plan: 1.  Limited by weakness and hypotension, poor posture, as well as DOE/tachycardia with activity secondary to cardiac debility.  -patient may shower with incision covered  -ELOS/Goals: 3-5 days/supervision/mod I  Admit to CIR 2.  Antithrombotics: -DVT/anticoagulation:  Pharmaceutical: Coumadin  -antiplatelet therapy: N/A 3. Neuropathy/Pain  Management: Ultram prn for LUE pain  -- tylenol 1000 mg tid-->decrease to 650 mg tid.   Monitor with increased exertion 4. Mood: LCSW to follow for evaluation and support.   -antipsychotic agents: N/A 5. Neuropsych: This patient is capable of making decisions on her own behalf. 6. Skin/Wound Care: Routine pressure relief measures 7. Fluids/Electrolytes/Nutrition: Strict I/Os.   Daily CBC per cardiology 8. Acute on chronic systolic CHF: Strict I/O.   Torsemide resumed 1/10 --received extra 20 mg today .    On Lipitor, Colestid, Demadex BB due to soft BP/ no ace due to to CKD.  Daily weights 9. Acute on chronic Stage IV: Continue midodrine tid-->increased 11/12 to allow for adequate perfusion.   SCr up to 2.88 on 1/12  Daily BMP 10. Hyponatremia: Monitor with daily checks.   Sodium 132 on 1/12 11. Leucocytosis: Monitor for signs of infection and for fevers.   CBC ordered for tomorrow 12. A fib: Monitor HR tid. On Pacerone 200 mg bid and coumadin.  13. Aspiration PNA: Completed 10 day course of ceftriaxone on 1/10 14. Chest wall pain: Continue Lidoderm patches with tramadol prn.  15. VT arrest s/p ICD 1/09: On pacer precautions.  16. T2DM: Monitor BS ac/hs. Blood sugars trending upwards-was on glimepiride at home but intake has been poor.  Continue SSI for now.   Monitor with increased mobility 17. Ulcerative colitis: On mesalamine--has been refusing Rx-->daughter to bring her med from home as this is better tolerated.  18. Constipation:  Has not had BM since VT per family--added miralax for now and d/c once mesalamine on board (as had diarrhea SE)    Just bowel meds as necessary 19. Anemia of chronic disease:  Repeat fareheme today. Monitor H/H with serial checks.    CBC ordered for tomorrow 20.  Nausea and vomiting  Zofran as needed  See #18   Bary Leriche, PA-C 06/28/2020  I have personally performed a face to face diagnostic evaluation, including, but not limited to relevant  history and physical exam findings, of this patient and developed relevant assessment and plan.  Additionally, I have reviewed and concur with the physician assistant's documentation above.  Delice Lesch, MD, ABPMR  The patient's status has not changed. Any changes from the pre-admission screening or documentation from the acute chart are noted above.   Delice Lesch, MD, ABPMR

## 2020-06-16 NOTE — Progress Notes (Addendum)
Patient ID: Cynthia Hansen, female   DOB: Oct 28, 1940, 80 y.o.   MRN: [id number] P    Advanced Heart Failure Rounding Note  PCP-Cardiologist: Larae Grooms, MD   Subjective:    - 06/05/20  patient had pulseless VT requiring shock x 1 and CPR x 4 minutes.  She had rapid recovery and was not intubated.  Electrolytes stable.  Amiodarone gtt started.  Due to low BP, she was started on norepinephrine 4 peripherally.  Creatinine rose 2.1 => 2.36 and HS-TnI rose to 7990.  Currently, only mild chest wall tenderness post-CPR.   - 06/08/19 RHC demonstrated mildly elevated filling pressures and relatively preserved CO. No LHC due to abnormal renal function. cMRI limited study. Very difficult images due to respiratory artifact and atrial fibrillation.  - 06/09/19 S/P Thoracentesis on R. 700 cc removed.  - 1/9 [employer] ICD placed.   Restarted on torsemide 1/10. Slight wt gain over the last several days but stable past 24 hrs at 178 lb. Unfortunately, UOP not charted despite strict I/Os being ordered. Pt self reports UOP has slowed. No resting dyspnea. Mild SOB w/ exertion.   Scr up slightly from 2.73>>2.88. BUN 89.   Continues in rate controlled Afib. No further VT.   Approved for CIR. Waiting on bed.    Objective:   Weight Range: 81.1 kg Body mass index is 27.19 kg/m.   Vital Signs:   Temp:  [97.6 F (36.4 C)-97.9 F (36.6 C)] 97.7 F (36.5 C) (01/12 0725) Pulse Rate:  [65-97] 85 (01/12 0730) Resp:  [16-20] 20 (01/12 0250) BP: (96-116)/(52-76) 101/58 (01/12 0725) SpO2:  [95 %-99 %] 95 % (01/12 0730) Weight:  [81.1 kg] 81.1 kg (01/12 0310) Last BM Date: 06/12/20  Weight change: Filed Weights   06/14/20 0500 06/15/20 0545 07/02/2020 0310  Weight: 79.8 kg 80.8 kg 81.1 kg    Intake/Output:  No intake or output data in the 24 hours ending 06/09/2020 0915    Physical Exam   PHYSICAL EXAM: General:  Well appearing. No respiratory difficulty HEENT: normal Neck: supple.  JVD elevated to  jaw. Carotids 2+ bilat; no bruits. No lymphadenopathy or thyromegaly appreciated. Cor: PMI nondisplaced. Irregularly irregular rhythm, regular rate. No rubs, gallops or murmurs. Lungs: clear Abdomen: soft, nontender, nondistended. No hepatosplenomegaly. No bruits or masses. Good bowel sounds. Extremities: no cyanosis, clubbing, rash, trace ankle edema Neuro: alert & oriented x 3, cranial nerves grossly intact. moves all 4 extremities w/o difficulty. Affect pleasant.   Telemetry    A Fib 80s. No further VT. Personally reviewed.    Labs    CBC Recent Labs    06/14/20 0050 06/15/20 0036  WBC 11.6* 11.7*  HGB 8.6* 8.6*  HCT 28.8* 28.7*  MCV 97.6 97.3  PLT 287 716   Basic Metabolic Panel Recent Labs    06/15/20 0036 06/14/2020 0039  NA 133* 132*  K 4.4 4.4  CL 98 98  CO2 23 21*  GLUCOSE 215* 232*  BUN 82* 89*  CREATININE 2.73* 2.88*  CALCIUM 8.6* 8.4*  MG 2.7* 2.6*   Liver Function Tests No results for input(s): AST, ALT, ALKPHOS, BILITOT, PROT, ALBUMIN in the last 72 hours. No results for input(s): LIPASE, AMYLASE in the last 72 hours. Cardiac Enzymes No results for input(s): CKTOTAL, CKMB, CKMBINDEX, TROPONINI in the last 72 hours.  BNP: BNP (last 3 results) Recent Labs    05/27/20 0828 06/02/20 1832  BNP 2,213.1* 2,338.9*    ProBNP (last 3 results) Recent Labs  05/13/20 1550 05/31/20 0939  PROBNP 43,329* 51,884*     D-Dimer No results for input(s): DDIMER in the last 72 hours. Hemoglobin A1C No results for input(s): HGBA1C in the last 72 hours. Fasting Lipid Panel No results for input(s): CHOL, HDL, LDLCALC, TRIG, CHOLHDL, LDLDIRECT in the last 72 hours. Thyroid Function Tests No results for input(s): TSH, T4TOTAL, T3FREE, THYROIDAB in the last 72 hours.  Invalid input(s): FREET3  Other results:   Imaging    No results found.   Medications:     Scheduled Medications: . acetaminophen  1,000 mg Oral TID  . amiodarone  200 mg Oral  BID  . aspirin  81 mg Oral Daily  . atorvastatin  80 mg Oral Daily  . colestipol  1 g Oral BID  . hydrocortisone cream   Topical QID  . insulin aspart  0-5 Units Subcutaneous QHS  . insulin aspart  0-9 Units Subcutaneous TID WC  . lidocaine  2 patch Transdermal Q24H  . melatonin  3 mg Oral QHS  . mesalamine  4.8 g Oral Q breakfast  . methimazole  5 mg Oral Daily  . midodrine  5 mg Oral TID WC  . ondansetron (ZOFRAN) IV  4 mg Intravenous Once  . sodium chloride flush  3 mL Intravenous Q12H  . sodium chloride flush  3 mL Intravenous Q12H  . torsemide  20 mg Oral Daily  . Warfarin - Pharmacist Dosing Inpatient   Does not apply q1600    Infusions: . sodium chloride 10 mL/hr at 06/13/20 0348  . sodium chloride    . ferumoxytol Stopped (06/10/20 1310)    PRN Medications: sodium chloride, acetaminophen, ondansetron (ZOFRAN) IV, polyethylene glycol, sodium chloride flush, traMADol   Assessment/Plan   1. Acute on chronic systolic CHF: Cardiolite in 11/21 with EF 26%, inferior fixed perfusion defect with no ischemia.  Echo in 12/21 with EF 35-40%, moderate LV dilation, mildly decreased RV function. Cause of cardiomyopathy uncertain but there is concern for ischemic cardiomyopathy. Cardiac MRI was difficult study, EF 25-30% estimated without definitive LGE. Worsening CHF recently complicated by CKD stage IV. She has had NYHA class III symptoms.  She diuresed well initially but had VT arrest.  RHC this admit w/ mildly elevated filling pressures, mild pulmonary HTN and relatively preserved CO. Creatinine up slightly today from 2.73>>2.88, mild volume overload on exam. Wt trending up. SBP 90s- low 100s on midodrine.  - On torsemide 20 mg daily. UOP not charted despite strict I/Os being ordered but pt self-reports decrease in UOP compared to days prior.   - Increase Torsemide to 40 mg daily. Personally d/w RN urine collection/charting strict I/IOs. Follow BMP - Coreg on hold for hypotension and  dapagliflozin stopped with AKI.  - Increase midodrine to 10 mg tid to keep SBP persistently > 110 for renal perfusion  - Will be limited in terms of HF meds we can use with CKD 4, history of hyperkalemia, and soft BP. For now, no ARNI/ARB/digoxin/spironolactone.   - Will be [place] candidate. - Ideally, would have coronary angiography to look for CAD as cause of cardiomyopathy and VT.  However, not candidate at this time in absence of ACS given AKI.  2. AKI on CKD Stage IV: Creatinine has plateaued. RHC showed relatively preserved cardiac output.  - SBPs 90s-low 100s  - Increase midodrine to 10 mg tid keep SBP >110 for renal perfusion  - Nephrology following. Appreciate their assistance.  3. Chronic atrial fibrillation: This appears permanent, x  years.  Has biatrial severe enlargement and unlikely to hold NSR with DCCV attempt. INR 1.9 today.  - Continue warfarin.  4. Low TSH: Concern for hyperthyroidism.  Free T4 is high, but free T3 is normal. Thyroid stimulating antibody negative  - Discussed abnormal TFTs with endocrinology, started on  low dose methimazole 5 mg qam followup with endocrinology as outpatient. 5. CAD: NSTEMI with HS-TnI up to 7990 post-VT arrest and CPR.  As above, have been concerned for CAD as cause of cardiomyopathy.  ?Rise in troponin due to demand ischemia versus ACS => no ischemic pain (just chest wall tenderness post-CPR) and no ECG change (old ASMI as in past).    - Continue atorvastatin 80 mg daily.  - Continue ASA 81 daily => stop when INR therapeutic.  - Ideally, would do coronary angiography but think we need improvement in creatinine for that in absence of ACS.  - cMRI w/ poor imaging, Very difficult delayed enhancement images. Possible subtle subendocardial LGE in the mid inferior wall seen on the short axis images, but this could not be confirmed in the long axis images. There does not appear to be a large amount of scar. 6. Cardiac arrest: Pulseless VT.   Electrolytes stable. No chest pain or dyspnea prior.  ?Cause => suspect scar-mediated.  - Continue amiodarone 200 mg po bid.  - S/p ICD placement 1/9. EP has signed off  7. Pulmonary: RLL PNA post-arrest by CXR.  Suspect aspiration. Afebrile.  - Completed course of ceftriaxone.  8. Pleural Effusion: S/P R Thoracentesis with 700 cc removed. Transudate from CHF.  - repeat CXR 1/10 w/ small bilateral pleural effusions 9. Hyponatremia: Na mildly low 132 today   - continue diuresis  10. Anemia: Hgb 8.6.  ? Anemia of chronic disease from CKD. No overt GI bleeding but has Fe deficiency.  - FOBT negative.  - Received feraheme.  11. Neuropathic pain left arm:  Chronic.  Tylenol does not help, she has had side effects with gabapentin.  - Trial of tramadol.   12. Nausea - Prn Zofran  13. Deconditioning - continue to mobilize w/ PT - d/c to CIR once bed is available   Length of Stay: [address] PA-C  06/12/2020, 9:15 AM  Advanced Heart Failure Team Pager [phone] (M-F; [place])  Please contact [facility] for night-coverage after hours (4p -7a ) and weekends on [email or web address]  Patient seen and examined with the above-signed Advanced Practice Provider and/or Housestaff. I personally reviewed laboratory data, imaging studies and relevant notes. I independently examined the patient and formulated the important aspects of the plan. I have edited the note to reflect any of my changes or salient points. I have personally discussed the plan with the patient and/or family.  Much improved today. Able to walk a little. Still very weak. No further VT. Volume status ok.  General:  Elderly No resp difficulty HEENT: normal Neck: supple. no JVD. Carotids 2+ bilat; no bruits. No lymphadenopathy or thryomegaly appreciated. Cor: PMI nondisplaced. Regular rate & rhythm. No rubs, gallops or murmurs. Mild tenderness over ICD site Lungs: clear Abdomen: soft, nontender, nondistended. No  hepatosplenomegaly. No bruits or masses. Good bowel sounds. Extremities: no cyanosis, clubbing, rash, edema Neuro: alert & orientedx3, cranial nerves grossly intact. moves all 4 extremities w/o difficulty. Affect pleasant  Rhythm and volume status stable. Agree with transfer to CIR. Meds reviewed.    Glori Bickers, MD  5:43 PM

## 2020-06-16 NOTE — Progress Notes (Signed)
Inpatient Rehabilitation Medication Review by a Pharmacist  A complete drug regimen review was completed for this patient to identify any potential clinically significant medication issues.  Clinically significant medication issues were identified:  no  Check AMION for pharmacist assigned to patient if future medication questions/issues arise during this admission.  Pharmacist comments:   Consider resuming Vitamin D 1.25 mg (50,000 units) po once weekly as recommended on discharge summary per Dr. Haroldine Laws.  Time spent performing this drug regimen review (minutes):  [place], [place] Clinical Pharmacist 06/22/2020 10:01 PM

## 2020-06-16 NOTE — Progress Notes (Signed)
[facility] Progress Note   Assessment/ Plan:   1. AKI on CKD Stage IV - Baseline appears 1.8-2.3. AKI 2/2 ATN w/ arrest but also related to CHF and relative hypotension leading to decrease filtration. This is likely why Crt has plateaued. Cr slightly up to 2.9 but overall within the range she's been at more recently, suspecting this is going to be around her new baseline - agree with increasing midodrine to 10mg  tid - Continue holding Farxiga and ACEI/ARB 2. Acute systolic CHF w/ cardiac arrest: TTE 12/21 with EF 35% - 40% - AHF following - s/p ICD 1/9 -on torsemide  3. Iron Deficiency anemia: CKD may be contributing. S/p feraheme x1 and redose next week. 4. Hyponatremia: stable 5. Nutrition:Low sodium diet   Dispo: CIR  Subjective:   Cynthia Hansen is a 80 y.o. with PMH of Afib, DM II, HTN, h/o CEA and new dx of dilated cardiomyopathy with EF 30-35% who is seen in consultation for AKI on CKD. She is on hospital day 14.  No acute events, no complaints.   Objective:   BP (!) 98/59 (BP Location: Right Arm)   Pulse 80   Temp (!) 97.5 F (36.4 C)   Resp 20   Ht 5\' 8"  (1.727 m)   Wt 81.1 kg   SpO2 95%   BMI 27.19 kg/m   Physical Exam: FFM:BWGYK in bed, NAD CVS: normal rate, no murmur  Resp: Normal work of breathing , bilateral chest rise Abd: Soft, NT Ext: No edema, warm, left arm in sling   Labs: BMET Recent Labs  Lab 06/10/20 0902 06/11/20 0051 06/12/20 0059 06/13/20 0105 06/14/20 0050 06/15/20 0036 06/14/2020 0039  NA 134* 134* 135 134* 133* 133* 132*  K 4.2 4.1 4.2 4.4 4.3 4.4 4.4  CL 99 101 102 101 101 98 98  CO2 20* 20* 21* 20* 19* 23 21*  GLUCOSE 155* 157* 113* 155* 255* 215* 232*  BUN 74* 76* 75* 77* 79* 82* 89*  CREATININE 2.84* 2.85* 2.88* 2.85* 2.72* 2.73* 2.88*  CALCIUM 8.8* 8.8* 8.6* 8.8* 8.3* 8.6* 8.4*  PHOS 5.3*  --   --   --   --   --   --    CBC Recent Labs  Lab 06/12/20 0059 06/13/20 0105 06/14/20 0050 06/15/20 0036   WBC 8.3 9.7 11.6* 11.7*  HGB 8.7* 8.7* 8.6* 8.6*  HCT 27.0* 26.9* 28.8* 28.7*  MCV 94.4 93.1 97.6 97.3  PLT 265 298 287 312      Medications:    . acetaminophen  1,000 mg Oral TID  . amiodarone  200 mg Oral BID  . aspirin  81 mg Oral Daily  . atorvastatin  80 mg Oral Daily  . colestipol  1 g Oral BID  . hydrocortisone cream   Topical QID  . insulin aspart  0-5 Units Subcutaneous QHS  . insulin aspart  0-9 Units Subcutaneous TID WC  . lidocaine  2 patch Transdermal Q24H  . melatonin  3 mg Oral QHS  . mesalamine  4.8 g Oral Q breakfast  . methimazole  5 mg Oral Daily  . midodrine  10 mg Oral TID WC  . ondansetron (ZOFRAN) IV  4 mg Intravenous Once  . sodium chloride flush  3 mL Intravenous Q12H  . sodium chloride flush  3 mL Intravenous Q12H  . torsemide  20 mg Oral Daily  . torsemide  20 mg Oral Once  . [START ON 06/17/2020] torsemide  40 mg  Oral Daily  . Warfarin - Pharmacist Dosing Inpatient   Does not apply [id number]     SYSDB NRWKE  06/15/2020, 11:05 AM

## 2020-06-16 NOTE — Progress Notes (Signed)
Jamse Arn, MD  Physician  Physical Medicine and Rehabilitation  PMR Pre-admission     Signed  Date of Service:  06/25/2020 11:36 AM      Related encounter: Admission (Discharged) from 06/02/2020 in [place]          Show:Clear all '[x]' Manual'[x]' Template'[x]' Copied  Added by: '[x]' Cristina Gong, RN'[x]' Jamse Arn, MD   '[]' Hover for details  PMR Admission Coordinator Pre-Admission Assessment   Patient: [name] is an 80 y.o., female MRN: [id number] DOB: 1941-01-03 Height: '5\' 8"'  (172.7 cm) Weight: 81.1 kg   Insurance Information HMO:     PPO: yes     PCP:      IPA:      80/20:      OTHER:  PRIMARY: [employer]      Policy#: [id number]      Subscriber: pt CM Name: Tammy      Phone#: [phone]     Fax#: Epic access Pre-Cert#: [id number] approved for 7 days      Employer:  Benefits:  Phone #: [phone]     Name: 1/10 Eff. Date: 06/05/2020     Deduct: none      Out of Pocket Max: $3400      Life Max: none CIR: $325 co pay per day days 1 until 6      SNF: no copay days 1 until 20; $184 co pay per day days 21 until 100 Outpatient: $30 per visit     Co-Pay: visits per medical neccesity Home Health: 100%      Co-Pay: visits per medical neccesity DME: 50%     Co-Pay: 50% Providers: in network  SECONDARY: none   Financial Counselor:       Phone#:    [employer] Engineer[employer]" for patients in Inpatient Rehabilitation Facilities with attached "Privacy Act [place] Records" was provided and verbally reviewed with: Patient   Emergency Contact Information         Contact Information     Name Relation Home Work Mobile    [place] Daughter     [phone]    [place], [place] Sister     [phone]    Sherald Barge     [phone]         Current Medical History  Patient Admitting Diagnosis: Debility s/p ICD placement   History of Present Illness: 80 year old female with  history of A fib- on coumadin, T2DM, ulcerative colitis, tobacco use, recent diagnosis of systolic CHF with difficulty managing on outpatient basis. She was admitted on 06/02/20 with SOB , fluid overload with marked LLE edema and acute on chronic renal failure with hyperkalemia. She was started on IV lasix with plans for cardiac cath to work up cardiomyopathy with EF 35-40%. Hospital course significant for code blue 01/01 due to pulseless VT requiring CPR/shock with ROSC after 4 minutes as well as question of aspiration event. She was started on ceftriaxone for treatment and underwent right thoracocentesis of clear yellow 700 cc on 01/04. Nephrology consulted for input on acute on chronic renal failure with hyponateremia and felt that AKI likely multifactorial due to Frarxiga/hypotension/VT. Dr. Hollie Salk recommended increasing midodrine for BP support and renal ultrasound done revealing moderate right renal parenchyma atrophy and atropic/poorly visualized left kidney. Iron deficiency anemia supplemented with feraheme X 1. SCR has stabilized to 2.9 range and felt to be her new baseline. Daily BMET recommended.  Dr. Lovena Le consulted for input on VT and recommended ICD which was placed on 01/09.     Patient's medical record from [facility] has been reviewed by the rehabilitation admission coordinator and physician.   Past Medical History      Past Medical History:  Diagnosis Date  . A-fib ([place])    . Acute on chronic combined systolic (congestive) and diastolic (congestive) heart failure ([place])    . Carotid artery occlusion    . DCM (dilated cardiomyopathy) ([place])      EF 30-35% by echo 05/2020  . Diabetes mellitus    . Hyperlipidemia    . Irregular heart beat    . Vitamin D deficiency        Family History   family history includes Cancer in her mother; Heart attack in her father; Hypertension in her sister.   Prior Rehab/Hospitalizations Has the patient had prior rehab or hospitalizations  prior to admission? Yes   Has the patient had major surgery during 100 days prior to admission? Yes              Current Medications   Current Facility-Administered Medications:  .  0.9 %  sodium chloride infusion, 250 mL, Intravenous, PRN, Evans Lance, MD, Last Rate: 10 mL/hr at 06/13/20 0348, Rate Verify at 06/13/20 0348 .  0.9 %  sodium chloride infusion, 250 mL, Intravenous, Continuous, Evans Lance, MD .  acetaminophen (TYLENOL) tablet 1,000 mg, 1,000 mg, Oral, TID, Evans Lance, MD, 1,000 mg at 06/11/2020 0755 .  acetaminophen (TYLENOL) tablet 325-650 mg, 325-650 mg, Oral, Q4H PRN, Evans Lance, MD, 650 mg at 06/14/20 0715 .  amiodarone (PACERONE) tablet 200 mg, 200 mg, Oral, BID, Evans Lance, MD, 200 mg at 06/05/2020 0757 .  aspirin chewable tablet 81 mg, 81 mg, Oral, Daily, Evans Lance, MD, 81 mg at 06/13/2020 0757 .  atorvastatin (LIPITOR) tablet 80 mg, 80 mg, Oral, Daily, Evans Lance, MD, 80 mg at 06/29/2020 0756 .  colestipol (COLESTID) tablet 1 g, 1 g, Oral, BID, Evans Lance, MD, 1 g at 06/30/2020 0756 .  ferumoxytol (FERAHEME) 510 mg in sodium chloride 0.9 % 100 mL IVPB, 510 mg, Intravenous, Weekly, Evans Lance, MD, Stopped at 06/10/20 1310 .  hydrocortisone cream 1 %, , Topical, QID, Evans Lance, MD, 1 application at 74/25/95 2246 .  insulin aspart (novoLOG) injection 0-5 Units, 0-5 Units, Subcutaneous, QHS, Evans Lance, MD, 2 Units at 06/12/20 2245 .  insulin aspart (novoLOG) injection 0-9 Units, 0-9 Units, Subcutaneous, TID WC, Evans Lance, MD, 2 Units at 06/20/2020 [phone] .  lidocaine (LIDODERM) 5 % 2 patch, 2 patch, Transdermal, Q24H, Evans Lance, MD, 2 patch at 06/14/2020 1139 .  melatonin tablet 3 mg, 3 mg, Oral, QHS, Evans Lance, MD, 3 mg at 06/15/20 2107 .  mesalamine (LIALDA) EC tablet 4.8 g, 4.8 g, Oral, Q breakfast, Evans Lance, MD, 4.8 g at 06/14/20 0850 .  methimazole (TAPAZOLE) tablet 5 mg, 5 mg, Oral, Daily, Evans Lance,  MD, 5 mg at 06/20/2020 0755 .  midodrine (PROAMATINE) tablet 10 mg, 10 mg, Oral, TID WC, Simmons, Brittainy M, PA-C, 10 mg at 06/14/2020 1140 .  ondansetron (ZOFRAN) injection 4 mg, 4 mg, Intravenous, Once, Evans Lance, MD .  ondansetron [facility]) injection 4 mg, 4 mg, Intravenous, Q6H PRN, Evans Lance, MD, 4 mg at 06/12/2020 0751 .  polyethylene glycol (MIRALAX / GLYCOLAX) packet  17 g, 17 g, Oral, Daily PRN, Evans Lance, MD .  sodium chloride flush (NS) 0.9 % injection 3 mL, 3 mL, Intravenous, Q12H, Evans Lance, MD, 3 mL at 06/15/20 2110 .  sodium chloride flush (NS) 0.9 % injection 3 mL, 3 mL, Intravenous, PRN, Evans Lance, MD .  sodium chloride flush (NS) 0.9 % injection 3 mL, 3 mL, Intravenous, Q12H, Evans Lance, MD, 3 mL at 06/15/20 2110 .  torsemide (DEMADEX) tablet 20 mg, 20 mg, Oral, Daily, Larey Dresser, MD, 20 mg at 06/18/2020 0755 .  [START ON 06/17/2020] torsemide (DEMADEX) tablet 40 mg, 40 mg, Oral, Daily, Simmons, Brittainy M, PA-C .  traMADol (ULTRAM) tablet 50 mg, 50 mg, Oral, Q12H PRN, Evans Lance, MD, 50 mg at 06/15/20 2110 .  Warfarin - Pharmacist Dosing Inpatient, , Does not apply, q1600, Pat Patrick, RPH, Given at 06/15/20 1825   Patients Current Diet:     Diet Order                      Diet Heart Room service appropriate? Yes; Fluid consistency: Thin  Diet effective now                      Precautions / Restrictions Precautions Precautions: Fall,Other (comment),ICD/Pacemaker Precaution Comments: watch HR, chronic BLE numbness, ICD precautions ( placement 1/9) Restrictions Weight Bearing Restrictions: No    Has the patient had 2 or more falls or a fall with injury in the past year? No   Prior Activity Level Limited Community (1-2x/wk): decline in fucntion just in few weeks pta; was living alone and driving   Prior Functional Level Self Care: Did the patient need help bathing, dressing, using the toilet or eating? Independent    Indoor Mobility: Did the patient need assistance with walking from room to room (with or without device)? Independent   Stairs: Did the patient need assistance with internal or external stairs (with or without device)? Independent   Functional Cognition: Did the patient need help planning regular tasks such as shopping or remembering to take medications? Independent   Home Assistive Devices / Equipment Home Assistive Devices/Equipment: None Home Equipment: Shower seat,Cane - single point,Walker - 4 wheels,Hand held shower head,Grab bars - tub/shower   Prior Device Use: Indicate devices/aids used by the patient prior to current illness, exacerbation or injury? None of the above   Current Functional Level Cognition   Overall Cognitive Status: Within Functional Limits for tasks assessed Orientation Level: Oriented X4 General Comments: very motivated and asking appropriate questions about rehab    Extremity Assessment (includes Sensation/Coordination)   Upper Extremity Assessment: Generalized weakness  Lower Extremity Assessment: Defer to PT evaluation     ADLs   Overall ADL's : Needs assistance/impaired Eating/Feeding: Independent,Sitting Grooming: Min guard,Standing Grooming Details (indicate cue type and reason): stood at sink to locate t.brush and t.paste Upper Body Bathing: Minimal assistance,Sitting Lower Body Bathing: Moderate assistance,Sit to/from stand Upper Body Dressing : Minimal assistance,Sitting Upper Body Dressing Details (indicate cue type and reason): to don gown as back side cover Lower Body Dressing: Maximal assistance,Sitting/lateral leans Lower Body Dressing Details (indicate cue type and reason): to adjust socks from EOB Toilet Transfer: Minimal assistance,RW,Ambulation Toilet Transfer Details (indicate cue type and reason): simulated via functional mobility ~ 70 Ft with Rw and MIN A Toileting- Clothing Manipulation and Hygiene: Min guard,Sit to/from  stand Functional mobility during ADLs: Minimal assistance,Rolling walker General ADL  Comments: pt continues to present with decreased activity tolerance, generalized weakness and tachycardia with minimal exertion     Mobility   Overal bed mobility: Needs Assistance Bed Mobility: Supine to Sit,Sit to Supine Supine to sit: HOB elevated,Min assist Sit to supine: Min assist General bed mobility comments: exiting bed to pts R side so pt could use RUE to elevate trunk into sitting, light MIN A to elevate trunk ( pt reports she exits bed to L side at home), cues for technique in order to maintain ICD precautions. MIN A to return to supine needs assist to elevate BLEs d/t fatigue     Transfers   Overall transfer level: Needs assistance Equipment used: Rolling walker (2 wheeled) Transfers: Sit to/from Stand Sit to Stand: Min assist Stand pivot transfers: Min guard General transfer comment: MIN A to power into standing and for inital steadying assist as pt reports mild dizziness upon initial stand     Ambulation / Gait / Stairs / Wheelchair Mobility   Ambulation/Gait Ambulation/Gait assistance: Herbalist (Feet): 70 Feet (+ 15' x2) Assistive device: Rolling walker (2 wheeled) Gait Pattern/deviations: Step-through pattern,Drifts right/left,Narrow base of support General Gait Details: Slow, mildly unsteady gait with narrow bOS and drifting noted; difficulty with turns needing Min A for support. HR up to 120s with an episode of vtach, which resolved. Gait velocity: decreased Gait velocity interpretation: <1.8 ft/sec, indicate of risk for recurrent falls     Posture / Balance Dynamic Sitting Balance Sitting balance - Comments: pt able to statically sit EOB with minguard Balance Overall balance assessment: Needs assistance Sitting-balance support: Feet supported,Single extremity supported Sitting balance-Leahy Scale: Fair Sitting balance - Comments: pt able to statically sit EOB  with minguard Standing balance support: During functional activity Standing balance-Leahy Scale: Poor Standing balance comment: Requires UE support in standing.     Special needs/care consideration    ICD placement 06/13/2020    Previous Home Environment  Living Arrangements: Alone  Lives With: Alone Available Help at Discharge: Family,Available PRN/intermittently Type of Home: Other(Comment) Home Layout: Two level Alternate Level Stairs-Number of Steps: has a chair lift that she can ride up inside Home Access: Stairs to enter Entrance Stairs-Rails: Right Entrance Stairs-Number of Steps: 3-4 tall steps with R ascending rail- steps are very tall and she has had trouble with this for awhile Bathroom Shower/Tub: Multimedia programmer: Handicapped height Bathroom Accessibility: Yes How Accessible: Accessible via walker [place]: No Additional Comments: on list for independent condo in [place]   Discharge Living Setting Plans for Discharge Living Setting: Patient's home,Alone Type of Home at Discharge: Other (Comment) (condo) Discharge Home Layout: Two level Alternate Level Stairs-Rails:  (has chair lift) Discharge Home Access: Stairs to enter Entrance Stairs-Rails: Right Entrance Stairs-Number of Steps: 3 to 4 Discharge Bathroom Shower/Tub: Walk-in shower Discharge Bathroom Toilet: Handicapped height Discharge Bathroom Accessibility: Yes How Accessible: Accessible via walker Does the patient have any problems obtaining your medications?: No   Social/Family/Support Systems Patient Roles: Parent Contact Information: daughter, Cynthia Hansen Anticipated Caregiver: Cynthia Hansen Anticipated Caregiver's Contact Information: see above Ability/Limitations of Caregiver: Cynthia Hansen works and can provide intermittent IT trainer Availability: Intermittent Discharge Plan Discussed with Primary Caregiver: Yes Is Caregiver In Agreement with Plan?: Yes Does Caregiver/Family have  Issues with Lodging/Transportation while Pt is in Rehab?: No   Goals Patient/Family Goal for Rehab: Mod I with PT and OT Expected length of stay: ELOS 10 to 12 days Pt/Family Agrees to Admission and willing to participate:  Yes Program Orientation Provided & Reviewed with Pt/Caregiver Including Roles  & Responsibilities: Yes   Decrease burden of Care through IP rehab admission: n/a   Possible need for SNF placement upon discharge: not anticipated   Patient Condition: I have reviewed medical records from [facility] , spoken with patient and daughter. I met with patient at the bedside for inpatient rehabilitation assessment.  Patient will benefit from ongoing PT and OT, can actively participate in 3 hours of therapy a day 5 days of the week, and can make measurable gains during the admission.  Patient will also benefit from the coordinated team approach during an Inpatient Acute Rehabilitation admission.  The patient will receive intensive therapy as well as Rehabilitation physician, nursing, social worker, and care management interventions.  Due to bladder management, bowel management, safety, skin/wound care, disease management, medication administration, pain management and patient education the patient requires 24 hour a day rehabilitation nursing.  The patient is currently min to mod assist overall with mobility and basic ADLs.  Discharge setting and therapy post discharge at home with home health is anticipated.  Patient has agreed to participate in the Acute Inpatient Rehabilitation Program and will admit today.   Preadmission Screen Completed By:  Cleatrice Burke, 07/01/2020 11:55 AM ______________________________________________________________________   Discussed status with Dr. Posey Pronto  on  06/12/2020 at  34 and received approval for admission today.   Admission Coordinator:  Cleatrice Burke, RN, time  0737 Date  06/22/2020    Assessment/Plan: Diagnosis: Debility   1. Does the need for close, 24 hr/day Medical supervision in concert with the patient's rehab needs make it unreasonable for this patient to be served in a less intensive setting? Yes 2. Co-Morbidities requiring supervision/potential complications: A fib (monitor with increased exertion), T2 DM (Monitor in accordance with exercise and adjust meds as necessary), ulcerative colitis, tobacco use, recent diagnosis of systolic CHF (Monitor in accordance with increased physical activity and avoid UE resistance excercises)  3. Due to safety, skin/wound care, disease management, pain management and patient education, does the patient require 24 hr/day rehab nursing? Yes 4. Does the patient require coordinated care of a physician, rehab nurse, PT, OT to address physical and functional deficits in the context of the above medical diagnosis(es)? Yes Addressing deficits in the following areas: balance, endurance, locomotion, strength, transferring, bathing, dressing, toileting and psychosocial support 5. Can the patient actively participate in an intensive therapy program of at least 3 hrs of therapy 5 days a week? Yes 6. The potential for patient to make measurable gains while on inpatient rehab is excellent 7. Anticipated functional outcomes upon discharge from inpatient rehab: modified independent and supervision PT, modified independent and supervision OT, n/a SLP 8. Estimated rehab length of stay to reach the above functional goals is: 10-14 days. 9. Anticipated discharge destination: Home 10. Overall Rehab/Functional Prognosis: good     MD Signature: Delice Lesch, MD, ABPMR          Revision History                        Note Details  Author Jamse Arn, MD File Time 07/05/2020 12:02 PM  Author Type Physician Status Signed  Last Editor Jamse Arn, MD Service Physical Medicine and [place] # [id number] Admit Date 06/30/2020

## 2020-06-16 NOTE — Progress Notes (Signed)
CARDIAC REHAB PHASE I   PRE:  Rate/Rhythm: 84 afib  BP:  Supine: 104/64  Sitting:   Standing:    SaO2: 98%RA  MODE:  Ambulation: 160 ft   POST:  Rate/Rhythm: 123 afib  BP:  Supine: 89/63, 102/64  Sitting:   Standing:    SaO2: 95%RA 0910-0950 Pt stated it took 2 people to get her to standing from [facility] this morning. Stated couldn't use left arm and legs were weak. Assisted pt to pivot for BSC. Had pt rock and she needed asst x 1 to stand. Legs are weak. Pt walked 160 ft on RA with rollator and gait belt use, and asst x 1. Pt stopped several times due to legs weak but did not need to sit. Really tired by end of walk and needed assistance to get legs up in bed. C/o neck and back pain after walk. Got pt settled. BP at 89/63 right after walk but next BP at 102/64. Encouraged walks with PT and staff. Denied dizziness with lower BP. Bed alarm on and call bell in reach.   Graylon Good, RN BSN  06/08/2020 9:44 AM

## 2020-06-16 NOTE — PMR Pre-admission (Signed)
PMR Admission Coordinator Pre-Admission Assessment  Patient: Cynthia Hansen is an 80 y.o., female MRN: [id number] DOB: 11-May-1941 Height: '5\' 8"'  (172.7 cm) Weight: 81.1 kg  Insurance Information HMO:     PPO: yes     PCP:      IPA:      80/20:      OTHER:  PRIMARY: [employer]      Policy#: [id number]      Subscriber: pt CM Name: Tammy      Phone#: [phone]     Fax#: Epic access Pre-Cert#: [id number] approved for 7 days      Employer:  Benefits:  Phone #: [phone]     Name: 1/10 Eff. Date: 06/05/2020     Deduct: none      Out of Pocket Max: $3400      Life Max: none CIR: $325 co pay per day days 1 until 6      SNF: no copay days 1 until 20; $184 co pay per day days 21 until 100 Outpatient: $30 per visit     Co-Pay: visits per medical neccesity Home Health: 100%      Co-Pay: visits per medical neccesity DME: 50%     Co-Pay: 50% Providers: in network  SECONDARY: none  Financial Counselor:       Phone#:   [employer]" for patients in Inpatient Rehabilitation Facilities with attached "Privacy Act [place] Records" was provided and verbally reviewed with: Patient  Emergency Contact Information Contact Information    Name Relation Home Work Mobile   [place] Daughter   [phone]   [place], [place] Sister   [phone]   Sherald Barge   [phone]      Current Medical History  Patient Admitting Diagnosis: Debility s/p ICD placement  History of Present Illness: 80 year old female with history of A fib- on coumadin, T2DM, ulcerative colitis, tobacco use, recent diagnosis of systolic CHF with difficulty managing on outpatient basis. She was admitted on 06/02/20 with SOB , fluid overload with marked LLE edema and acute on chronic renal failure with hyperkalemia. She was started on IV lasix with plans for cardiac cath to work up cardiomyopathy with EF 35-40%. Hospital course significant for code blue 01/01 due to pulseless VT  requiring CPR/shock with ROSC after 4 minutes as well as question of aspiration event. She was started on ceftriaxone for treatment and underwent right thoracocentesis of clear yellow 700 cc on 01/04. Nephrology consulted for input on acute on chronic renal failure with hyponateremia and felt that AKI likely multifactorial due to Frarxiga/hypotension/VT. Dr. Hollie Salk recommended increasing midodrine for BP support and renal ultrasound done revealing moderate right renal parenchyma atrophy and atropic/poorly visualized left kidney. Iron deficiency anemia supplemented with feraheme X 1. SCR has stabilized to 2.9 range and felt to be her new baseline. Daily BMET recommended.  Dr. Lovena Le consulted for input on VT and recommended ICD which was placed on 01/09.    Patient's medical record from [facility] has been reviewed by the rehabilitation admission coordinator and physician.  Past Medical History  Past Medical History:  Diagnosis Date  . A-fib ([place])   . Acute on chronic combined systolic (congestive) and diastolic (congestive) heart failure ([place])   . Carotid artery occlusion   . DCM (dilated cardiomyopathy) ([place])    EF 30-35% by echo 05/2020  . Diabetes mellitus   . Hyperlipidemia   . Irregular heart beat   . Vitamin D deficiency  Family History   family history includes Cancer in her mother; Heart attack in her father; Hypertension in her sister.  Prior Rehab/Hospitalizations Has the patient had prior rehab or hospitalizations prior to admission? Yes  Has the patient had major surgery during 100 days prior to admission? Yes   Current Medications  Current Facility-Administered Medications:  .  0.9 %  sodium chloride infusion, 250 mL, Intravenous, PRN, Evans Lance, MD, Last Rate: 10 mL/hr at 06/13/20 0348, Rate Verify at 06/13/20 0348 .  0.9 %  sodium chloride infusion, 250 mL, Intravenous, Continuous, Evans Lance, MD .  acetaminophen (TYLENOL) tablet 1,000 mg, 1,000  mg, Oral, TID, Evans Lance, MD, 1,000 mg at 06/18/2020 0755 .  acetaminophen (TYLENOL) tablet 325-650 mg, 325-650 mg, Oral, Q4H PRN, Evans Lance, MD, 650 mg at 06/14/20 0715 .  amiodarone (PACERONE) tablet 200 mg, 200 mg, Oral, BID, Evans Lance, MD, 200 mg at 06/05/2020 0757 .  aspirin chewable tablet 81 mg, 81 mg, Oral, Daily, Evans Lance, MD, 81 mg at 06/22/2020 0757 .  atorvastatin (LIPITOR) tablet 80 mg, 80 mg, Oral, Daily, Evans Lance, MD, 80 mg at 06/12/2020 0756 .  colestipol (COLESTID) tablet 1 g, 1 g, Oral, BID, Evans Lance, MD, 1 g at 06/24/2020 0756 .  ferumoxytol (FERAHEME) 510 mg in sodium chloride 0.9 % 100 mL IVPB, 510 mg, Intravenous, Weekly, Evans Lance, MD, Stopped at 06/10/20 1310 .  hydrocortisone cream 1 %, , Topical, QID, Evans Lance, MD, 1 application at 22/48/25 2246 .  insulin aspart (novoLOG) injection 0-5 Units, 0-5 Units, Subcutaneous, QHS, Evans Lance, MD, 2 Units at 06/12/20 2245 .  insulin aspart (novoLOG) injection 0-9 Units, 0-9 Units, Subcutaneous, TID WC, Evans Lance, MD, 2 Units at 06/24/2020 [phone] .  lidocaine (LIDODERM) 5 % 2 patch, 2 patch, Transdermal, Q24H, Evans Lance, MD, 2 patch at 06/09/2020 1139 .  melatonin tablet 3 mg, 3 mg, Oral, QHS, Evans Lance, MD, 3 mg at 06/15/20 2107 .  mesalamine (LIALDA) EC tablet 4.8 g, 4.8 g, Oral, Q breakfast, Evans Lance, MD, 4.8 g at 06/14/20 0850 .  methimazole (TAPAZOLE) tablet 5 mg, 5 mg, Oral, Daily, Evans Lance, MD, 5 mg at 06/26/2020 0755 .  midodrine (PROAMATINE) tablet 10 mg, 10 mg, Oral, TID WC, Simmons, Brittainy M, PA-C, 10 mg at 06/10/2020 1140 .  ondansetron (ZOFRAN) injection 4 mg, 4 mg, Intravenous, Once, Evans Lance, MD .  ondansetron [facility]) injection 4 mg, 4 mg, Intravenous, Q6H PRN, Evans Lance, MD, 4 mg at 07/04/2020 0751 .  polyethylene glycol (MIRALAX / GLYCOLAX) packet 17 g, 17 g, Oral, Daily PRN, Evans Lance, MD .  sodium chloride flush (NS) 0.9 %  injection 3 mL, 3 mL, Intravenous, Q12H, Evans Lance, MD, 3 mL at 06/15/20 2110 .  sodium chloride flush (NS) 0.9 % injection 3 mL, 3 mL, Intravenous, PRN, Evans Lance, MD .  sodium chloride flush (NS) 0.9 % injection 3 mL, 3 mL, Intravenous, Q12H, Evans Lance, MD, 3 mL at 06/15/20 2110 .  torsemide (DEMADEX) tablet 20 mg, 20 mg, Oral, Daily, Larey Dresser, MD, 20 mg at 06/15/2020 0755 .  [START ON 06/17/2020] torsemide (DEMADEX) tablet 40 mg, 40 mg, Oral, Daily, Simmons, Brittainy M, PA-C .  traMADol (ULTRAM) tablet 50 mg, 50 mg, Oral, Q12H PRN, Evans Lance, MD, 50 mg at 06/15/20 2110 .  Warfarin -  Pharmacist Dosing Inpatient, , Does not apply, q1600, [facility], Given at 06/15/20 1825  Patients Current Diet:  Diet Order            Diet Heart Room service appropriate? Yes; Fluid consistency: Thin  Diet effective now                 Precautions / Restrictions Precautions Precautions: Fall,Other (comment),ICD/Pacemaker Precaution Comments: watch HR, chronic BLE numbness, ICD precautions ( placement 1/9) Restrictions Weight Bearing Restrictions: No   Has the patient had 2 or more falls or a fall with injury in the past year? No  Prior Activity Level Limited Community (1-2x/wk): decline in fucntion just in few weeks pta; was living alone and driving  Prior Functional Level Self Care: Did the patient need help bathing, dressing, using the toilet or eating? Independent  Indoor Mobility: Did the patient need assistance with walking from room to room (with or without device)? Independent  Stairs: Did the patient need assistance with internal or external stairs (with or without device)? Independent  Functional Cognition: Did the patient need help planning regular tasks such as shopping or remembering to take medications? Independent  Home Assistive Devices / Equipment Home Assistive Devices/Equipment: None Home Equipment: Shower seat,Cane - single  point,Walker - 4 wheels,Hand held shower head,Grab bars - tub/shower  Prior Device Use: Indicate devices/aids used by the patient prior to current illness, exacerbation or injury? None of the above  Current Functional Level Cognition  Overall Cognitive Status: Within Functional Limits for tasks assessed Orientation Level: Oriented X4 General Comments: very motivated and asking appropriate questions about rehab    Extremity Assessment (includes Sensation/Coordination)  Upper Extremity Assessment: Generalized weakness  Lower Extremity Assessment: Defer to PT evaluation    ADLs  Overall ADL's : Needs assistance/impaired Eating/Feeding: Independent,Sitting Grooming: Min guard,Standing Grooming Details (indicate cue type and reason): stood at sink to locate t.brush and t.paste Upper Body Bathing: Minimal assistance,Sitting Lower Body Bathing: Moderate assistance,Sit to/from stand Upper Body Dressing : Minimal assistance,Sitting Upper Body Dressing Details (indicate cue type and reason): to don gown as back side cover Lower Body Dressing: Maximal assistance,Sitting/lateral leans Lower Body Dressing Details (indicate cue type and reason): to adjust socks from EOB Toilet Transfer: Minimal assistance,RW,Ambulation Toilet Transfer Details (indicate cue type and reason): simulated via functional mobility ~ 77 Ft with Rw and MIN A Toileting- Clothing Manipulation and Hygiene: Min guard,Sit to/from stand Functional mobility during ADLs: Minimal assistance,Rolling walker General ADL Comments: pt continues to present with decreased activity tolerance, generalized weakness and tachycardia with minimal exertion    Mobility  Overal bed mobility: Needs Assistance Bed Mobility: Supine to Sit,Sit to Supine Supine to sit: HOB elevated,Min assist Sit to supine: Min assist General bed mobility comments: exiting bed to pts R side so pt could use RUE to elevate trunk into sitting, light MIN A to elevate  trunk ( pt reports she exits bed to L side at home), cues for technique in order to maintain ICD precautions. MIN A to return to supine needs assist to elevate BLEs d/t fatigue    Transfers  Overall transfer level: Needs assistance Equipment used: Rolling walker (2 wheeled) Transfers: Sit to/from Stand Sit to Stand: Min assist Stand pivot transfers: Min guard General transfer comment: MIN A to power into standing and for inital steadying assist as pt reports mild dizziness upon initial stand    Ambulation / Gait / Stairs / Wheelchair Mobility  Ambulation/Gait Ambulation/Gait assistance: Database administrator  Distance (Feet): 70 Feet (+ 15' x2) Assistive device: Rolling walker (2 wheeled) Gait Pattern/deviations: Step-through pattern,Drifts right/left,Narrow base of support General Gait Details: Slow, mildly unsteady gait with narrow bOS and drifting noted; difficulty with turns needing Min A for support. HR up to 120s with an episode of vtach, which resolved. Gait velocity: decreased Gait velocity interpretation: <1.8 ft/sec, indicate of risk for recurrent falls    Posture / Balance Dynamic Sitting Balance Sitting balance - Comments: pt able to statically sit EOB with minguard Balance Overall balance assessment: Needs assistance Sitting-balance support: Feet supported,Single extremity supported Sitting balance-Leahy Scale: Fair Sitting balance - Comments: pt able to statically sit EOB with minguard Standing balance support: During functional activity Standing balance-Leahy Scale: Poor Standing balance comment: Requires UE support in standing.    Special needs/care consideration    ICD placement 06/13/2020   Previous Home Environment  Living Arrangements: Alone  Lives With: Alone Available Help at Discharge: Family,Available PRN/intermittently Type of Home: Other(Comment) Home Layout: Two level Alternate Level Stairs-Number of Steps: has a chair lift that she can ride up inside Home  Access: Stairs to enter Entrance Stairs-Rails: Right Entrance Stairs-Number of Steps: 3-4 tall steps with R ascending rail- steps are very tall and she has had trouble with this for awhile Bathroom Shower/Tub: Multimedia programmer: Handicapped height Bathroom Accessibility: Yes How Accessible: Accessible via walker [place]: No Additional Comments: on list for independent condo in [place]  Discharge Living Setting Plans for Discharge Living Setting: Patient's home,Alone Type of Home at Discharge: Other (Comment) (condo) Discharge Home Layout: Two level Alternate Level Stairs-Rails:  (has chair lift) Discharge Home Access: Stairs to enter Entrance Stairs-Rails: Right Entrance Stairs-Number of Steps: 3 to 4 Discharge Bathroom Shower/Tub: Walk-in shower Discharge Bathroom Toilet: Handicapped height Discharge Bathroom Accessibility: Yes How Accessible: Accessible via walker Does the patient have any problems obtaining your medications?: No  Social/Family/Support Systems Patient Roles: Parent Contact Information: daughter, Abigail Butts Anticipated Caregiver: Abigail Butts Anticipated Caregiver's Contact Information: see above Ability/Limitations of Caregiver: Abigail Butts works and can provide intermittent IT trainer Availability: Intermittent Discharge Plan Discussed with Primary Caregiver: Yes Is Caregiver In Agreement with Plan?: Yes Does Caregiver/Family have Issues with Lodging/Transportation while Pt is in Rehab?: No  Goals Patient/Family Goal for Rehab: Mod I with PT and OT Expected length of stay: ELOS 10 to 12 days Pt/Family Agrees to Admission and willing to participate: Yes Program Orientation Provided & Reviewed with Pt/Caregiver Including Roles  & Responsibilities: Yes  Decrease burden of Care through IP rehab admission: n/a  Possible need for SNF placement upon discharge: not anticipated  Patient Condition: I have reviewed medical records from [facility] , spoken with patient and daughter. I met with patient at the bedside for inpatient rehabilitation assessment.  Patient will benefit from ongoing PT and OT, can actively participate in 3 hours of therapy a day 5 days of the week, and can make measurable gains during the admission.  Patient will also benefit from the coordinated team approach during an Inpatient Acute Rehabilitation admission.  The patient will receive intensive therapy as well as Rehabilitation physician, nursing, social worker, and care management interventions.  Due to bladder management, bowel management, safety, skin/wound care, disease management, medication administration, pain management and patient education the patient requires 24 hour a day rehabilitation nursing.  The patient is currently min to mod assist overall with mobility and basic ADLs.  Discharge setting and therapy post discharge at home with home health  is anticipated.  Patient has agreed to participate in the Acute Inpatient Rehabilitation Program and will admit today.  Preadmission Screen Completed By:  Cleatrice Burke, 06/12/2020 11:55 AM ______________________________________________________________________   Discussed status with Dr. Posey Pronto  on  06/18/2020 at  49 and received approval for admission today.  Admission Coordinator:  Cleatrice Burke, RN, time  1281 Date  06/15/2020   Assessment/Plan: Diagnosis: Debility  1. Does the need for close, 24 hr/day Medical supervision in concert with the patient's rehab needs make it unreasonable for this patient to be served in a less intensive setting? Yes 2. Co-Morbidities requiring supervision/potential complications: A fib (monitor with increased exertion), T2 DM (Monitor in accordance with exercise and adjust meds as necessary), ulcerative colitis, tobacco use, recent diagnosis of systolic CHF (Monitor in accordance with increased physical activity and avoid UE resistance excercises)   3. Due to safety, skin/wound care, disease management, pain management and patient education, does the patient require 24 hr/day rehab nursing? Yes 4. Does the patient require coordinated care of a physician, rehab nurse, PT, OT to address physical and functional deficits in the context of the above medical diagnosis(es)? Yes Addressing deficits in the following areas: balance, endurance, locomotion, strength, transferring, bathing, dressing, toileting and psychosocial support 5. Can the patient actively participate in an intensive therapy program of at least 3 hrs of therapy 5 days a week? Yes 6. The potential for patient to make measurable gains while on inpatient rehab is excellent 7. Anticipated functional outcomes upon discharge from inpatient rehab: modified independent and supervision PT, modified independent and supervision OT, n/a SLP 8. Estimated rehab length of stay to reach the above functional goals is: 10-14 days. 9. Anticipated discharge destination: Home 10. Overall Rehab/Functional Prognosis: good   MD Signature: Delice Lesch, MD, ABPMR

## 2020-06-16 NOTE — H&P (Addendum)
Physical Medicine and Rehabilitation Admission H&P    CC: Debility.   HPI: Cynthia Hansen. Cynthia Hansen is a 80 year old female with history of A fib- on coumadin, T2DM, ulcerative colitis (Dr. Watt Climes),  DDD- neck/back (Dr. Nelva Bush), tobacco use, recent diagnosis of systolic CHF with difficulty managing on outpatient basis.  History taken from chart review and daughter.  She was admitted on 06/02/2020 with shortness of breath, fluid overload with marked LLE edema and acute on chronic renal failure with hyperkalemia.  She was started on IV lasix with plans for cardiac cath to work up cardiomyopathy with ejection fraction of 35-40%.  Hospital course significant for code blue on 06/05/2020 due to pulseless ventricular tachycardia, requiring CPR/shock with ROSC after 4 minutes as well as question of aspiration event. She was started on ceftriaxone for treatment and underwent right thoracocentesis of clear yellow 700 cc on 06/08/2020.  Nephrology was consulted for input regarding acute on chronic renal failure with hyponateremia and felt that AKI likely multifactorial, due to Frarxiga/hypotension/VT. Dr. Hollie Salk recommended increasing midodrine for BP support and renal ultrasound done revealing moderate right renal parenchyma atrophy and atropic/poorly visualized left kidney. Iron deficiency anemia supplemented with ferriheme X 1.  Dr. Lovena Le consulted for input on VT and recommended ICD which was placed on 06/13/2020 once stable medically. Nephrology consulted due to  ICD placed on 01/03. SCr has stabilized to 2.7 range and she was cleared to resume diuretics as weight on upward trend. Therapy ongoing and patient continues to be limited by weakness and hypotension, poor posture, as well as DOE/tachycardia with activity. CIR recommended due to functional decline.  Please agree admission assessment from earlier today as well.  Per daughter: patient has had DOE--used to get SOB walking to mail box for a couple of years, however  this has improved since PTA. "Socially" active but sedentary (used to sit a lot).   Review of Systems  Constitutional: Positive for malaise/fatigue. Negative for chills and fever.  HENT: Negative for hearing loss and tinnitus.   Eyes: Negative for blurred vision and double vision.  Respiratory: Positive for shortness of breath. Negative for cough.   Cardiovascular: Positive for chest pain (chest wall pain).  Gastrointestinal: Positive for constipation (only two since admission), heartburn, nausea and vomiting.       Wears mini pads due to bowel urgency/inconctinence   Genitourinary: Negative for dysuria and urgency.  Musculoskeletal: Positive for back pain and neck pain. Negative for myalgias.  Skin: Negative for rash.  Neurological: Positive for dizziness, sensory change (numbness from knees down and left arm from shoulder to wrist post Covid booster. ) and weakness. Negative for headaches.  Psychiatric/Behavioral: The patient is not nervous/anxious and does not have insomnia.   All other systems reviewed and are negative.    Past Medical History:  Diagnosis Date  . A-fib ([place])   . Acute on chronic combined systolic (congestive) and diastolic (congestive) heart failure ([place])   . Carotid artery occlusion   . DCM (dilated cardiomyopathy) ([place])    EF 30-35% by echo 05/2020  . Diabetes mellitus   . Hyperlipidemia   . Irregular heart beat   . Vitamin D deficiency     Past Surgical History:  Procedure Laterality Date  . ABDOMINAL HERNIA REPAIR    . CAROTID ENDARTERECTOMY Left Dec. 3, 2008    cea  . CATARACT EXTRACTION Left   . HYSTEROSCOPY WITH D & C N/A 07/30/2014   Procedure: DILATATION AND CURETTAGE /HYSTEROSCOPY;  Surgeon:  Osborne Oman, MD;  Location: [facility];  Service: Gynecology;  Laterality: N/A;  . ICD IMPLANT N/A 06/13/2020   Procedure: ICD IMPLANT;  Surgeon: Evans Lance, MD;  Location: [facility];  Service: Cardiovascular;  Laterality: N/A;  . IR  THORACENTESIS ASP PLEURAL SPACE W/IMG GUIDE  06/08/2020  . IUD REMOVAL N/A 07/30/2014   Procedure: INTRAUTERINE DEVICE (IUD) REMOVAL;  Surgeon: Osborne Oman, MD;  Location: [facility];  Service: Gynecology;  Laterality: N/A;  . RIGHT HEART CATH N/A 06/07/2020   Procedure: RIGHT HEART CATH;  Surgeon: Larey Dresser, MD;  Location: [facility];  Service: Cardiovascular;  Laterality: N/A;    Family History  Problem Relation Age of Onset  . Cancer Mother        Uterin  . Heart attack Father   . Hypertension Sister     Social History:  Was independent and socially active but sedentary. Has been weaker since Thanksgiving. Reports that she quit smoking about 3 years ago. Her smoking use included cigarettes. She quit after 50.00 years of use. She has never used smokeless tobacco. She reports current alcohol use. She reports that she does not use drugs. Allergies:    Allergies  Allergen Reactions  . Avandia [Rosiglitazone] Other (See Comments)    Causes muscle weakness.  . Latex Rash  . Sulfa Antibiotics Rash    Medications Prior to Admission  Medication Sig Dispense Refill  . acetaminophen (TYLENOL) 500 MG tablet Take 1,000 mg by mouth every 6 (six) hours as needed for mild pain (For back pain.).    Marland Kitchen atorvastatin (LIPITOR) 20 MG tablet TAKE 1 TABLET(20 MG) BY MOUTH DAILY (Patient taking differently: Take 20 mg by mouth daily.) 90 tablet 2  . colestipol (COLESTID) 1 g tablet Take 1 g by mouth 2 (two) times daily.    Marland Kitchen diltiazem (CARDIZEM CD) 180 MG 24 hr capsule TAKE 1 CAPSULE BY MOUTH TWICE DAILY BEFORE MEALS (Patient taking differently: Take 180 mg by mouth in the morning and at bedtime.) 180 capsule 3  . diphenoxylate-atropine (LOMOTIL) 2.5-0.025 MG tablet Take 1 tablet by mouth 2 (two) times daily as needed.    . furosemide (LASIX) 40 MG tablet Take 1 tablet (40 mg total) by mouth 2 (two) times daily. 180 tablet 3  . glimepiride (AMARYL) 2 MG tablet Take 2 mg by mouth daily with  breakfast.  1  . Liniments (SALONPAS ARTHRITIS PAIN RELIEF EX) Apply 1 application topically daily as needed (For lower back pain).    Marland Kitchen lisinopril (PRINIVIL,ZESTRIL) 5 MG tablet Take 2.5 mg by mouth daily.    . Vitamin D, Ergocalciferol, (DRISDOL) 1.25 MG (50000 UNIT) CAPS capsule Take 50,000 Units by mouth once a week.    . warfarin (COUMADIN) 1 MG tablet Take 1 tablet to 1 and 1/2 tablets by mouth daily as directed by the coumadin clinic. (Patient taking differently: Take 1-1.5 mg by mouth See admin instructions. Take 1 tablet by mouth every day except on Tuesdays take  1 and 1/2 tablets by mouth daily as directed by the coumadin clinic.) 40 tablet 0  . ONE TOUCH ULTRA TEST test strip 1 each by Other route as needed (for BG testing).   5    Drug Regimen Review  Drug regimen was reviewed and remains appropriate with no significant issues identified  Home: Home Living Family/patient expects to be discharged to:: Private residence Living Arrangements: Alone Available Help at Discharge: Family,Available PRN/intermittently Type of Home:  Other(Comment) (2 story condo) Home Access: Stairs to enter [employer] of Steps: 3-4 tall steps with R ascending rail- steps are very tall and she has had trouble with this for awhile Entrance Stairs-Rails: Right Home Layout: Two level Alternate Level Stairs-Number of Steps: has a chair lift that she can ride up inside [employer] Shower/Tub: Multimedia programmer: Handicapped height Home Equipment: Shower seat,Cane - single point,Walker - 4 wheels,Hand held shower head,Grab bars - tub/shower Additional Comments: on list for independent condo in [place]   Functional History: Prior Function Level of Independence: Independent Comments: has DME from her late husband but does not really use it  Functional Status:  Mobility: Bed Mobility Overal bed mobility: Needs Assistance Bed Mobility: Supine to Sit,Sit to Supine Supine to sit:  HOB elevated,Min assist Sit to supine: Min assist General bed mobility comments: exiting bed to pts R side so pt could use RUE to elevate trunk into sitting, light MIN A to elevate trunk ( pt reports she exits bed to L side at home), cues for technique in order to maintain ICD precautions. MIN A to return to supine needs assist to elevate BLEs d/t fatigue Transfers Overall transfer level: Needs assistance Equipment used: Rolling walker (2 wheeled) Transfers: Sit to/from Stand Sit to Stand: Min assist Stand pivot transfers: Min guard General transfer comment: MIN A to power into standing and for inital steadying assist as pt reports mild dizziness upon initial stand Ambulation/Gait Ambulation/Gait assistance: Min assist Gait Distance (Feet): 70 Feet (+ 15' x2) Assistive device: Rolling walker (2 wheeled) Gait Pattern/deviations: Step-through pattern,Drifts right/left,Narrow base of support General Gait Details: Slow, mildly unsteady gait with narrow bOS and drifting noted; difficulty with turns needing Min A for support. HR up to 120s with an episode of vtach, which resolved. Gait velocity: decreased Gait velocity interpretation: <1.8 ft/sec, indicate of risk for recurrent falls    ADL: ADL Overall ADL's : Needs assistance/impaired Eating/Feeding: Independent,Sitting Grooming: Min guard,Standing Grooming Details (indicate cue type and reason): stood at sink to locate t.brush and t.paste Upper Body Bathing: Minimal assistance,Sitting Lower Body Bathing: Moderate assistance,Sit to/from stand Upper Body Dressing : Minimal assistance,Sitting Upper Body Dressing Details (indicate cue type and reason): to don gown as back side cover Lower Body Dressing: Maximal assistance,Sitting/lateral leans Lower Body Dressing Details (indicate cue type and reason): to adjust socks from EOB Toilet Transfer: Minimal assistance,RW,Ambulation Toilet Transfer Details (indicate cue type and reason): simulated  via functional mobility ~ 16 Ft with Rw and MIN A Toileting- Clothing Manipulation and Hygiene: Min guard,Sit to/from stand Functional mobility during ADLs: Minimal assistance,Rolling walker General ADL Comments: pt continues to present with decreased activity tolerance, generalized weakness and tachycardia with minimal exertion  Cognition: Cognition Overall Cognitive Status: Within Functional Limits for tasks assessed Orientation Level: Oriented X4 Cognition Arousal/Alertness: Awake/alert Behavior During Therapy: WFL for tasks assessed/performed Overall Cognitive Status: Within Functional Limits for tasks assessed General Comments: very motivated and asking appropriate questions about rehab  Physical Exam: Blood pressure (!) 98/59, pulse 80, temperature (!) 97.5 F (36.4 C), resp. rate 20, height 5\' 8"  (1.727 m), weight 81.1 kg, SpO2 95 %. Physical Exam Vitals reviewed.  Constitutional:      General: She is not in acute distress.    Appearance: She is not ill-appearing.  HENT:     Head: Normocephalic and atraumatic.     Right Ear: External ear normal.     Left Ear: External ear normal.     Nose: Nose  normal.  Eyes:     General:        Right eye: No discharge.        Left eye: No discharge.     Extraocular Movements: Extraocular movements intact.  Cardiovascular:     Comments: Irregularly irregular Pulmonary:     Effort: Pulmonary effort is normal. No respiratory distress.     Breath sounds: No stridor.  Abdominal:     General: Abdomen is flat. Bowel sounds are normal. There is no distension.  Musculoskeletal:     Cervical back: Normal range of motion and neck supple.     Comments: No edema or tenderness in extremities  Skin:    General: Skin is warm.     Comments: Scattered ecchymosis Dressing CDI  Neurological:     Mental Status: She is alert.     Comments: Alert Motor: 4+/5 throughout Sensation diminished to light touch distal to bilateral knees  Psychiatric:         Mood and Affect: Mood normal.        Behavior: Behavior normal.     Results for orders placed or performed during the hospital encounter of 06/02/20 (from the past 48 hour(s))  Glucose, capillary     Status: Abnormal   Collection Time: 06/14/20 11:11 AM  Result Value Ref Range   Glucose-Capillary 295 (H) 70 - 99 mg/dL    Comment: Glucose reference range applies only to samples taken after fasting for at least 8 hours.   Comment 1 Notify RN    Comment 2 Document in Chart   Glucose, capillary     Status: Abnormal   Collection Time: 06/14/20  4:32 PM  Result Value Ref Range   Glucose-Capillary 237 (H) 70 - 99 mg/dL    Comment: Glucose reference range applies only to samples taken after fasting for at least 8 hours.   Comment 1 Notify RN    Comment 2 Document in Chart   Glucose, capillary     Status: Abnormal   Collection Time: 06/14/20  9:26 PM  Result Value Ref Range   Glucose-Capillary 173 (H) 70 - 99 mg/dL    Comment: Glucose reference range applies only to samples taken after fasting for at least 8 hours.  Protime-INR     Status: Abnormal   Collection Time: 06/15/20 12:36 AM  Result Value Ref Range   Prothrombin Time 21.3 (H) 11.4 - 15.2 seconds   INR 1.9 (H) 0.8 - 1.2    Comment: (NOTE) INR goal varies based on device and disease states. Performed at [facility], [place] [address], [place], [place] [address]   CBC     Status: Abnormal   Collection Time: 06/15/20 12:36 AM  Result Value Ref Range   WBC 11.7 (H) 4.0 - 10.5 K/uL   RBC 2.95 (L) 3.87 - 5.11 MIL/uL   Hemoglobin 8.6 (L) 12.0 - 15.0 g/dL   HCT 28.7 (L) 36.0 - 46.0 %   MCV 97.3 80.0 - 100.0 fL   MCH 29.2 26.0 - 34.0 pg   MCHC 30.0 30.0 - 36.0 g/dL   RDW 15.4 11.5 - 15.5 %   Platelets 312 150 - 400 K/uL   nRBC 1.8 (H) 0.0 - 0.2 %    Comment: Performed at [facility], [place] [address], [place], [place] [address]  Magnesium     Status: Abnormal   Collection Time: 06/15/20 12:36 AM  Result Value  Ref Range   Magnesium 2.7 (H) 1.7 -  2.4 mg/dL    Comment: Performed at [facility], [place] [address], [place], [place] [address]  Basic metabolic panel     Status: Abnormal   Collection Time: 06/15/20 12:36 AM  Result Value Ref Range   Sodium 133 (L) 135 - 145 mmol/L   Potassium 4.4 3.5 - 5.1 mmol/L   Chloride 98 98 - 111 mmol/L   CO2 23 22 - 32 mmol/L   Glucose, Bld 215 (H) 70 - 99 mg/dL    Comment: Glucose reference range applies only to samples taken after fasting for at least 8 hours.   BUN 82 (H) 8 - 23 mg/dL   Creatinine, Ser 2.73 (H) 0.44 - 1.00 mg/dL   Calcium 8.6 (L) 8.9 - 10.3 mg/dL   GFR, Estimated 17 (L) >60 mL/min    Comment: (NOTE) Calculated using the CKD-EPI Creatinine Equation (2021)    Anion gap 12 5 - 15    Comment: Performed at [facility] [address], [place], [place] [address]  Glucose, capillary     Status: Abnormal   Collection Time: 06/15/20  6:27 AM  Result Value Ref Range   Glucose-Capillary 159 (H) 70 - 99 mg/dL    Comment: Glucose reference range applies only to samples taken after fasting for at least 8 hours.  Glucose, capillary     Status: Abnormal   Collection Time: 06/15/20 11:33 AM  Result Value Ref Range   Glucose-Capillary 232 (H) 70 - 99 mg/dL    Comment: Glucose reference range applies only to samples taken after fasting for at least 8 hours.  Glucose, capillary     Status: Abnormal   Collection Time: 06/15/20  4:29 PM  Result Value Ref Range   Glucose-Capillary 208 (H) 70 - 99 mg/dL    Comment: Glucose reference range applies only to samples taken after fasting for at least 8 hours.  Glucose, capillary     Status: Abnormal   Collection Time: 06/15/20 10:11 PM  Result Value Ref Range   Glucose-Capillary 201 (H) 70 - 99 mg/dL    Comment: Glucose reference range applies only to samples taken after fasting for at least 8 hours.   Comment 1 Notify RN    Comment 2 Document in Chart   Protime-INR     Status: Abnormal    Collection Time: 06/15/2020 12:39 AM  Result Value Ref Range   Prothrombin Time 20.9 (H) 11.4 - 15.2 seconds   INR 1.9 (H) 0.8 - 1.2    Comment: (NOTE) INR goal varies based on device and disease states. Performed at [facility], [place] [address], [place], [place] [address]   Magnesium     Status: Abnormal   Collection Time: 06/23/2020 12:39 AM  Result Value Ref Range   Magnesium 2.6 (H) 1.7 - 2.4 mg/dL    Comment: Performed at [facility] [address], [place], [place] [address]  Basic metabolic panel     Status: Abnormal   Collection Time: 06/23/2020 12:39 AM  Result Value Ref Range   Sodium 132 (L) 135 - 145 mmol/L   Potassium 4.4 3.5 - 5.1 mmol/L   Chloride 98 98 - 111 mmol/L   CO2 21 (L) 22 - 32 mmol/L   Glucose, Bld 232 (H) 70 - 99 mg/dL    Comment: Glucose reference range applies only to samples taken after fasting for at least 8 hours.   BUN 89 (H) 8 - 23 mg/dL   Creatinine,  Ser 2.88 (H) 0.44 - 1.00 mg/dL   Calcium 8.4 (L) 8.9 - 10.3 mg/dL   GFR, Estimated 16 (L) >60 mL/min    Comment: (NOTE) Calculated using the CKD-EPI Creatinine Equation (2021)    Anion gap 13 5 - 15    Comment: Performed at [facility] [address], [place], [place] [address]  Glucose, capillary     Status: Abnormal   Collection Time: 06/20/2020  6:17 AM  Result Value Ref Range   Glucose-Capillary 183 (H) 70 - 99 mg/dL    Comment: Glucose reference range applies only to samples taken after fasting for at least 8 hours.   Comment 1 Notify RN    Comment 2 Document in Chart    No results found.     Medical Problem List and Plan: 1.  Limited by weakness and hypotension, poor posture, as well as DOE/tachycardia with activity secondary to cardiac debility.  -patient may shower with incision covered  -ELOS/Goals: 3-5 days/supervision/mod I  Admit to CIR 2.  Antithrombotics: -DVT/anticoagulation:  Pharmaceutical: Coumadin  -antiplatelet therapy: N/A 3. Neuropathy/Pain  Management: Ultram prn for LUE pain  -- tylenol 1000 mg tid-->decrease to 650 mg tid.   Monitor with increased exertion 4. Mood: LCSW to follow for evaluation and support.   -antipsychotic agents: N/A 5. Neuropsych: This patient is capable of making decisions on her own behalf. 6. Skin/Wound Care: Routine pressure relief measures 7. Fluids/Electrolytes/Nutrition: Strict I/Os.   Daily CBC per cardiology 8. Acute on chronic systolic CHF: Strict I/O.   Torsemide resumed 1/10 --received extra 20 mg today .    On Lipitor, Colestid, Demadex BB due to soft BP/ no ace due to to CKD.  Daily weights 9. Acute on chronic Stage IV: Continue midodrine tid-->increased 11/12 to allow for adequate perfusion.   SCr up to 2.88 on 1/12  Daily BMP 10. Hyponatremia: Monitor with daily checks.   Sodium 132 on 1/12 11. Leucocytosis: Monitor for signs of infection and for fevers.   CBC ordered for tomorrow 12. A fib: Monitor HR tid. On Pacerone 200 mg bid and coumadin.  13. Aspiration PNA: Completed 10 day course of ceftriaxone on 1/10 14. Chest wall pain: Continue Lidoderm patches with tramadol prn.  15. VT arrest s/p ICD 1/09: On pacer precautions.  16. T2DM: Monitor BS ac/hs. Blood sugars trending upwards-was on glimepiride at home but intake has been poor.  Continue SSI for now.   Monitor with increased mobility 17. Ulcerative colitis: On mesalamine--has been refusing Rx-->daughter to bring her med from home as this is better tolerated.  18. Constipation:  Has not had BM since VT per family--added miralax for now and d/c once mesalamine on board (as had diarrhea SE)    Just bowel meds as necessary 19. Anemia of chronic disease:  Repeat fareheme today. Monitor H/H with serial checks.    CBC ordered for tomorrow 20.  Nausea and vomiting  Zofran as needed  See #18   Bary Leriche, PA-C 06/29/2020  I have personally performed a face to face diagnostic evaluation, including, but not limited to relevant  history and physical exam findings, of this patient and developed relevant assessment and plan.  Additionally, I have reviewed and concur with the physician assistant's documentation above.  Delice Lesch, MD, ABPMR

## 2020-06-16 NOTE — IPOC Note (Signed)
Individualized overall Plan of Care (IPOC) Patient Details Name: Cynthia Hansen MRN: [id number] DOB: 11/26/1940  Admitting Diagnosis: [facility] Problems: Principal Problem:   Debility Active Problems:   Hypoalbuminemia due to protein-calorie malnutrition ([place])   Controlled type 2 diabetes mellitus with hyperglycemia, without long-term current use of insulin (HCC)   Non-intractable vomiting   Chronic kidney disease (CKD), stage IV (severe) (HCC)   Acute on chronic systolic (congestive) heart failure (HCC)     Functional Problem List: Nursing Pain,Safety,Skin Integrity,Nutrition  PT Balance,Edema,Endurance,Motor,Nutrition,Pain,Sensory,Skin Integrity  OT Balance,Endurance,Pain,Safety,Sensory  SLP    TR         Basic ADL's: OT Grooming,Bathing,Dressing,Toileting     Advanced  ADL's: OT Simple Meal Preparation,Light Housekeeping     Transfers: PT Bed Mobility,Bed to [employer]  OT Toilet,Tub/Shower     Locomotion: PT Ambulation,Stairs     Additional Impairments: OT    SLP        TR      Anticipated Outcomes Item Anticipated Outcome  Self Feeding independent  Swallowing      Basic self-care  supervision/mod I  Toileting  supervision   Bathroom Transfers supervision  Bowel/Bladder  Pain will remain continent of bowel and bladder until discharge  Transfers  supervision with LRAD  Locomotion  supervision with LRAD  Communication     Cognition     Pain  Patient will have a pain level of 2/10 on pain scale  Safety/Judgment  Patient will remain free of falls until discharge   Therapy Plan: PT Intensity: Minimum of 1-2 x/day ,45 to 90 minutes PT Frequency: 5 out of 7 days PT Duration Estimated Length of Stay: 7-10days OT Intensity: Minimum of 1-2 x/day, 45 to 90 minutes OT Frequency: 5 out of 7 days OT Duration/Estimated Length of Stay: 7-10 days      Team Interventions: Nursing Interventions Patient/Family Education,Pain Management,Skin  Care/Wound Health visitor  PT interventions Balance/vestibular training,Community reintegration,Disease management/prevention,Ambulation/gait training,Cognitive remediation/compensation,Discharge planning,DME/adaptive equipment instruction,Functional mobility training,Neuromuscular re-education,Pain management,Psychosocial support,Stair training,Skin care/wound management,Patient/family education,Splinting/orthotics,Therapeutic Activities,Therapeutic Exercise,UE/LE Coordination activities,UE/LE Strength taining/ROM,Wheelchair propulsion/positioning,Visual/perceptual remediation/compensation  OT Interventions Balance/vestibular training,Self Care/advanced ADL retraining,Therapeutic Electronics engineer instruction,Pain management,UE/LE Strength taining/ROM,Patient/family education,Discharge planning,Functional mobility training,Therapeutic Activities  SLP Interventions    TR Interventions    SW/CM Interventions Discharge Planning,Psychosocial Support,Patient/Family Education   Barriers to Discharge MD  Medical stability and Weight  Nursing Home environment access/layout,Nutrition means    PT Inaccessible home environment,Decreased caregiver support,Home environment access/layout,Lack of/limited family support,Insurance for SNF coverage    OT      SLP      SW Decreased caregiver support Needs to be mod/i by discharge does not have 24/7 care   Team Discharge Planning: Destination: PT-Home ,OT- Home , SLP-  Projected Follow-up: PT-Home health PT, OT-  Home health OT, SLP-  Projected Equipment Needs: PT-To be determined, OT- 3 in 1 bedside comode,Tub/shower bench, SLP-  Equipment Details: PT-Pt owns DME from husband., OT-  Patient/family involved in discharge planning: PT- Patient,  OT-Patient, SLP-   MD ELOS: 6-9 days. Medical Rehab Prognosis:  Good Assessment: 80 year old female with history of A fib- on coumadin, T2DM, ulcerative colitis (Dr. Watt Climes),  DDD-  neck/back (Dr. Nelva Bush), tobacco use, recent diagnosis of systolic CHF with difficulty managing on outpatient basis.  She was admitted on 06/02/2020 with shortness of breath, fluid overload with marked LLE edema and acute on chronic renal failure with hyperkalemia.  She was started on IV lasix with plans for cardiac cath to work up  cardiomyopathy with ejection fraction of 35-40%.  Hospital course significant for code blue on 06/05/2020 due to pulseless ventricular tachycardia, requiring CPR/shock with ROSC after 4 minutes as well as question of aspiration event. She was started on ceftriaxone for treatment and underwent right thoracocentesis of clear yellow 700 cc on 06/09/2019.Nephrology was consulted for input regarding acute on chronic renal failure with hyponateremia and felt that AKI likely multifactorial, due to Frarxiga/hypotension/VT. Dr. Hollie Salk recommended increasing midodrine for BP support and renal ultrasound done revealing moderate right renal parenchyma atrophy and atropic/poorly visualized left kidney. Iron deficiency anemia supplemented with ferriheme X 1.  Dr. Lovena Le consulted for input on VT and recommended ICD which was placed on 06/13/2020 once stable medically. Nephrology consulted due to  ICD placed on 01/03. SCr has stabilized to 2.7 range and she was cleared to resume diuretics as weight on upward trend. Therapy ongoing and patient continues to be limited by weakness and hypotension, poor posture, as well as DOE/tachycardia with activity. Per daughter: patient has had DOE--used to get SOB walking to mail box for a couple of years, however this has improved since PTA. "Socially" active but sedentary (used to sit a lot).  We will set goals for Supervision with PT/OT.  Due to the current state of emergency, patients may not be receiving their 3-hours of Medicare-mandated therapy.  See Team Conference Notes for weekly updates to the plan of care

## 2020-06-16 NOTE — Plan of Care (Signed)

## 2020-06-16 NOTE — Progress Notes (Signed)
Physical Therapy Treatment Patient Details Name: Cynthia Hansen MRN: [id number] DOB: Jul 15, 1940 Today's Date: 06/17/2020    History of Present Illness 80yo female admitted with acute systolic CHF. PMH A-fib, acute on chronic heart failure, DCM wtih 30-35% ejection fraction, DM, HLD, 06/05/20 Cardiac arrest with 4 min CPR, Rt lung thorocentesis 06/08/2020, s/p ICD placement 06/13/20.    PT Comments    Pt supine in bed.  Focused on LE strengthening and progression of gt training.  Pt has a bed and will d/c to CIR today.  She is progression well required min assistance for mobility and Min guard for gt and transfers.   Follow Up Recommendations  CIR     Equipment Recommendations  3in1 (PT);Rolling walker with 5" wheels    Recommendations for Other Services       Precautions / Restrictions Precautions Precautions: Fall;Other (comment);ICD/Pacemaker Precaution Comments: watch HR, chronic BLE numbness, ICD precautions ( placement 1/9) Restrictions Weight Bearing Restrictions: No    Mobility  Bed Mobility Overal bed mobility: Needs Assistance Bed Mobility: Supine to Sit;Sit to Supine     Supine to sit: HOB elevated;Min assist Sit to supine: Supervision   General bed mobility comments: Pt able to mobilize to edge of bed with min assistance for trunk elevation. Pt able to move back to bed unsupported.  Transfers Overall transfer level: Needs assistance Equipment used: 4-wheeled walker Transfers: Sit to/from Stand Sit to Stand: Min guard         General transfer comment: Min guard from elevated seated surface.  Ambulation/Gait Ambulation/Gait assistance: Min guard Gait Distance (Feet): 120 Feet Assistive device: Rolling walker (2 wheeled) Gait Pattern/deviations: Step-through pattern;Drifts right/left;Narrow base of support;Trunk flexed Gait velocity: decreased   General Gait Details: Cues for trunk and head control.  Pt with mild DOE but SPo2 100% on RA.  HR elevated to  114 bpm.   Stairs             Wheelchair Mobility    Modified Rankin (Stroke Patients Only)       Balance Overall balance assessment: Needs assistance Sitting-balance support: Feet supported;Single extremity supported Sitting balance-Leahy Scale: Fair Sitting balance - Comments: pt able to statically sit EOB with minguard     Standing balance-Leahy Scale: Poor Standing balance comment: Requires UE support in standing.                            Cognition Arousal/Alertness: Awake/alert Behavior During Therapy: WFL for tasks assessed/performed Overall Cognitive Status: Within Functional Limits for tasks assessed                                        Exercises General Exercises - Lower Extremity Ankle Circles/Pumps: Both;10 reps;Supine;AROM Quad Sets: AROM;Both;10 reps;Supine Heel Slides: AROM;Both;10 reps;Supine Hip ABduction/ADduction: AROM;Both;10 reps;Supine Straight Leg Raises: AAROM;Both;10 reps;Supine    General Comments        Pertinent Vitals/Pain Pain Assessment: Faces Faces Pain Scale: Hurts little more Pain Location: neck and low back Pain Descriptors / Indicators: Aching;Sore;Discomfort;Grimacing Pain Intervention(s): Monitored during session;Repositioned    Home Living                      Prior Function            PT Goals (current goals can now be found in the care plan section) Acute  Rehab PT Goals Potential to Achieve Goals: Good Progress towards PT goals: Progressing toward goals    Frequency    Min 3X/week      PT Plan Current plan remains appropriate    Co-evaluation              AM-PAC PT "6 Clicks" Mobility   Outcome Measure  Help needed turning from your back to your side while in a flat bed without using bedrails?: A Little Help needed moving from lying on your back to sitting on the side of a flat bed without using bedrails?: A Little Help needed moving to and from a  bed to a chair (including a wheelchair)?: A Little Help needed standing up from a chair using your arms (e.g., wheelchair or bedside chair)?: A Little Help needed to walk in hospital room?: A Little Help needed climbing 3-5 steps with a railing? : A Little 6 Click Score: 18    End of Session Equipment Utilized During Treatment: Gait belt Activity Tolerance: Patient tolerated treatment well (Pt had bouts of dry heaving post session.)   Nurse Communication: Mobility status PT Visit Diagnosis: Unsteadiness on feet (R26.81);Muscle weakness (generalized) (M62.81);Difficulty in walking, not elsewhere classified (R26.2)     Time: 7615-1834 PT Time Calculation (min) (ACUTE ONLY): 28 min  Charges:  $Gait Training: 8-22 mins $Therapeutic Exercise: 8-22 mins                     Erasmo Leventhal , PTA Acute Rehabilitation Services Pager [phone] Office [phone]     Everlean Bucher Eli Hose 06/20/2020, 12:05 PM

## 2020-06-16 NOTE — Progress Notes (Addendum)
Inpatient Rehabilitation Admissions Coordinator  CIR bed is available to admit today. I notified Dr. Aundra Dubin, acute team and TOC. I met with patient at bedside and spoke with her daughter, Abigail Butts , by phone. I will make the arrangements to admit today. Abigail Butts is pt's POA and I have asked patient to have her daughter to bring in documentation to scan into the chart.  Danne Baxter, RN, MSN Rehab Admissions Coordinator [phone] 06/19/2020 11:18 AM

## 2020-06-16 NOTE — Discharge Summary (Addendum)
Advanced Heart Failure Team  Discharge Summary   Patient ID: Cynthia Hansen MRN: [id number], DOB/AGE: 1941-02-09 80 y.o. Admit date: 06/02/2020 D/C date:     06/11/2020   Primary Discharge Diagnoses:  Acute on Chronic Systolic Heart Failure AKI on CKD, Stage IV Cardiac Arrest/ Pulseless VT S/p St Jude ICD implant  Hyperkalemia Iron Deficiency Anemia CAD: NSTEMI with HS-TnI up to 7990 post-VT arrest and CPR.   Hyperthyroidism Pneumonia  Rt Sided Pleural Effusion s/p thoracentesis  Chronic Atrial Fibrillation Chronic Anticoagulation Therapy w/ Coumadin  Deconditioning    Hospital Course:   80 y/o female w/ chronic systolic heart failure and chronic afib on coumadin. Cardiolite in 11/21 with EF 26%, inferior fixed perfusion defect with no ischemia.  Echo in 12/21 with EF 35-40%, moderate LV dilation, mildly decreased RV function. Cause of cardiomyopathy uncertain but there is concern for ischemic cardiomyopathy.  Worsening CHF recently complicated by CKD stage IV.   She has been struggling with CHF as well as worsening renal failure for a number of weeks now.  Seen in general cardiology clinic 06/02/20 and admitted with hyperkalemia (K 6.4) and volume overload. She was given Lokelma and IV Lasix. Also found to have low TSH on admit. F/u labs suggestive of hyperthyroidism. Endocrinology recommended treatment w/ methimazole.   She diuresed well initially but had pulseless VT arrest on 1/1 requiring shock x 1 and CPR x 4 minutes.  She had rapid recovery and was not intubated.  Electrolytes stable.  Amiodarone gtt started.  Due to low BP, she was started on norepinephrine 4 peripherally.  Creatinine rose 2.1 => 2.36 and HS-TnI rose to 7990. 06/08/19 RHC demonstrated mildly elevated filling pressures and relatively preserved CO. No LHC due to abnormal renal function. cMRI limited study. Very difficult images due to respiratory artifact and atrial fibrillation. 06/09/19. Developed RLL PNA post arrest  treated w/ abx + developed pleural effusion S/P Thoracentesis on Rt. 700 cc removed (transudative). On 1/9, St Jude ICD was placed. She had no further VT.   Nephrology was consulted for AKI. They felt likely ATN 2/2 cardiac arrest + related to CHF and relative hypotension leading to decreased filtration. Midodrine was added for BP support. HF treatmenent limited by low BP/CKD. Antihypertensives were discontinued. SCr plateaued ~2.7-2.8. Transition to oral torsemide 40 mg daily.  PT evaluated and recommended CIR. Last seen and examined by Dr. Haroldine Laws on 1/12 and felt medically stable for d/c to CIR. AHF team will continue to follow.     Discharge Weight Range: 178 lb  Discharge Vitals: Blood pressure (!) 98/59, pulse 80, temperature (!) 97.5 F (36.4 C), temperature source Oral, resp. rate 20, height 5\' 8"  (1.727 m), weight 81.1 kg, SpO2 95 %.  Labs: Lab Results  Component Value Date   WBC 11.7 (H) 06/15/2020   HGB 8.6 (L) 06/15/2020   HCT 28.7 (L) 06/15/2020   MCV 97.3 06/15/2020   PLT 312 06/15/2020    Recent Labs  Lab 06/18/2020 0039  NA 132*  K 4.4  CL 98  CO2 21*  BUN 89*  CREATININE 2.88*  CALCIUM 8.4*  GLUCOSE 232*   Lab Results  Component Value Date   CHOL 88 06/04/2020   HDL 47 06/04/2020   LDLCALC 28 06/04/2020   TRIG 67 06/04/2020   BNP (last 3 results) Recent Labs    05/27/20 0828 06/02/20 1832  BNP 2,213.1* 2,338.9*    ProBNP (last 3 results) Recent Labs    05/13/20 1550 05/31/20 4967  PROBNP [id number]* [id number]*     Diagnostic Studies/Procedures   No results found.  Discharge Medications   Allergies as of 06/23/2020      Reactions   Avandia [rosiglitazone] Other (See Comments)   Causes muscle weakness.   Latex Rash   Sulfa Antibiotics Rash      Medication List    STOP taking these medications   diltiazem 180 MG 24 hr capsule Commonly known as: CARDIZEM CD   furosemide 40 MG tablet Commonly known as: LASIX   glimepiride 2 MG  tablet Commonly known as: AMARYL   lisinopril 5 MG tablet Commonly known as: ZESTRIL     TAKE these medications   acetaminophen 500 MG tablet Commonly known as: TYLENOL Take 1,000 mg by mouth every 6 (six) hours as needed for mild pain (For back pain.).   amiodarone 200 MG tablet Commonly known as: PACERONE Take 1 tablet (200 mg total) by mouth 2 (two) times daily.   aspirin 81 MG chewable tablet Chew 1 tablet (81 mg total) by mouth daily.   atorvastatin 80 MG tablet Commonly known as: LIPITOR Take 1 tablet (80 mg total) by mouth daily. What changed:   medication strength  See the new instructions.   colestipol 1 g tablet Commonly known as: COLESTID Take 1 g by mouth 2 (two) times daily.   diphenoxylate-atropine 2.5-0.025 MG tablet Commonly known as: LOMOTIL Take 1 tablet by mouth 2 (two) times daily as needed.   ferumoxytol 510 mg in sodium chloride 0.9 % 100 mL Inject 510 mg into the vein once a week. Start taking on: June 17, 2020   insulin aspart 100 UNIT/ML injection Commonly known as: novoLOG Inject 0-5 Units into the skin at bedtime.   insulin aspart 100 UNIT/ML injection Commonly known as: novoLOG Inject 0-9 Units into the skin 3 (three) times daily with meals.   melatonin 3 MG Tabs tablet Take 1 tablet (3 mg total) by mouth at bedtime.   mesalamine 1.2 g EC tablet Commonly known as: LIALDA Take 4 tablets (4.8 g total) by mouth daily with breakfast. Start taking on: June 17, 2020   methimazole 5 MG tablet Commonly known as: TAPAZOLE Take 1 tablet (5 mg total) by mouth daily.   midodrine 10 MG tablet Commonly known as: PROAMATINE Take 1 tablet (10 mg total) by mouth 3 (three) times daily with meals.   ONE TOUCH ULTRA TEST test strip Generic drug: glucose blood 1 each by Other route as needed (for BG testing).   polyethylene glycol 17 g packet Commonly known as: MIRALAX / GLYCOLAX Take 17 g by mouth daily as needed for moderate  constipation.   SALONPAS ARTHRITIS PAIN RELIEF EX Apply 1 application topically daily as needed (For lower back pain).   torsemide 20 MG tablet Commonly known as: DEMADEX Take 2 tablets (40 mg total) by mouth daily. Start taking on: June 17, 2020   traMADol 50 MG tablet Commonly known as: ULTRAM Take 1 tablet (50 mg total) by mouth every 12 (twelve) hours as needed for moderate pain.   Vitamin D (Ergocalciferol) 1.25 MG (50000 UNIT) Caps capsule Commonly known as: DRISDOL Take 50,000 Units by mouth once a week.   warfarin 1 MG tablet Commonly known as: COUMADIN Take as directed. If you are unsure how to take this medication, talk to your nurse or doctor. Original instructions: Take 1 tablet (1 mg total) by mouth one time only at 4 PM for 1 day. What changed:   how much  to take  how to take this  when to take this  additional instructions       Disposition   The patient will be discharged in stable condition to home. Discharge Instructions    Amb Referral to Cardiac Rehabilitation   Complete by: As directed    Diagnosis:  NSTEMI Heart Failure (see criteria below if ordering Phase II)     Heart Failure Type: Chronic Systolic   After initial evaluation and assessments completed: Virtual Based Care may be provided alone or in conjunction with Phase 2 Cardiac Rehab based on patient barriers.: Yes      Follow-up Information    [facility] Follow up.   Why: on 06/24/20 at 4 pm for post ICD wound check  Contact information: [place] [address] [phone]       Aretta Nip, MD Follow up.   Specialty: Family Medicine Contact information: [place] [address] [phone]        Jettie Booze, MD .   Specialties: Cardiology, Radiology, Interventional Cardiology Contact information: [address]  [address] [phone]                 Duration of Discharge Encounter: Greater than 35 minutes   Signed, Nelida Gores   06/10/2020, 3:59 PM  Patient seen and examined with the above-signed Advanced Practice Provider and/or Housestaff. I personally reviewed laboratory data, imaging studies and relevant notes. I independently examined the patient and formulated the important aspects of the plan. I have edited the note to reflect any of my changes or salient points. I have personally discussed the plan with the patient and/or family.  Much improved today. Able to walk a little. Still very weak. No further VT. Volume status ok. Agree with transfer to CIR. Meds reviewed.   Glori Bickers, MD  5:41 PM

## 2020-06-17 ENCOUNTER — Inpatient Hospital Stay (HOSPITAL_COMMUNITY): Payer: PPO

## 2020-06-17 ENCOUNTER — Inpatient Hospital Stay (HOSPITAL_COMMUNITY): Payer: PPO | Admitting: Occupational Therapy

## 2020-06-17 DIAGNOSIS — E8809 Other disorders of plasma-protein metabolism, not elsewhere classified: Secondary | ICD-10-CM

## 2020-06-17 DIAGNOSIS — R112 Nausea with vomiting, unspecified: Secondary | ICD-10-CM

## 2020-06-17 DIAGNOSIS — N179 Acute kidney failure, unspecified: Secondary | ICD-10-CM

## 2020-06-17 DIAGNOSIS — R5381 Other malaise: Principal | ICD-10-CM

## 2020-06-17 DIAGNOSIS — E46 Unspecified protein-calorie malnutrition: Secondary | ICD-10-CM

## 2020-06-17 DIAGNOSIS — E1165 Type 2 diabetes mellitus with hyperglycemia: Secondary | ICD-10-CM

## 2020-06-17 DIAGNOSIS — I5023 Acute on chronic systolic (congestive) heart failure: Secondary | ICD-10-CM

## 2020-06-17 DIAGNOSIS — I4891 Unspecified atrial fibrillation: Secondary | ICD-10-CM

## 2020-06-17 DIAGNOSIS — N184 Chronic kidney disease, stage 4 (severe): Secondary | ICD-10-CM

## 2020-06-17 DIAGNOSIS — R1110 Vomiting, unspecified: Secondary | ICD-10-CM

## 2020-06-17 LAB — CBC WITH DIFFERENTIAL/PLATELET
Abs Immature Granulocytes: 0.06 10*3/uL (ref 0.00–0.07)
Basophils Absolute: 0 10*3/uL (ref 0.0–0.1)
Basophils Relative: 0 %
Eosinophils Absolute: 0.1 10*3/uL (ref 0.0–0.5)
Eosinophils Relative: 1 %
HCT: 29.6 % — ABNORMAL LOW (ref 36.0–46.0)
Hemoglobin: 8.9 g/dL — ABNORMAL LOW (ref 12.0–15.0)
Immature Granulocytes: 1 %
Lymphocytes Relative: 9 %
Lymphs Abs: 0.9 10*3/uL (ref 0.7–4.0)
MCH: 30.3 pg (ref 26.0–34.0)
MCHC: 30.1 g/dL (ref 30.0–36.0)
MCV: 100.7 fL — ABNORMAL HIGH (ref 80.0–100.0)
Monocytes Absolute: 1.1 10*3/uL — ABNORMAL HIGH (ref 0.1–1.0)
Monocytes Relative: 11 %
Neutro Abs: 7.4 10*3/uL (ref 1.7–7.7)
Neutrophils Relative %: 78 %
Platelets: 319 10*3/uL (ref 150–400)
RBC: 2.94 MIL/uL — ABNORMAL LOW (ref 3.87–5.11)
RDW: 17.8 % — ABNORMAL HIGH (ref 11.5–15.5)
WBC: 9.5 10*3/uL (ref 4.0–10.5)
nRBC: 1.2 % — ABNORMAL HIGH (ref 0.0–0.2)

## 2020-06-17 LAB — URINALYSIS, ROUTINE W REFLEX MICROSCOPIC
Bilirubin Urine: NEGATIVE
Glucose, UA: NEGATIVE mg/dL
Hgb urine dipstick: NEGATIVE
Ketones, ur: NEGATIVE mg/dL
Leukocytes,Ua: NEGATIVE
Nitrite: NEGATIVE
Protein, ur: NEGATIVE mg/dL
Specific Gravity, Urine: 1.015 (ref 1.005–1.030)
pH: 5 (ref 5.0–8.0)

## 2020-06-17 LAB — COMPREHENSIVE METABOLIC PANEL
ALT: 11 U/L (ref 0–44)
AST: 13 U/L — ABNORMAL LOW (ref 15–41)
Albumin: 2.7 g/dL — ABNORMAL LOW (ref 3.5–5.0)
Alkaline Phosphatase: 104 U/L (ref 38–126)
Anion gap: 14 (ref 5–15)
BUN: 91 mg/dL — ABNORMAL HIGH (ref 8–23)
CO2: 21 mmol/L — ABNORMAL LOW (ref 22–32)
Calcium: 8.5 mg/dL — ABNORMAL LOW (ref 8.9–10.3)
Chloride: 99 mmol/L (ref 98–111)
Creatinine, Ser: 3.01 mg/dL — ABNORMAL HIGH (ref 0.44–1.00)
GFR, Estimated: 15 mL/min — ABNORMAL LOW (ref >60)
Glucose, Bld: 163 mg/dL — ABNORMAL HIGH (ref 70–99)
Potassium: 4.5 mmol/L (ref 3.5–5.1)
Sodium: 134 mmol/L — ABNORMAL LOW (ref 135–145)
Total Bilirubin: 0.6 mg/dL (ref 0.3–1.2)
Total Protein: 5.9 g/dL — ABNORMAL LOW (ref 6.5–8.1)

## 2020-06-17 LAB — PROTIME-INR
INR: 2 — ABNORMAL HIGH (ref 0.8–1.2)
Prothrombin Time: 22.3 seconds — ABNORMAL HIGH (ref 11.4–15.2)

## 2020-06-17 LAB — MAGNESIUM: Magnesium: 2.8 mg/dL — ABNORMAL HIGH (ref 1.7–2.4)

## 2020-06-17 LAB — GLUCOSE, CAPILLARY
Glucose-Capillary: 158 mg/dL — ABNORMAL HIGH (ref 70–99)
Glucose-Capillary: 258 mg/dL — ABNORMAL HIGH (ref 70–99)
Glucose-Capillary: 246 mg/dL — ABNORMAL HIGH (ref 70–99)
Glucose-Capillary: 195 mg/dL — ABNORMAL HIGH (ref 70–99)

## 2020-06-17 MED ORDER — PROSOURCE PLUS PO LIQD
30.0000 mL | Freq: Two times a day (BID) | ORAL | Status: DC
  Administered 2020-06-17 – 2020-06-18 (×2): 30 mL via ORAL
  Filled 2020-06-17 (×3): qty 30

## 2020-06-17 MED ORDER — WARFARIN 0.5 MG HALF TABLET
0.5000 mg | ORAL_TABLET | Freq: Once | ORAL | Status: AC
  Administered 2020-06-17: 0.5 mg via ORAL
  Filled 2020-06-17: qty 1

## 2020-06-17 NOTE — Progress Notes (Signed)
[facility] for  warfarin Indication: atrial fibrillation  Allergies  Allergen Reactions  . Avandia [Rosiglitazone] Other (See Comments)    Causes muscle weakness.  . Latex Rash  . Sulfa Antibiotics Rash    Patient Measurements: Height: 5\' 8"  (172.7 cm) Weight: 81 kg (178 lb 9.2 oz) IBW/kg (Calculated) : 63.9 Heparin Dosing Weight: 79.3 kg  Vital Signs: BP: 104/57 (01/13 0605) Pulse Rate: 81 (01/13 0605)  Labs: Recent Labs    06/15/20 0036 06/15/2020 0039 06/17/20 0507  HGB 8.6*  --  8.9*  HCT 28.7*  --  29.6*  PLT 312  --  319  LABPROT 21.3* 20.9* 22.3*  INR 1.9* 1.9* 2.0*  CREATININE 2.73* 2.88* 3.01*    Estimated Creatinine Clearance: 17.2 mL/min (A) (by C-G formula based on SCr of 3.01 mg/dL (H)).   Medical History: Past Medical History:  Diagnosis Date  . A-fib ([place])   . Acute on chronic combined systolic (congestive) and diastolic (congestive) heart failure ([place])   . Carotid artery occlusion   . DCM (dilated cardiomyopathy) ([place])    EF 30-35% by echo 05/2020  . Diabetes mellitus   . Hyperlipidemia   . Irregular heart beat   . Vitamin D deficiency      Assessment: [name] on warfarin for afib PTA now holding warfarin for [facility]. Pharmacy consulted to start heparin when INR <2. Admit INR 2.8> down to 1.8.  Heparin started > heparin stopped post cath.   INR now at 2, therapeutic, after warfarin 1 mg x2 doses. Hgb 8.9, plt 319. No s/sx of bleeding.   Warfarin PTA dose: 1.5mg  Fridays, 1mg  all other days (Last seen 05/26/20 by [facility]).   Goal of Therapy:  INR 2-3 Monitor platelets by anticoagulation protocol: Yes   Plan:  -Will order Coumadin 0.5 mg x 1 tonight - will dose with caution, appears sensitivity increased with amiodarone. -Daily INR, CBC  Antonietta Jewel, PharmD, [place] Pharmacist  Phone: [phone] 06/17/2020 9:18 AM  Please check AMION for all [facility] phone numbers After 10:00 PM, call [place] [phone]

## 2020-06-17 NOTE — Evaluation (Signed)
Occupational Therapy Assessment and Plan  Patient Details  Name: Cynthia Hansen MRN: [id number] Date of Birth: Oct 07, 1940  OT Diagnosis: muscle weakness (generalized) Rehab Potential:   ELOS: 7-10 days   Today's Date: 06/17/2020 OT Individual Time: 0900-1005 OT Individual Time Calculation (min): 94 min     Hospital Problem: Principal Problem:   Debility Active Problems:   Hypoalbuminemia due to protein-calorie malnutrition ([place])   Controlled type 2 diabetes mellitus with hyperglycemia, without long-term current use of insulin (HCC)   Non-intractable vomiting   Chronic kidney disease (CKD), stage IV (severe) (HCC)   Acute on chronic systolic (congestive) heart failure (HCC)   Past Medical History:  Past Medical History:  Diagnosis Date  . A-fib ([place])   . Acute on chronic combined systolic (congestive) and diastolic (congestive) heart failure ([place])   . Carotid artery occlusion   . DCM (dilated cardiomyopathy) ([place])    EF 30-35% by echo 05/2020  . Diabetes mellitus   . Hyperlipidemia   . Irregular heart beat   . Vitamin D deficiency    Past Surgical History:  Past Surgical History:  Procedure Laterality Date  . ABDOMINAL HERNIA REPAIR    . CAROTID ENDARTERECTOMY Left Dec. 3, 2008    cea  . CATARACT EXTRACTION Left   . HYSTEROSCOPY WITH D & C N/A 07/30/2014   Procedure: DILATATION AND CURETTAGE /HYSTEROSCOPY;  Surgeon: Osborne Oman, MD;  Location: [facility];  Service: Gynecology;  Laterality: N/A;  . ICD IMPLANT N/A 06/13/2020   Procedure: ICD IMPLANT;  Surgeon: Evans Lance, MD;  Location: [facility];  Service: Cardiovascular;  Laterality: N/A;  . IR THORACENTESIS ASP PLEURAL SPACE W/IMG GUIDE  06/08/2020  . IUD REMOVAL N/A 07/30/2014   Procedure: INTRAUTERINE DEVICE (IUD) REMOVAL;  Surgeon: Osborne Oman, MD;  Location: [facility];  Service: Gynecology;  Laterality: N/A;  . RIGHT HEART CATH N/A 06/07/2020   Procedure: RIGHT HEART CATH;  Surgeon: Larey Dresser,  MD;  Location: [facility];  Service: Cardiovascular;  Laterality: N/A;    Assessment & Plan Clinical Impression: Patient is a 80 y.o. year old female with history of A fib- on coumadin, T2DM, ulcerative colitis (Dr. Watt Climes),  DDD- neck/back (Dr. Nelva Bush), tobacco use, recent diagnosis of systolic CHF with difficulty managing on outpatient basis.  History taken from chart review and daughter.  She was admitted on 06/02/2020 with shortness of breath, fluid overload with marked LLE edema and acute on chronic renal failure with hyperkalemia.  She was started on IV lasix with plans for cardiac cath to work up cardiomyopathy with ejection fraction of 35-40%.  Hospital course significant for code blue on 06/05/2020 due to pulseless ventricular tachycardia, requiring CPR/shock with ROSC after 4 minutes as well as question of aspiration event. She was started on ceftriaxone for treatment and underwent right thoracocentesis of clear yellow 700 cc on 06/09/2019.  Nephrology was consulted for input regarding acute on chronic renal failure with hyponateremia and felt that AKI likely multifactorial, due to Frarxiga/hypotension/VT. Dr. Hollie Salk recommended increasing midodrine for BP support and renal ultrasound done revealing moderate right renal parenchyma atrophy and atropic/poorly visualized left kidney. Iron deficiency anemia supplemented with ferriheme X 1.  Dr. Lovena Le consulted for input on VT and recommended ICD which was placed on 06/13/2020 once stable medically. Nephrology consulted due to  ICD placed on 01/03. SCr has stabilized to 2.7 range and she was cleared to resume diuretics as weight on upward trend. Therapy ongoing  and patient continues to be limited by weakness and hypotension, poor posture, as well as DOE/tachycardia with activity.  Patient transferred to CIR on 06/19/2020 .    Patient currently requires mod with basic self-care skills and IADL secondary to muscle weakness, decreased cardiorespiratoy  endurance and decreased standing balance and decreased postural control.  Prior to hospitalization, patient could complete ADL/IADL with modified independent .  Patient will benefit from skilled intervention to decrease level of assist with basic self-care skills, increase independence with basic self-care skills and increase level of independence with iADL prior to discharge home with care partner.  Anticipate patient will require intermittent supervision and follow up home health.  OT - End of Session Activity Tolerance: Tolerates 10 - 20 min activity with multiple rests Endurance Deficit: Yes Endurance Deficit Description: Nausea contributing as well as generalized deconditioning. Requires extended rest breaks for recovery, fatigue with adl tasks OT Assessment OT Patient demonstrates impairments in the following area(s): Balance;Endurance;Pain;Safety;Sensory OT Basic ADL's Functional Problem(s): Grooming;Bathing;Dressing;Toileting OT Advanced ADL's Functional Problem(s): Simple Meal Preparation;Light Housekeeping OT Transfers Functional Problem(s): Toilet;Tub/Shower OT Plan OT Intensity: Minimum of 1-2 x/day, 45 to 90 minutes OT Frequency: 5 out of 7 days OT Duration/Estimated Length of Stay: 7-10 days OT Treatment/Interventions: Balance/vestibular training;Self Care/advanced ADL retraining;Therapeutic Exercise;DME/adaptive equipment instruction;Pain management;UE/LE Strength taining/ROM;Patient/family education;Discharge planning;Functional mobility training;Therapeutic Activities OT Self Feeding Anticipated Outcome(s): independent OT Basic Self-Care Anticipated Outcome(s): supervision/mod I OT Toileting Anticipated Outcome(s): supervision OT Bathroom Transfers Anticipated Outcome(s): supervision OT Recommendation Patient destination: Home Follow Up Recommendations: Home health OT Equipment Recommended: 3 in 1 bedside comode;Tub/shower bench   OT Evaluation Precautions/Restrictions   Precautions Precautions: ICD/Pacemaker;Fall Precaution Comments: Monitor HR and BP. Has been orthostatic. ICD precautions (s/p 1/9) Restrictions Weight Bearing Restrictions: No Vital Signs Therapy Vitals Temp: 97.6 F (36.4 C) Pulse Rate: 74 Resp: 19 BP: 120/74 Patient Position (if appropriate): Lying Oxygen Therapy SpO2: 99 % O2 Device: Room Air Pain Pain Assessment Pain Scale: 0-10 Pain Score: 3  Pain Type: Chronic pain Pain Location: Neck Pain Orientation: Posterior Pain Descriptors / Indicators: Aching Pain Intervention(s): Repositioned Home Living/Prior Functioning Home Living Family/patient expects to be discharged to:: Private residence Living Arrangements: Alone Available Help at Discharge: Family,Available PRN/intermittently,Other (Comment) Type of Home: Other(Comment) (condo) Home Access: Stairs to enter [employer] of Steps: 3-4 tall steps with R ascending rail- steps are very tall and she has had trouble with this for awhile Entrance Stairs-Rails: Left Home Layout: Two level Alternate Level Stairs-Number of Steps: has a chair lift that she can ride up inside [employer] Shower/Tub: Holiday representative Toilet: Handicapped height Additional Comments: On wait list for Assisted [facility] condo in [place] - [facility]  Lives With: Alone Prior Function Level of Independence: Independent with gait,Independent with transfers,Independent with basic ADLs,Independent with homemaking with ambulation  Able to Take Stairs?: Yes (Ability to do this has decreased) Driving: Yes 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88 Comments: has DME from her late husband but does not really use it Vision Baseline Vision/History: No visual deficits Patient Visual Report: No change from baseline Vision Assessment?: No apparent visual deficits Perception  Perception: Within Functional Limits Praxis Praxis: Intact Cognition Overall  Cognitive Status: Within Functional Limits for tasks assessed Arousal/Alertness: Awake/alert Orientation Level: Person;Place;Situation Person: Oriented Place: Oriented Situation: Oriented Year: 2022 Month: January Day of Week: Correct Memory: Appears intact Immediate Memory Recall: Sock;Blue;Bed Memory Recall Sock: Without Cue Memory Recall Blue: Without Cue Memory Recall Bed: Without Cue Awareness: Appears  intact Problem Solving: Appears intact Safety/Judgment: Appears intact Comments: patient notes some short term memory impairment Sensation Sensation Light Touch: Impaired by gross assessment Hot/Cold: Appears Intact Additional Comments: Limited by baseline neuropathy Coordination Gross Motor Movements are Fluid and Coordinated: Yes Finger Nose Finger Test: left arm with sensory deficit for approx 3 months - painful at times, mild dysmetria Motor  Motor Motor: Within Functional Limits Motor - Skilled Clinical Observations: General deblity and weakness from recent hospitalization  Balance Static Sitting Balance Static Sitting - Level of Assistance: 5: Stand by assistance Dynamic Sitting Balance Dynamic Sitting - Level of Assistance: 4: Min assist Static Standing Balance Static Standing - Level of Assistance: 4: Min assist Dynamic Standing Balance Dynamic Standing - Level of Assistance: 4: Min assist Extremity/Trunk Assessment RUE Assessment RUE Assessment: Within Functional Limits LUE Assessment General Strength Comments: able to use functionally below 90- she s/o pain at times and sensory impairment  Care Tool Care Tool Self Care Eating   Eating Assist Level: Set up assist    Oral Care    Oral Care Assist Level: Supervision/Verbal cueing    Bathing   Body parts bathed by patient: Right arm;Left arm;Chest;Abdomen;Front perineal area;Right upper leg;Left upper leg;Face Body parts bathed by helper: Buttocks;Right lower leg;Left lower leg   Assist Level:  Moderate Assistance - Patient 50 - 74%    Upper Body Dressing(including orthotics)   What is the patient wearing?: Button up shirt   Assist Level: Moderate Assistance - Patient 50 - 74%    Lower Body Dressing (excluding footwear)   What is the patient wearing?: Underwear/pull up;Pants Assist for lower body dressing: Maximal Assistance - Patient 25 - 49%    Putting on/Taking off footwear   What is the patient wearing?: Ted hose;Non-skid slipper socks Assist for footwear: Dependent - Patient 0%       Care Tool Toileting Toileting activity   Assist for toileting: Maximal Assistance - Patient 25 - 49%     Care Tool Bed Mobility Roll left and right activity Roll left and right activity did not occur: Safety/medical concerns (unable to tolerate HOB flat due to nausea)      Sit to lying activity Sit to lying activity did not occur: Safety/medical concerns Sit to lying assist level:  (unable to tolerate HOB flat due to nausea)    Lying to sitting edge of bed activity Lying to sitting edge of bed activity did not occur: N/A Lying to sitting edge of bed assist level:  (Remained sitting in w/c at end of session)     Care Tool Transfers Sit to stand transfer   Sit to stand assist level: Minimal Assistance - Patient > 75%    Chair/bed transfer   Chair/bed transfer assist level: Minimal Assistance - Patient > 75%     Toilet transfer         Care Tool Cognition Expression of Ideas and Wants Expression of Ideas and Wants: Without difficulty (complex and basic) - expresses complex messages without difficulty and with speech that is clear and easy to understand   Understanding Verbal and Non-Verbal Content Understanding Verbal and Non-Verbal Content: Understands (complex and basic) - clear comprehension without cues or repetitions   Memory/Recall Ability *first 3 days only Memory/Recall Ability *first 3 days only: Current season;That he or she is in a hospital/hospital unit    Refer  to Care Plan for Long Term Goals  SHORT TERM GOAL WEEK 1 OT Short Term Goal 1 (Week 1): STG =  LTG due to estimated LOS - overall supervision level for self care, basic, HM and functional transfers  Recommendations for other services: None    Skilled Therapeutic Intervention  Patient in bed, alert and aware of needs.  She notes history of neck pain and recent difficulty with left shoulder mobility.  She reports that she is currently having some nausea but would like to get OOB for evaluation and ADL training.   Evaluation completed as documented above - she presents with generalized weakness, fatigue and balance impairment limiting independence and safety with self care and mobility.   Reviewed role of OT, goals for therapy and plan of care.  She demonstrates good understanding.  Completed SPT to/from bed, commode and w/c with min A to steady.  She performed bathing at bed level for lower body and seated in w/c for upper body - see adl details below.  Fatigue and nausea limited her ability to complete dressing tasks more independently this session.  SPT back to bed at close of session with min A, min A also needed for sit to supine position.   She remained in bed at close of session, with call bell in hand and bed alarm set .    ADL ADL Eating: Set up Where Assessed-Eating: Bed level Grooming: Minimal assistance Where Assessed-Grooming: Sitting at sink Upper Body Bathing: Minimal assistance Where Assessed-Upper Body Bathing: Sitting at sink Lower Body Bathing: Maximal assistance Where Assessed-Lower Body Bathing: Bed level Upper Body Dressing: Moderate assistance Where Assessed-Upper Body Dressing: Sitting at sink Lower Body Dressing: Maximal assistance Where Assessed-Lower Body Dressing: Sitting at sink Toileting: Maximal assistance Where Assessed-Toileting: Bedside Commode Toilet Transfer: Minimal assistance Toilet Transfer Method: Stand pivot Toilet Transfer Equipment: Bedside  commode ADL Comments: declined shower on eval due to fatigue and nausea, slow to complete all adl tasks Mobility  Bed Mobility Bed Mobility: Sit to Supine Supine to Sit: Minimal Assistance - Patient > 75% Sit to Supine: Minimal Assistance - Patient > 75% Transfers Sit to Stand: Minimal Assistance - Patient > 75% Stand to Sit: Minimal Assistance - Patient > 75%   Discharge Criteria: Patient will be discharged from OT if patient refuses treatment 3 consecutive times without medical reason, if treatment goals not met, if there is a change in medical status, if patient makes no progress towards goals or if patient is discharged from hospital.  The above assessment, treatment plan, treatment alternatives and goals were discussed and mutually agreed upon: by patient  Carlos Levering 06/17/2020, 4:08 PM

## 2020-06-17 NOTE — Evaluation (Signed)
Physical Therapy Assessment and Plan  Patient Details  Name: Cynthia Hansen MRN: [id number] Date of Birth: 07/26/1940  PT Diagnosis: Abnormality of gait, Difficulty walking, Dizziness and giddiness, Impaired sensation and Muscle weakness Rehab Potential: Good ELOS: 7-10days   Today's Date: 06/17/2020 PT Individual Time: 1100-1200 + [id number] PT Individual Time Calculation (min): 60 min  + 70 min  Hospital Problem: Principal Problem:   Debility   Past Medical History:  Past Medical History:  Diagnosis Date  . A-fib ([place])   . Acute on chronic combined systolic (congestive) and diastolic (congestive) heart failure ([place])   . Carotid artery occlusion   . DCM (dilated cardiomyopathy) ([place])    EF 30-35% by echo 05/2020  . Diabetes mellitus   . Hyperlipidemia   . Irregular heart beat   . Vitamin D deficiency    Past Surgical History:  Past Surgical History:  Procedure Laterality Date  . ABDOMINAL HERNIA REPAIR    . CAROTID ENDARTERECTOMY Left Dec. 3, 2008    cea  . CATARACT EXTRACTION Left   . HYSTEROSCOPY WITH D & C N/A 07/30/2014   Procedure: DILATATION AND CURETTAGE /HYSTEROSCOPY;  Surgeon: Osborne Oman, MD;  Location: [facility];  Service: Gynecology;  Laterality: N/A;  . ICD IMPLANT N/A 06/13/2020   Procedure: ICD IMPLANT;  Surgeon: Evans Lance, MD;  Location: [facility];  Service: Cardiovascular;  Laterality: N/A;  . IR THORACENTESIS ASP PLEURAL SPACE W/IMG GUIDE  06/08/2020  . IUD REMOVAL N/A 07/30/2014   Procedure: INTRAUTERINE DEVICE (IUD) REMOVAL;  Surgeon: Osborne Oman, MD;  Location: [facility];  Service: Gynecology;  Laterality: N/A;  . RIGHT HEART CATH N/A 06/07/2020   Procedure: RIGHT HEART CATH;  Surgeon: Larey Dresser, MD;  Location: [facility];  Service: Cardiovascular;  Laterality: N/A;    Assessment & Plan Clinical Impression: Patient is a 80 year old female with history of A fib- on coumadin, T2DM, ulcerative colitis (Dr. Watt Climes),  DDD-  neck/back (Dr. Nelva Bush), tobacco use, recent diagnosis of systolic CHF with difficulty managing on outpatient basis.  History taken from chart review and daughter.  She was admitted on 06/02/2020 with shortness of breath, fluid overload with marked LLE edema and acute on chronic renal failure with hyperkalemia.  She was started on IV lasix with plans for cardiac cath to work up cardiomyopathy with ejection fraction of 35-40%.  Hospital course significant for code blue on 06/05/2020 due to pulseless ventricular tachycardia, requiring CPR/shock with ROSC after 4 minutes as well as question of aspiration event. She was started on ceftriaxone for treatment and underwent right thoracocentesis of clear yellow 700 cc on 06/09/2019.  Nephrology was consulted for input regarding acute on chronic renal failure with hyponateremia and felt that AKI likely multifactorial, due to Frarxiga/hypotension/VT. Dr. Hollie Salk recommended increasing midodrine for BP support and renal ultrasound done revealing moderate right renal parenchyma atrophy and atropic/poorly visualized left kidney. Iron deficiency anemia supplemented with ferriheme X 1.  Dr. Lovena Le consulted for input on VT and recommended ICD which was placed on 06/13/2020 once stable medically. Nephrology consulted due to  ICD placed on 01/03. SCr has stabilized to 2.7 range and she was cleared to resume diuretics as weight on upward trend. Therapy ongoing and patient continues to be limited by weakness and hypotension, poor posture, as well as DOE/tachycardia with activity. CIR recommended due to functional decline. Patient transferred to CIR on 06/18/2020 .   Patient currently requires min with mobility secondary  to muscle weakness, decreased cardiorespiratoy endurance and decreased standing balance and decreased balance strategies.  Prior to hospitalization, patient was modified independent  with mobility and lived with Alone in a Other(Comment) (Condo) home.  Home access is 3-4  tall steps with R ascending rail- steps are very tall and she has had trouble with this for awhileStairs to enter.  Patient will benefit from skilled PT intervention to maximize safe functional mobility, minimize fall risk and decrease caregiver burden for planned discharge home with 24 hour supervision. Anticipate patient will benefit from follow up [place] at discharge.  PT - End of Session Activity Tolerance: Tolerates 10 - 20 min activity with multiple rests Endurance Deficit: Yes Endurance Deficit Description: Nausea contributing as well as generalized deconditioning. Requires extended rest breaks for recovery PT Assessment Rehab Potential (ACUTE/IP ONLY): Good PT Barriers to Discharge: Inaccessible home environment;Decreased caregiver support;Home environment access/layout;Lack of/limited family support;Insurance for SNF coverage PT Patient demonstrates impairments in the following area(s): Balance;Edema;Endurance;Motor;Nutrition;Pain;Sensory;Skin Integrity PT Transfers Functional Problem(s): Bed Mobility;Bed to Chair;Car PT Locomotion Functional Problem(s): Ambulation;Stairs PT Plan PT Intensity: Minimum of 1-2 x/day ,45 to 90 minutes PT Frequency: 5 out of 7 days PT Duration Estimated Length of Stay: 10 days PT Treatment/Interventions: Balance/vestibular training;Community reintegration;Disease management/prevention;Ambulation/gait training;Cognitive remediation/compensation;Discharge planning;DME/adaptive equipment instruction;Functional mobility training;Neuromuscular re-education;Pain management;Psychosocial support;Stair training;Skin care/wound management;Patient/family education;Splinting/orthotics;Therapeutic Activities;Therapeutic Exercise;UE/LE Coordination activities;UE/LE Strength taining/ROM;Wheelchair propulsion/positioning;Visual/perceptual remediation/compensation PT Transfers Anticipated Outcome(s): supervision with LRAD PT Locomotion Anticipated Outcome(s): supervision with  LRAD PT Recommendation Follow Up Recommendations: Home health PT Patient destination: Home Equipment Recommended: To be determined Equipment Details: Pt owns DME from husband.   PT Evaluation Precautions/Restrictions Precautions Precautions: ICD/Pacemaker;Fall;Other (comment) Precaution Comments: Monitor HR and BP. Has been orthostatic. ICD precautions (s/p 1/9) Restrictions Weight Bearing Restrictions: No General Chart Reviewed: Yes Additional Pertinent History: History of A fib- on coumadin, T2DM, ulcerative colitis, tobacco use, recent diagnosis of systolic CHF with difficulty managing on outpatient basis. Family/Caregiver Present: No  Home Living/Prior Functioning Home Living Living Arrangements: Alone Available Help at Discharge: Family;Available PRN/intermittently;Other (Comment) (Daughter works full time) Type of Home: Other(Comment) (Condo) Home Access: Stairs to enter [employer] of Steps: 3-4 tall steps with R ascending rail- steps are very tall and she has had trouble with this for awhile Entrance Stairs-Rails: Left Home Layout: Two level Alternate Level Stairs-Number of Steps: has a chair lift that she can ride up inside Additional Comments: On wait list for Assisted [facility] condo in [place] - [facility]  Lives With: Alone Prior Function Level of Independence: Independent with gait;Independent with transfers  Able to Take Stairs?: Yes (Ability to do this has decreased) Driving: Yes 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70 Comments: has DME from her late husband but does not really use it Vision/Perception  Geologist, engineering: Within Functional Limits Praxis Praxis: Intact  Cognition Overall Cognitive Status: Within Functional Limits for tasks assessed Arousal/Alertness: Awake/alert Orientation Level: Oriented X4 Memory: Appears intact Problem Solving: Appears intact Safety/Judgment: Appears  intact Sensation Sensation Light Touch: Impaired by gross assessment Hot/Cold: Appears Intact Proprioception: Impaired by gross assessment Stereognosis: Appears Intact Additional Comments: Limited by baseline neuropathy Coordination Gross Motor Movements are Fluid and Coordinated: Yes (Limited by nausea and general deconditioning) Fine Motor Movements are Fluid and Coordinated: Yes Motor  Motor Motor: Within Functional Limits Motor - Skilled Clinical Observations: General deblity and weakness from recent hospitalization   Trunk/Postural Assessment  Cervical Assessment Cervical Assessment: Within Functional Limits Thoracic Assessment Thoracic Assessment: Exceptions to [facility] (mild  kyphosis) Lumbar Assessment Lumbar Assessment: Exceptions to [facility] (posterior pelvic tilt) Postural Control Postural Control: Within Functional Limits  Balance Balance Balance Assessed: Yes Static Sitting Balance Static Sitting - Balance Support: Feet supported Static Sitting - Level of Assistance: 5: Stand by assistance Dynamic Sitting Balance Dynamic Sitting - Balance Support: During functional activity Dynamic Sitting - Level of Assistance: Other (comment) (CGA) Static Standing Balance Static Standing - Balance Support: No upper extremity supported Static Standing - Level of Assistance: 4: Min assist Dynamic Standing Balance Dynamic Standing - Balance Support: During functional activity Dynamic Standing - Level of Assistance: 4: Min assist Extremity Assessment      RLE Assessment RLE Assessment: Exceptions to [facility] General Strength Comments: Grossly 4-/5 LLE Assessment LLE Assessment: Exceptions to [facility] General Strength Comments: Grossly 4-/5  Care Tool Care Tool Bed Mobility Roll left and right activity Roll left and right activity did not occur: Safety/medical concerns (unable to tolerate HOB flat due to nausea)      Sit to lying activity Sit to lying activity did not occur: Safety/medical  concerns Sit to lying assist level:  (unable to tolerate HOB flat due to nausea)    Lying to sitting edge of bed activity Lying to sitting edge of bed activity did not occur: N/A Lying to sitting edge of bed assist level:  (Remained sitting in w/c at end of session)     Care Tool Transfers Sit to stand transfer   Sit to stand assist level: Minimal Assistance - Patient > 75%    Chair/bed transfer   Chair/bed transfer assist level: Minimal Assistance - Patient > 75%     Psychologist, counselling transfer activity did not occur: Safety/medical concerns Car transfer assist level:  (nausea)      Care Tool Locomotion Ambulation   Assist level: Minimal Assistance - Patient > 75% Assistive device: Walker-rolling Max distance: 65f  Walk 10 feet activity   Assist level: Minimal Assistance - Patient > 75% Assistive device: Walker-rolling   Walk 50 feet with 2 turns activity   Assist level: Minimal Assistance - Patient > 75% Assistive device: Walker-rolling  Walk 150 feet activity Walk 150 feet activity did not occur: Safety/medical concerns Assist level:  (nausea)    Walk 10 feet on uneven surfaces activity Walk 10 feet on uneven surfaces activity did not occur: Safety/medical concerns      Stairs Stair activity did not occur: Safety/medical concerns        Walk up/down 1 step activity Walk up/down 1 step or curb (drop down) activity did not occur: Safety/medical concerns     Walk up/down 4 steps activity did not occuR: Safety/medical concerns  Walk up/down 4 steps activity      Walk up/down 12 steps activity Walk up/down 12 steps activity did not occur: Safety/medical concerns      Pick up small objects from floor Pick up small object from the floor (from standing position) activity did not occur: Safety/medical concerns      Wheelchair Will patient use wheelchair at discharge?: No          Wheel 50 feet with 2 turns activity      Wheel 150 feet  activity        Refer to Care Plan for [place] PT Short Term Goal 1 (Week 1): STG = LTG due to ELOS  Recommendations for other services: Neuropsych  Skilled Therapeutic  Intervention Mobility Bed Mobility Bed Mobility: Rolling Right;Rolling Left;Supine to Sit;Sit to Supine Rolling Right: Minimal Assistance - Patient > 75% Rolling Left: Minimal Assistance - Patient > 75% Supine to Sit: Minimal Assistance - Patient > 75% (HOB elevated, unable to tolerate flat HOB due to nausea) Transfers Transfers: Sit to Stand;Stand Pivot Transfers;Stand to Sit Sit to Stand: Minimal Assistance - Patient > 75% Stand to Sit: Minimal Assistance - Patient > 75% Stand Pivot Transfers: Minimal Assistance - Patient > 75% Stand Pivot Transfer Details: Verbal cues for sequencing;Visual cues/gestures for sequencing;Verbal cues for gait pattern;Verbal cues for technique;Verbal cues for precautions/safety;Visual cues/gestures for precautions/safety;Visual cues for safe use of DME/AE;Tactile cues for sequencing;Tactile cues for posture Transfer (Assistive device): None Locomotion  Gait Ambulation: Yes Gait Assistance: Minimal Assistance - Patient > 75% Gait Distance (Feet): 65 Feet Assistive device: Rolling walker Gait Assistance Details: Verbal cues for gait pattern;Verbal cues for technique;Verbal cues for precautions/safety;Verbal cues for safe use of DME/AE;Tactile cues for posture;Tactile cues for sequencing Gait Gait: Yes Gait Pattern: Impaired Gait Pattern: Trunk flexed;Shuffle;Poor foot clearance - right;Poor foot clearance - left;Decreased step length - left;Decreased step length - right;Decreased hip/knee flexion - right;Decreased hip/knee flexion - left Gait velocity: decreased Stairs / Additional Locomotion Stairs: No Wheelchair Mobility Wheelchair Mobility: No  Skilled Intervention:  1st session: Pt greeted semi-reclined in bed, awake and agreeable to therapy  session. She is in obvious discomfort on my arrival and she reports this is primarily contributed by her nausea. She reports she has been experiencing nausea since she coded in the hospital and she has a hx of Vertigo from several years ago. RN was notified who provided medication during our session.  Instructed pt in results of PT evaluation as detailed above, PT POC, rehab potential, rehab goals, and discharge recommendations. Additionally discussed CIR's policies regarding fall safety and use of chair alarm and/or quick release belt. Pt verbalized understanding and in agreement. Will update pt's family members as they become available.   Due to nausea, she was unable to tolerate HOB flat. With bed features, she was able to complete supine<>sit with minA, primarily for trunk elevation. Once sitting EOB, she required significant time for calming nausea but good sitting balance noted. She was able to complete sit<>stand with minA and no AD from slightly boosted EOB height, difficulty producing power to rise. She was able to maintain standing with minA due to posterior lean and then performed stand<>pivot transfer with minA and no AD to her w/c. BP assessed in seated position, reading 126/74.  Performed gait, x80f with minA and RW on level surfaces. Gait deficits include short shuffling steps, forward flexed trunk, and heavy reliance of BUE through RW. Gait distances limited primarily by fatigue and nausea. In fact, after gait trial, she had episodes of emesis. Further mobility deferred and pt remained seated in w/c with chair alarm on and needs within reach. Made comfortable. RN notified after session   2nd session: Pt greeted supine in bed, awake and agreeable to therapy. She denies pain but continues to report nausea. Rest breaks and emotional support provided for relief during our session. Donned Tennis shoes with totalA while supine in bed for time management. Supine<>sit with CGA/minA with bed  features (unable to tolerate HOB flat due to nausea). Able to sit EOB with SBA and performed sit<>stand with minA to RW from raised EOB height. Stand step transfer to w/c from EOB and w/c transport to ortho gym for time management.   Performed car  transfer with minA and no AD via stand<>pivot method. Car height set to replicate her [place]. She had most difficulty with raising her legs into the car but she reports this is baseline and have had difficulty with this for years.   Gait training x29f with minA and RW, cues for erect posture, RW management, guided breathing. No formal LOB or knee buckling noted but unsteadiness present, especially with turns. She required lengthy seated rest break due to nausea/fatigue after this.  Performed stairs, navigating up/down x8 small 3inch steps (pt unwilling to try the 6inch steps due to fear). She was able to complete this with minA via a step-to pattern. Cues for deep breathing, paced activity , and advancing hands on hand rail. No knee buckling or LOB noted. Reports moderate fatigue after completion and also reports mild nausea. She requires another lengthy rest break for recovery.   Performed TUG, with RW and CGA/minA, scoring 35 seconds for completion. Score indicative of increased falls risk. Score >13.5 seconds the cut-off for high falls risk for community dwelling older adults.   Pt deferring any further mobility training due to worsening nausea. She was returned to her [place] with [place]. Stand<>pivot with minA and no AD (using bed rail) from w/c back to bed. She required minA for sit>supine for BLE management. Remained semi-reclined in bed with needs in reach and bed alarm on. She was made comfortable and RN was notified of her nausea at end of session.  Discharge Criteria: Patient will be discharged from PT if patient refuses treatment 3 consecutive times without medical reason, if treatment goals not met, if there is a change in medical status, if  patient makes no progress towards goals or if patient is discharged from hospital.  The above assessment, treatment plan, treatment alternatives and goals were discussed and mutually agreed upon: by patient   CAlger SimonsPT, DPT 06/17/2020, 12:20 PM

## 2020-06-17 NOTE — Progress Notes (Signed)
Patient c/o nausea during therapy today.  RN gave PRN medication and was told by therapy she vomited during therapy.  Patient c/o food not tasting good and having no appetite.  RN explained to patient and daughter on how to progress with eating to start small amounts and work up to more.  Patient and daughter voiced understanding.  RN present with IV team member when trying to place IV and not successful.  Patient was having pain with flushing.  I explained to patient and daughter regarding the order for Feraheme IVPB and waiting for IV placement.  Both voiced understanding.

## 2020-06-17 NOTE — Progress Notes (Signed)
[facility] PHYSICAL MEDICINE & REHABILITATION PROGRESS NOTE  Subjective/Complaints: Patient seen sitting up in bed this morning.  She states she slept fairly overnight due to left arm pain, which she states started after her COVID injection back in October.  She states she is anxious about getting therapies.  ROS: Denies CP, SOB, N/V/D  Objective: Vital Signs: Blood pressure (!) 104/57, pulse 81, temperature 97.7 F (36.5 C), resp. rate 14, height 5\' 8"  (1.727 m), weight 81 kg, SpO2 100 %. No results found. Recent Labs    06/15/20 0036 06/17/20 0507  WBC 11.7* 9.5  HGB 8.6* 8.9*  HCT 28.7* 29.6*  PLT 312 319   Recent Labs    06/30/2020 0039 06/17/20 0507  NA 132* 134*  K 4.4 4.5  CL 98 99  CO2 21* 21*  GLUCOSE 232* 163*  BUN 89* 91*  CREATININE 2.88* 3.01*  CALCIUM 8.4* 8.5*    Intake/Output Summary (Last 24 hours) at 06/17/2020 1248 Last data filed at 06/28/2020 2306 Gross per 24 hour  Intake -  Output 450 ml  Net -450 ml        Physical Exam: BP (!) 104/57   Pulse 81   Temp 97.7 F (36.5 C)   Resp 14   Ht 5\' 8"  (1.727 m)   Wt 81 kg   SpO2 100%   BMI 27.15 kg/m  Constitutional: No distress . Vital signs reviewed. HENT: Normocephalic.  Atraumatic. Eyes: EOMI. No discharge. Cardiovascular: No JVD.  Irregularly irregular.Marland Kitchen Respiratory: Normal effort.  No stridor.  Bilateral clear to auscultation. GI: Non-distended.  BS +. Skin: Warm and dry.  Intact.  Scattered ecchymosis. Dressing CDI. Psych: Normal mood.  Normal behavior. Musc: No edema in extremities.  No tenderness in extremities. Neuro: Alert Motor: 4+/5 throughout, unchanged Sensation diminished to light touch distal to bilateral knees   Assessment/Plan: 1. Functional deficits which require 3+ hours per day of interdisciplinary therapy in a comprehensive inpatient rehab setting.  Physiatrist is providing close team supervision and 24 hour management of active medical problems listed  below.  Physiatrist and rehab team continue to assess barriers to discharge/monitor patient progress toward functional and medical goals   Care Tool:  Bathing              Bathing assist       Upper Body Dressing/Undressing Upper body dressing        Upper body assist      Lower Body Dressing/Undressing Lower body dressing      What is the patient wearing?: Hospital gown only     Lower body assist Assist for lower body dressing: Independent     Toileting Toileting    Toileting assist Assist for toileting: Minimal Assistance - Patient > 75%     Transfers Chair/bed transfer  Transfers assist     Chair/bed transfer assist level: Minimal Assistance - Patient > 75%     Locomotion Ambulation   Ambulation assist      Assist level: Minimal Assistance - Patient > 75% Assistive device: Walker-rolling Max distance: 57ft   Walk 10 feet activity   Assist     Assist level: Minimal Assistance - Patient > 75% Assistive device: Walker-rolling   Walk 50 feet activity   Assist    Assist level: Minimal Assistance - Patient > 75% Assistive device: Walker-rolling    Walk 150 feet activity   Assist Walk 150 feet activity did not occur: Safety/medical concerns  Assist level:  (nausea)  Walk 10 feet on uneven surface  activity   Assist Walk 10 feet on uneven surfaces activity did not occur: Safety/medical concerns         Wheelchair     Assist Will patient use wheelchair at discharge?: No             Wheelchair 50 feet with 2 turns activity    Assist            Wheelchair 150 feet activity     Assist           Medical Problem List and Plan: 1.  Limited by weakness and hypotension, poor posture, as well as DOE/tachycardia with activity secondary to cardiac debility.  Begin CIR evaluations 2.  Antithrombotics: -DVT/anticoagulation:  Pharmaceutical: Coumadin (goal 2-3)  Therapeutic on 1/13              -antiplatelet therapy: N/A 3. Neuropathy/Pain Management: Ultram prn for LUE pain             -- tylenol 1000 mg tid-->decrease to 650 mg tid.             Monitor with increased exertion. 4. Mood: LCSW to follow for evaluation and support.              -antipsychotic agents: N/A 5. Neuropsych: This patient is capable of making decisions on her own behalf. 6. Skin/Wound Care: Routine pressure relief measures 7. Fluids/Electrolytes/Nutrition: Strict I/Os.  8. Acute on chronic systolic CHF: Strict I/O.              Torsemide resumed 1/10              On Lipitor, Colestid, Demadex BB due to soft BP/ no ace due to to CKD. Filed Weights   06/26/2020 1545 06/17/20 0700  Weight: 82.4 kg 81 kg   9. Acute on chronic Stage IV: Continue midodrine tid             Cr 3.01 on 1/13  Appreciate nephro recs  Labs ordered for tomorrow 10. Hyponatremia: Monitor with daily checks.              Sodium 134 on 1/13  Continue to monitor 11. Leucocytosis: Resolved  Monitor for signs of infection and for fevers.              Continue to monitor 12. A fib: Monitor HR tid. On Pacerone 200 mg bid and coumadin.   Monitor with increased exertion 13. Aspiration PNA: Completed 10 day course of ceftriaxone on 1/10 14. Chest wall pain: Continue Lidoderm patches with tramadol prn.  15. VT arrest s/p ICD 1/09: On pacer precautions.  16. T2DM with hyperglycemia: Monitor BS ac/hs. on glimepiride at home.  Continue SSI for now.              Monitor with increased mobility 17. Ulcerative colitis: On mesalamine--has been refusing Rx-->daughter to bring her med from home as this is better tolerated.  18. Constipation:  Has not had BM since VT per family--added miralax for now and d/c once mesalamine on board (as had diarrhea SE)               Adjust bowel meds as necessary 19. Anemia of chronic disease:  Repeat fareheme today. Monitor H/H with serial checks.               Hemoglobin 8.9 on 1/13  Continue to monitor 20.   Nausea and vomiting  Zofran as needed             See #18             Improving 21.  Hypoalbuminemia  Supplement initiated on 1/13   LOS: 1 days A FACE TO FACE EVALUATION WAS PERFORMED  Ankit Lorie Phenix 06/17/2020, 12:48 PM

## 2020-06-17 NOTE — Progress Notes (Signed)
Inpatient Rehabilitation Care Coordinator Assessment and Plan Patient Details  Name: Cynthia Hansen MRN: [id number] Date of Birth: [birth year]  Today's Date: 06/17/2020  Hospital Problems: Principal Problem:   Debility  Past Medical History:  Past Medical History:  Diagnosis Date  . A-fib ([place])   . Acute on chronic combined systolic (congestive) and diastolic (congestive) heart failure ([place])   . Carotid artery occlusion   . DCM (dilated cardiomyopathy) ([place])    EF 30-35% by echo 05/2020  . Diabetes mellitus   . Hyperlipidemia   . Irregular heart beat   . Vitamin D deficiency    Past Surgical History:  Past Surgical History:  Procedure Laterality Date  . ABDOMINAL HERNIA REPAIR    . CAROTID ENDARTERECTOMY Left Dec. 3, 2008    cea  . CATARACT EXTRACTION Left   . HYSTEROSCOPY WITH D & C N/A 07/30/2014   Procedure: DILATATION AND CURETTAGE /HYSTEROSCOPY;  Surgeon: Osborne Oman, MD;  Location: [facility];  Service: Gynecology;  Laterality: N/A;  . ICD IMPLANT N/A 06/13/2020   Procedure: ICD IMPLANT;  Surgeon: Evans Lance, MD;  Location: [facility];  Service: Cardiovascular;  Laterality: N/A;  . IR THORACENTESIS ASP PLEURAL SPACE W/IMG GUIDE  06/08/2020  . IUD REMOVAL N/A 07/30/2014   Procedure: INTRAUTERINE DEVICE (IUD) REMOVAL;  Surgeon: Osborne Oman, MD;  Location: [facility];  Service: Gynecology;  Laterality: N/A;  . RIGHT HEART CATH N/A 06/07/2020   Procedure: RIGHT HEART CATH;  Surgeon: Larey Dresser, MD;  Location: [facility];  Service: Cardiovascular;  Laterality: N/A;   Social History:  reports that she quit smoking about 3 years ago. Her smoking use included cigarettes. She quit after 50.00 years of use. She has never used smokeless tobacco. She reports current alcohol use. She reports that she does not use drugs.  Family / Support Systems Marital Status: Widow/Widower Patient Roles: Parent Children: Abigail Butts [place]-daughter [phone]  Jim-son  [phone] Other Supports: Rita-sister [phone] Anticipated Caregiver: Abigail Butts Ability/Limitations of Caregiver: Abigail Butts works at [employer] Availability: Intermittent Family Dynamics: Close with both children and sister, she has friends who will check on her. She is very independent and has always taken care of herself.  Social History Preferred language: English Religion: Non-Denominational Cultural Background: No issues Education: HS Read: Yes Write: Yes Employment Status: Retired Public relations account executive Issues: No issues Guardian/Conservator: None-according to MD pt is capable of making her own decisions while here. Will include daughter per pt's request   Abuse/Neglect Abuse/Neglect Assessment Can Be Completed: Yes Physical Abuse: Denies Verbal Abuse: Denies Sexual Abuse: Denies Exploitation of patient/patient's resources: Denies Self-Neglect: Denies  Emotional Status Pt's affect, behavior and adjustment status: Pt is nauseated today after OT session, she reports since she has been ill this has been the case. She has always been independent and taken care of herself and she wants to get back to this level. Recent Psychosocial Issues: other health issues-decline over the past three weeks-weaker Psychiatric History: No issues/history deferred depression screen due to pt seems to be coping appropriately and can talk about her feelings and concerns. Substance Abuse History: Quit tobacco 3 years ago  Patient / Family Perceptions, Expectations & Goals Pt/Family understanding of illness & functional limitations: Pt can explain her medical issues and the fact daughter saved her by being there and calling a code. She reports she would have died if she was not there. She does talk with the MD and feels she has a good  understanding of her plan going forward. Premorbid pt/family roles/activities: Mom, grandmother, retiree, sibling, church member, etc Anticipated  changes in roles/activities/participation: resume Pt/family expectations/goals: Pt states: " I want to get stronger while here and hopefully not be so nauseated."  Daughter states: " I can check on her but live in [place] and work."  [employer]: None Premorbid Home Care/DME Agencies: None Transportation available at discharge: Self rely upon daughter until she can drive again  Discharge Planning Living Arrangements: [place]: Engineer, manufacturing relatives,Friends/neighbors,Church/faith community Type of Residence: Private residence Administrator, sports: Multimedia programmer (specify) (Health team Adv) Financial Resources: Radio broadcast assistant Screen Referred: No Living Expenses: Own Money Management: Patient Does the patient have any problems obtaining your medications?: No Home Management: Self Patient/Family Preliminary Plans: Return to her home with daughter checking on intermittently. She does have a stair lift inside of her home. She is on a waiting list for a condo in [place] closer to her daughter. Will await therapy team evaluations and work on safe discharge plan. Care Coordinator Barriers to Discharge: Decreased caregiver support Care Coordinator Barriers to Discharge Comments: Needs to be mod/i by discharge does not have 24/7 care Care Coordinator Anticipated Follow Up Needs: HH/OP  Clinical Impression Pleasant female who is motivated to do well here and push through her nausea. She is weak and needs to build up her strength while here. Daughter can check on her at home but not provide 24/7 care. Will await therapy evaluations and work on safe plan for her.  Elease Hashimoto 06/17/2020, 11:10 AM

## 2020-06-17 NOTE — Progress Notes (Signed)
[facility] Individual Statement of Services  Patient Name:  Cynthia Hansen  Date:  06/17/2020  Welcome to the [facility].  Our goal is to provide you with an individualized program based on your diagnosis and situation, designed to meet your specific needs.  With this comprehensive rehabilitation program, you will be expected to participate in at least 3 hours of rehabilitation therapies Monday-Friday, with modified therapy programming on the weekends.  Your rehabilitation program will include the following services:  Physical Therapy (PT), Occupational Therapy (OT), Speech Therapy (ST), 24 hour per day rehabilitation nursing, Rehabilitation Medicine, Nutrition Services and Pharmacy Services  Weekly team conferences will be held on Wednesday to discuss your progress.  Your Inpatient Rehabilitation Care Coordinator will talk with you frequently to get your input and to update you on team discussions.  Team conferences with you and your family in attendance may also be held.  Expected length of stay: 7-10 days  Overall anticipated outcome:supervision level   Depending on your progress and recovery, your program may change. Your Inpatient Rehabilitation Care Coordinator will coordinate services and will keep you informed of any changes. Your Inpatient Rehabilitation Care Coordinator's name and contact numbers are listed  below.  The following services may also be recommended but are not provided by the [facility] will be made to provide these services after discharge if needed.  Arrangements include referral to agencies that provide these services.  Your insurance has been verified to be:  [employer] Your primary doctor is:  Eritrea Rankins  Pertinent information will be shared with your doctor and your  insurance company.  Inpatient Rehabilitation Care Coordinator:  Ovidio Kin, [place] or Emilia Beck  Information discussed with and copy given to patient by: Elease Hashimoto, 06/17/2020, 11:11 AM

## 2020-06-17 NOTE — Progress Notes (Signed)
  [facility] Progress Note   Assessment/ Plan:   1. AKI on CKD Stage IV - Baseline appears 1.8-2.3. AKI 2/2 ATN w/ arrest but also related to CHF and relative hypotension leading to decrease perfusion. This is likely why Crt has plateaued. Cr now up to 3 today.  - midodrine 10mg  tid - Continue holding Farxiga and ACEI/ARB -hold torsemide for tomorrow (rec'd dose this am) at least, watching vol status closely. 2. Acute systolic CHF w/ cardiac arrest: TTE 12/21 with EF 35% - 40% - AHF following. Appreciate recommendations - s/p ICD 1/9 -on torsemide, see above  3. Iron Deficiency anemia: CKD may be contributing. S/p feraheme x1 and redose next week. 4. Hyponatremia: improved 5. Nutrition:Low sodium diet   Dispo: CIR  Subjective:   Cynthia Hansen is a 80 y.o. with PMH of Afib, DM II, HTN, h/o CEA and new dx of dilated cardiomyopathy with EF 30-35% who is seen in consultation for AKI on CKD. She is on hospital day 1.  No acute events, no complaints. Now in cir.   Objective:   BP (!) 104/57   Pulse 81   Temp 97.7 F (36.5 C)   Resp 14   Ht 5\' 8"  (1.727 m)   Wt 81 kg   SpO2 100%   BMI 27.15 kg/m   Physical Exam: WGY:KZLDJ in bed, NAD CVS: normal rate, no murmur  Resp: Normal work of breathing , bilateral chest rise Abd: Soft, NT Ext: No edema  Labs: BMET Recent Labs  Lab 06/11/20 0051 06/12/20 0059 06/13/20 0105 06/14/20 0050 06/15/20 0036 06/06/2020 0039 06/17/20 0507  NA 134* 135 134* 133* 133* 132* 134*  K 4.1 4.2 4.4 4.3 4.4 4.4 4.5  CL 101 102 101 101 98 98 99  CO2 20* 21* 20* 19* 23 21* 21*  GLUCOSE 157* 113* 155* 255* 215* 232* 163*  BUN 76* 75* 77* 79* 82* 89* 91*  CREATININE 2.85* 2.88* 2.85* 2.72* 2.73* 2.88* 3.01*  CALCIUM 8.8* 8.6* 8.8* 8.3* 8.6* 8.4* 8.5*   CBC Recent Labs  Lab 06/13/20 0105 06/14/20 0050 06/15/20 0036 06/17/20 0507  WBC 9.7 11.6* 11.7* 9.5  NEUTROABS  --   --   --  7.4  HGB 8.7* 8.6* 8.6* 8.9*  HCT 26.9*  28.8* 28.7* 29.6*  MCV 93.1 97.6 97.3 100.7*  PLT 298 287 312 319      Medications:    . acetaminophen  650 mg Oral TID  . amiodarone  200 mg Oral BID  . atorvastatin  80 mg Oral Daily  . colestipol  1 g Oral BID  . hydrocortisone cream   Topical QID  . insulin aspart  0-5 Units Subcutaneous QHS  . insulin aspart  0-9 Units Subcutaneous TID WC  . lidocaine  2 patch Transdermal Q24H  . melatonin  3 mg Oral QHS  . mesalamine  4.8 g Oral Q breakfast  . methimazole  5 mg Oral Daily  . midodrine  10 mg Oral TID WC  . polyethylene glycol  17 g Oral Daily  . Ensure Max Protein  11 oz Oral BID  . warfarin  0.5 mg Oral ONCE-1600  . Warfarin - Pharmacist Dosing Inpatient   Does not apply [id number]     [id number]  06/17/2020, 12:22 PM

## 2020-06-17 NOTE — Progress Notes (Signed)
[facility] consulted to obtain PIV access for feraheme administration.  [facility] RN assessed patient's right lower arm utilizing ultrasound. Pt did not want IV in hand (CKD). Attempted IV placement X2, both utilizing [place] (visualizing needle tip in center of vessel each time). Each time IV with good blood return, however patient jumped with flushing of NS and stated it burned. IV's discontinued.  Second IV assessment requested from [facility].

## 2020-06-17 NOTE — Progress Notes (Signed)
Inpatient Rehabilitation  Patient information reviewed and entered into eRehab system by Luba Matzen M. Zakir Henner, M.A., CCC/SLP, PPS Coordinator.  Information including medical coding, functional ability and quality indicators will be reviewed and updated through discharge.    

## 2020-06-17 NOTE — Progress Notes (Addendum)
Advanced Heart Failure Rounding Note  PCP-Cardiologist: Larae Grooms, MD   Subjective:    Transferred to CIR 1/12.  Bump in SCr 2.88>>3.01. BU 89>>91.   Wt down 3 lb.   Feeling tired and weak today, nauseated (chronic)  Also complaining of posterior neck pain from sleeping in bed.   No dyspnea or chest pain.   Objective:   Weight Range: 81 kg Body mass index is 27.15 kg/m.   Vital Signs:   Temp:  [97.5 F (36.4 C)-98 F (36.7 C)] 97.7 F (36.5 C) (01/12 1923) Pulse Rate:  [73-81] 81 (01/13 0605) Resp:  [14-18] 14 (01/13 0605) BP: (98-114)/(57-67) 104/57 (01/13 0605) SpO2:  [100 %] 100 % (01/13 0605) Weight:  [81 kg-82.4 kg] 81 kg (01/13 0700) Last BM Date: 06/13/19  Weight change: Filed Weights   06/26/2020 1545 06/17/20 0700  Weight: 82.4 kg 81 kg    Intake/Output:   Intake/Output Summary (Last 24 hours) at 06/17/2020 0900 Last data filed at 06/06/2020 2306 Gross per 24 hour  Intake --  Output 450 ml  Net -450 ml      Physical Exam    General:  fatigue appearing. No resp difficulty HEENT: Normal Neck: Supple. JVP 8 cm . Carotids 2+ bilat; no bruits. No lymphadenopathy or thyromegaly appreciated. Cor: PMI nondisplaced. Regular rate & rhythm. No rubs, gallops or murmurs. Lungs: Clear Abdomen: Soft, nontender, nondistended. No hepatosplenomegaly. No bruits or masses. Good bowel sounds. Extremities: No cyanosis, clubbing, rash, edema Neuro: Alert & orientedx3, cranial nerves grossly intact. moves all 4 extremities w/o difficulty. Affect pleasant   Telemetry   N/a   EKG    No new EKG to review   Labs    CBC Recent Labs    06/15/20 0036 06/17/20 0507  WBC 11.7* 9.5  NEUTROABS  --  7.4  HGB 8.6* 8.9*  HCT 28.7* 29.6*  MCV 97.3 100.7*  PLT 312 270   Basic Metabolic Panel Recent Labs    06/28/2020 0039 06/17/20 0507  NA 132* 134*  K 4.4 4.5  CL 98 99  CO2 21* 21*  GLUCOSE 232* 163*  BUN 89* 91*  CREATININE 2.88* 3.01*   CALCIUM 8.4* 8.5*  MG 2.6* 2.8*   Liver Function Tests Recent Labs    06/17/20 0507  AST 13*  ALT 11  ALKPHOS 104  BILITOT 0.6  PROT 5.9*  ALBUMIN 2.7*   No results for input(s): LIPASE, AMYLASE in the last 72 hours. Cardiac Enzymes No results for input(s): CKTOTAL, CKMB, CKMBINDEX, TROPONINI in the last 72 hours.  BNP: BNP (last 3 results) Recent Labs    05/27/20 0828 06/02/20 1832  BNP 2,213.1* 2,338.9*    ProBNP (last 3 results) Recent Labs    05/13/20 1550 05/31/20 0939  PROBNP 46,357* 48,555*     D-Dimer No results for input(s): DDIMER in the last 72 hours. Hemoglobin A1C No results for input(s): HGBA1C in the last 72 hours. Fasting Lipid Panel No results for input(s): CHOL, HDL, LDLCALC, TRIG, CHOLHDL, LDLDIRECT in the last 72 hours. Thyroid Function Tests No results for input(s): TSH, T4TOTAL, T3FREE, THYROIDAB in the last 72 hours.  Invalid input(s): FREET3  Other results:   Imaging     No results found.   Medications:     Scheduled Medications: . acetaminophen  650 mg Oral TID  . amiodarone  200 mg Oral BID  . aspirin  81 mg Oral Daily  . atorvastatin  80 mg Oral Daily  .  colestipol  1 g Oral BID  . hydrocortisone cream   Topical QID  . insulin aspart  0-5 Units Subcutaneous QHS  . insulin aspart  0-9 Units Subcutaneous TID WC  . lidocaine  2 patch Transdermal Q24H  . melatonin  3 mg Oral QHS  . mesalamine  4.8 g Oral Q breakfast  . methimazole  5 mg Oral Daily  . midodrine  10 mg Oral TID WC  . polyethylene glycol  17 g Oral Daily  . Ensure Max Protein  11 oz Oral BID  . Warfarin - Pharmacist Dosing Inpatient   Does not apply q1600     Infusions: . ferumoxytol       PRN Medications:  acetaminophen, alum & mag hydroxide-simeth, bisacodyl, diphenhydrAMINE, guaiFENesin-dextromethorphan, ondansetron (ZOFRAN) IV, polyethylene glycol, prochlorperazine **OR** prochlorperazine **OR** prochlorperazine, sodium phosphate,  traMADol, traZODone    Assessment/Plan   1. Acute on chronic systolic CHF: Cardiolite in 11/21 with EF 26%, inferior fixed perfusion defect with no ischemia. Echo in 12/21 with EF 35-40%, moderate LV dilation, mildly decreased RV function. Cause of cardiomyopathy uncertain but there is concern for ischemic cardiomyopathy. Cardiac MRI was difficult study, EF 25-30% estimated without definitive LGE. Worsening CHF recently complicated by CKD stage IV. She has had NYHA class III symptoms.  She diuresed well initially but had VT arrest.  RHC this admit w/ mildly elevated filling pressures, mild pulmonary HTN and relatively preserved CO.  - Bump in Scr from 2.88>>3.02. Wt down 3 lb. Hold diuretics x 1 day, then resume torsemide at lower dose 20 mg daily. Follow BMP.   - Coreg on hold for hypotension and dapagliflozin stopped with AKI.  - c/w midodrine 10 mg tid to keep SBP persistently > 110 for renal perfusion  - Will be limited in terms of HF meds we can use with CKD 4, history of hyperkalemia, and soft BP. For now, no ARNI/ARB/digoxin/spironolactone.   - Will be Verquvo candidate (can initiate as outpatient)  - Ideally, would have coronary angiography to look for CAD as cause of cardiomyopathy and VT. However, not candidate at this time in absence of ACS given AKI.   2. AKI on CKD Stage IV: RHC showed relatively preserved cardiac output. SCr bump per above.  - hold diuretics x 1 day. Restart torsemide at 20 mg 1/15 if SCr improved. Follow daily wts/ BMP - c/w midodrine 10 mg tid keep SBP >110 for renal perfusion  - Nephrology following. Appreciate their assistance.   3. Chronic atrial fibrillation: This appears permanent, x years. Has biatrial severe enlargement and unlikely to hold NSR with DCCV attempt. INR 2.0 today.  - Continue warfarin. Dosing per pharmacy. Stop ASA   4. Low TSH: Concern for hyperthyroidism. Free T4 is high, but free T3 is normal. Thyroid stimulating antibody negative  -  Discussed abnormal TFTs with endocrinology, started on  low dose methimazole 5 mg qam followup with endocrinology as outpatient.  5. CAD: NSTEMI with HS-TnI up to 7990 post-VT arrest and CPR.  As above, have been concerned for CAD as cause of cardiomyopathy.  ?Rise in troponin due to demand ischemia versus ACS => no ischemic pain (just chest wall tenderness post-CPR) and no ECG change (old ASMI as in past).    - Continue atorvastatin 80 mg daily.  - INR threapeutic at 2.0 today. Can stop ASA - Ideally, would do coronary angiography but think we need improvement in creatinine for that in absence of ACS.  - cMRI w/ poor imaging,  Very difficult delayed enhancement images. Possible subtle subendocardial LGE in the mid inferior wall seen on the short axis images, but this could not be confirmed in the long axis images. There does not appear to be a large amount of scar.  6. Cardiac arrest: Pulseless VT.  Electrolytes were stable. No chest pain or dyspnea prior.  ?Cause => suspect scar-mediated.  - Continue amiodarone 200 mg po bid.  - S/p ICD placement 1/9.   7. Pulmonary: RLL PNA post-arrest by CXR.  Suspect aspiration.  - Resolved. Completed course of ceftriaxone. AF. WBC normal   8. Pleural Effusion: S/P R Thoracentesis with 700 cc removed. Transudate from CHF.  - repeat CXR 1/10 w/ small bilateral pleural effusions  - stable w/o significant dyspnea   9. Hyponatremia: improving, Na 134 today    - follow BMP   10. Anemia: Hgb trending up, 8.9 today.  Likely Anemia of chronic disease from CKD. No overt GI bleeding but has Fe deficiency.  - FOBT negative.  - Received 1st dose of feraheme, will get 2nd dose today (ordered)   11. Neuropathic pain left arm:  Chronic. She has had side effects with gabapentin.  - c/w atenolol and tramadol.    12 Deconditioning - now in CIR   Length of Stay: 1  Brittainy Simmons, PA-C  06/17/2020, 9:00 AM  Advanced Heart Failure Team Pager [phone] (M-F; [place])  Please contact [facility] for night-coverage after hours (4p -7a ) and weekends on [email or web address]   Patient seen and examined with the above-signed Advanced Practice Provider and/or Housestaff. I personally reviewed laboratory data, imaging studies and relevant notes. I independently examined the patient and formulated the important aspects of the plan. I have edited the note to reflect any of my changes or salient points. I have personally discussed the plan with the patient and/or family.  Feels weak and nauseated. Creatinine up slightly. ICD site ok   General:  Weak and frail appearing. No resp difficulty HEENT: normal Neck: supple. no JVD. Carotids 2+ bilat; no bruits. No lymphadenopathy or thryomegaly appreciated. Cor: PMI nondisplaced. Regular rate & rhythm. No rubs, gallops or murmurs. Lungs: clear Abdomen: soft, nontender, nondistended. No hepatosplenomegaly. No bruits or masses. Good bowel sounds. Extremities: no cyanosis, clubbing, rash, edema Neuro: alert & orientedx3, cranial nerves grossly intact. moves all 4 extremities w/o difficulty. Affect pleasant  Agee with holding diuretics for at least 1-2 days and following renal function. ICD site ok. Continue rehab. Appreciate CIR.   Glori Bickers, MD  10:01 AM

## 2020-06-18 ENCOUNTER — Inpatient Hospital Stay (HOSPITAL_COMMUNITY): Payer: PPO

## 2020-06-18 ENCOUNTER — Inpatient Hospital Stay (HOSPITAL_COMMUNITY): Payer: PPO | Admitting: Occupational Therapy

## 2020-06-18 DIAGNOSIS — D638 Anemia in other chronic diseases classified elsewhere: Secondary | ICD-10-CM

## 2020-06-18 DIAGNOSIS — K5901 Slow transit constipation: Secondary | ICD-10-CM

## 2020-06-18 DIAGNOSIS — R7309 Other abnormal glucose: Secondary | ICD-10-CM

## 2020-06-18 DIAGNOSIS — E871 Hypo-osmolality and hyponatremia: Secondary | ICD-10-CM

## 2020-06-18 LAB — BASIC METABOLIC PANEL
Anion gap: 14 (ref 5–15)
BUN: 99 mg/dL — ABNORMAL HIGH (ref 8–23)
CO2: 20 mmol/L — ABNORMAL LOW (ref 22–32)
Calcium: 8.5 mg/dL — ABNORMAL LOW (ref 8.9–10.3)
Chloride: 98 mmol/L (ref 98–111)
Creatinine, Ser: 3.31 mg/dL — ABNORMAL HIGH (ref 0.44–1.00)
GFR, Estimated: 14 mL/min — ABNORMAL LOW (ref >60)
Glucose, Bld: 143 mg/dL — ABNORMAL HIGH (ref 70–99)
Potassium: 4.4 mmol/L (ref 3.5–5.1)
Sodium: 132 mmol/L — ABNORMAL LOW (ref 135–145)

## 2020-06-18 LAB — GLUCOSE, CAPILLARY
Glucose-Capillary: 132 mg/dL — ABNORMAL HIGH (ref 70–99)
Glucose-Capillary: 219 mg/dL — ABNORMAL HIGH (ref 70–99)
Glucose-Capillary: 272 mg/dL — ABNORMAL HIGH (ref 70–99)
Glucose-Capillary: 196 mg/dL — ABNORMAL HIGH (ref 70–99)

## 2020-06-18 LAB — PROTIME-INR
INR: 2.2 — ABNORMAL HIGH (ref 0.8–1.2)
Prothrombin Time: 23.3 seconds — ABNORMAL HIGH (ref 11.4–15.2)

## 2020-06-18 LAB — MAGNESIUM: Magnesium: 2.9 mg/dL — ABNORMAL HIGH (ref 1.7–2.4)

## 2020-06-18 MED ORDER — SENNOSIDES-DOCUSATE SODIUM 8.6-50 MG PO TABS
1.0000 | ORAL_TABLET | Freq: Every day | ORAL | Status: DC
  Filled 2020-06-18 (×2): qty 1

## 2020-06-18 MED ORDER — HYDROCORTISONE 1 % EX CREA
TOPICAL_CREAM | Freq: Four times a day (QID) | CUTANEOUS | Status: DC | PRN
  Filled 2020-06-18: qty 28

## 2020-06-18 MED ORDER — POLYETHYLENE GLYCOL 3350 17 G PO PACK
17.0000 g | PACK | Freq: Two times a day (BID) | ORAL | Status: DC
  Administered 2020-06-18 – 2020-06-20 (×4): 17 g via ORAL
  Filled 2020-06-18 (×5): qty 1

## 2020-06-18 MED ORDER — WARFARIN 0.5 MG HALF TABLET
0.5000 mg | ORAL_TABLET | Freq: Once | ORAL | Status: AC
  Administered 2020-06-18: 0.5 mg via ORAL
  Filled 2020-06-18: qty 1

## 2020-06-18 MED ORDER — AMIODARONE HCL 200 MG PO TABS
200.0000 mg | ORAL_TABLET | Freq: Every day | ORAL | Status: DC
  Administered 2020-06-19 – 2020-06-20 (×2): 200 mg via ORAL
  Filled 2020-06-18 (×2): qty 1

## 2020-06-18 NOTE — Progress Notes (Signed)
Patient ID: Cynthia Hansen, female   DOB: Nov 19, 1940, 80 y.o.   MRN: [id number] Met with pt to discuss team's goals are for supervision level and the plan. She reports her daughter is planning on getting FMLA to be there with her. She is not feeling well today-tired and constipated along with nauseated. She hopes to move her bowels and then will feel better. Encouraged her to continue to drink and stay hydrated. She will try. Have made copies of her HCPOA and Durable POA and placed in chart and pt has the originals for her daughter to take back home. Continue to work on discharge plans.

## 2020-06-18 NOTE — Progress Notes (Addendum)
Advanced Heart Failure Rounding Note  PCP-Cardiologist: Larae Grooms, MD   Subjective:     Diuretics have been on hold. Creatinine 2.8>3>3.3   Complaining of nausea/vomiting. Appetite poor.    Objective:   Weight Range: 83.1 kg Body mass index is 27.86 kg/m.   Vital Signs:   Temp:  [97.5 F (36.4 C)-97.6 F (36.4 C)] 97.5 F (36.4 C) (01/14 0419) Pulse Rate:  [60-83] 83 (01/14 0753) Resp:  [16-19] 16 (01/14 0419) BP: (103-120)/(54-74) 107/69 (01/14 0753) SpO2:  [98 %-100 %] 100 % (01/14 0753) Weight:  [83.1 kg] 83.1 kg (01/14 0500) Last BM Date: 06/12/20  Weight change: Filed Weights   06/28/2020 1545 06/17/20 0700 06/18/20 0500  Weight: 82.4 kg 81 kg 83.1 kg    Intake/Output:   Intake/Output Summary (Last 24 hours) at 06/18/2020 1306 Last data filed at 06/17/2020 1956 Gross per 24 hour  Intake --  Output 250 ml  Net -250 ml      Physical Exam    General:  In the chair. No resp difficulty HEENT: Normal Neck: Supple. JVP 6-7  . Carotids 2+ bilat; no bruits. No lymphadenopathy or thyromegaly appreciated. Cor: PMI nondisplaced. Irregular rate & rhythm. No rubs, gallops or murmurs. L upper chest steri strips from ICD.  Lungs: Clear Abdomen: Soft, nontender, nondistended. No hepatosplenomegaly. No bruits or masses. Good bowel sounds. Extremities: No cyanosis, clubbing, rash, edema Neuro: Alert & orientedx3, cranial nerves grossly intact. moves all 4 extremities w/o difficulty. Affect pleasant    Labs    CBC Recent Labs    06/17/20 0507  WBC 9.5  NEUTROABS 7.4  HGB 8.9*  HCT 29.6*  MCV 100.7*  PLT 737   Basic Metabolic Panel Recent Labs    06/17/20 0507 06/18/20 0527  NA 134* 132*  K 4.5 4.4  CL 99 98  CO2 21* 20*  GLUCOSE 163* 143*  BUN 91* 99*  CREATININE 3.01* 3.31*  CALCIUM 8.5* 8.5*  MG 2.8* 2.9*   Liver Function Tests Recent Labs    06/17/20 0507  AST 13*  ALT 11  ALKPHOS 104  BILITOT 0.6  PROT 5.9*  ALBUMIN 2.7*    No results for input(s): LIPASE, AMYLASE in the last 72 hours. Cardiac Enzymes No results for input(s): CKTOTAL, CKMB, CKMBINDEX, TROPONINI in the last 72 hours.  BNP: BNP (last 3 results) Recent Labs    05/27/20 0828 06/02/20 1832  BNP 2,213.1* 2,338.9*    ProBNP (last 3 results) Recent Labs    05/13/20 1550 05/31/20 0939  PROBNP 46,357* 48,555*     D-Dimer No results for input(s): DDIMER in the last 72 hours. Hemoglobin A1C No results for input(s): HGBA1C in the last 72 hours. Fasting Lipid Panel No results for input(s): CHOL, HDL, LDLCALC, TRIG, CHOLHDL, LDLDIRECT in the last 72 hours. Thyroid Function Tests No results for input(s): TSH, T4TOTAL, T3FREE, THYROIDAB in the last 72 hours.  Invalid input(s): FREET3  Other results:   Imaging     No results found.   Medications:     Scheduled Medications: . (feeding supplement) PROSource Plus  30 mL Oral BID BM  . acetaminophen  650 mg Oral TID  . amiodarone  200 mg Oral BID  . atorvastatin  80 mg Oral Daily  . colestipol  1 g Oral BID  . insulin aspart  0-5 Units Subcutaneous QHS  . insulin aspart  0-9 Units Subcutaneous TID WC  . lidocaine  2 patch Transdermal Q24H  . melatonin  3 mg Oral QHS  . mesalamine  4.8 g Oral Q breakfast  . methimazole  5 mg Oral Daily  . midodrine  10 mg Oral TID WC  . polyethylene glycol  17 g Oral BID  . Ensure Max Protein  11 oz Oral BID  . senna-docusate  1 tablet Oral QHS  . Warfarin - Pharmacist Dosing Inpatient   Does not apply q1600     Infusions: . ferumoxytol       PRN Medications:  acetaminophen, alum & mag hydroxide-simeth, bisacodyl, diphenhydrAMINE, guaiFENesin-dextromethorphan, hydrocortisone cream, ondansetron (ZOFRAN) IV, polyethylene glycol, prochlorperazine **OR** prochlorperazine **OR** prochlorperazine, sodium phosphate, traMADol, traZODone    Assessment/Plan   1. Acute on chronic systolic CHF: Cardiolite in 11/21 with EF 26%, inferior  fixed perfusion defect with no ischemia. Echo in 12/21 with EF 35-40%, moderate LV dilation, mildly decreased RV function. Cause of cardiomyopathy uncertain but there is concern for ischemic cardiomyopathy. Cardiac MRI was difficult study, EF 25-30% estimated without definitive LGE. Worsening CHF recently complicated by CKD stage IV. She has had NYHA class III symptoms. She diuresed well initially but had VT arrest. RHC this admit w/ mildly elevated filling pressures, mild pulmonary HTN and relatively preserved CO.  - Bump in Scr from 2.88>>3.02>>3.3  - Diuretics have been on hold. . - Coreg on hold for hypotension and dapagliflozin stopped with AKI.  -c/w midodrine 10mg  tidto keep SBP persistently > 144for renal perfusion - Will be limited in terms of HF meds we can use with CKD 4, history of hyperkalemia, and soft BP. For now, no ARNI/ARB/digoxin/spironolactone.  - Will be Verquvo candidate (can initiate as outpatient)  - Ideally, would have coronary angiography to look for CAD as cause of cardiomyopathy and VT. However, not candidate at this time in absence of ACS given AKI.   2. AKI on CKD Stage IV: RHC showed relatively preserved cardiac output.SCr bump per above.  - Creatinine continues to climb. 2.8>3>3.3 . Diuretics on hold.  - c/wmidodrine 10 mg tid keep SBP >135for renal perfusion - Nephrology follow on acute care.   3. Chronic atrial fibrillation: This appears permanent, x years. Has biatrial severe enlargement and unlikely to hold NSR with DCCV attempt. INR 2.2today.  - Rate controlled. Continue warfarin. Dosing per pharmacy.   4. Low TSH: Concern for hyperthyroidism. Free T4 is high, but free T3 is normal. Thyroid stimulating antibody negative  - Discussed abnormal TFTs with endocrinology, started on low dose methimazole 5 mg qam followup with endocrinology as outpatient.  5. CAD: NSTEMI with HS-TnI up to 7990 post-VT arrest and CPR. As above, have been  concerned for CAD as cause of cardiomyopathy. ?Rise in troponin due to demand ischemia versus ACS =>no ischemic pain (just chest wall tenderness post-CPR) and no ECG change (old ASMI as in past).  - Continue atorvastatin 80 mg daily.  - INR threapeutic at 2.2.  - Ideally, would do coronary angiography but think we need improvement in creatinine for that in absence of ACS.  - cMRI w/ poor imaging, Very difficult delayed enhancement images. Possible subtle subendocardial LGE in the mid inferior wall seen on the short axis images, but this could not be confirmed in the long axis images. There does not appear to be a large amount of scar.  6. Cardiac arrest: Pulseless VT. Electrolytes were stable. No chest pain or dyspnea prior. ?Cause =>suspect scar-mediated.  - Cut back amio to 200 mg daily. ? If this is causing nausea.   - S/p  ICD placement 1/9.   7. Pulmonary: RLL PNA post-arrest by CXR. Suspect aspiration.  - Resolved. Completed course of ceftriaxone. AF. WBC normal   8. Pleural Effusion: S/P R Thoracentesis with 700 cc removed. Transudate from CHF. - repeat CXR 1/10 w/ small bilateral pleural effusions   9. Hyponatremia: improving, Na 132  10. Anemia: Likely Anemia of chronic disease from CKD. No overt GI bleeding but has Fe deficiency.  - FOBT negative.  - Received 2 doses of feraheme.   - CBC pending.   11. Neuropathic pain left arm: Chronic. She has had side effects with gabapentin.  - c/w atenolol and tramadol.   12 Deconditioning - now in [place] team appreciated.    Length of Stay: 2  Darrick Grinder, NP  06/18/2020, 1:06 PM  Advanced Heart Failure Team Pager [phone] (M-F; 7a - 4p)  Please contact [facility] for night-coverage after hours (4p -7a ) and weekends on [email or web address]  Patient seen and examined with the above-signed Advanced Practice Provider and/or Housestaff. I personally reviewed laboratory data, imaging studies and relevant notes. I  independently examined the patient and formulated the important aspects of the plan. I have edited the note to reflect any of my changes or salient points. I have personally discussed the plan with the patient and/or family.  She continues to not feel well. C/o nausea and weakness. Also constipation and had to be manually disimpacted by RN. Creatinine continues to climb slowly. Diuretics on hold.  General:  Elderly woman. Sitting up in bed. No resp difficulty HEENT: normal Neck: supple. JVP 7-8 Carotids 2+ bilat; no bruits. No lymphadenopathy or thryomegaly appreciated. Cor: PMI nondisplaced. Regular rate & rhythm. No rubs, gallops or murmurs. Lungs: clear Abdomen: soft, nontender, nondistended. No hepatosplenomegaly. No bruits or masses. Good bowel sounds. Extremities: no cyanosis, clubbing, rash, edema Neuro: alert & orientedx3, cranial nerves grossly intact. moves all 4 extremities w/o difficulty. Affect pleasant  Remains very tenuous with cardiorenal syndrome. Will continue to hold diuretics and hopefully renal function will stabilize. She is not candidate for advanced HF therapies or HD. If renal function continues to deteriorate will likely need Hospice care. D/w Nephrology.   Glori Bickers, MD  6:53 PM

## 2020-06-18 NOTE — Progress Notes (Signed)
  [facility] Progress Note   Assessment/ Plan:   1. AKI on CKD Stage IV - Baseline appears 1.8-2.3. AKI 2/2 ATN w/ arrest but also related to CHF and relative hypotension leading to decrease perfusion. This is likely why Crt has plateaued. Cr now up to 3.3 today unfortunately.  - midodrine 10mg  tid - Continue holding Farxiga and ACEI/ARB -continue to hold torsemide, still looks clinically dry. Discussed w/ Dr. Haroldine Laws 2. Acute systolic CHF w/ cardiac arrest: TTE 12/21 with EF 35% - 40% - AHF following. Appreciate recommendations - s/p ICD 1/9 -hold torsemide as above 3. Iron Deficiency anemia: CKD may be contributing. S/p feraheme x1 and redose next week. 4. Hyponatremia: improved 5. Nutrition:Low sodium diet   Dispo: CIR  Subjective:   Cynthia Hansen is a 80 y.o. with PMH of Afib, DM II, HTN, h/o CEA and new dx of dilated cardiomyopathy with EF 30-35% who is seen in consultation for AKI on CKD. She is on hospital day 2.  Seen earlier in AM. No acute events. Cr up to 3.3   Objective:   BP 107/69 (BP Location: Right Arm)   Pulse 83   Temp (!) 97.5 F (36.4 C)   Resp 16   Ht 5\' 8"  (1.727 m)   Wt 83.1 kg   SpO2 100%   BMI 27.86 kg/m   Physical Exam: JJO:ACZYS in bed, NAD CVS: normal rate, no murmur  Resp: Normal work of breathing , bilateral chest rise Abd: Soft, NT Ext: No edema  Labs: BMET Recent Labs  Lab 06/12/20 0059 06/13/20 0105 06/14/20 0050 06/15/20 0036 07/03/2020 0039 06/17/20 0507 06/18/20 0527  NA 135 134* 133* 133* 132* 134* 132*  K 4.2 4.4 4.3 4.4 4.4 4.5 4.4  CL 102 101 101 98 98 99 98  CO2 21* 20* 19* 23 21* 21* 20*  GLUCOSE 113* 155* 255* 215* 232* 163* 143*  BUN 75* 77* 79* 82* 89* 91* 99*  CREATININE 2.88* 2.85* 2.72* 2.73* 2.88* 3.01* 3.31*  CALCIUM 8.6* 8.8* 8.3* 8.6* 8.4* 8.5* 8.5*   CBC Recent Labs  Lab 06/13/20 0105 06/14/20 0050 06/15/20 0036 06/17/20 0507  WBC 9.7 11.6* 11.7* 9.5  NEUTROABS  --   --   --   7.4  HGB 8.7* 8.6* 8.6* 8.9*  HCT 26.9* 28.8* 28.7* 29.6*  MCV 93.1 97.6 97.3 100.7*  PLT 298 287 312 319      Medications:    . (feeding supplement) PROSource Plus  30 mL Oral BID BM  . acetaminophen  650 mg Oral TID  . [START ON 06/19/2020] amiodarone  200 mg Oral Daily  . atorvastatin  80 mg Oral Daily  . colestipol  1 g Oral BID  . insulin aspart  0-5 Units Subcutaneous QHS  . insulin aspart  0-9 Units Subcutaneous TID WC  . lidocaine  2 patch Transdermal Q24H  . melatonin  3 mg Oral QHS  . mesalamine  4.8 g Oral Q breakfast  . methimazole  5 mg Oral Daily  . midodrine  10 mg Oral TID WC  . polyethylene glycol  17 g Oral BID  . Ensure Max Protein  11 oz Oral BID  . senna-docusate  1 tablet Oral QHS  . warfarin  0.5 mg Oral ONCE-1600  . Warfarin - Pharmacist Dosing Inpatient   Does not apply [id number]     SWFUX NATFT  06/18/2020, 4:43 PM

## 2020-06-18 NOTE — Progress Notes (Signed)
PHYSICAL MEDICINE & REHABILITATION PROGRESS NOTE  Subjective/Complaints: Patient seen sitting up in bed this morning.  She states she slept well overnight.  She states she is tired this morning having just completed therapies.  She notes constipation.  She denies nausea. She was seen by Cards and Nephro yesterday, notes reviewed - diuretics on hold x1 day per Cards, no changes per Nephro. She states she had a tiring day of therapies yesterday.   ROS: + Constipation, shortness of breath.  Denies CP, N/V/D  Objective: Vital Signs: Blood pressure 107/69, pulse 83, temperature (!) 97.5 F (36.4 C), resp. rate 16, height 5\' 8"  (1.727 m), weight 83.1 kg, SpO2 100 %. No results found. Recent Labs    06/17/20 0507  WBC 9.5  HGB 8.9*  HCT 29.6*  PLT 319   Recent Labs    06/17/20 0507 06/18/20 0527  NA 134* 132*  K 4.5 4.4  CL 99 98  CO2 21* 20*  GLUCOSE 163* 143*  BUN 91* 99*  CREATININE 3.01* 3.31*  CALCIUM 8.5* 8.5*    Intake/Output Summary (Last 24 hours) at 06/18/2020 1008 Last data filed at 06/17/2020 1956 Gross per 24 hour  Intake --  Output 250 ml  Net -250 ml        Physical Exam: BP 107/69 (BP Location: Right Arm)   Pulse 83   Temp (!) 97.5 F (36.4 C)   Resp 16   Ht 5\' 8"  (1.727 m)   Wt 83.1 kg   SpO2 100%   BMI 27.86 kg/m  Constitutional: No distress . Vital signs reviewed. HENT: Normocephalic.  Atraumatic. Eyes: EOMI. No discharge. Cardiovascular: No JVD.  Irregularly irregular.  Respiratory: Normal effort.  No stridor.  Bilateral clear to auscultation. GI: Non-distended.  BS +. Skin: Warm and dry.  Scattered ecchymosis.  Dressing CDI. Psych: Normal mood.  Normal behavior. Musc: No edema in extremities.  No tenderness in extremities. Neuro: Alert Motor: 4+/5 throughout, stable Sensation diminished to light touch distal to bilateral knees   Assessment/Plan: 1. Functional deficits which require 3+ hours per day of interdisciplinary  therapy in a comprehensive inpatient rehab setting.  Physiatrist is providing close team supervision and 24 hour management of active medical problems listed below.  Physiatrist and rehab team continue to assess barriers to discharge/monitor patient progress toward functional and medical goals   Care Tool:  Bathing    Body parts bathed by patient: Right arm,Left arm,Chest,Abdomen,Front perineal area,Right upper leg,Left upper leg,Face   Body parts bathed by helper: Buttocks,Right lower leg,Left lower leg     Bathing assist Assist Level: Moderate Assistance - Patient 50 - 74%     Upper Body Dressing/Undressing Upper body dressing   What is the patient wearing?: Button up shirt    Upper body assist Assist Level: Moderate Assistance - Patient 50 - 74%    Lower Body Dressing/Undressing Lower body dressing      What is the patient wearing?: Underwear/pull up,Pants     Lower body assist Assist for lower body dressing: Maximal Assistance - Patient 25 - 49%     Toileting Toileting    Toileting assist Assist for toileting: Moderate Assistance - Patient 50 - 74%     Transfers Chair/bed transfer  Transfers assist     Chair/bed transfer assist level: Minimal Assistance - Patient > 75%     Locomotion Ambulation   Ambulation assist      Assist level: Minimal Assistance - Patient > 75% Assistive device: Walker-rolling  Max distance: 62ft   Walk 10 feet activity   Assist     Assist level: Minimal Assistance - Patient > 75% Assistive device: Walker-rolling   Walk 50 feet activity   Assist    Assist level: Minimal Assistance - Patient > 75% Assistive device: Walker-rolling    Walk 150 feet activity   Assist Walk 150 feet activity did not occur: Safety/medical concerns  Assist level:  (nausea)      Walk 10 feet on uneven surface  activity   Assist Walk 10 feet on uneven surfaces activity did not occur: Safety/medical concerns          Wheelchair     Assist Will patient use wheelchair at discharge?: No             Wheelchair 50 feet with 2 turns activity    Assist            Wheelchair 150 feet activity     Assist           Medical Problem List and Plan: 1.  Limited by weakness and hypotension, poor posture, as well as DOE/tachycardia with activity secondary to cardiac debility.  Continue CIR 2.  Antithrombotics: -DVT/anticoagulation:  Pharmaceutical: Coumadin (goal 2-3)  Therapeutic on 1/13             -antiplatelet therapy: N/A 3. Neuropathy/Pain Management: Ultram prn for LUE pain             -- tylenol 1000 mg tid-->decrease to 650 mg tid.   Relatively controlled on meds on 1/14            Monitor with increased exertion. 4. Mood: LCSW to follow for evaluation and support.              -antipsychotic agents: N/A 5. Neuropsych: This patient is capable of making decisions on her own behalf. 6. Skin/Wound Care: Routine pressure relief measures 7. Fluids/Electrolytes/Nutrition: Strict I/Os.  8. Acute on chronic systolic CHF: Strict I/O.              Torsemide resumed 1/10              On Lipitor, Colestid, Demadex BB due to soft BP/ no ace due to to CKD. Filed Weights   06/28/2020 1545 06/17/20 0700 06/18/20 0500  Weight: 82.4 kg 81 kg 83.1 kg   Stable on 1/14 9. Acute on chronic Stage IV: Continue midodrine tid             Cr 3.31 on 1/14  Appreciate nephro recs 10. Hyponatremia: Monitor with daily checks.              Sodium 132 on 1/14  Continue to monitor 11. Leucocytosis: Resolved  Monitor for signs of infection and for fevers.              Continue to monitor 12. A fib: Monitor HR tid. On Pacerone 200 mg bid and coumadin.   Monitor with increased exertion 13. Aspiration PNA: Completed 10 day course of ceftriaxone on 1/10 14. Chest wall pain: Continue Lidoderm patches with tramadol prn.  15. VT arrest s/p ICD 1/09: On pacer precautions.  16. T2DM with hyperglycemia:  Monitor BS ac/hs. on glimepiride at home.  Continue SSI for now.   Labile on 1/14, monitor for trend             Monitor with increased mobility 17. Ulcerative colitis: On mesalamine--has been refusing Rx-->daughter to bring her med from home as  this is better tolerated.  18. Constipation:  Has not had BM since VT per family--added miralax for now and d/c once mesalamine on board (as had diarrhea SE)    Bowel meds decreased on 1/14             Adjust bowel meds as necessary 19. Anemia of chronic disease:    Fareheme per nephrology.   Monitor H/H with serial checks.               Hemoglobin 8.9 on 1/13, labs ordered for Monday  Continue to monitor 20.  Nausea and vomiting             Zofran as needed             See #18             Overall improving, see #18 21.  Hypoalbuminemia  Supplement initiated on 1/13  LOS: 2 days A FACE TO FACE EVALUATION WAS PERFORMED  Cynthia Hansen 06/18/2020, 10:08 AM

## 2020-06-18 NOTE — Progress Notes (Signed)
Physical Therapy Session Note  Patient Details  Name: Cynthia Hansen MRN: [id number] Date of Birth: [birth year]  Today's Date: 06/18/2020 PT Individual Time: 1779-3903 + 1100-1154 PT Individual Time Calculation (min): 55 min  + 54 min  Short Term Goals: Week 1:  PT Short Term Goal 1 (Week 1): STG = LTG due to ELOS  Skilled Therapeutic Interventions/Progress Updates:     1st session: Pt greeted supine in bed, RN present for morning medications, pt agreeable to therapy. No c/o pain but continues to report significant nausea. (Nausea medication included in morning medications, per RN). Donned thigh-high TED's with maxA while supine in bed for time management. She required maxA for threading tight yoga pants and totalA for donning tennis shoes. Supine<>sit with CGA with HOB elevated (unable to tolerate HOB flat due to nausea). Able to sit EOB with SBA. Sit<>stand from elevated EOB height with CGA to RW. She required modA for pulling pants over hips in standing but able to maintain standing balance with CGA. She ambulated ~73ft with CGA and RW to sink to get ready for the day with oral hygiene and self care tasks. She maintained standing for ~17min and 30 seconds while performing these tasks prior to fatigue and requesting to sit. She endorsed lightheadedness. BP assessed in sitting, reading 103/61 (74) HR 86. BP reassessed in standing, reading 123/66 (82) HR 105. She denied lightheadedness but reported fatigue..  W/c transport for time management to therapy gym. Performed gait training, x62ft with 2 turns incorporated, required CGA and RW. Cues for distant visual stabilization, postural awareness, guided breathing, and paced activity. She reports fatigue after completing in combination with nausea, deferring further functional mobility training. She was returned to her room with [place] in w/c and she remained seated in chair with chair alarm on and needs within reach.   2nd session: Pt received  sleeping in bed, awakens easily to voice, agreeable to therapy. No reports of pain, just generalized fatigue. Donned tennis shoes with totalA for time management. Supine<>sit with CGA with bed features and able to sit EOB with SBA. Pt reporting she was cold, provided her with sweater which she required minA for donning. Stand<>pivot with minA from raised EOB height to her w/c and w/c transport to ortho gym for energy conservation.  MinA for stand pivot transfer to [place]. She completed x1 minute at workload of 6, x2 minutes at workload of 4, and x2 minutes at workload of 1. Using BLE's only throughout to isolate leg strengthening. Pt reporting being very fatigued after each set and she required a very lengthy rest break for her recovery. SpO2 100% and HR 65-75 after each set. MinA for stand<>pivot back to her w/c.   Performed gait training x17ft with minA and RW, demo's downward gaze with narrow BOS and lateral unsteadiness. Would benefit from hip strengthening to improve stabilization. Performed alternating toe taps on 3 inch platform with UE support on RW, able to perform for 43 seconds prior to fatigue and requesting to sit.   She was returned to her room and performed additional stand<>pivot with minA back to bed. Required modA for removing tennis shoes and minA for sit>supine for LE management. Remained semi-reclined at end of session with needs in reach and bed alarm on.  Therapy Documentation Precautions:  Precautions Precautions: ICD/Pacemaker,Fall Precaution Comments: Monitor HR and BP. Has been orthostatic. ICD precautions (s/p 1/9) Restrictions Weight Bearing Restrictions: No  Therapy/Group: Individual Therapy  Casimir Barcellos P Dietrich Samuelson PT 06/18/2020, 7:28 AM

## 2020-06-18 NOTE — Progress Notes (Signed)
[place] for  warfarin Indication: atrial fibrillation  Allergies  Allergen Reactions  . Avandia [Rosiglitazone] Other (See Comments)    Causes muscle weakness.  . Latex Rash  . Sulfa Antibiotics Rash    Patient Measurements: Height: 5\' 8"  (172.7 cm) Weight: 83.1 kg (183 lb 3.2 oz) IBW/kg (Calculated) : 63.9 Heparin Dosing Weight: 79.3 kg  Vital Signs: Temp: 97.5 F (36.4 C) (01/14 0419) BP: 107/69 (01/14 0753) Pulse Rate: 83 (01/14 0753)  Labs: Recent Labs    06/07/2020 0039 06/17/20 0507 06/18/20 0527  HGB  --  8.9*  --   HCT  --  29.6*  --   PLT  --  319  --   LABPROT 20.9* 22.3* 23.3*  INR 1.9* 2.0* 2.2*  CREATININE 2.88* 3.01* 3.31*    Estimated Creatinine Clearance: 15.8 mL/min (A) (by C-G formula based on SCr of 3.31 mg/dL (H)).   Medical History: Past Medical History:  Diagnosis Date  . A-fib ([place])   . Acute on chronic combined systolic (congestive) and diastolic (congestive) heart failure ([place])   . Carotid artery occlusion   . DCM (dilated cardiomyopathy) ([place])    EF 30-35% by echo 05/2020  . Diabetes mellitus   . Hyperlipidemia   . Irregular heart beat   . Vitamin D deficiency      Assessment: [name] on warfarin for afib PTA now holding warfarin for [employer] Pharmacy consulted to start heparin when INR <2. Admit INR 2.8> down to 1.8.  Heparin started > heparin stopped post cath.   INR now 2.2, therapeutic, after warfarin 1 mg x2 doses. Hgb 8.9, plt 319. No s/sx of bleeding.   Warfarin PTA dose: 1.5mg  Fridays, 1mg  all other days (Last seen 05/26/20 by [facility]).   Goal of Therapy:  INR 2-3 Monitor platelets by anticoagulation protocol: Yes   Plan:  -Will order Coumadin 0.5 mg x 1 again tonight - will dose with caution, appears sensitivity increased with amiodarone. -Daily INR, CBC  [place] Crane, Vena [place], BCPS, BCCP Clinical Pharmacist  06/18/2020 1:45 PM   [facility] pharmacy phone numbers are listed on  [email or web address]

## 2020-06-18 NOTE — Progress Notes (Signed)
Pt c/o rectal pain and discomfort. Hard lump of stool noted through rectal opening. Pt required manual disimpaction for large amount of stool. Dulcolax suppository given after disimpaction. Pt is currently comfortable and resting in bed with daughter at bedside.   Gerald Stabs, RN

## 2020-06-18 NOTE — Progress Notes (Signed)
Occupational Therapy Session Note  Patient Details  Name: Cynthia Hansen MRN: [id number] Date of Birth: 03/19/1941  Today's Date: 06/18/2020 OT Individual Time: 9924-2683 OT Individual Time Calculation (min): 58 min    Short Term Goals: Week 1:  OT Short Term Goal 1 (Week 1): STG = LTG due to estimated LOS - overall supervision level for self care, basic, HM and functional transfers  Skilled Therapeutic Interventions/Progress Updates:    Pt semi upright in bed, requiring encouragement to participate during OT session.  Pt with no c/o pain but does have some nausea.  Pt completed supine to sit with supervision.  Short distance ambulation EOB to sink at RW requiring close supervision and encouragement, due to pt requesting to sit and stating "Im too tired".  Stand to sit to w/c requiring supervision.  Pt instructed/encouraged to begin UB dressing and bathing, however pt observed without initiating task, and therefore required step by step instruction to doff shirt and bathe UB which pt did complete with supervision.  Pt requesting to use bathroom but stating she cant walk there due to fatigue, therefore pt transported to bathroom via w/c and pt completed stand pivot with supervision.  Pt had continent episode of urine.  Toileting completed with supervision.  Sit to stand from 3 in 1 commode required min assist for initial power up.  Pt stating she does not want to wash LB and would like to take off pants and return to bed.  Pt doffed pants with supervision.  Stand pivot w/c to EOB with CGA.  Sit to supine with supervision.  Increased time to complete all self care and functional mobility due to pt self initiating intermittent RBs due to reported fatigue.  Vitals assessed at end of session: SPO2 100%, pulse 94 bpm.  Call bell in reach, bed alarm on.    Therapy Documentation Precautions:  Precautions Precautions: ICD/Pacemaker,Fall Precaution Comments: Monitor HR and BP. Has been orthostatic. ICD  precautions (s/p 1/9) Restrictions Weight Bearing Restrictions: No   Therapy/Group: Individual Therapy  Ezekiel Slocumb 06/18/2020, 3:50 PM

## 2020-06-19 ENCOUNTER — Other Ambulatory Visit: Payer: Self-pay | Admitting: Interventional Cardiology

## 2020-06-19 ENCOUNTER — Inpatient Hospital Stay (HOSPITAL_COMMUNITY): Payer: PPO | Admitting: Physical Therapy

## 2020-06-19 ENCOUNTER — Inpatient Hospital Stay (HOSPITAL_COMMUNITY): Payer: PPO

## 2020-06-19 LAB — BASIC METABOLIC PANEL
Anion gap: 15 (ref 5–15)
BUN: 104 mg/dL — ABNORMAL HIGH (ref 8–23)
CO2: 19 mmol/L — ABNORMAL LOW (ref 22–32)
Calcium: 8.5 mg/dL — ABNORMAL LOW (ref 8.9–10.3)
Chloride: 98 mmol/L (ref 98–111)
Creatinine, Ser: 3.32 mg/dL — ABNORMAL HIGH (ref 0.44–1.00)
GFR, Estimated: 14 mL/min — ABNORMAL LOW (ref >60)
Glucose, Bld: 165 mg/dL — ABNORMAL HIGH (ref 70–99)
Potassium: 4.8 mmol/L (ref 3.5–5.1)
Sodium: 132 mmol/L — ABNORMAL LOW (ref 135–145)

## 2020-06-19 LAB — GLUCOSE, CAPILLARY
Glucose-Capillary: 162 mg/dL — ABNORMAL HIGH (ref 70–99)
Glucose-Capillary: 234 mg/dL — ABNORMAL HIGH (ref 70–99)
Glucose-Capillary: 171 mg/dL — ABNORMAL HIGH (ref 70–99)
Glucose-Capillary: 172 mg/dL — ABNORMAL HIGH (ref 70–99)

## 2020-06-19 LAB — PROTIME-INR
INR: 2.2 — ABNORMAL HIGH (ref 0.8–1.2)
Prothrombin Time: 24.1 seconds — ABNORMAL HIGH (ref 11.4–15.2)

## 2020-06-19 LAB — MAGNESIUM: Magnesium: 2.7 mg/dL — ABNORMAL HIGH (ref 1.7–2.4)

## 2020-06-19 MED ORDER — MELATONIN 5 MG PO TABS
5.0000 mg | ORAL_TABLET | Freq: Every day | ORAL | Status: DC
  Administered 2020-06-19: 5 mg via ORAL
  Filled 2020-06-19: qty 1

## 2020-06-19 MED ORDER — HYDROCORTISONE ACETATE 25 MG RE SUPP
25.0000 mg | Freq: Two times a day (BID) | RECTAL | Status: DC
  Administered 2020-06-19: 25 mg via RECTAL
  Filled 2020-06-19 (×4): qty 1

## 2020-06-19 MED ORDER — WARFARIN 0.5 MG HALF TABLET
0.5000 mg | ORAL_TABLET | Freq: Once | ORAL | Status: AC
  Administered 2020-06-19: 0.5 mg via ORAL
  Filled 2020-06-19: qty 1

## 2020-06-19 MED ORDER — WITCH HAZEL-GLYCERIN EX PADS
MEDICATED_PAD | CUTANEOUS | Status: DC | PRN
  Filled 2020-06-19: qty 100

## 2020-06-19 MED ORDER — MAGNESIUM CITRATE PO SOLN
1.0000 | Freq: Once | ORAL | Status: AC
  Administered 2020-06-19: 1 via ORAL
  Filled 2020-06-19: qty 296

## 2020-06-19 NOTE — Progress Notes (Signed)
[place] PHYSICAL MEDICINE & REHABILITATION PROGRESS NOTE  Subjective/Complaints: Constipated- mag citrate ordered today and she is agreeable +nausea, understands this may be due to constipation +hemorrhoidal pain- anusol and witch hazel pads ordered Wild dreams at night that are disturbing to her- cannot give prazosin given soft BP- agreeable to melatonin increase  ROS: + Constipation, shortness of breath, wild dreams.  Denies CP, N/V/D  Objective: Vital Signs: Blood pressure 109/67, pulse 95, temperature 97.7 F (36.5 C), resp. rate 20, height 5\' 8"  (1.727 m), weight 81 kg, SpO2 99 %. No results found. Recent Labs    06/17/20 0507  WBC 9.5  HGB 8.9*  HCT 29.6*  PLT 319   Recent Labs    06/18/20 0527 06/19/20 0549  NA 132* 132*  K 4.4 4.8  CL 98 98  CO2 20* 19*  GLUCOSE 143* 165*  BUN 99* 104*  CREATININE 3.31* 3.32*  CALCIUM 8.5* 8.5*    Intake/Output Summary (Last 24 hours) at 06/19/2020 1054 Last data filed at 06/18/2020 1300 Gross per 24 hour  Intake 311 ml  Output --  Net 311 ml        Physical Exam: BP 109/67 (BP Location: Right Arm)   Pulse 95   Temp 97.7 F (36.5 C)   Resp 20   Ht 5\' 8"  (1.727 m)   Wt 81 kg   SpO2 99%   BMI 27.15 kg/m  Gen: no distress, normal appearing HEENT: oral mucosa pink and moist, NCAT Cardio:Irregular rhythm Chest: normal effort, normal rate of breathing Abd: soft, non-distended Ext: no edema Psych: pleasant, normal affect Skin: Warm and dry.  Scattered ecchymosis.  Dressing CDI. Psych: Normal mood.  Normal behavior. Musc: No edema in extremities.  No tenderness in extremities. Neuro: Alert Motor: 4+/5 throughout, stable Sensation diminished to light touch distal to bilateral knees   Assessment/Plan: 1. Functional deficits which require 3+ hours per day of interdisciplinary therapy in a comprehensive inpatient rehab setting.  Physiatrist is providing close team supervision and 24 hour management of active  medical problems listed below.  Physiatrist and rehab team continue to assess barriers to discharge/monitor patient progress toward functional and medical goals   Care Tool:  Bathing    Body parts bathed by patient: Right arm,Left arm,Chest,Abdomen,Front perineal area,Right upper leg,Left upper leg,Face   Body parts bathed by helper: Buttocks,Right lower leg,Left lower leg     Bathing assist Assist Level: Moderate Assistance - Patient 50 - 74%     Upper Body Dressing/Undressing Upper body dressing   What is the patient wearing?: Button up shirt    Upper body assist Assist Level: Moderate Assistance - Patient 50 - 74%    Lower Body Dressing/Undressing Lower body dressing      What is the patient wearing?: Underwear/pull up,Pants     Lower body assist Assist for lower body dressing: Maximal Assistance - Patient 25 - 49%     Toileting Toileting    Toileting assist Assist for toileting: Moderate Assistance - Patient 50 - 74%     Transfers Chair/bed transfer  Transfers assist     Chair/bed transfer assist level: Minimal Assistance - Patient > 75%     Locomotion Ambulation   Ambulation assist      Assist level: Minimal Assistance - Patient > 75% Assistive device: Walker-rolling Max distance: 16ft   Walk 10 feet activity   Assist     Assist level: Minimal Assistance - Patient > 75% Assistive device: Walker-rolling   Walk 50  feet activity   Assist    Assist level: Minimal Assistance - Patient > 75% Assistive device: Walker-rolling    Walk 150 feet activity   Assist Walk 150 feet activity did not occur: Safety/medical concerns  Assist level:  (nausea)      Walk 10 feet on uneven surface  activity   Assist Walk 10 feet on uneven surfaces activity did not occur: Safety/medical concerns         Wheelchair     Assist Will patient use wheelchair at discharge?: No             Wheelchair 50 feet with 2 turns  activity    Assist            Wheelchair 150 feet activity     Assist           Medical Problem List and Plan: 1.  Limited by weakness and hypotension, poor posture, as well as DOE/tachycardia with activity secondary to cardiac debility.  Continue CIR 2.  Antithrombotics: -DVT/anticoagulation:  Pharmaceutical: Coumadin (goal 2-3)  Therapeutic on 1/13             -antiplatelet therapy: N/A 3. Neuropathy/Pain Management: Ultram prn for LUE pain             -- tylenol 1000 mg tid-->decrease to 650 mg tid.   Relatively controlled on meds on 1/14            Monitor with increased exertion. 4. Mood: LCSW to follow for evaluation and support.              -antipsychotic agents: N/A 5. Neuropsych: This patient is capable of making decisions on her own behalf. 6. Skin/Wound Care: Routine pressure relief measures 7. Fluids/Electrolytes/Nutrition: Strict I/Os.  8. Acute on chronic systolic CHF: Strict I/O.              Torsemide resumed 1/10              On Lipitor, Colestid, Demadex BB due to soft BP/ no ace due to to CKD. Filed Weights   06/17/20 0700 06/18/20 0500 06/19/20 0557  Weight: 81 kg 83.1 kg 81 kg   Stable on 1/14, weight down to 81kg on 1/15 9. Acute on chronic Stage IV: Continue midodrine tid             Cr 3.31 on 1/14, 3.32 on 1/15  Appreciate nephro recs 10. Hyponatremia: Monitor with daily checks.              Sodium 132 on 1/14 and 1/15  Continue to monitor 11. Leucocytosis: Resolved  Monitor for signs of infection and for fevers.              Continue to monitor 12. A fib: Monitor HR tid. On Pacerone 200 mg bid and coumadin.   Monitor with increased exertion 13. Aspiration PNA: Completed 10 day course of ceftriaxone on 1/10 14. Chest wall pain: Continue Lidoderm patches with tramadol prn.  15. VT arrest s/p ICD 1/09: On pacer precautions.  16. T2DM with hyperglycemia: Monitor BS ac/hs. on glimepiride at home.  Continue SSI for now.   Labile on  1/14, monitor for trend             Monitor with increased mobility 17. Ulcerative colitis: On mesalamine--has been refusing Rx-->daughter to bring her med from home as this is better tolerated.  18. Constipation:  Has not had BM since VT per family--added miralax for now and  d/c once mesalamine on board (as had diarrhea SE)    Bowel meds decreased on 1/14             Adjust bowel meds as necessary  1/15: mag citrate ordered for constipation that is contributing to nausea and probably worsening hemorrhoids 19. Anemia of chronic disease:    Fareheme per nephrology.   Monitor H/H with serial checks.               Hemoglobin 8.9 on 1/13, labs ordered for Monday  Continue to monitor 20.  Nausea and vomiting             Zofran as needed             See #18             Overall improving, see #18 21.  Hypoalbuminemia  Supplement initiated on 1/13 22. Painful hemorroids: Anusol and witch hazel pads added.  [place] dreams bothersome to her: cannot give Prazosin since BP soft, discussed increasing Melatonin to 5mg  and she is agreeable  LOS: 3 days A FACE TO FACE EVALUATION WAS PERFORMED  Cynthia Hansen P Cynthia Hansen 06/19/2020, 10:54 AM

## 2020-06-19 NOTE — Progress Notes (Signed)
Occupational Therapy Session Note  Patient Details  Name: Cynthia Hansen MRN: [id number] Date of Birth: 08/16/1940  Today's Date: 06/19/2020 OT Individual Time: 0950-1005 OT Individual Time Calculation (min): 15 min  and Today's Date: 06/19/2020 OT Missed Time: 60 Minutes Missed Time Reason: Patient fatigue   Short Term Goals: Week 1:  OT Short Term Goal 1 (Week 1): STG = LTG due to estimated LOS - overall supervision level for self care, basic, HM and functional transfers  Skilled Therapeutic Interventions/Progress Updates:    Pt supine with c/o nausea and fatigue from earlier toileting session with nursing staff where she had near-syncopal episode. Discussed/edu activity tolerance and d/c planning. Pt completed oral care at bed level with set up assist. New emesis bag retrieved d/t pt feeling nauseas but no emesis occurred during session. Offered further activity and pt declined. 60 min missed.   Therapy Documentation Precautions:  Precautions Precautions: ICD/Pacemaker,Fall Precaution Comments: Monitor HR and BP. Has been orthostatic. ICD precautions (s/p 1/9) Restrictions Weight Bearing Restrictions: No  Therapy/Group: Individual Therapy  Curtis Sites 06/19/2020, 7:56 AM

## 2020-06-19 NOTE — Progress Notes (Signed)
Advanced Heart Failure Rounding Note  PCP-Cardiologist: Larae Grooms, MD   Subjective:    Continues to c/o nausea, weakness, constipation, hemorrhoidal pains. Appetite poor  Diuretics have been on hold. Creatinine 2.8>3.0>3.3>3.3. Weight stable at 178   No orthopnea or PND.    Objective:   Weight Range: 81 kg Body mass index is 27.15 kg/m.   Vital Signs:   Temp:  [97.4 F (36.3 C)-97.7 F (36.5 C)] 97.7 F (36.5 C) (01/15 0353) Pulse Rate:  [74-95] 95 (01/15 0816) Resp:  [16-20] 20 (01/15 0353) BP: (96-136)/(61-77) 109/67 (01/15 0816) SpO2:  [98 %-100 %] 99 % (01/15 0816) Weight:  [81 kg] 81 kg (01/15 0557) Last BM Date: (P) 06/19/20  Weight change: Filed Weights   06/17/20 0700 06/18/20 0500 06/19/20 0557  Weight: 81 kg 83.1 kg 81 kg    Intake/Output:   Intake/Output Summary (Last 24 hours) at 06/19/2020 1133 Last data filed at 06/18/2020 1300 Gross per 24 hour  Intake 311 ml  Output --  Net 311 ml      Physical Exam    General:  Lying in bed  No resp difficulty HEENT: normal Neck: supple. JVP 7. Carotids 2+ bilat; no bruits. No lymphadenopathy or thryomegaly appreciated. Cor: PMI nondisplaced. Regular rate & rhythm. No rubs, gallops or murmurs. ICD site ok  Lungs: clear Abdomen: soft, nontender, nondistended. No hepatosplenomegaly. No bruits or masses. Good bowel sounds. Extremities: no cyanosis, clubbing, rash, edema Neuro: alert & orientedx3, cranial nerves grossly intact. moves all 4 extremities w/o difficulty. Affect pleasant   Labs    CBC Recent Labs    06/17/20 0507  WBC 9.5  NEUTROABS 7.4  HGB 8.9*  HCT 29.6*  MCV 100.7*  PLT 229   Basic Metabolic Panel Recent Labs    06/18/20 0527 06/19/20 0549  NA 132* 132*  K 4.4 4.8  CL 98 98  CO2 20* 19*  GLUCOSE 143* 165*  BUN 99* 104*  CREATININE 3.31* 3.32*  CALCIUM 8.5* 8.5*  MG 2.9* 2.7*   Liver Function Tests Recent Labs    06/17/20 0507  AST 13*  ALT 11   ALKPHOS 104  BILITOT 0.6  PROT 5.9*  ALBUMIN 2.7*   No results for input(s): LIPASE, AMYLASE in the last 72 hours. Cardiac Enzymes No results for input(s): CKTOTAL, CKMB, CKMBINDEX, TROPONINI in the last 72 hours.  BNP: BNP (last 3 results) Recent Labs    05/27/20 0828 06/02/20 1832  BNP 2,213.1* 2,338.9*    ProBNP (last 3 results) Recent Labs    05/13/20 1550 05/31/20 0939  PROBNP 46,357* 48,555*     D-Dimer No results for input(s): DDIMER in the last 72 hours. Hemoglobin A1C No results for input(s): HGBA1C in the last 72 hours. Fasting Lipid Panel No results for input(s): CHOL, HDL, LDLCALC, TRIG, CHOLHDL, LDLDIRECT in the last 72 hours. Thyroid Function Tests No results for input(s): TSH, T4TOTAL, T3FREE, THYROIDAB in the last 72 hours.  Invalid input(s): FREET3  Other results:   Imaging    No results found.   Medications:     Scheduled Medications: . (feeding supplement) PROSource Plus  30 mL Oral BID BM  . acetaminophen  650 mg Oral TID  . amiodarone  200 mg Oral Daily  . atorvastatin  80 mg Oral Daily  . colestipol  1 g Oral BID  . hydrocortisone  25 mg Rectal BID  . insulin aspart  0-5 Units Subcutaneous QHS  . insulin aspart  0-9 Units  Subcutaneous TID WC  . lidocaine  2 patch Transdermal Q24H  . magnesium citrate  1 Bottle Oral Once  . melatonin  5 mg Oral QHS  . mesalamine  4.8 g Oral Q breakfast  . methimazole  5 mg Oral Daily  . midodrine  10 mg Oral TID WC  . polyethylene glycol  17 g Oral BID  . Ensure Max Protein  11 oz Oral BID  . senna-docusate  1 tablet Oral QHS  . warfarin  0.5 mg Oral ONCE-1600  . Warfarin - Pharmacist Dosing Inpatient   Does not apply q1600    Infusions: . ferumoxytol      PRN Medications: acetaminophen, alum & mag hydroxide-simeth, bisacodyl, diphenhydrAMINE, guaiFENesin-dextromethorphan, hydrocortisone cream, ondansetron (ZOFRAN) IV, polyethylene glycol, prochlorperazine **OR** prochlorperazine  **OR** prochlorperazine, sodium phosphate, traMADol, traZODone, witch hazel-glycerin    Assessment/Plan   1. Acute on chronic systolic CHF: Cardiolite in 11/21 with EF 26%, inferior fixed perfusion defect with no ischemia. Echo in 12/21 with EF 35-40%, moderate LV dilation, mildly decreased RV function. Cause of cardiomyopathy uncertain but there is concern for ischemic cardiomyopathy. Cardiac MRI was difficult study, EF 25-30% estimated without definitive LGE. Worsening CHF recently complicated by CKD stage IV. She has had NYHA class III symptoms. She diuresed well initially but had VT arrest. RHC this admit w/ mildly elevated filling pressures, mild pulmonary HTN and relatively preserved CO.  - Bump in Scr from 2.88>>3.02>>3.3. Stable today at 3.3. Weight stable. Continue to hold diuretics  - Coreg on hold for hypotension and dapagliflozin stopped with AKI.  -c/w midodrine 10mg  tidto keep SBP persistently > 165for renal perfusion - Will be limited in terms of HF meds we can use with CKD 4, history of hyperkalemia, and soft BP. For now, no ARNI/ARB/digoxin/spironolactone.  - Will be [place] candidate (can initiate as outpatient)  - Ideally, would have coronary angiography to look for CAD as cause of cardiomyopathy and VT. However, not candidate at this time in absence of ACS given CKDAKI.  -Remains very tenuous with cardiorenal syndrome. Will continue to hold diuretics and hopefully renal function will continue to improve.  She is not candidate for advanced HF therapies or HD. If renal function continues to deteriorate will likely need Hospice care.   2. AKI on CKD Stage IV: RHC showed relatively preserved cardiac output.SCr bump per above.  - Baseline creatinine 2.1-2.2 - Bump in Scr from 2.88>>3.02>>3.3. Stable today at 3.3. BUN 104. Weight stable. Continue to hold diuretics  - Suspect mild uremia contributing to nausea - c/wmidodrine 10 mg tid keep SBP >117for renal perfusion -  Nephrology following as well  3. Chronic atrial fibrillation: This appears permanent, x years. Has biatrial severe enlargement and unlikely to hold NSR with DCCV attempt. INR stable at 2.2 today - Rate controlled. Continue warfarin. Dosing per pharmacy.   4. Low TSH: Concern for hyperthyroidism. Free T4 is high, but free T3 is normal. Thyroid stimulating antibody negative  - Discussed abnormal TFTs with endocrinology, started on low dose methimazole 5 mg qam followup with endocrinology as outpatient.  5. CAD: NSTEMI with HS-TnI up to 7990 post-VT arrest and CPR. As above, have been concerned for CAD as cause of cardiomyopathy. ?Rise in troponin due to demand ischemia versus ACS =>no ischemic pain (just chest wall tenderness post-CPR) and no ECG change (old ASMI as in past).  - Continue atorvastatin 80 mg daily.  - INR threapeutic at 2.2.  - Ideally, would do coronary angiography but renal dysfunction  prhibits - cMRI w/ poor imaging, Very difficult delayed enhancement images. Possible subtle subendocardial LGE in the mid inferior wall seen on the short axis images, but this could not be confirmed in the long axis images. There does not appear to be a large amount of scar.  6. Cardiac arrest: Pulseless VT. Electrolytes were stable. No chest pain or dyspnea prior. ?Cause =>suspect scar-mediated.  - Quiescent on amio 200 daily (cut back due to nausea) - S/p ICD placement 1/9.   7. Pulmonary: RLL PNA post-arrest by CXR. Suspect aspiration.  - Resolved. Completed course of ceftriaxone. AF. WBC normal   8. Pleural Effusion: S/P R Thoracentesis with 700 cc removed. Transudate from CHF. - repeat CXR 1/10 w/ small bilateral pleural effusions   9. Hyponatremia: improving, Na 132  10. Anemia: Likely Anemia of chronic disease from CKD. No overt GI bleeding but has Fe deficiency.  - FOBT negative.  - Received 2 doses of feraheme.    11 Deconditioning - now in [place] team  appreciated.   HF team will see again Monday. Call with questions.    Length of Stay: 3  Glori Bickers, MD  06/19/2020, 11:33 AM  Advanced Heart Failure Team Pager [phone] (M-F; 7a - 4p)  Please contact [facility] for night-coverage after hours (4p -7a ) and weekends on [email or web address]

## 2020-06-19 NOTE — Progress Notes (Addendum)
[place] for  warfarin Indication: atrial fibrillation  Allergies  Allergen Reactions  . Avandia [Rosiglitazone] Other (See Comments)    Causes muscle weakness.  . Latex Rash  . Sulfa Antibiotics Rash    Patient Measurements: Height: 5\' 8"  (172.7 cm) Weight: 81 kg (178 lb 9.2 oz) IBW/kg (Calculated) : 63.9 Heparin Dosing Weight: 79.3 kg  Vital Signs: Temp: 97.7 F (36.5 C) (01/15 0353) Temp Source: Oral (01/14 1940) BP: 96/61 (01/15 0557) Pulse Rate: 74 (01/15 0557)  Labs: Recent Labs    06/17/20 0507 06/18/20 0527 06/19/20 0549  HGB 8.9*  --   --   HCT 29.6*  --   --   PLT 319  --   --   LABPROT 22.3* 23.3* 24.1*  INR 2.0* 2.2* 2.2*  CREATININE 3.01* 3.31* 3.32*    Estimated Creatinine Clearance: 15.6 mL/min (A) (by C-G formula based on SCr of 3.32 mg/dL (H)).   Medical History: Past Medical History:  Diagnosis Date  . A-fib ([place])   . Acute on chronic combined systolic (congestive) and diastolic (congestive) heart failure ([place])   . Carotid artery occlusion   . DCM (dilated cardiomyopathy) ([place])    EF 30-35% by echo 05/2020  . Diabetes mellitus   . Hyperlipidemia   . Irregular heart beat   . Vitamin D deficiency      Assessment: [name] on warfarin for afib PTA now holding warfarin for [facility]. Pharmacy consulted to start heparin when INR <2. Admit INR 2.8> down to 1.8.  Heparin started > heparin stopped post cath.   Today's INR stable at 2.2, therapeutic, after warfarin 1 mg x2 doses and 0.5 mg x2. No CBC today, yesterday's Hgb 8.9, plt 319. No s/sx of bleeding. CBC ordered for tomorrow.  Warfarin PTA dose: 1.5mg  Fridays, 1mg  all other days (Last seen 05/26/20 by [facility]).   Goal of Therapy:  INR 2-3 Monitor platelets by anticoagulation protocol: Yes   Plan:  -Will order Coumadin 0.5 mg x 1 again tonight - will dose with caution, appears sensitivity increased with amiodarone. -Daily INR, CBC  Fara Olden,  PharmD PGY-1 Pharmacy Resident 06/19/2020 7:34 AM Please see AMION for all pharmacy numbers

## 2020-06-19 NOTE — Progress Notes (Signed)
[facility] Progress Note   Assessment/ Plan:   1. AKI on CKD Stage IV - Baseline appears 1.8-2.3. AKI 2/2 ATN w/ arrest but also related to CHF and relative hypotension leading to decrease perfusion. Cr stable at 3.3. Concerned for her bout of nausea and with a BUN of 104. If clinically worsens, then recommend a palliative care consultation to address her goals of care in depth. At the moment, she is not a candidate for renal replacement therapy especially given her underlying cardiac condition and poor functionality - midodrine 10mg  tid - Continue holding Farxiga and ACEI/ARB -continue to hold torsemide, still looks clinically dry. Hydrate to thirst 2. Acute systolic CHF w/ cardiac arrest: TTE 12/21 with EF 35% - 40% - AHF following. Appreciate recommendations - s/p ICD 1/9 -hold torsemide as above 3. Iron Deficiency anemia: CKD may be contributing. S/p feraheme x1 and redose next week. 4. Hyponatremia: improved 5. Nutrition:Low sodium diet   Dispo: CIR  Subjective:   Cynthia Hansen is a 80 y.o. with PMH of Afib, DM II, HTN, h/o CEA and new dx of dilated cardiomyopathy with EF 30-35% who is seen in consultation for AKI on CKD. She is on hospital day 3.  Seen earlier in AM.cr stable at 3.3. had a slightly vasovagal episode when on the commode per staff. She does endorse some nausea but is keeping down liquids easily. Has been drinking ensure. She reports feeling dehydrated.   Objective:   BP 109/67 (BP Location: Right Arm)   Pulse 95   Temp 97.7 F (36.5 C)   Resp 20   Ht 5\' 8"  (1.727 m)   Wt 81 kg   SpO2 99%   BMI 27.15 kg/m   Physical Exam: TIW:PYKDX in bed, NAD CVS: normal rate, no murmur  Resp: Normal work of breathing , bilateral chest rise Abd: Soft, NT Ext: No edema  Labs: BMET Recent Labs  Lab 06/13/20 0105 06/14/20 0050 06/15/20 0036 06/11/2020 0039 06/17/20 0507 06/18/20 0527 06/19/20 0549  NA 134* 133* 133* 132* 134* 132* 132*  K 4.4  4.3 4.4 4.4 4.5 4.4 4.8  CL 101 101 98 98 99 98 98  CO2 20* 19* 23 21* 21* 20* 19*  GLUCOSE 155* 255* 215* 232* 163* 143* 165*  BUN 77* 79* 82* 89* 91* 99* 104*  CREATININE 2.85* 2.72* 2.73* 2.88* 3.01* 3.31* 3.32*  CALCIUM 8.8* 8.3* 8.6* 8.4* 8.5* 8.5* 8.5*   CBC Recent Labs  Lab 06/13/20 0105 06/14/20 0050 06/15/20 0036 06/17/20 0507  WBC 9.7 11.6* 11.7* 9.5  NEUTROABS  --   --   --  7.4  HGB 8.7* 8.6* 8.6* 8.9*  HCT 26.9* 28.8* 28.7* 29.6*  MCV 93.1 97.6 97.3 100.7*  PLT 298 287 312 319      Medications:    . (feeding supplement) PROSource Plus  30 mL Oral BID BM  . acetaminophen  650 mg Oral TID  . amiodarone  200 mg Oral Daily  . atorvastatin  80 mg Oral Daily  . colestipol  1 g Oral BID  . hydrocortisone  25 mg Rectal BID  . insulin aspart  0-5 Units Subcutaneous QHS  . insulin aspart  0-9 Units Subcutaneous TID WC  . lidocaine  2 patch Transdermal Q24H  . melatonin  5 mg Oral QHS  . mesalamine  4.8 g Oral Q breakfast  . methimazole  5 mg Oral Daily  . midodrine  10 mg Oral TID WC  . polyethylene glycol  17 g Oral BID  . Ensure Max Protein  11 oz Oral BID  . senna-docusate  1 tablet Oral QHS  . warfarin  0.5 mg Oral ONCE-1600  . Warfarin - Pharmacist Dosing Inpatient   Does not apply [id number]     TIRWE [id number]  06/19/2020, 1:05 PM

## 2020-06-19 NOTE — Progress Notes (Signed)
Physical Therapy Session Note  Patient Details  Name: Cynthia Hansen MRN: [id number] Date of Birth: 10/02/1940  Today's Date: 06/19/2020 PT Individual Time: 6553-7482  PT Individual Time Calculation (min): 10 min   and  Today's Date: 06/19/2020 PT Missed Time: 75 Minutes and 35 minutes Missed Time Reason: Patient ill (Comment);Patient fatigue  Short Term Goals: Week 1:  PT Short Term Goal 1 (Week 1): STG = LTG due to ELOS  Skilled Therapeutic Interventions/Progress Updates:    Session 1: Pt received supine in bed, HOB elevated, awake and upon arrival pt reports she is doing "terrible." States she couldn't sleep last night and has continued to have nausea with constipation despite reporting having a BM today. Reports earlier with nursing staff she was able to transfer to [facility] but became so fatigued she couldn't even hold her head up requiring +2 assistance to return safely to bed. Pt states "I haven't felt like doing anything today, not even move." Pt politely and apologetically requests to defer therapy at this time. Pt denies any needs at this time and left in bed with needs in reach and bed alarm on. Missed 75 minutes of skilled physical therapy.  Session 2: Pt received supine in bed with her daughter present and pt continues to report significant fatigue and nausea. Pt declines OOB mobility; however, therapy session focused on pt/family education with pt's daughter expressing many concerns. She inquires about pt's sudden onset of 'weakness' while nursing staff assisting pt to the [facility] this AM - therapist reports that due to not witnessing event unable to hypothesize possible causes of this - pt now states that she was dizzy during the episode - notified [place], RN of this report and requested RN asesss pt's orthostatic BP. Pt's daughter reports that in the past when pt has experienced fluid overload she had sudden onset confusion with pt's daughter reporting pt has no baseline cognitive  impairments. Pt's daughter reports pt was very social and independent prior to hospitalization and pt's daughter reports she has become emotional about pt's CLOF. Therapist encouraged pt/daughter to continue advocating for pt and inquiring to MDs regarding pt's medical progress. Therapist spent an additional 24 minutes of un-billable time in pt's room to provide emotional support to pt and daughter - requested Dr. Ranell Patrick discuss with Dr. Posey Pronto regarding him having a conversation with pt and family regarding pt's medical status. Pt left supine in bed with needs in reach, bed alarm on, and pt's daughter present. Missed 35 minutes of skilled physical therapy.  Therapy Documentation Precautions:  Precautions Precautions: ICD/Pacemaker,Fall Precaution Comments: Monitor HR and BP. Has been orthostatic. ICD precautions (s/p 1/9) Restrictions Weight Bearing Restrictions: (P) No  Therapy/Group: Individual Therapy  Tawana Scale , PT, DPT, CSRS  06/19/2020, 12:19 PM

## 2020-06-20 LAB — BASIC METABOLIC PANEL
Anion gap: 15 (ref 5–15)
BUN: 122 mg/dL — ABNORMAL HIGH (ref 8–23)
CO2: 19 mmol/L — ABNORMAL LOW (ref 22–32)
Calcium: 8.2 mg/dL — ABNORMAL LOW (ref 8.9–10.3)
Chloride: 97 mmol/L — ABNORMAL LOW (ref 98–111)
Creatinine, Ser: 3.61 mg/dL — ABNORMAL HIGH (ref 0.44–1.00)
GFR, Estimated: 12 mL/min — ABNORMAL LOW (ref >60)
Glucose, Bld: 143 mg/dL — ABNORMAL HIGH (ref 70–99)
Potassium: 5 mmol/L (ref 3.5–5.1)
Sodium: 131 mmol/L — ABNORMAL LOW (ref 135–145)

## 2020-06-20 LAB — CBC
HCT: 27.5 % — ABNORMAL LOW (ref 36.0–46.0)
Hemoglobin: 8.9 g/dL — ABNORMAL LOW (ref 12.0–15.0)
MCH: 31.3 pg (ref 26.0–34.0)
MCHC: 32.4 g/dL (ref 30.0–36.0)
MCV: 96.8 fL (ref 80.0–100.0)
Platelets: 345 10*3/uL (ref 150–400)
RBC: 2.84 MIL/uL — ABNORMAL LOW (ref 3.87–5.11)
RDW: 19.4 % — ABNORMAL HIGH (ref 11.5–15.5)
WBC: 13.4 10*3/uL — ABNORMAL HIGH (ref 4.0–10.5)
nRBC: 1.1 % — ABNORMAL HIGH (ref 0.0–0.2)

## 2020-06-20 LAB — MAGNESIUM: Magnesium: 3.2 mg/dL — ABNORMAL HIGH (ref 1.7–2.4)

## 2020-06-20 LAB — GLUCOSE, CAPILLARY
Glucose-Capillary: 154 mg/dL — ABNORMAL HIGH (ref 70–99)
Glucose-Capillary: 211 mg/dL — ABNORMAL HIGH (ref 70–99)
Glucose-Capillary: 157 mg/dL — ABNORMAL HIGH (ref 70–99)
Glucose-Capillary: 158 mg/dL — ABNORMAL HIGH (ref 70–99)

## 2020-06-20 LAB — PROTIME-INR
INR: 2.3 — ABNORMAL HIGH (ref 0.8–1.2)
Prothrombin Time: 24.3 seconds — ABNORMAL HIGH (ref 11.4–15.2)

## 2020-06-20 MED ORDER — SODIUM CHLORIDE 0.9 % IV SOLN: INTRAVENOUS | Status: AC

## 2020-06-20 MED ORDER — WARFARIN 0.5 MG HALF TABLET
0.5000 mg | ORAL_TABLET | Freq: Once | ORAL | Status: AC
  Administered 2020-06-20: 0.5 mg via ORAL
  Filled 2020-06-20: qty 1

## 2020-06-20 MED ORDER — LIDOCAINE HCL URETHRAL/MUCOSAL 2 % EX GEL: 1.0000 "application " | Freq: Three times a day (TID) | CUTANEOUS | Status: DC | PRN

## 2020-06-20 MED ORDER — MELATONIN 5 MG PO TABS
10.0000 mg | ORAL_TABLET | Freq: Every day | ORAL | Status: DC
  Administered 2020-06-20: 10 mg via ORAL
  Filled 2020-06-20: qty 2

## 2020-06-20 NOTE — Progress Notes (Signed)
Cynthia Hansen PHYSICAL MEDICINE & REHABILITATION PROGRESS NOTE  Subjective/Complaints: Was able to have multiple BM with mag citrate. Continues to have hemorrhoidal pain despite anusol and witch hazel pads.   ROS: Constipation improved with mag citrate, shortness of breath, wild dreams, +pain with urinary cath. Denies CP, N/V/D  Objective: Vital Signs: Blood pressure 95/63, pulse 85, temperature 97.7 F (36.5 C), resp. rate 20, height 5\' 8"  (1.727 m), weight 82.4 kg, SpO2 99 %. No results found. Recent Labs    06/20/20 0458  WBC 13.4*  HGB 8.9*  HCT 27.5*  PLT 345   Recent Labs    06/19/20 0549 06/20/20 0458  NA 132* 131*  K 4.8 5.0  CL 98 97*  CO2 19* 19*  GLUCOSE 165* 143*  BUN 104* 122*  CREATININE 3.32* 3.61*  CALCIUM 8.5* 8.2*    Intake/Output Summary (Last 24 hours) at 06/20/2020 1101 Last data filed at 06/20/2020 0650 Gross per 24 hour  Intake 100 ml  Output 650 ml  Net -550 ml        Physical Exam: BP 95/63 (BP Location: Left Arm)   Pulse 85   Temp 97.7 F (36.5 C)   Resp 20   Ht 5\' 8"  (1.727 m)   Wt 82.4 kg   SpO2 99%   BMI 27.62 kg/m  Gen: no distress, normal appearing HEENT: oral mucosa pink and moist, NCAT Cardio: Reg rate Chest: normal effort, normal rate of breathing Abd: soft, non-distended Ext: no edema Psych: pleasant, normal affect Skin: Warm and dry.  Scattered ecchymosis.  Dressing CDI. Psych: Normal mood.  Normal behavior. Musc: No edema in extremities.  No tenderness in extremities. Neuro: Alert Motor: 4+/5 throughout, stable Sensation diminished to light touch distal to bilateral knees   Assessment/Plan: 1. Functional deficits which require 3+ hours per day of interdisciplinary therapy in a comprehensive inpatient rehab setting.  Physiatrist is providing close team supervision and 24 hour management of active medical problems listed below.  Physiatrist and rehab team continue to assess barriers to discharge/monitor patient  progress toward functional and medical goals   Care Tool:  Bathing    Body parts bathed by patient: Right arm,Left arm,Chest,Abdomen,Front perineal area,Right upper leg,Left upper leg,Face   Body parts bathed by helper: Buttocks,Right lower leg,Left lower leg     Bathing assist Assist Level: Moderate Assistance - Patient 50 - 74%     Upper Body Dressing/Undressing Upper body dressing   What is the patient wearing?: Button up shirt    Upper body assist Assist Level: Moderate Assistance - Patient 50 - 74%    Lower Body Dressing/Undressing Lower body dressing      What is the patient wearing?: Underwear/pull up,Pants     Lower body assist Assist for lower body dressing: Maximal Assistance - Patient 25 - 49%     Toileting Toileting    Toileting assist Assist for toileting: Moderate Assistance - Patient 50 - 74%     Transfers Chair/bed transfer  Transfers assist     Chair/bed transfer assist level: Minimal Assistance - Patient > 75%     Locomotion Ambulation   Ambulation assist      Assist level: Minimal Assistance - Patient > 75% Assistive device: Walker-rolling Max distance: 51ft   Walk 10 feet activity   Assist     Assist level: Minimal Assistance - Patient > 75% Assistive device: Walker-rolling   Walk 50 feet activity   Assist    Assist level: Minimal Assistance - Patient >  75% Assistive device: Walker-rolling    Walk 150 feet activity   Assist Walk 150 feet activity did not occur: Safety/medical concerns  Assist level:  (nausea)      Walk 10 feet on uneven surface  activity   Assist Walk 10 feet on uneven surfaces activity did not occur: Safety/medical concerns         Wheelchair     Assist Will patient use wheelchair at discharge?: No             Wheelchair 50 feet with 2 turns activity    Assist            Wheelchair 150 feet activity     Assist           Medical Problem List and  Plan: 1.  Limited by weakness and hypotension, poor posture, as well as DOE/tachycardia with activity secondary to cardiac debility.  Continue CIR 2.  Antithrombotics: -DVT/anticoagulation:  Pharmaceutical: Coumadin (goal 2-3) Therapeutic on 1/16, monitor daily.             -antiplatelet therapy: N/A 3. Neuropathy/Pain Management: Ultram prn for LUE pain             -- tylenol 1000 mg tid-->decrease to 650 mg tid.   Relatively controlled on meds on 1/14            Monitor with increased exertion. 4. Mood: LCSW to follow for evaluation and support.              -antipsychotic agents: N/A 5. Neuropsych: This patient is capable of making decisions on her own behalf. 6. Skin/Wound Care: Routine pressure relief measures 7. Fluids/Electrolytes/Nutrition: Strict I/Os.  8. Acute on chronic systolic CHF: Strict I/O.              Torsemide resumed 1/10              On Lipitor, Colestid, Demadex   BB due to soft BP/ no ace due to to CKD.  1/16: Appreciate f/u by HF team, not reviewed. Diuretics on hold given rising Cr. Weight up as a result. Filed Weights   06/18/20 0500 06/19/20 0557 06/20/20 0500  Weight: 83.1 kg 81 kg 82.4 kg   Stable on 1/14, weight down to 81kg on 1/15 9. Acute on chronic Stage IV: Continue midodrine tid             Cr 3.31 on 1/14, 3.32 on 1/15  Appreciate nephro recs- patient not a candidate for CVVHD or long-term HD. Nephrology and HF recommend palliative care involvement. Consult has been placed. Daughter has been updated by Dr. Haroldine Laws. 10. Hyponatremia: Monitor with daily checks.              Sodium 132 on 1/14 and 1/15, 131 on 1/16  Continue to monitor 11. Leucocytosis: Resolved  Monitor for signs of infection and for fevers.              Continue to monitor 12. A fib: Monitor HR tid. On Pacerone 200 mg bid and coumadin.   Monitor with increased exertion 13. Aspiration PNA: Completed 10 day course of ceftriaxone on 1/10 14. Chest wall pain: Continue Lidoderm  patches with tramadol prn.  15. VT arrest s/p ICD 1/09: On pacer precautions.  16. T2DM with hyperglycemia: Monitor BS ac/hs. on glimepiride at home.  Continue SSI for now.   Labile on 1/14, monitor for trend             Monitor  with increased mobility 17. Ulcerative colitis: On mesalamine--has been refusing Rx-->daughter to bring her med from home as this is better tolerated.  18. Constipation:  Has not had BM since VT per family--added miralax for now and d/c once mesalamine on board (as had diarrhea SE)    Bowel meds decreased on 1/14             Adjust bowel meds as necessary  1/15: mag citrate ordered for constipation that is contributing to nausea and probably worsening hemorrhoids 19. Anemia of chronic disease:    Fareheme per nephrology.   Monitor H/H with serial checks.               Hemoglobin 8.9 on 1/13, labs ordered for Monday  Continue to monitor 20.  Nausea and vomiting             Zofran as needed             See #18             Overall improving, see #18 21.  Hypoalbuminemia  Supplement initiated on 1/13 22. Painful hemorroids: Anusol and witch hazel pads added.  [place] dreams bothersome to her: cannot give Prazosin since BP soft, discussed increasing Melatonin to 5mg  and she is agreeable  1/16: sleep still disrupted, increase melatonin to 10mg . 24. Low TSH and high free T4: has been started on methimazole, should have outpatient f/u with endocrinology   LOS: 4 days A FACE TO FACE EVALUATION WAS PERFORMED  Cynthia Hansen P Cynthia Hansen 06/20/2020, 11:01 AM

## 2020-06-20 NOTE — Progress Notes (Signed)
Patient unable to void after several attempts on bedpan, refuses to allow staff to get her up to [facility] stating" had a horrible time on yesterday when I got up I became very dizzy and sick". I/O cath for 650 cc urine, tolerated well.

## 2020-06-20 NOTE — Progress Notes (Signed)
Peripheral IV placed this morning by IV team, currently infiltrated. IV team assessed pt to see if they can place another peripheral IV, unable to locate good vein on right arm. Pt has left arm restricted due to ICD placed on left chest. IV team mentioned that pt is NOT a candidate for midline or PICC line due to renal status. Order received from Dr. Ranell Patrick to insert peripheral IV on left arm if able to find good vein. IV team consulted again.    Gerald Stabs, RN

## 2020-06-20 NOTE — Progress Notes (Signed)
Approached by assigned NTChalmers Guest) that patient verbalized she wanted something to sleep because she had not slept all night and was waiting on her nurse to bring her her sleep medication, Upon following up on patient concern this writer assured this patient that she had received her [place] medication at schedule time, and was informed if she needed additional medication  for sleep that she has an order..Patient replied okay and that she was just tired from the bad day she had and would probably sleep.. Reassurance provided   [phone] Patient assessed and appears to be asleep, name was called ,no response, respiration even and unlabored, coloration adequate, Continue to monitor

## 2020-06-20 NOTE — Progress Notes (Signed)
Advanced Heart Failure Rounding Note  PCP-Cardiologist: Larae Grooms, MD   Subjective:    Fatigued this am. Says she was up most of the night. Had urinary retention and then also early am blood draw was difficult. Feels weak. Denies SOB.   Diuretics have been on hold. Creatinine 2.8>3.0>3.3>3.3> 3.6. BUN 122  Weight up 178 - > 181   Objective:   Weight Range: 82.4 kg Body mass index is 27.62 kg/m.   Vital Signs:   Temp:  [97.6 F (36.4 C)-98 F (36.7 C)] 97.7 F (36.5 C) (01/16 0919) Pulse Rate:  [65-85] 85 (01/16 0919) Resp:  [16-20] 20 (01/16 0919) BP: (95-104)/(52-67) 95/63 (01/16 0919) SpO2:  [99 %-100 %] 99 % (01/16 0919) Weight:  [82.4 kg] 82.4 kg (01/16 0500) Last BM Date: 06/19/20  Weight change: Filed Weights   06/18/20 0500 06/19/20 0557 06/20/20 0500  Weight: 83.1 kg 81 kg 82.4 kg    Intake/Output:   Intake/Output Summary (Last 24 hours) at 06/20/2020 1041 Last data filed at 06/20/2020 0650 Gross per 24 hour  Intake 100 ml  Output 650 ml  Net -550 ml      Physical Exam    General:  Lying in bed  Weak appearing No resp difficulty HEENT: normal Neck: supple.JVP 9-10 Carotids 2+ bilat; no bruits. No lymphadenopathy or thryomegaly appreciated. Cor: PMI nondisplaced. Regular rate & rhythm. No rubs, gallops or murmurs. Lungs: clear Abdomen: soft, nontender, nondistended. No hepatosplenomegaly. No bruits or masses. Good bowel sounds. Extremities: no cyanosis, clubbing, rash, edema diffuse ecchymosis Neuro: fatigued but oriented , cranial nerves grossly intact. moves all 4 extremities w/o difficulty. Affect pleasant   Labs    CBC Recent Labs    06/20/20 0458  WBC 13.4*  HGB 8.9*  HCT 27.5*  MCV 96.8  PLT 852   Basic Metabolic Panel Recent Labs    06/19/20 0549 06/20/20 0458  NA 132* 131*  K 4.8 5.0  CL 98 97*  CO2 19* 19*  GLUCOSE 165* 143*  BUN 104* 122*  CREATININE 3.32* 3.61*  CALCIUM 8.5* 8.2*  MG 2.7* 3.2*   Liver  Function Tests No results for input(s): AST, ALT, ALKPHOS, BILITOT, PROT, ALBUMIN in the last 72 hours. No results for input(s): LIPASE, AMYLASE in the last 72 hours. Cardiac Enzymes No results for input(s): CKTOTAL, CKMB, CKMBINDEX, TROPONINI in the last 72 hours.  BNP: BNP (last 3 results) Recent Labs    05/27/20 0828 06/02/20 1832  BNP 2,213.1* 2,338.9*    ProBNP (last 3 results) Recent Labs    05/13/20 1550 05/31/20 0939  PROBNP 46,357* 48,555*     D-Dimer No results for input(s): DDIMER in the last 72 hours. Hemoglobin A1C No results for input(s): HGBA1C in the last 72 hours. Fasting Lipid Panel No results for input(s): CHOL, HDL, LDLCALC, TRIG, CHOLHDL, LDLDIRECT in the last 72 hours. Thyroid Function Tests No results for input(s): TSH, T4TOTAL, T3FREE, THYROIDAB in the last 72 hours.  Invalid input(s): FREET3  Other results:   Imaging    No results found.   Medications:     Scheduled Medications: . (feeding supplement) PROSource Plus  30 mL Oral BID BM  . acetaminophen  650 mg Oral TID  . amiodarone  200 mg Oral Daily  . atorvastatin  80 mg Oral Daily  . colestipol  1 g Oral BID  . hydrocortisone  25 mg Rectal BID  . insulin aspart  0-5 Units Subcutaneous QHS  . insulin aspart  0-9 Units Subcutaneous TID WC  . lidocaine  2 patch Transdermal Q24H  . melatonin  5 mg Oral QHS  . mesalamine  4.8 g Oral Q breakfast  . methimazole  5 mg Oral Daily  . midodrine  10 mg Oral TID WC  . polyethylene glycol  17 g Oral BID  . Ensure Max Protein  11 oz Oral BID  . senna-docusate  1 tablet Oral QHS  . warfarin  0.5 mg Oral ONCE-1600  . Warfarin - Pharmacist Dosing Inpatient   Does not apply q1600    Infusions: . sodium chloride 50 mL/hr at 06/20/20 0926  . ferumoxytol      PRN Medications: acetaminophen, alum & mag hydroxide-simeth, bisacodyl, diphenhydrAMINE, guaiFENesin-dextromethorphan, hydrocortisone cream, ondansetron (ZOFRAN) IV, polyethylene  glycol, prochlorperazine **OR** prochlorperazine **OR** prochlorperazine, sodium phosphate, traMADol, traZODone, witch hazel-glycerin    Assessment/Plan   1. Acute on chronic systolic CHF: Cardiolite in 11/21 with EF 26%, inferior fixed perfusion defect with no ischemia. Echo in 12/21 with EF 35-40%, moderate LV dilation, mildly decreased RV function. Cause of cardiomyopathy uncertain but there is concern for ischemic cardiomyopathy. Cardiac MRI was difficult study, EF 25-30% estimated without definitive LGE. Worsening CHF recently complicated by CKD stage IV. She has had NYHA class III symptoms. She diuresed well initially but had VT arrest. RHC this admit w/ mildly elevated filling pressures, mild pulmonary HTN and relatively preserved CO. Have been holding diuretics due to AKI. Renal function continues to deteriorate. Now mildly uremic - Bump in Scr from 2.88>>3.02>>3.3 -> 3.6.  BUN up to 122 - Ideally, would have coronary angiography to look for CAD as cause of cardiomyopathy and VT. However, not candidate at this time in absence of ACS given CKDAKI.  -Remains very tenuous with cardiorenal syndrome. She is not candidate for advanced HF therapies. - Now with ongoing Renal failure and early uremia. D/w Nephrology and not candidate for CVVHD or long-term HD. With advanced HF and renal failure do no think inotropes will change the outcome here. Agree with referral to Palliative Care.  2. AKI on CKD Stage IV: RHC showed relatively preserved cardiac output.SCr bump per above.  - Baseline creatinine 2.1-2.2 - Renal function continues to deteriorate 2.88>>3.02>>3.3 > 3.6 BUN 104 -> 122 Diuretics on hold. Volume status mildly elevated/ K 5.0 - c/wmidodrine 10 mg tid keep SBP >198for renal perfusion - Nephrology following as well. Discussion as abov  3. Chronic atrial fibrillation: This appears permanent, x years. Has biatrial severe enlargement and unlikely to hold NSR with DCCV attempt. INR  stable at 2.3 today - Rate controlled. Continue warfarin. Dosing per pharmacy.   4. Low TSH: Concern for hyperthyroidism. Free T4 is high, but free T3 is normal. Thyroid stimulating antibody negative  - Discussed abnormal TFTs with endocrinology, started on low dose methimazole 5 mg qam followup with endocrinology as outpatient.  5. CAD: NSTEMI with HS-TnI up to 7990 post-VT arrest and CPR. As above, have been concerned for CAD as cause of cardiomyopathy. ?Rise in troponin due to demand ischemia versus ACS =>no ischemic pain (just chest wall tenderness post-CPR) and no ECG change (old ASMI as in past).  - Continue atorvastatin 80 mg daily.  - INR threapeutic at 2.3.  - Ideally, would do coronary angiography but renal dysfunction prhibits - cMRI w/ poor imaging, Very difficult delayed enhancement images. Possible subtle subendocardial LGE in the mid inferior wall seen on the short axis images, but this could not be confirmed in the long axis  images. There does not appear to be a large amount of scar.  6. Cardiac arrest: Pulseless VT. Electrolytes were stable. No chest pain or dyspnea prior. ?Cause =>suspect scar-mediated.  - Quiescent on amio 200 daily (cut back due to nausea) - S/p ICD placement 1/9.   7. Pulmonary: RLL PNA post-arrest by CXR. Suspect aspiration.  - Resolved. Completed course of ceftriaxone. AF. WBC normal   8. Pleural Effusion: S/P R Thoracentesis with 700 cc removed. Transudate from CHF. - repeat CXR 1/10 w/ small bilateral pleural effusions   9. Hyponatremia: improving, Na 131  10. Anemia: Likely Anemia of chronic disease from CKD. No overt GI bleeding but has Fe deficiency.  - FOBT negative.  - Received 2 doses of feraheme.    11 Deconditioning - now in CIR    I spoke with Nephrology personally and I called her daughter by phone to update her.  Length of Stay: [place], MD  06/20/2020, 10:41 AM  Advanced Heart Failure  Team Pager [phone] (M-F; 7a - 4p)  Please contact [facility] for night-coverage after hours (4p -7a ) and weekends on [email or web address]

## 2020-06-20 NOTE — Progress Notes (Signed)
[facility] Progress Note   Assessment/ Plan:   1. AKI on CKD Stage IV - Baseline appears 1.8-2.3. AKI 2/2 ATN w/ arrest but also related to CHF and relative hypotension leading to decrease perfusion. Now uremic. At the moment, she is not a candidate for renal replacement therapy especially given her underlying cardiac condition/hypotension and poor functionality, risks outweigh benefits -had an extensive discussion with patient at the bedside and her daughter Abigail Butts (over the phone) this morning. We discussed symptoms and how these are all consistent with uremia/poison build up. We discussed the issues entailing her candidacy in regards to renal replacement therapy. They are in agreement in regards to discussing with palliative care. They are familiar with palliative care as they have had to go through this with her husband. Consult placed. Abigail Butts was expecting this to happen but is sooner than expected -seems to be clinically dry, has not eaten and has been vomiting, trial ns 50cc/hr for a total of 250-500cc. - midodrine 10mg  tid - Continue holding Farxiga and ACEI/ARB -continue to hold torsemide 2. Uremia, see above. Palliative care consulted to assist with symptom management 3. Acute systolic CHF w/ cardiac arrest: TTE 12/21 with EF 35% - 40% - AHF following. Appreciate recommendations and discussions - s/p ICD 1/9 -hold torsemide as above 4. Iron Deficiency anemia: CKD may be contributing. S/p feraheme x1 and redose next week. 5. Hyponatremia: improved 6. Nutrition:Low sodium diet   Dispo: CIR, poor prognosis, palliative care consulted, not a dialysis candidate (see above), possible transition to comfort care  Subjective:   Cynthia Hansen is a 80 y.o. with PMH of Afib, DM II, HTN, h/o CEA and new dx of dilated cardiomyopathy with EF 30-35% who is seen in consultation for AKI on CKD. She is on hospital day 4.  Now with dysgeusia, loss of appetite, brain fog, persistent  nausea, recurrent vomiting. Had urinary retention, I/o 600cc. Fatgiued   Objective:   BP 95/63 (BP Location: Left Arm)   Pulse 85   Temp 97.7 F (36.5 C)   Resp 20   Ht 5\' 8"  (1.727 m)   Wt 82.4 kg   SpO2 99%   BMI 27.62 kg/m   Physical Exam: NOM:VEHMC in bed, NAD CVS: normal rate, no murmur  Resp: Normal work of breathing , bilateral chest rise Abd: Soft, NT Ext: No edema Neuro: no asterixis, globally weak and fatigued  Labs: BMET Recent Labs  Lab 06/14/20 0050 06/15/20 0036 07/03/2020 0039 06/17/20 0507 06/18/20 0527 06/19/20 0549 06/20/20 0458  NA 133* 133* 132* 134* 132* 132* 131*  K 4.3 4.4 4.4 4.5 4.4 4.8 5.0  CL 101 98 98 99 98 98 97*  CO2 19* 23 21* 21* 20* 19* 19*  GLUCOSE 255* 215* 232* 163* 143* 165* 143*  BUN 79* 82* 89* 91* 99* 104* 122*  CREATININE 2.72* 2.73* 2.88* 3.01* 3.31* 3.32* 3.61*  CALCIUM 8.3* 8.6* 8.4* 8.5* 8.5* 8.5* 8.2*   CBC Recent Labs  Lab 06/14/20 0050 06/15/20 0036 06/17/20 0507 06/20/20 0458  WBC 11.6* 11.7* 9.5 13.4*  NEUTROABS  --   --  7.4  --   HGB 8.6* 8.6* 8.9* 8.9*  HCT 28.8* 28.7* 29.6* 27.5*  MCV 97.6 97.3 100.7* 96.8  PLT 287 312 319 345      Medications:    . (feeding supplement) PROSource Plus  30 mL Oral BID BM  . acetaminophen  650 mg Oral TID  . amiodarone  200 mg Oral Daily  .  atorvastatin  80 mg Oral Daily  . colestipol  1 g Oral BID  . hydrocortisone  25 mg Rectal BID  . insulin aspart  0-5 Units Subcutaneous QHS  . insulin aspart  0-9 Units Subcutaneous TID WC  . lidocaine  2 patch Transdermal Q24H  . melatonin  10 mg Oral QHS  . mesalamine  4.8 g Oral Q breakfast  . methimazole  5 mg Oral Daily  . midodrine  10 mg Oral TID WC  . polyethylene glycol  17 g Oral BID  . Ensure Max Protein  11 oz Oral BID  . senna-docusate  1 tablet Oral QHS  . warfarin  0.5 mg Oral ONCE-1600  . Warfarin - Pharmacist Dosing Inpatient   Does not apply [id number]     ACGBK ORJGY  06/20/2020, 11:33 AM

## 2020-06-20 NOTE — Progress Notes (Signed)
Dear Doctor: This patient has been identified as a candidate for CVC for the following reason (s): multiple PIV, infiltration, restricted L arm, not candidate for midline or PICC d/t renal function. If you agree, please write an order for the indicated device.   Thank you for supporting the early vascular access assessment program.

## 2020-06-20 NOTE — Progress Notes (Signed)
[facility] for  warfarin Indication: atrial fibrillation  Allergies  Allergen Reactions  . Avandia [Rosiglitazone] Other (See Comments)    Causes muscle weakness.  . Latex Rash  . Sulfa Antibiotics Rash    Patient Measurements: Height: 5\' 8"  (172.7 cm) Weight: 82.4 kg (181 lb 10.5 oz) IBW/kg (Calculated) : 63.9 Heparin Dosing Weight: 79.3 kg  Vital Signs: Temp: 97.6 F (36.4 C) (01/16 0520) Temp Source: Oral (01/16 0520) BP: 101/52 (01/16 0520) Pulse Rate: 78 (01/16 0520)  Labs: Recent Labs    06/18/20 0527 06/19/20 0549 06/20/20 0458  HGB  --   --  8.9*  HCT  --   --  27.5*  PLT  --   --  345  LABPROT 23.3* 24.1* 24.3*  INR 2.2* 2.2* 2.3*  CREATININE 3.31* 3.32* 3.61*    Estimated Creatinine Clearance: 14.5 mL/min (A) (by C-G formula based on SCr of 3.61 mg/dL (H)).   Medical History: Past Medical History:  Diagnosis Date  . A-fib ([place])   . Acute on chronic combined systolic (congestive) and diastolic (congestive) heart failure ([place])   . Carotid artery occlusion   . DCM (dilated cardiomyopathy) ([place])    EF 30-35% by echo 05/2020  . Diabetes mellitus   . Hyperlipidemia   . Irregular heart beat   . Vitamin D deficiency      Assessment: [name] on warfarin for afib PTA now holding warfarin for [facility]. Pharmacy consulted to start heparin when INR <2. Admit INR 2.8> down to 1.8.  Heparin started > heparin stopped post cath. Resumed warfarin on 1/11.  Today's INR stable at 2.3, therapeutic. CBC stable.   Warfarin PTA dose: 1.5mg  Fridays, 1mg  all other days (Last seen 05/26/20 by [facility]).   Goal of Therapy:  INR 2-3 Monitor platelets by anticoagulation protocol: Yes   Plan:  -Coumadin 0.5 mg x 1 - will dose with caution, appears sensitivity increased with amiodarone -Daily INR, CBC  Fara Olden, PharmD PGY-1 Pharmacy Resident 06/20/2020 8:09 AM Please see AMION for all pharmacy numbers

## 2020-06-21 ENCOUNTER — Inpatient Hospital Stay (HOSPITAL_COMMUNITY): Payer: PPO

## 2020-06-21 ENCOUNTER — Inpatient Hospital Stay (HOSPITAL_COMMUNITY): Payer: PPO | Admitting: Occupational Therapy

## 2020-06-22 MED FILL — Medication: Qty: 1 | Status: AC

## 2020-06-24 ENCOUNTER — Ambulatory Visit: Payer: PPO

## 2020-07-06 NOTE — Progress Notes (Signed)
I received a call from nursing staff regarding Code Blue. Patient had received urinary catheterization at 4:15am and was alert at the time. She was found to be in asystole at 4:55am. Daughter requested that CPR not be continued and patient has passed. I have called to discuss with daughter. She had a conversation with Dr. Haroldine Laws yesterday about patient's condition and recommendation for palliative care involvement. She was aware that her mother was very sick but tearful that she was not present this morning due to the snow as she has been present with her mother every day. Provided support.

## 2020-07-06 NOTE — Code Documentation (Addendum)
  Patient Name: Cynthia Hansen   MRN: [id number]   Date of Birth/ Sex: Sep 20, 1940 , female      Admission Date: 07/03/2020  Attending Provider: Jamse Arn, MD  Primary Diagnosis: Debility [R53.81]   Indication: Pt was in her usual state of health until this AM, when she was noted to be unresponsive. Code blue was subsequently called. At the time of arrival on scene, ACLS protocol was underway.   Technical Description:  - CPR performance duration:  12 minutes  - Was defibrillation or cardioversion used? No   - Was external pacer placed? Yes  - Was patient intubated pre/post CPR? No   Medications Administered: Y = Yes; Blank = No Amiodarone    Atropine    Calcium  Y  Epinephrine  Y  Lidocaine    Magnesium    Norepinephrine    Phenylephrine    Sodium bicarbonate  Y  Vasopressin    Other    Post CPR evaluation:  - Final Status - Was patient successfully resuscitated ? No   Miscellaneous Information:  - Time of death:  08-Jul-2020 5:07 AM  - Primary team notified?  Yes. Dr Adam Phenix informed.   - Family Notified? Yes. Patient's daughter contacted during code, who requested no intubation. CPR continued for additional 2 minutes and CPR discontinued at that time, per patient's daughter.      Jose Persia, MD   July 08, 2020, 5:12 AM

## 2020-07-06 NOTE — Progress Notes (Signed)
Chaplain responded to Code Blue. CPR in progress. Physician on phone with patient's daughter who asked that CPR not be continued. Patient passed. Chaplain is available when needed.   2020/06/24 0500  Clinical Encounter Type  Visit Type Code  Referral From Nurse  Consult/Referral To Chaplain  Stress Factors  Family Stress Factors Loss

## 2020-07-06 NOTE — Progress Notes (Signed)
Patient I/O cathed at 0330am, patient alert. Patient requested ice chips at 0405am. Patient left with call bell within reach. Patient found unresponsive by lab at 0450am. Code blue initiated, Dr Adam Phenix notified. Per daughter request to not intubate and stop compressions.

## 2020-07-06 NOTE — Death Summary Note (Signed)
DEATH SUMMARY   Patient Details  Name: Cynthia Hansen MRN: [id number] DOB: 16-Jul-1940  Admission/Discharge Information   Admit Date:  2020/06/30  Date of Death: Date of Death: 2020-07-05  Time of Death: Time of Death: 0507  Length of Stay: 5  Referring Physician: Aretta Nip, MD   Reason(s) for Hospitalization  Debility.   Diagnoses  Preliminary cause of death:  Secondary Diagnoses (including complications and co-morbidities):  Principal Problem:   Debility Active Problems:   Hypoalbuminemia due to protein-calorie malnutrition ([place])   Controlled type 2 diabetes mellitus with hyperglycemia, without long-term current use of insulin (HCC)   Non-intractable vomiting   Chronic kidney disease (CKD), stage IV (severe) (HCC)   Acute on chronic systolic (congestive) heart failure (HCC)   Anemia of chronic disease   Slow transit constipation   Hyponatremia   Brief Hospital Course (including significant findings, care, treatment, and services provided and events leading to death)  Cynthia Hansen is a 80 y.o. year old female with history of A fib-on coumadin, T2DM, ulcerative colitis, DDD, recent diagnosis of CHF who was originally admitted on 06/02/20 with marked LLE edema due to severe systolic CHF and acute on chronic renal failure. She was treated with IV diuresis with plans for cardiac cath. This was deferred as on 06-19-2020, she coded due to pulseless VT requiring CPR with ROSC after 4 minutes. She was treated with IV antibiotics due to concerns of aspiration PNA and underwent right thoracocentesis of 700 cc clear yellow fluid with improvement of respiratory status. Nephrology was following to assist with management of acute on chronic renal failure. She underwent ICD placement on 01/09 by Dr. Lovena Le.  She continued to require midodrine for BP support and cardiac medications were adjusted to manage fluid status. She continued to be limited by weakness with hypotension as well as  debility. CIR was recommended due to functional decline.   She was admitted for intensive rehab program on July 01, 2019. She received second dose fareheme for iron deficiency disease. Cardiology was following for input and diuretics held due to low BP with worsening of renal status and progressive hyponateremia. Her intake remained poor with ongoing issue with N/V and constipation.  Bowel program adjusted to manage constipation and supplements added to help with low calorie malnutrition. She continued to have issues with anorexia, weakness and poor activity tolerance felt to be due to uremia. Nephrology did not feel that patient was a candidate for CRRT or HD and recommended palliative consult to help determine GOC. On early am of 07-05-2022, patient was found to unresponsive with asystole and code blue initiated with CPR/ACLS  X 12 minutes. Family requested that patient not be intubated and she expired at 5:07am.    Pertinent Labs and Studies  Significant Diagnostic Studies prior to CIR admission: DG Chest 1 View  Result Date: 06/08/2020 CLINICAL DATA:  Post right thoracentesis EXAM: CHEST  1 VIEW COMPARISON:  06-19-20 FINDINGS: Decreased right pleural effusion following thoracentesis. No pneumothorax. Improved aeration in the right lung base with persistent airspace disease. Small left pleural effusion.  Negative for pulmonary edema. IMPRESSION: No complication post right thoracentesis. Improved aeration right lower lobe. Electronically Signed   By: Franchot Gallo M.D.   On: 06/08/2020 10:33   DG Chest 2 View  Result Date: 06/14/2020 CLINICAL DATA:  Evaluate ICD placement. EXAM: CHEST - 2 VIEW COMPARISON:  06/08/2020 FINDINGS: Interval placement of left ICD. ICD lead is in the right ventricle. No pneumothorax identified. Stable cardiac enlargement.  Aortic atherosclerosis. No change in small bilateral pleural effusions. IMPRESSION: Interval placement of left ICD. No complications. Electronically Signed   By:  Kerby Moors M.D.   On: 06/14/2020 07:22   DG Chest 2 View  Result Date: 06/02/2020 CLINICAL DATA:  Shortness of breath. EXAM: CHEST - 2 VIEW COMPARISON:  May 13, 2020. FINDINGS: Stable cardiomegaly. No pneumothorax is noted. Mild to moderate right pleural effusion is noted with associated right basilar atelectasis or infiltrate. Minimal left basilar subsegmental atelectasis is noted. Bony thorax is unremarkable. IMPRESSION: Mild to moderate right pleural effusion is noted with associated right basilar atelectasis or infiltrate. Aortic Atherosclerosis (ICD10-I70.0). Electronically Signed   By: Marijo Conception M.D.   On: 06/02/2020 19:24   DG Chest Right Decubitus  Result Date: 06/08/2020 CLINICAL DATA:  Pleural effusion EXAM: CHEST - RIGHT DECUBITUS COMPARISON:  06/05/2020 FINDINGS: Following right thoracentesis, right lateral decubitus view was obtained. Small right pleural effusions present. Right lower lobe airspace disease is present. Left lung appears clear. IMPRESSION: Small layering right effusion. Mild right lower lobe airspace disease. Electronically Signed   By: Franchot Gallo M.D.   On: 06/08/2020 10:35   CARDIAC CATHETERIZATION  Result Date: 06/07/2020 1. Mildly elevated filling pressures 2. Pulmonary venous hypertension (mild) 3. Relatively preserved cardiac output  US RENAL  Result Date: 06/09/2020 CLINICAL DATA:  80 year old female with acute renal insufficiency. EXAM: RENAL / URINARY TRACT ULTRASOUND COMPLETE COMPARISON:  None. FINDINGS: Right Kidney: Renal measurements: 9.9 x 4.6 x 4.8 cm = volume: 113 mL. Moderate parenchyma atrophy. Normal echogenicity. No hydronephrosis or shadowing stone. Left Kidney: Renal measurements: 9.2 x 3.7 x 3.9 cm = volume: 70 mL. The left kidney is atrophic and poorly visualized. There is increased renal parenchyma atrophy. Evaluation of the kidney is very limited due to poor visualization. Bladder: Appears normal for degree of bladder distention.  Other: None. IMPRESSION: 1. Moderate right renal parenchyma atrophy. No hydronephrosis or shadowing stone. 2. Atrophic and poorly visualized left kidney. Electronically Signed   By: Anner Crete M.D.   On: 06/09/2020 18:29   EP PPM/ICD IMPLANT  Result Date: 06/13/2020  CONCLUSIONS:  1. Non-ischemic cardiomyopathy with chronic New York Heart Association class II heart failure.  2. Successful ICD implantation.  3. No early apparent complications. ICD Criteria Current LVEF:35%. Within 12 months prior to implant: Yes Heart failure history: Yes, Class II Cardiomyopathy history: Yes, Non-Ischemic Cardiomyopathy. Atrial Fibrillation/Atrial Flutter: Yes, Permanent. Ventricular tachycardia history: Yes, Hemodynamic instability present. VT Type: Sustained Ventricular Tachycardia - Monomorphic. Cardiac arrest history: Yes, Ventricular Tachycardia. History of syndromes with risk of sudden death: No. Previous ICD: No. Current ICD indication: Secondary PPM indication: No.  Class I or II Bradycardia indication present: No Beta Blocker therapy for 3 or more months: Yes, prescribed. Ace Inhibitor/ARB therapy for 3 or more months: No, medical reason. Cristopher Peru, MD 11:24 AM 06/13/2020   DG CHEST PORT 1 VIEW  Result Date: 06/05/2020 CLINICAL DATA:  Post code EXAM: PORTABLE CHEST 1 VIEW COMPARISON:  06/02/2020 FINDINGS: Shallow inspiration. Heart size is enlarged. There is mild vascular congestion with mild perihilar infiltration or edema. Consolidation and effusion in the right lung base similar to prior study. No pneumothorax or pneumomediastinum. Calcification of the aorta. IMPRESSION: Cardiac enlargement with mild vascular congestion and perihilar edema. Consolidation and effusion in the right lung base. Electronically Signed   By: Lucienne Capers M.D.   On: 06/05/2020 18:40   MR CARDIAC MORPHOLOGY W WO CONTRAST  Result Date: 06/07/2020  CLINICAL DATA:  Cardiomyopathy of uncertain etiology EXAM: CARDIAC MRI TECHNIQUE:  The patient was scanned on a 1.5 Tesla GE magnet. A dedicated cardiac coil was used. Functional imaging was done using Fiesta sequences. 2,3, and 4 chamber views were done to assess for RWMA's. Modified Simpson's rule using a short axis stack was used to calculate an ejection fraction on a dedicated work Conservation officer, nature. The patient received 8 cc of Gadavist. After 10 minutes inversion recovery sequences were used to assess for infiltration and scar tissue. FINDINGS: Limited images of the lung fields showed a moderate right pleural effusion. Difficult images due to atrial fibrillation and respiratory artifact. Normal left ventricular size and wall thickness. Unable to quantify LV systolic function due to respiratory artifact. Would estimate LV EF 25-30% range. Diffuse hypokinesis, worse in the inferior wall. The right ventricle looks normal in size with mild-moderate systolic dysfunction. Moderate left and right atrial enlargement. Trileaflet aortic valve, no significant stenosis or regurgitation. There does not appear to be more than mild mitral regurgitation. Difficult delayed enhancement images. In the short axis images, there was concern for possible subtle mid inferior wall subendocardial late gadolinium enhancement (LGE) but this was not seen in the long axis images. IMPRESSION: 1. Very difficult images due to respiratory artifact and atrial fibrillation. 2. Due to poor images, unable to quantify LV EF. Estimate EF 25-30%, diffuse hypokinesis looking worse in the inferior wall. 3.  Normal RV size with mild-moderately decreased systolic function. 4. Very difficult delayed enhancement images. Possible subtle subendocardial LGE in the mid inferior wall seen on the short axis images, but this could not be confirmed in the long axis images. There does not appear to be a large amount of scar. Dalton Mclean Electronically Signed   By: Loralie Champagne M.D.   On: 06/07/2020 17:11   IR THORACENTESIS ASP  PLEURAL SPACE W/IMG GUIDE  Result Date: 06/08/2020 INDICATION: Patient with recent pulseless VT requiring shock and CPR; history of persistent atrial fibrillation. Patient has now developed pleural effusions. Interventional Radiology asked to perform a diagnostic and therapeutic thoracentesis. EXAM: ULTRASOUND GUIDED THORACENTESIS MEDICATIONS: 1% lidocaine 20 mL COMPLICATIONS: None immediate.  No pneumothorax on follow-up radiograph. PROCEDURE: An ultrasound guided thoracentesis was thoroughly discussed with the patient and questions answered. The benefits, risks, alternatives and complications were also discussed. The patient understands and wishes to proceed with the procedure. Written consent was obtained. Ultrasound was performed to localize and mark an adequate pocket of fluid in the right chest. The area was then prepped and draped in the normal sterile fashion. 1% Lidocaine was used for local anesthesia. Under ultrasound guidance a 6 Fr Safe-T-Centesis catheter was introduced. Thoracentesis was performed. The catheter was removed and a dressing applied. FINDINGS: A total of approximately 700 mL of clear yellow fluid was removed. Samples were sent to the laboratory as requested by the clinical team. IMPRESSION: Successful ultrasound guided right thoracentesis yielding 700 mL of pleural fluid. Read by: Soyla Dryer, NP Electronically Signed   By: Lucrezia Europe M.D.   On: 06/08/2020 11:30    Microbiology Recent Results (from the past 240 hour(s))  Surgical PCR screen     Status: None   Collection Time: 06/12/20  4:30 PM   Specimen: Nasal Mucosa; Nasal Swab  Result Value Ref Range Status   MRSA, PCR NEGATIVE NEGATIVE Final   Staphylococcus aureus NEGATIVE NEGATIVE Final    Comment: (NOTE) The Xpert SA Assay (FDA approved for NASAL specimens in patients 22 years  of age and older), is one component of a comprehensive surveillance program. It is not intended to diagnose infection nor to guide or  monitor treatment. Performed at [facility], [place] [address], [place], [place] [address]     Lab Basic Metabolic Panel: Recent Labs  Lab 06/20/2020 0039 06/17/20 0507 06/18/20 0527 06/19/20 0549 06/20/20 0458  NA 132* 134* 132* 132* 131*  K 4.4 4.5 4.4 4.8 5.0  CL 98 99 98 98 97*  CO2 21* 21* 20* 19* 19*  GLUCOSE 232* 163* 143* 165* 143*  BUN 89* 91* 99* 104* 122*  CREATININE 2.88* 3.01* 3.31* 3.32* 3.61*  CALCIUM 8.4* 8.5* 8.5* 8.5* 8.2*  MG 2.6* 2.8* 2.9* 2.7* 3.2*   Liver Function Tests: Recent Labs  Lab 06/17/20 0507  AST 13*  ALT 11  ALKPHOS 104  BILITOT 0.6  PROT 5.9*  ALBUMIN 2.7*   No results for input(s): LIPASE, AMYLASE in the last 168 hours. No results for input(s): AMMONIA in the last 168 hours. CBC: Recent Labs  Lab 06/15/20 0036 06/17/20 0507 06/20/20 0458  WBC 11.7* 9.5 13.4*  NEUTROABS  --  7.4  --   HGB 8.6* 8.9* 8.9*  HCT 28.7* 29.6* 27.5*  MCV 97.3 100.7* 96.8  PLT 312 319 345   Cardiac Enzymes: No results for input(s): CKTOTAL, CKMB, CKMBINDEX, TROPONINI in the last 168 hours. Sepsis Labs: Recent Labs  Lab 06/15/20 0036 06/17/20 0507 06/20/20 0458  WBC 11.7* 9.5 13.4*    Procedures/Operations  N/A   Bary Leriche 2020/07/11, 9:47 AM

## 2020-07-06 DEATH — deceased

## 2020-09-16 ENCOUNTER — Encounter: Payer: PPO | Admitting: Cardiology

## 2020-09-16 ENCOUNTER — Encounter: Payer: PPO | Admitting: Internal Medicine

## 2022-07-15 IMAGING — CR DG CHEST 2V
2 series · 2 of 2 positions shown · non-contrast
Comparison: Chest radiograph 04/12/2007.

CLINICAL DATA: Permanent atrial fibrillation. Decreased cardiac
ejection fraction. Bilateral carotid artery stenosis. Hypertensive
heart disease without heart failure. Type 2 diabetes mellitus with
other circulatory complication, without long-term current use of
insulin. Shortness of breath.

EXAM:
CHEST - 2 VIEW

[w chest lat]
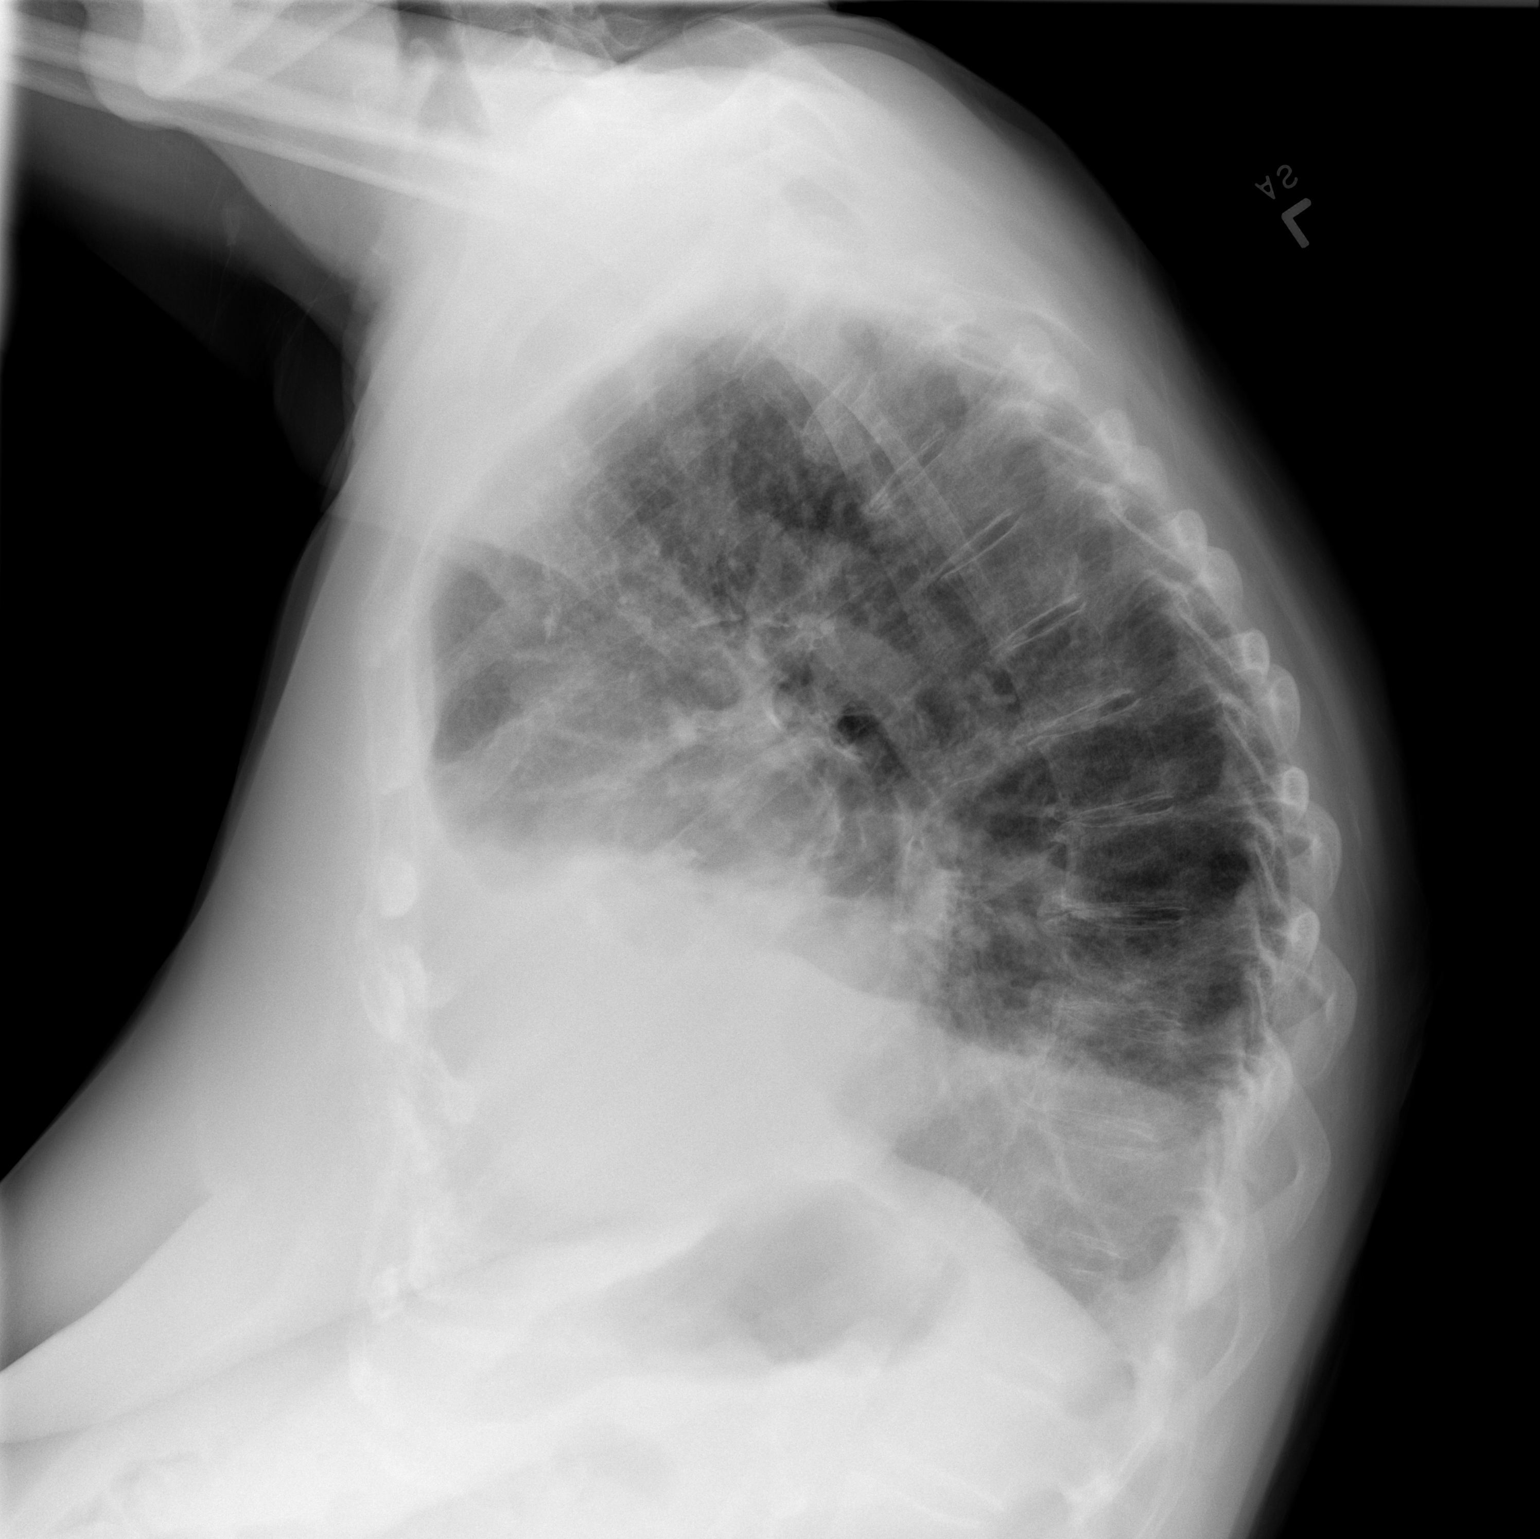

[w chest ap]
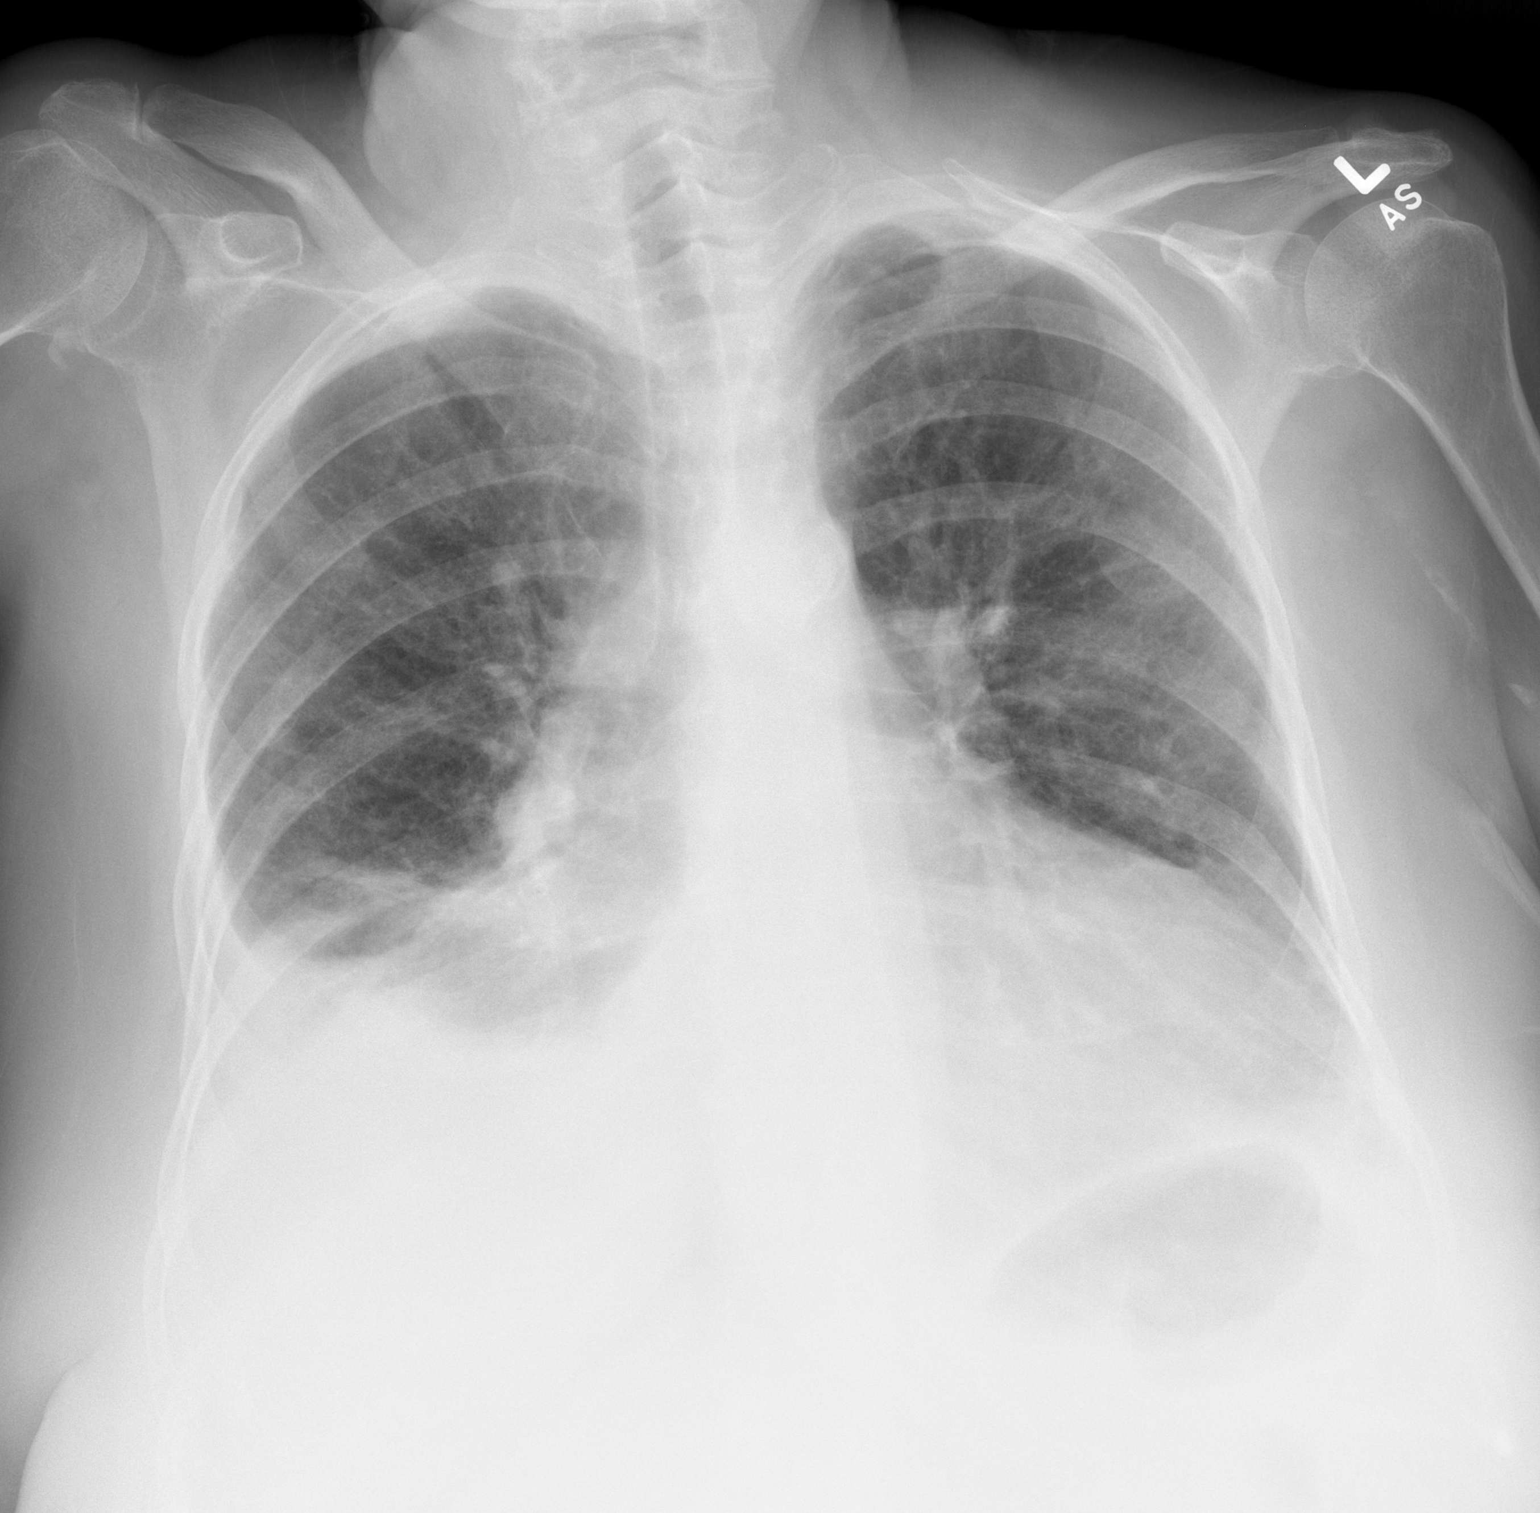

[2 of 2 positions shown; findings below may reference images not displayed]

FINDINGS: Cardiomegaly. Aortic atherosclerosis. Bilateral pleural effusions
(small right, trace left). Associated right basilar atelectasis.
There is central pulmonary vascular congestion and mild bilateral
interstitial prominence likely reflects interstitial edema. No
evidence of pneumothorax. No acute bony abnormality identified.
IMPRESSION: Cardiomegaly with central pulmonary vascular congestion and probable
mild interstitial edema.

Bilateral pleural effusions (small right, trace left). Associated
right basilar atelectasis.

Aortic Atherosclerosis ([id number]).
